# Patient Record
Sex: Female | Born: 1935 | Race: Black or African American | Hispanic: No | Smoking: Former smoker
Health system: Southern US, Community
[De-identification: ages and names within clinical notes are randomized; demographics above are authoritative.]

## PROBLEM LIST (undated history)

## (undated) DIAGNOSIS — F419 Anxiety disorder, unspecified: Secondary | ICD-10-CM

## (undated) DIAGNOSIS — Z5189 Encounter for other specified aftercare: Secondary | ICD-10-CM

## (undated) DIAGNOSIS — I10 Essential (primary) hypertension: Secondary | ICD-10-CM

## (undated) DIAGNOSIS — E785 Hyperlipidemia, unspecified: Secondary | ICD-10-CM

## (undated) DIAGNOSIS — R51 Headache: Secondary | ICD-10-CM

## (undated) DIAGNOSIS — K2970 Gastritis, unspecified, without bleeding: Secondary | ICD-10-CM

## (undated) DIAGNOSIS — J449 Chronic obstructive pulmonary disease, unspecified: Secondary | ICD-10-CM

## (undated) DIAGNOSIS — I2510 Atherosclerotic heart disease of native coronary artery without angina pectoris: Secondary | ICD-10-CM

## (undated) DIAGNOSIS — D649 Anemia, unspecified: Secondary | ICD-10-CM

## (undated) DIAGNOSIS — T7840XA Allergy, unspecified, initial encounter: Secondary | ICD-10-CM

## (undated) DIAGNOSIS — M1990 Unspecified osteoarthritis, unspecified site: Secondary | ICD-10-CM

## (undated) DIAGNOSIS — E876 Hypokalemia: Secondary | ICD-10-CM

## (undated) DIAGNOSIS — H269 Unspecified cataract: Secondary | ICD-10-CM

## (undated) DIAGNOSIS — D126 Benign neoplasm of colon, unspecified: Secondary | ICD-10-CM

## (undated) DIAGNOSIS — I5189 Other ill-defined heart diseases: Secondary | ICD-10-CM

## (undated) HISTORY — DX: Allergy, unspecified, initial encounter: T78.40XA

## (undated) HISTORY — DX: Headache: R51

## (undated) HISTORY — DX: Anemia, unspecified: D64.9

## (undated) HISTORY — DX: Unspecified cataract: H26.9

## (undated) HISTORY — DX: Benign neoplasm of colon, unspecified: D12.6

## (undated) HISTORY — PX: PARTIAL HYSTERECTOMY: SHX80

## (undated) HISTORY — DX: Encounter for other specified aftercare: Z51.89

## (undated) HISTORY — PX: CARPAL TUNNEL RELEASE: SHX101

## (undated) HISTORY — PX: UMBILICAL HERNIA REPAIR: SHX196

## (undated) HISTORY — PX: GALLBLADDER SURGERY: SHX652

## (undated) HISTORY — PX: OTHER SURGICAL HISTORY: SHX169

## (undated) HISTORY — DX: Chronic obstructive pulmonary disease, unspecified: J44.9

## (undated) HISTORY — DX: Essential (primary) hypertension: I10

## (undated) HISTORY — DX: Anxiety disorder, unspecified: F41.9

## (undated) HISTORY — DX: Unspecified osteoarthritis, unspecified site: M19.90

## (undated) HISTORY — DX: Hyperlipidemia, unspecified: E78.5

---

## 1998-05-23 ENCOUNTER — Ambulatory Visit (HOSPITAL_BASED_OUTPATIENT_CLINIC_OR_DEPARTMENT_OTHER): Admission: RE | Admit: 1998-05-23 | Discharge: 1998-05-23 | Payer: Self-pay | Admitting: Orthopedic Surgery

## 1998-07-09 ENCOUNTER — Ambulatory Visit (HOSPITAL_BASED_OUTPATIENT_CLINIC_OR_DEPARTMENT_OTHER): Admission: RE | Admit: 1998-07-09 | Discharge: 1998-07-09 | Payer: Self-pay | Admitting: Orthopedic Surgery

## 2001-01-11 ENCOUNTER — Ambulatory Visit (HOSPITAL_COMMUNITY): Admission: RE | Admit: 2001-01-11 | Discharge: 2001-01-11 | Payer: Self-pay | Admitting: Family Medicine

## 2001-01-11 ENCOUNTER — Encounter: Payer: Self-pay | Admitting: Family Medicine

## 2001-08-17 ENCOUNTER — Encounter: Payer: Self-pay | Admitting: Emergency Medicine

## 2001-08-17 ENCOUNTER — Inpatient Hospital Stay (HOSPITAL_COMMUNITY): Admission: EM | Admit: 2001-08-17 | Discharge: 2001-08-25 | Payer: Self-pay | Admitting: Emergency Medicine

## 2001-10-13 ENCOUNTER — Encounter (HOSPITAL_COMMUNITY): Admission: RE | Admit: 2001-10-13 | Discharge: 2001-11-12 | Payer: Self-pay | Admitting: Cardiology

## 2001-11-14 ENCOUNTER — Encounter (HOSPITAL_COMMUNITY): Admission: RE | Admit: 2001-11-14 | Discharge: 2001-12-14 | Payer: Self-pay | Admitting: Cardiology

## 2001-12-14 ENCOUNTER — Encounter (HOSPITAL_COMMUNITY): Admission: RE | Admit: 2001-12-14 | Discharge: 2002-01-13 | Payer: Self-pay | Admitting: Cardiology

## 2002-01-18 ENCOUNTER — Encounter (HOSPITAL_COMMUNITY): Admission: RE | Admit: 2002-01-18 | Discharge: 2002-02-17 | Payer: Self-pay | Admitting: Cardiology

## 2002-03-02 HISTORY — PX: CHOLECYSTECTOMY: SHX55

## 2002-09-20 ENCOUNTER — Inpatient Hospital Stay (HOSPITAL_COMMUNITY): Admission: EM | Admit: 2002-09-20 | Discharge: 2002-09-23 | Payer: Self-pay | Admitting: Emergency Medicine

## 2002-09-20 ENCOUNTER — Encounter: Payer: Self-pay | Admitting: Emergency Medicine

## 2002-09-20 ENCOUNTER — Encounter: Payer: Self-pay | Admitting: Family Medicine

## 2002-09-21 ENCOUNTER — Encounter: Payer: Self-pay | Admitting: *Deleted

## 2002-09-22 ENCOUNTER — Encounter: Payer: Self-pay | Admitting: General Surgery

## 2002-09-25 ENCOUNTER — Observation Stay (HOSPITAL_COMMUNITY): Admission: RE | Admit: 2002-09-25 | Discharge: 2002-09-26 | Payer: Self-pay | Admitting: General Surgery

## 2003-07-10 ENCOUNTER — Ambulatory Visit (HOSPITAL_COMMUNITY): Admission: RE | Admit: 2003-07-10 | Discharge: 2003-07-10 | Payer: Self-pay | Admitting: Family Medicine

## 2003-10-27 ENCOUNTER — Emergency Department (HOSPITAL_COMMUNITY): Admission: EM | Admit: 2003-10-27 | Discharge: 2003-10-27 | Payer: Self-pay | Admitting: Emergency Medicine

## 2003-10-27 ENCOUNTER — Inpatient Hospital Stay (HOSPITAL_COMMUNITY): Admission: EM | Admit: 2003-10-27 | Discharge: 2003-10-30 | Payer: Self-pay | Admitting: Cardiology

## 2003-11-13 ENCOUNTER — Encounter (HOSPITAL_COMMUNITY): Admission: RE | Admit: 2003-11-13 | Discharge: 2003-11-30 | Payer: Self-pay | Admitting: Cardiology

## 2003-12-03 ENCOUNTER — Encounter (HOSPITAL_COMMUNITY): Admission: RE | Admit: 2003-12-03 | Discharge: 2004-01-02 | Payer: Self-pay | Admitting: Cardiology

## 2004-02-06 ENCOUNTER — Encounter (HOSPITAL_COMMUNITY): Admission: RE | Admit: 2004-02-06 | Discharge: 2004-02-29 | Payer: Self-pay | Admitting: Cardiology

## 2004-02-15 ENCOUNTER — Ambulatory Visit: Payer: Self-pay | Admitting: Cardiology

## 2004-08-26 ENCOUNTER — Ambulatory Visit: Payer: Self-pay | Admitting: *Deleted

## 2004-09-01 ENCOUNTER — Ambulatory Visit: Payer: Self-pay | Admitting: *Deleted

## 2004-09-01 ENCOUNTER — Encounter (HOSPITAL_COMMUNITY): Admission: RE | Admit: 2004-09-01 | Discharge: 2004-10-01 | Payer: Self-pay | Admitting: *Deleted

## 2004-09-08 ENCOUNTER — Ambulatory Visit: Payer: Self-pay | Admitting: Cardiology

## 2004-10-20 ENCOUNTER — Ambulatory Visit: Payer: Self-pay | Admitting: *Deleted

## 2006-11-22 ENCOUNTER — Emergency Department (HOSPITAL_COMMUNITY): Admission: EM | Admit: 2006-11-22 | Discharge: 2006-11-22 | Payer: Self-pay | Admitting: Emergency Medicine

## 2007-01-04 ENCOUNTER — Ambulatory Visit: Payer: Self-pay | Admitting: Cardiology

## 2007-01-13 ENCOUNTER — Ambulatory Visit (HOSPITAL_COMMUNITY): Admission: RE | Admit: 2007-01-13 | Discharge: 2007-01-13 | Payer: Self-pay | Admitting: Family Medicine

## 2008-03-02 ENCOUNTER — Emergency Department (HOSPITAL_COMMUNITY): Admission: EM | Admit: 2008-03-02 | Discharge: 2008-03-02 | Payer: Self-pay | Admitting: Emergency Medicine

## 2008-03-12 ENCOUNTER — Ambulatory Visit: Payer: Self-pay | Admitting: Cardiology

## 2008-04-17 ENCOUNTER — Encounter (INDEPENDENT_AMBULATORY_CARE_PROVIDER_SITE_OTHER): Payer: Self-pay | Admitting: *Deleted

## 2008-04-17 LAB — CONVERTED CEMR LAB
Albumin: 4.2 g/dL
Alkaline Phosphatase: 61 units/L
BUN: 16 mg/dL
CO2: 22 meq/L
Chloride: 104 meq/L
Creatinine, Ser: 0.65 mg/dL
Potassium: 3.8 meq/L
Sodium: 140 meq/L

## 2008-05-18 ENCOUNTER — Ambulatory Visit (HOSPITAL_COMMUNITY): Admission: RE | Admit: 2008-05-18 | Discharge: 2008-05-18 | Payer: Self-pay | Admitting: Family Medicine

## 2008-07-30 ENCOUNTER — Emergency Department (HOSPITAL_COMMUNITY): Admission: EM | Admit: 2008-07-30 | Discharge: 2008-07-30 | Payer: Self-pay | Admitting: Emergency Medicine

## 2008-10-16 ENCOUNTER — Encounter (INDEPENDENT_AMBULATORY_CARE_PROVIDER_SITE_OTHER): Payer: Self-pay | Admitting: *Deleted

## 2008-10-16 LAB — CONVERTED CEMR LAB
Albumin: 4.2 g/dL
BUN: 17 mg/dL
Chloride: 102 meq/L
Creatinine, Ser: 0.96 mg/dL
Glucose, Bld: 134 mg/dL
HDL: 45 mg/dL
MCV: 98.6 fL
Platelets: 232 10*3/uL
Triglycerides: 129 mg/dL
WBC: 4.4 10*3/uL

## 2008-12-25 ENCOUNTER — Ambulatory Visit (HOSPITAL_COMMUNITY): Admission: RE | Admit: 2008-12-25 | Discharge: 2008-12-25 | Payer: Self-pay | Admitting: Family Medicine

## 2009-03-07 ENCOUNTER — Telehealth (INDEPENDENT_AMBULATORY_CARE_PROVIDER_SITE_OTHER): Payer: Self-pay

## 2009-05-22 DIAGNOSIS — R51 Headache: Secondary | ICD-10-CM

## 2009-05-22 DIAGNOSIS — F419 Anxiety disorder, unspecified: Secondary | ICD-10-CM

## 2009-05-22 DIAGNOSIS — I2510 Atherosclerotic heart disease of native coronary artery without angina pectoris: Secondary | ICD-10-CM | POA: Insufficient documentation

## 2009-05-22 DIAGNOSIS — I10 Essential (primary) hypertension: Secondary | ICD-10-CM

## 2009-05-22 DIAGNOSIS — Z9861 Coronary angioplasty status: Secondary | ICD-10-CM

## 2009-05-22 DIAGNOSIS — F329 Major depressive disorder, single episode, unspecified: Secondary | ICD-10-CM

## 2009-05-22 DIAGNOSIS — E785 Hyperlipidemia, unspecified: Secondary | ICD-10-CM

## 2009-05-22 DIAGNOSIS — R519 Headache, unspecified: Secondary | ICD-10-CM | POA: Insufficient documentation

## 2009-05-22 DIAGNOSIS — E782 Mixed hyperlipidemia: Secondary | ICD-10-CM | POA: Insufficient documentation

## 2009-07-15 ENCOUNTER — Encounter (INDEPENDENT_AMBULATORY_CARE_PROVIDER_SITE_OTHER): Payer: Self-pay | Admitting: *Deleted

## 2009-07-16 ENCOUNTER — Ambulatory Visit: Payer: Self-pay | Admitting: Cardiology

## 2009-07-16 ENCOUNTER — Encounter (INDEPENDENT_AMBULATORY_CARE_PROVIDER_SITE_OTHER): Payer: Self-pay | Admitting: *Deleted

## 2009-07-23 ENCOUNTER — Encounter: Payer: Self-pay | Admitting: Cardiology

## 2009-07-31 ENCOUNTER — Encounter (INDEPENDENT_AMBULATORY_CARE_PROVIDER_SITE_OTHER): Payer: Self-pay | Admitting: *Deleted

## 2009-07-31 DIAGNOSIS — E876 Hypokalemia: Secondary | ICD-10-CM

## 2009-07-31 LAB — CONVERTED CEMR LAB
ALT: 11 units/L (ref 0–35)
AST: 17 units/L (ref 0–37)
Albumin: 4.1 g/dL (ref 3.5–5.2)
CO2: 27 meq/L (ref 19–32)
Glucose, Bld: 107 mg/dL — ABNORMAL HIGH (ref 70–99)
LDL Cholesterol: 93 mg/dL (ref 0–99)
Total Bilirubin: 0.7 mg/dL (ref 0.3–1.2)
Total Protein: 7.2 g/dL (ref 6.0–8.3)
Triglycerides: 100 mg/dL (ref <150)

## 2009-08-07 ENCOUNTER — Encounter (INDEPENDENT_AMBULATORY_CARE_PROVIDER_SITE_OTHER): Payer: Self-pay | Admitting: *Deleted

## 2009-08-26 ENCOUNTER — Encounter (INDEPENDENT_AMBULATORY_CARE_PROVIDER_SITE_OTHER): Payer: Self-pay | Admitting: *Deleted

## 2009-08-26 LAB — CONVERTED CEMR LAB
BUN: 14 mg/dL (ref 6–23)
CO2: 30 meq/L (ref 19–32)
Creatinine, Ser: 0.8 mg/dL (ref 0.40–1.20)
Potassium: 3.4 meq/L — ABNORMAL LOW (ref 3.5–5.3)
Sodium: 143 meq/L (ref 135–145)

## 2010-02-27 ENCOUNTER — Encounter (INDEPENDENT_AMBULATORY_CARE_PROVIDER_SITE_OTHER): Payer: Self-pay | Admitting: *Deleted

## 2010-03-23 ENCOUNTER — Encounter: Payer: Self-pay | Admitting: Family Medicine

## 2010-04-03 NOTE — Progress Notes (Signed)
Summary: Refills/Pt. must have ov to continue meds, pt. aware.  Medications Added KLOR-CON M20 20 MEQ CR-TABS (POTASSIUM CHLORIDE CRYS CR) Take 1 tablet by mouth three times a day       Phone Note Call from Patient   Caller: Patient Reason for Call: Refill Medication Summary of Call: Pt needs refill for Klor-Con three times a day called to [facility] in [place] Initial call taken by: Raechel Ache [facility],  March 07, 2009 3:15 PM  Follow-up for Phone Call        Patient is due for f/u. She states she will call back to set up appt. when she feels better. I advised her that I will refill Klor-Con for 30 day supply and that in the mean time to call and make appt. She states she understands info. given.  Follow-up by: Larita Fife Via LPN,  March 07, 2009 4:18 PM    New/Updated Medications: KLOR-CON M20 20 MEQ CR-TABS (POTASSIUM CHLORIDE CRYS CR) Take 1 tablet by mouth three times a day Prescriptions: KLOR-CON M20 20 MEQ CR-TABS (POTASSIUM CHLORIDE CRYS CR) Take 1 tablet by mouth three times a day  #90 x 0   Entered by:   Larita Fife Via LPN   Authorized by:   Kathlen Brunswick, MD, [place]   Signed by:   Larita Fife Via LPN on 04/54/0981   Method used:   Electronically to        [facility] (retail)       [address]       [place], [place]  [address]       Ph: [phone]       Fax: [phone]   RxID:   [id number]

## 2010-04-03 NOTE — Assessment & Plan Note (Signed)
Summary: F1Y  Medications Added KLOR-CON M20 20 MEQ CR-TABS (POTASSIUM CHLORIDE CRYS CR) Take 1 tablet by mouth two times a day TOPROL XL 50 MG XR24H-TAB (METOPROLOL SUCCINATE) take 1 tab daily ASPIR-LOW 81 MG TBEC (ASPIRIN) take 1 tab daily MAXZIDE 75-50 MG TABS (TRIAMTERENE-HCTZ) Take 1 tablet by mouth once a day AMLODIPINE BESYLATE 10 MG TABS (AMLODIPINE BESYLATE) Take one tablet by mouth daily ZOCOR 20 MG TABS (SIMVASTATIN) Take 1 tablet by mouth once a day      Allergies Added: NKDA  Visit Type:  Follow-up Primary Provider:  Dr. [name]   History of Present Illness: Ms. Cindy Brandt returns to the office as scheduled for continued assessment and treatment of coronary disease and cardiovascular risk factors, now 6 years following percutaneous intervention for a non-ST segment elevation myocardial infarction.  She remains asymptomatic from a cardiovascular standpoint despite maintaining a fairly active lifestyle including exercising on a treadmill.  Control blood pressure has been good.  She is unaware of any recent lipid profile determinations.   Since her last office visit, she has been evaluated in the emergency department on 2 occasions. Hospital records were obtained and reviewed.   In January of 2010, she presented with weakness.  She was found to have a urinary tract infection.  She was seen again in May of last year with chest discomfort.  Bronchitis was diagnosed, and she subsequently improved after treatment with antibiotics.  Current Medications (verified): 1)  Klor-Con M20 20 Meq Cr-Tabs (Potassium Chloride Crys Cr) .... Take 1 Tablet By Mouth Two Times A Day 2)  Toprol Xl 50 Mg Xr24h-Tab (Metoprolol Succinate) .... Take 1 Tab Daily 3)  Aspir-Low 81 Mg Tbec (Aspirin) .... Take 1 Tab Daily 4)  Maxzide 75-50 Mg Tabs (Triamterene-Hctz) .... Take 1 Tablet By Mouth Once A Day 5)  Amlodipine Besylate 10 Mg Tabs (Amlodipine Besylate) .... Take One Tablet By Mouth Daily 6)   Zocor 20 Mg Tabs (Simvastatin) .... Take 1 Tablet By Mouth Once A Day  Allergies (verified): No Known Drug Allergies  Past History:  PMH, FH, and Social History reviewed and updated.  Review of Systems       See history of present illness.  Vital Signs:  Patient profile:   77 year old female Height:      61 inches Weight:      162 pounds BMI:     30.72 Pulse rate:   75 / minute BP sitting:   141 / 88  (right arm)  Vitals Entered By: Dreama Saa, CNA (Jul 16, 2009 1:35 PM)  Physical Exam  General:    Overweight; well developed; no acute distress:   Neck-No JVD; no carotid bruits: Lungs-No tachypnea, no rales; no rhonchi; no wheezes: Cardiovascular-normal PMI; normal S1 and S2; minimal systolic murmur Abdomen-BS normal; soft and non-tender without masses or organomegaly:  Musculoskeletal-No deformities, no cyanosis or clubbing: Neurologic-Normal cranial nerves; symmetric strength and tone:  Skin-Warm; numerous pigmented skin tags over the upper torso and neck Extremities-Nl distal pulses; no edema:     Impression & Recommendations:  Problem # 1:  ATHEROSCLEROTIC CARDIOVASCULAR DISEASE (ICD-429.2) She continues to do well from a cardiovascular standpoint with no symptoms to suggest myocardial ischemia.  Current medications will be continued.  There has been no apparent adverse effect of discontinuing Plavix at her previous visit.  Problem # 2:  HYPERLIPIDEMIA (ICD-272.4) Most recent lipid profile available to me was nearly one year ago.  This study will be repeated along  with a complete metabolic profile.  Problem # 3:  HYPERTENSION (ICD-401.9) Blood pressure is fairly good at this visit.  Patient reports that systolics are generally in the 130s at home, and that she follows blood pressure closely.  She does not appear to require additional pharmacologic agents.  I did caution her regarding further weight gain and have advised weight loss, if possible.  I will plan to  see this nice woman again in one year.  Other Orders: Future Orders: T-Lipid Profile ([id number]) ... 07/22/2009 T-Comprehensive Metabolic Panel [id number]) ... 07/22/2009  Patient Instructions: 1)  Your physician recommends that you schedule a follow-up appointment in: 1 year Prescriptions: KLOR-CON M20 20 MEQ CR-TABS (POTASSIUM CHLORIDE CRYS CR) Take 1 tablet by mouth two times a day  #60 x 6   Entered by:   Teressa Lower RN   Authorized by:   Kathlen Brunswick, MD, [facility]   Signed by:   Teressa Lower RN on 07/16/2009   Method used:   Electronically to        [facility] (retail)       [address]       [place], [place]  [address]       Ph: [phone]       Fax: [phone]   RxID:   [id number]

## 2010-04-03 NOTE — Miscellaneous (Signed)
Summary: CMP,LIPIDS,04/17/2008  Clinical Lists Changes  Observations: Added new observation of CALCIUM: 9.2 mg/dL (16/12/9602 54:09) Added new observation of ALBUMIN: 4.2 g/dL (81/19/1478 29:56) Added new observation of PROTEIN, TOT: 7.5 g/dL (21/30/8657 84:69) Added new observation of SGPT (ALT): 18 units/L (10/16/2008 10:40) Added new observation of SGOT (AST): 21 units/L (10/16/2008 10:40) Added new observation of ALK PHOS: 72 units/L (10/16/2008 10:40) Added new observation of CREATININE: 0.96 mg/dL (62/95/2841 32:44) Added new observation of BUN: 17 mg/dL (03/04/7251 66:44) Added new observation of BG RANDOM: 134 mg/dL (03/47/4259 56:38) Added new observation of CO2 PLSM/SER: 25 meq/L (10/16/2008 10:40) Added new observation of CL SERUM: 102 meq/L (10/16/2008 10:40) Added new observation of K SERUM: 3.4 meq/L (10/16/2008 10:40) Added new observation of NA: 142 meq/L (10/16/2008 10:40) Added new observation of LDL: 103 mg/dL (75/64/3329 51:88) Added new observation of HDL: 45 mg/dL (41/66/0630 16:01) Added new observation of TRIGLYC TOT: 129 mg/dL (09/32/3557 32:20) Added new observation of CHOLESTEROL: 174 mg/dL (25/42/7062 37:62) Added new observation of PLATELETK/UL: 232 K/uL (10/16/2008 10:40) Added new observation of MCV: 98.6 fL (10/16/2008 10:40) Added new observation of HCT: 41.0 % (10/16/2008 10:40) Added new observation of HGB: 13.7 g/dL (83/15/1761 60:73) Added new observation of WBC COUNT: 4.4 10*3/microliter (10/16/2008 10:40) Added new observation of CALCIUM: 9.5 mg/dL (71/07/2692 85:46) Added new observation of ALBUMIN: 4.2 g/dL (27/05/5007 38:18) Added new observation of PROTEIN, TOT: 7.1 g/dL (29/93/7169 67:89) Added new observation of SGPT (ALT): 17 units/L (04/17/2008 10:40) Added new observation of SGOT (AST): 18 units/L (04/17/2008 10:40) Added new observation of ALK PHOS: 61 units/L (04/17/2008 10:40) Added new observation of CREATININE: 0.65 mg/dL  (38/11/1749 02:58) Added new observation of BUN: 16 mg/dL (52/77/8242 35:36) Added new observation of BG RANDOM: 111 mg/dL (14/43/1540 08:67) Added new observation of CO2 PLSM/SER: 22 meq/L (04/17/2008 10:40) Added new observation of CL SERUM: 104 meq/L (04/17/2008 10:40) Added new observation of K SERUM: 3.8 meq/L (04/17/2008 10:40) Added new observation of NA: 140 meq/L (04/17/2008 10:40) Added new observation of LDL: 92 mg/dL (61/95/0932 67:12) Added new observation of HDL: 50 mg/dL (45/80/9983 38:25) Added new observation of TRIGLYC TOT: 90 mg/dL (05/39/7673 41:93) Added new observation of CHOLESTEROL: 160 mg/dL (79/04/4095 35:32)

## 2010-04-03 NOTE — Letter (Signed)
Summary: [place] Future Lab Work Engineer, agricultural at [employer]  [address] [place] [address]   Phone: [phone]  Fax: [phone]     August 26, 2009 MRN: [id number]   Cindy Brandt [address] [place]  [address]      YOUR LAB WORK IS DUE   November 26, 2009  Please go to [facility], located across the street from [facility] on the second floor.  Hours are Monday - Friday 7am until 7:30pm         Saturday 8am until 12noon    __  DO NOT EAT OR DRINK AFTER MIDNIGHT EVENING PRIOR TO LABWORK  _X_ YOUR LABWORK IS NOT FASTING --YOU MAY EAT PRIOR TO LABWORK

## 2010-04-03 NOTE — Letter (Signed)
Summary: [place] Results Engineer, agricultural at [facility]  [address] [place] [address]   Phone: [phone]  Fax: [phone]      July 31, 2009 MRN: [id number]   Cindy Brandt [address] [facility] [address], [place]  [address]   Dear [name],  Your test ordered by Selena Batten has been reviewed by your physician (or physician assistant) and was found to be normal or stable. Your physician (or physician assistant) felt no changes were needed at this time.  ____ Echocardiogram  ____ Cardiac Stress Test  _x___ Lab Work  ____ Peripheral vascular study of arms, legs or neck  ____ CT scan or X-ray  ____ Lung or Breathing test  ____ Other:  Please stop taking maxzide and change to chlorthalidone 25mg  daily, also begin lisinopril 20mg  daily.   Labwork scheduled for 3 and 6 weeks. Thank you,  Vibhav Waddill Allyne Gee RN    [place] Bing, MD, [facility] Arena.C.Gaylord Shih, MD, F.A.C.C Lewayne Bunting, MD, F.A.C.C Nona Dell, MD, F.A.C.C Charlton Haws, MD, [facility] Arena.C.C

## 2010-04-03 NOTE — Letter (Signed)
Summary: [place] Future Lab Work Engineer, agricultural at [facility]  [address] [place] [address]   Phone: [phone]  Fax: [phone]     Jul 16, 2009 MRN: [id number]   Cindy Brandt [address] [place], [place]  [address]      YOUR LAB WORK IS DUE   ___________May 23, 2011______________________________  Please go to [facility], located across the street from [facility] on the second floor.  Hours are Monday - Friday 7am until 7:30pm         Saturday 8am until 12noon    _x_  DO NOT EAT OR DRINK AFTER MIDNIGHT EVENING PRIOR TO LABWORK  __ YOUR LABWORK IS NOT FASTING --YOU MAY EAT PRIOR TO LABWORK

## 2010-04-03 NOTE — Letter (Signed)
Summary: [place] Future Lab Work Engineer, agricultural at [facility]  [address] [place] [address]   Phone: [phone]  Fax: [phone]     August 26, 2009 MRN: [id number]   Cindy Brandt [address] [place]  [address]      YOUR LAB WORK IS DUE   September 25, 2009  Please go to [facility], located across the street from [facility] on the second floor.  Hours are Monday - Friday 7am until 7:30pm         Saturday 8am until 12noon    __  DO NOT EAT OR DRINK AFTER MIDNIGHT EVENING PRIOR TO LABWORK  _X_ YOUR LABWORK IS NOT FASTING --YOU MAY EAT PRIOR TO LABWORK

## 2010-04-03 NOTE — Letter (Signed)
Summary: [place] Future Lab Work Engineer, agricultural at [employer]  [address] [place] [address]   Phone: [phone]  Fax: [phone]     February 27, 2010 MRN: [id number]   Cindy Brandt [address] [place]  [address]      YOUR LAB WORK IS DUE   March 04, 2010  Please go to [facility], located across the street from [facility] on the second floor.  Hours are Monday - Friday 7am until 7:30pm         Saturday 8am until 12noon      _X_ YOUR LABWORK IS NOT FASTING --YOU MAY EAT PRIOR TO LABWORK

## 2010-04-03 NOTE — Letter (Signed)
Summary: [place] Future Lab Work Engineer, agricultural at [facility]  [address], [place] [address]   Phone: [phone]  Fax: [phone]     August 07, 2009 MRN: [id number]   Cindy Brandt [address] [place], [place]  [address]      YOUR LAB WORK IS DUE   __________JULY 13, 2011_____________________________  Please go to [facility], located across the street from [facility] on the second floor.  Hours are Monday - Friday 7am until 7:30pm         Saturday 8am until 12noon    __  DO NOT EAT OR DRINK AFTER MIDNIGHT EVENING PRIOR TO LABWORK  _X_ YOUR LABWORK IS NOT FASTING --YOU MAY EAT PRIOR TO LABWORK

## 2010-04-03 NOTE — Letter (Signed)
Summary: [place] Results Engineer, agricultural at [facility]  [address], [place] [address]   Phone: [phone]  Fax: [phone]      August 26, 2009 MRN: [id number]   ARRIELLE MCGINN [address] [facility] [address] [place]  [address]   Dear Ms. Beg,  Your test ordered by Selena Batten has been reviewed by your physician (or physician assistant) and was found to be normal or stable. Your physician (or physician assistant) felt no changes were needed at this time.  ____ Echocardiogram  ____ Cardiac Stress Test  _x___ Lab Work  ____ Peripheral vascular study of arms, legs or neck  ____ CT scan or X-ray  ____ Lung or Breathing test  ____ Other: Please add spironolactone 25mg  daily to your medication list and decrease your potassium to 1 tablet daily.  We will repeat labwork in 1 and 3 months, enclosed is a copy of your labwork for your records, per Dr. Dietrich Pates.  Thank you, Tammy Allyne Gee RN    [place] Bing, MD, [facility] Arena.C.Gaylord Shih, MD, F.A.C.C Lewayne Bunting, MD, F.A.C.C Nona Dell, MD, F.A.C.C Charlton Haws, MD, [facility] Arena.C.C

## 2010-04-11 ENCOUNTER — Encounter: Payer: Self-pay | Admitting: Cardiology

## 2010-04-11 LAB — CONVERTED CEMR LAB
AST: 18 units/L
Alkaline Phosphatase: 57 units/L
BUN: 17 mg/dL
Creatinine, Ser: 0.89 mg/dL
Potassium: 3.8 meq/L
Total Protein: 7 g/dL

## 2010-04-23 NOTE — Miscellaneous (Signed)
Summary: CMP  Clinical Lists Changes  Observations: Added new observation of CALCIUM: 8.8 mg/dL (16/12/9602 54:09) Added new observation of ALBUMIN: 4.1 g/dL (81/19/1478 29:56) Added new observation of PROTEIN, TOT: 7.0 g/dL (21/30/8657 84:69) Added new observation of SGPT (ALT): 15 units/L (04/11/2010 10:27) Added new observation of SGOT (AST): 18 units/L (04/11/2010 10:27) Added new observation of ALK PHOS: 57 units/L (04/11/2010 10:27) Added new observation of BILI DIRECT: Total Bili 0.7 mg/dL (62/95/2841 32:44) Added new observation of CREATININE: 0.89 mg/dL (03/04/7251 66:44) Added new observation of BUN: 17 mg/dL (03/47/4259 56:38) Added new observation of BG RANDOM: 120 mg/dL (75/64/3329 51:88) Added new observation of CO2 PLSM/SER: 30 meq/L (04/11/2010 10:27) Added new observation of CL SERUM: 102 meq/L (04/11/2010 10:27) Added new observation of K SERUM: 3.8 meq/L (04/11/2010 10:27) Added new observation of NA: 140 meq/L (04/11/2010 10:27)  Appended Document: cmp,cbc,lipid profile,    Clinical Lists Changes  Observations: Added new observation of CALCIUM: 8.8 mg/dL (41/66/0630 16:01) Added new observation of ALBUMIN: 4.1 g/dL (09/32/3557 32:20) Added new observation of PROTEIN, TOT: 7.0 g/dL (25/42/7062 37:62) Added new observation of SGPT (ALT): 15 units/L (04/11/2010 14:41) Added new observation of SGOT (AST): 18 units/L (04/11/2010 14:41) Added new observation of ALK PHOS: 57 units/L (04/11/2010 14:41) Added new observation of BILI DIRECT: total bili   0.7 mg/dL (83/15/1761 60:73) Added new observation of GFR AA: >60 mL/min/1.70m2 (04/11/2010 14:41) Added new observation of GFR: >60 mL/min (04/11/2010 14:41) Added new observation of CREATININE: 0.81 mg/dL (71/07/2692 85:46) Added new observation of BUN: 17 mg/dL (27/05/5007 38:18) Added new observation of BG RANDOM: 120 mg/dL (29/93/7169 67:89) Added new observation of CO2 PLSM/SER: 30 meq/L (04/11/2010  14:41) Added new observation of CL SERUM: 102 meq/L (04/11/2010 14:41) Added new observation of K SERUM: 3.8 meq/L (04/11/2010 14:41) Added new observation of NA: 140 meq/L (04/11/2010 14:41) Added new observation of LDL: 108 mg/dL (38/11/1749 02:58) Added new observation of HDL: 48 mg/dL (52/77/8242 35:36) Added new observation of TRIGLYC TOT: 100 mg/dL (14/43/1540 08:67) Added new observation of CHOLESTEROL: 176 mg/dL (61/95/0932 67:12) Added new observation of PLATELETK/UL: 232 K/uL (04/11/2010 14:41) Added new observation of MCV: 98.6 fL (04/11/2010 14:41) Added new observation of HCT: 41.4 % (04/11/2010 14:41) Added new observation of HGB: 14.1 g/dL (45/80/9983 38:25) Added new observation of WBC COUNT: 5.1 10*3/microliter (04/11/2010 14:41)

## 2010-06-10 LAB — CBC
Hemoglobin: 13 g/dL (ref 12.0–15.0)
MCHC: 36 g/dL (ref 30.0–36.0)
MCV: 98.8 fL (ref 78.0–100.0)
Platelets: 240 10*3/uL (ref 150–400)
RDW: 12.2 % (ref 11.5–15.5)

## 2010-06-10 LAB — POCT CARDIAC MARKERS
CKMB, poc: 1.1 ng/mL (ref 1.0–8.0)
Myoglobin, poc: 77.8 ng/mL (ref 12–200)

## 2010-06-16 LAB — CBC
MCHC: 34.4 g/dL (ref 30.0–36.0)
Platelets: 248 10*3/uL (ref 150–400)
WBC: 6.4 10*3/uL (ref 4.0–10.5)

## 2010-06-16 LAB — URINALYSIS, ROUTINE W REFLEX MICROSCOPIC
Hgb urine dipstick: NEGATIVE
Leukocytes, UA: NEGATIVE
Protein, ur: 30 mg/dL — AB
Urobilinogen, UA: 0.2 mg/dL (ref 0.0–1.0)

## 2010-06-16 LAB — COMPREHENSIVE METABOLIC PANEL
Albumin: 3.4 g/dL — ABNORMAL LOW (ref 3.5–5.2)
BUN: 15 mg/dL (ref 6–23)
CO2: 30 mEq/L (ref 19–32)
Chloride: 103 mEq/L (ref 96–112)
Creatinine, Ser: 1.09 mg/dL (ref 0.4–1.2)
GFR calc non Af Amer: 49 mL/min — ABNORMAL LOW (ref >60)
Total Bilirubin: 0.6 mg/dL (ref 0.3–1.2)

## 2010-06-16 LAB — URINE CULTURE: Colony Count: NO GROWTH

## 2010-06-16 LAB — POCT CARDIAC MARKERS
CKMB, poc: 1.4 ng/mL (ref 1.0–8.0)
CKMB, poc: 1.9 ng/mL (ref 1.0–8.0)
Myoglobin, poc: 102 ng/mL (ref 12–200)

## 2010-06-16 LAB — URINE MICROSCOPIC-ADD ON

## 2010-06-16 LAB — BRAIN NATRIURETIC PEPTIDE: Pro B Natriuretic peptide (BNP): <30 pg/mL (ref 0.0–100.0)

## 2010-06-16 LAB — DIFFERENTIAL
Basophils Absolute: 0 10*3/uL (ref 0.0–0.1)
Lymphocytes Relative: 29 % (ref 12–46)
Neutro Abs: 3.8 10*3/uL (ref 1.7–7.7)

## 2010-06-16 LAB — LIPASE, BLOOD: Lipase: 18 U/L (ref 11–59)

## 2010-07-15 NOTE — Letter (Signed)
March 12, 2008    [name], MD  [address]  [place], [place]  [address]   RE:  XOE, HOE  MRN:  [id number]  /  DOB:  18-Aug-1931   Dear Annette Stable:   Ms. Sailer returns to the office for continued assessment and treatment of  coronary artery disease and cardiovascular risk factors.  Since a non-Q  myocardial infarction in August 2005 treated with medical therapy, she  has done quite well.  She reports no cardiopulmonary symptoms.  She was  recently seen in the emergency department for symptoms of a urinary  tract infection.  Basic testing was generally good except that her  potassium remains on the low side at 3.5.  At the present time, she  feels perfectly well.   CURRENT MEDICATIONS:  1. Clopidogrel 75 mg daily whose expense has been somewhat of a      problem for her.  2. Toprol 50 mg daily.  3. Aspirin 81 mg daily.  4. Dyazide 37.5/25 mg daily.  5. Caduet 10/40 mg daily.  6. KCl 20 mEq b.i.d.   PHYSICAL EXAMINATION:  GENERAL:  Pleasant, somewhat overweight woman in  no acute distress.  VITAL SIGNS:  The weight is 159, 5 pounds more than last year.  Blood  pressure 115/75, heart rate 68 and regular, respirations 14.  NECK:  No jugular venous distention; normal carotid upstrokes without  bruits.  LUNGS:  Clear.  CARDIAC:  Normal first and second heart sounds; modest systolic murmur.  ABDOMEN:  Soft and nontender; no organomegaly.  EXTREMITIES:  No edema; distal pulses intact.   Urine culture from March 02, 2008, actually showed no growth.  Cardiac  markers were obtained and were negative.  A complete metabolic profile  was normal except for glucose of 106 and the potassium value as noted  above.  Lipase and BNP were normal.  CBC was normal with a normal white  count.   IMPRESSION:  Ms. Brinkmeier is doing well from the cardiovascular standpoint.  It has now been more than 4 years since a non-ST segment elevation  myocardial infarction.  The role of clopidogrel is  minimal at this  point.  That medication will be discontinued.  We will check a lipid  profile.  Blood pressure control is excellent.  I will plan to see this  nice woman again in 1 year.    Sincerely,      [name]. Dietrich Pates, MD, [facility]  Electronically Signed    RMR/MedQ  DD: 03/12/2008  DT: 03/13/2008  Job #: [id number]

## 2010-07-15 NOTE — Letter (Signed)
January 04, 2007    Kirk Ruths, M.D.  [address]  [place], [place] [address]   RE:  Cindy, Brandt  MRN:  [id number]  /  DOB:  06-06-1931   Dear Annette Stable,   Cindy Brandt returns to the office following a two-year hiatus, for  continued assessment and treatment of coronary disease and  cardiovascular risk factors.  Since I last saw her, she has done quite  well.  She was seen in the emergency department a few months ago with  vague symptoms and diarrhea.  She was found to be hypokalemic, and felt  better after replacement.  She has done quite well since.  She reports  no dyspnea nor chest discomfort.  She is unaware of her cholesterol  status.  Chemistry profile has not been repeated since her emergency  department visit.   CURRENT MEDICATIONS INCLUDE:  1. Clopidogrel 75 mg daily.  2. Toprol 50 mg daily.  3. Aspirin 81 mg daily.  4. KCL 10 mEq daily.  5. Dyazide 37.5/25 mg daily.  6. Caduet 10/40 mg daily.   EXAM:  A pleasant woman, in no acute distress.  The weight is 154, one pound less than in July 2006.  Blood pressure  125/80, heart rate 75 and regular, respirations 16.  NECK:  No jugular venous distention, normal carotid upstrokes without  bruits.  SKIN:  Dozens of skin tags.  LUNGS:  Increased AP diameter; clear lung fields.  CARDIAC:  Normal first and second heart sounds; minimal systolic  ejection murmur.  ABDOMEN:  Soft and nontender.  No organomegaly.  EXTREMITIES:  No edema; normal distal pulses.   IMPRESSION:  Cindy Brandt is doing generally well.  A lipid profile and  chemistry profile will be obtained.  If results are good, she will  continue her current medications and return for reassessment in one  year.  She has received influenza vaccine this year.  We have no record  of prior pneumococcal vaccine administration.    Sincerely,      [name]. Dietrich Pates, MD, [facility]  Electronically Signed    RMR/MedQ  DD: 01/04/2007  DT: 01/05/2007  Job #: [id number]

## 2010-07-18 NOTE — Op Note (Signed)
NAME:  Cindy Brandt, Cindy Brandt                            ACCOUNT NO.:  [id number]   MEDICAL RECORD NO.:  [id number]                   PATIENT TYPE:  AMB   LOCATION:  DAY                                  FACILITY:  [facility]   PHYSICIAN:  Dalia Heading, M.D.               DATE OF BIRTH:  22-Nov-1931   DATE OF PROCEDURE:  09/25/2002  DATE OF DISCHARGE:                                 OPERATIVE REPORT   AGE:  76 years old.   PREOPERATIVE DIAGNOSIS:  Chronic cholecystitis.   POSTOPERATIVE DIAGNOSIS:  Chronic cholecystitis.   OPERATION/PROCEDURE:  Laparoscopic cholecystectomy.   SURGEON:  Dalia Heading, M.D.   ASSISTANT:  Bernerd Limbo. Leona Carry, M.D.   ANESTHESIA:  General endotracheal.   INDICATIONS FOR PROCEDURE:  The patient is a 77 year old black female who  presents with right upper quadrant pain secondary to chronic cholecystitis.  The risks and benefits of the procedure including bleeding, infection,  hepatobiliary injury, and the possibility of an open procedure were fully  explained to the patient, gaining informed consent.   DESCRIPTION OF PROCEDURE:  The patient was placed in the supine position.  After induction of general endotracheal anesthesia, the abdomen was prepped  and draped using the usual sterile technique with Betadine.  Surgical site  confirmation was performed.   A supraumbilical incision was made down to the fascia.  The Veress needle  was introduced into the abdominal cavity and confirmation of placement was  done using the saline drop test.  The abdomen was then insufflated to 16  mmHg pressure.  An 11 mm trocar was introduced into the abdominal cavity  under direct visualization without difficulty.  The patient was placed in  the reversed Trendelenburg position and an additional 11 mm trocar was  placed in the epigastric region and 5 mm trocars were placed in the right  upper quadrant, right flank regions.  The liver was inspected and noted to  be within normal  limits.  The gallbladder was retracted superiorly and  laterally. The dissection was begun around the infundibulum of the  gallbladder.  The cystic duct was first identified.  Its junction to the  infundibulum was fully identified.  Endo-clips were placed proximally and  distally on the cystic duct and the cystic duct was divided.  This was  likewise down of the cystic artery.  The gallbladder was then freed away  from the gallbladder fossa using Bovie electrocautery.  The gallbladder was  delivered through the epigastric trocar site using Bovie electrocautery  without difficulty.  The gallbladder was delivered through the epigastric  trocar site using and endo-catch bag.  The gallbladder fossa was inspected  and no abnormal bleeding or bile leakage was noted.  Surgicel was placed in  the gallbladder fossa.  The subhepatic space as well as the right hepatic  gutter were irrigated with normal saline.  All fluid and air  were then  evacuated from the abdominal cavity prior to removal of the trocars.   All wounds were irrigated with normal saline.  All wounds were injected with  0.5% Sensorcaine.  The supraumbilical fascia was reapproximated using an 0  Vicryl interrupted suture.  All skin incisions were closed using staples.  Betadine ointment and dry sterile dressing were applied.   All tape and needle counts were correct at the end of the procedure.  The  patient was extubated in the operating room, and went back to the recovery  room awake and in stable condition.   COMPLICATIONS:  None.   SPECIMENS:  Gallbladder.   ESTIMATED BLOOD LOSS:  Minimal.                                                Dalia Heading, M.D.    MAJ/MEDQ  D:  09/25/2002  T:  09/25/2002  Job:  [id number]   cc:   Kirk Ruths, M.D.  [address]  [place]  [place] [address]  Fax: [phone]

## 2010-07-18 NOTE — H&P (Signed)
NAME:  Cindy Brandt, GRANLUND                            ACCOUNT NO.:  [id number]   MEDICAL RECORD NO.:  [id number]                   PATIENT TYPE:  INP   LOCATION:  2001                                 FACILITY:  [facility]   PHYSICIAN:  Verne Grain, MD                DATE OF BIRTH:  10-03-1931   DATE OF ADMISSION:  10/27/2003  DATE OF DISCHARGE:                                HISTORY & PHYSICAL   PRIMARY CARDIOLOGIST:  Dr. Dietrich Pates   PRIMARY CARE PHYSICIAN:  Dr. Regino Schultze   CHIEF COMPLAINT:  Non ST elevation myocardial infarction.   HISTORY OF PRESENT ILLNESS:  This is a 77 year old female, transferred from  [facility] for further management of non ST elevation myocardial  infarction.  The patient has a history of hypertension, hyperlipidemia,  coronary artery disease status post non ST elevation myocardial infarction  in June, 2003 with cardiac catheterization revealing left main 40%, left  circumflex 70% mid followed by a small aneurysm, small right coronary artery  with 99%/70% feeding a small PDA with some left-to-right collaterals noted,  left ventricular ejection fraction well preserved (EF approximately 52%)  with cardiac catheterization complicated by retroperitoneal bleed, PCI  attempt to the right coronary artery by Dr. Juanda Chance was unsuccessful in his  attempt at revascularization and complicated by stent placement with  subsequent RCA dissection, repeat stent placement with some difficulty in  final incomplete revascularization with recommendation for high threshold  for any future intervention in this region (reason for no intervention in  the left circumflex is not clear on dictated report), status post adenosine  Cardiolite in July of 2004 with ejection fraction of 65% revealing infarct  in the posterobasal and inferior regions with only mild peri-infarct  ischemia which was treated medically.  The patient reports that she has done  well since her infarct in 2003.  She  does not have a regular exercise  routine however does do housework and reports taking no sublingual  nitroglycerin since her myocardial infarction in June of 2003.  This morning  however she noted awaking at 3 a.m. with a headache which is reportedly not  unusual for her as she has chronic headaches and that she treats with  Darvocet.  She reports taking a Darvocet and going back to bed but then  awaking at approximately 6 a.m. with 7 out of 10 chest pain accompanied by  nausea and diaphoresis.  The patient denied any radiation, denied shortness  of breath, however she did report that the chest discomfort was just like  the pain with her previous heart attack.  The patient took one sublingual  nitroglycerin with improvement in the pain and subsequent resolution en  route to [facility].  The patient's initial cardiac markers were negative,  however a third set was positive.  The patient's EKG was notable for T wave  inversions in leads 2, 3,  V2 and V3 with less than 1 mm ST segment  depressions in 2, 3 as well as V2 through V6.   PAST MEDICAL HISTORY:  1. Coronary artery disease status post non ST elevation myocardial     infarction (June, 2003) with cardiac catheterization revealing a left     main 40%, left circumflex 70% mid followed by a small aneurysm, small     right coronary artery with a 99%/70% feeding a small PDA with some left-     to-right collaterals noted, cath complicated by retroperitoneal bleed,     PCI attempt by Dr. Juanda Chance complicated by RCA dissection and incomplete     revascularization after deployment of 2 stents and notation by Dr. Juanda Chance     that some of the difficulty with the procedure surrounded difficulty with     the seating of the guide catheter which apparently was very difficult.     Adenosine Cardiolite (July, 2004) with ejection fraction of 65% and     posterobasal inferior infarct with mild peri-infarct ischemia treated     medically, echocardiogram  (July, 2004) with ejection fraction of 55%,     moderate left ventricular hypertrophy, mitral annulus calcification and     mild aortic calcification.  2. Status post cholecystectomy (July, 2004).  3. Hypertension.  4. Hyperlipidemia.  5. History of anxiety/degenerative joint disease/chronic pain/headaches.  6. Environmental allergies/allergic rhinitis (per patient).   ALLERGIES ADVERSE REACTIONS:  No known drug allergies.   CURRENT MEDICATIONS:  (Confirmed with patient's bag of medications that she  did bring to the hospital)  1. Ativan 1 mg p.o. q.6h p.r.n.  2. Maxzide 37.5/25 1 tablet p.o. daily.  3. K-Dur 10 mEq p.o. daily.  4. Darvocet 1 tablet p.o. q.6h p.r.n.  5. Aspirin 81 mg p.o. daily.  6. Toprol XL 50 mg p.o. daily.  7. Caduet 5/10 mg 1 tablet p.o. daily.   SOCIAL HISTORY:  The patient lives in [place] with her husband.  She does  not use tobacco nor alcohol.  She denies any illicit drug use in her past.  She has 5 children, 1 of whom died at a young age of pneumonia (75 years  old), the other 4 are alive and well.   FAMILY HISTORY:  Father died at age 75 with diabetes and coronary artery  disease.  Mother died at age 65 with diabetes and coronary artery disease.  A brother has a history of diabetes.   REVIEW OF SYSTEMS:  Essentially negative other than what was described in  the HPI. The patient does have chronic headaches which have not  significantly changed over the past few years.  She has nasal symptoms that  are attributed to her environment allergies/allergic rhinitis per her  report.  She did not report any fevers or chills.  She does not report any  acute alterations in auditory or visual acuity.  She does not report any  acute rash.  She does have chest pain as described in the HPI but denies  shortness of breath, dyspnea on exertion, orthopnea or PND.  She has no  presyncope or syncope.  She has no report of cough or wheezing.  She has no bowel or  bladder complaints.  Her neuropsychiatric status is stable with  chronic anxiety treated with p.r.n. Ativan as described above.  She has  nausea associated with her chest pain but otherwise had no GI  symptomatology, no vomiting, no diarrhea.  She has no heat or cold  intolerance, no skin or hair changes that she is aware of.  All other  systems are negative.   PHYSICAL EXAMINATION:  VITAL SIGNS:  Temperature 97.1, heart rate 71,  respiratory rate 18, blood pressure 107/66.  GENERAL:  The patient is well-appearing, in no apparent distress.  HEENT:  Normocephalic, atraumatic.  Extraocular eye movements are intact.  Oropharynx is pink and moist without lesions.  NECK:  Supple, there is no evidence of jugular venous distension.  CARDIOVASCULAR:  Regular S1, S2, there is no murmur.  LUNGS:  Clear to auscultation bilaterally.  SKIN:  Examination reveals no acute rash, in brief.  ABDOMEN:  Soft, nontender, nondistended with positive bowel sounds, mild  obesity.  EXTREMITIES:  Examination reveals no evidence of edema.  Distal pulses are  2+ and symmetric bilaterally.  NEURO EXAMINATION:  Grossly nonfocal.  The patient is able to move all 4  extremities without difficulty with strength and sensation grossly intact  throughout.   EKG:  Normal sinus rhythm at a rate of 72 with T wave inversions in leads 2,  3, and V2 as well as V3 with less than 1 mm segment depression in V2 through  V6 on [facility] EKG (no prior is available for comparison).   LABORATORY VALUES:  CK-MB 3.4, 3.8, 10.2.  Troponin I 0.06, 0.8, 0.39.  Myoglobin 489.  BUN 8, creatinine 0.8, potassium 2.8, calcium 8.8, glucose  124.  Hematocrit of 35, white blood cell count of 4.4, platelet count of  306,000.  AST 57, ALT 21, total bilirubin 0.7, alkaline phosphatase 64,  total protein 6.6, albumin 3.3.   ASSESSMENT AND PLAN:  A 77 year old female with hypertension,  hyperlipidemia, chronic headaches, anxiety, coronary artery  disease status  post non ST segment elevation myocardial infarction in June, 2003 with a  catheterization at that time notable for a left main of 40%, LAD of 30%, 70%  mid left circumflex followed by a small aneurysm and a small right coronary  artery that was 99%/70% feeding a small PDA with cardiac catheterization  complicated by retroperitoneal bleed and attempted RCA percutaneous coronary  intervention, complicated by right coronary artery dissection after 2 stents  placed with incomplete revascularization and difficulty, procedure noted  with difficult seating of the guide catheter (Dr. Juanda Chance) with  recommendations for having a high threshold for any further  revascularization in this right coronary artery territory, exact reasons for  no intervention in the mid left circumflex stenosis not described in the  cardiology cath note.  Ejection fraction well preserved, approximately 55 to  65% on previous examinations.  Non ST segment elevation myocardial infarction this morning accompanied by T wave inversions in lead 2, 2, V2  through V3 with less than 1 mm of ST segment depression in V2 through V6,  now pain free on a heparin drip.  Potassium 2.8.   1. Non ST segment elevation myocardial infarction.  Will treat patient with     optimal medical therapy including aspirin, heparin, Integrilin, Toprol,     Statin, Nitrol paste, and an ACE inhibitor and titrate medications as     tolerated while awaiting cardiac catheterization on Monday.  Will     continue to cycle cardiac markers to establish downward trend and follow     EKG and telemetry while hospitalized.  Will obtain a chest x-ray as there     was no report of a chest x-ray in [facility] records.  Will also check a  thyroid profile in light of the fact that patient described some     diaphoresis and feeling hot and cold in the recent past.   1. Hypertension.  Goal blood pressure less than 135/85.  Will attempt     control with  beta blockers and ACE inhibitors and titrate these agents up     as tolerated.  Will hold thiazide diuretic for now to allow upward     titration of ACE inhibitor as well as to allow potassium supplementation     in light of hypokalemia as mentioned above.  Also will hold Caduet     (amlodipine/atorvastatin) again to allow upward titration of ACE     inhibitor/beta blocker.   1. Hyperlipidemia.  Will check a lipid profile in the morning (goal LDL less     than 70, goal triglycerides less than 150).  Patient's liver function     tests at [facility] were [facility].  In light of patient's acute coronary     syndrome/non ST segment elevation myocardial infarction, will increase     does of atorvastatin to 80 mg p.o. q.h.s.   1. Hypokalemia (potassium equals 2.8).  Will supplement with 40 mEq of     potassium p.o. q.6h x4 and recheck potassium level in the morning.  Will     hold thiazide diuretic as described above to avoid any worsening of the     patient's hypokalemia.   1. Chronic pain/degenerative joint disease/headaches.  Will continue     Darvocet p.r.n. as previously prescribed as the patient states that this     medication regimen worked well for her.   1. History of anxiety. Will continue Ativan p.r.n. as previously prescribed     as the patient reports good efficacy with this medication.                                                Verne Grain, MD    DDH/MEDQ  D:  10/27/2003  T:  10/27/2003  Job:  [id number]   cc:   Estill Bing, M.D.

## 2010-07-18 NOTE — Procedures (Signed)
NAMEBRAILYN, Cindy Brandt                  ACCOUNT NO.:  [id number]   MEDICAL RECORD NO.:  [id number]           PATIENT TYPE:  REC   LOCATION:  RAD                           FACILITY:  [facility]   PHYSICIAN:  Olga Millers, M.D. LHCDATE OF BIRTH:  07/06/1931   DATE OF PROCEDURE:  09/01/2004  DATE OF DISCHARGE:                                    STRESS TEST   PROCEDURE:  Exercise Myoview.   HISTORY:  Ms. Hobdy is a 77 year old female with coronary artery disease  status post non-ST elevation MI in August 2005. At that time, she had a  cardiac catheterization that revealed normal LV function 20% mid, and 30%  distal left main stenosis, 40% distal circumflex motion, and a totally  occluded RCA that filled via collaterals. Medical treatment was recommended  at that time. The patient now returns with recurrent chest discomfort.   BASELINE DATA:  1.  Electrocardiogram reveals a sinus rhythm at 64 beats per minute,      nonspecific ST abnormalities, poor R-wave progression. Blood pressure is      138/80.  2.  The patient exercised for a total of 6 minutes and 33 seconds, Bruce      protocol stage III and 7.0 mets. Maximal heart rate achieved was read by      the computer as 156 beats per minute. However, this is artifact. Actual      maximal heart rate was approximately 130 beats per minute which is      approximately 87% of predicted maximum value. Maximal blood pressure was      168/88 and resolved down to 138/78 in recovery. EKG revealed few PVC's.      She did have some minimal ST depression in inferior leads with exercise.  3.  The patient reported mild shortness of breath at the end of exercise. No      chest discomfort was noted. Exercise was stopped secondary to leg      fatigue.  4.  Final images and results are pending MD review.      Amy B   AB/MEDQ  D:  09/01/2004  T:  09/01/2004  Job:  [id number]

## 2010-07-18 NOTE — Cardiovascular Report (Signed)
[place]  Patient:    Cindy Brandt, Cindy Brandt Visit Number: [id number] MRN: [id number]          Service Type: MED Location: [phone] Attending Physician:  Nelta Numbers Dictated by:   [name], M.D. [facility] Proc. Date: 08/19/01 Admit Date:  08/17/2001   CC:         Ayesha Mohair, M.D.  Cardiac Catheterization Lab   Cardiac Catheterization  PROCEDURES PERFORMED: 1. Left heart catheterization. 2. Coronary angiography. 3. Left ventriculography. 4. Abdominal aortography.  INDICATIONS:  The patient is a 77 year old woman who presented to the hospital two days ago after greater than 12 hours of substernal chest pain.  She subsequently ruled in for a small non-Q-wave myocardial infarction.  She had been maintained on heparin and Integrilin since admission.  She was referred for cardiac catheterization to evaluate the coronary anatomy.  PROCEDURAL NOTE:  A 6-French sheath was placed in the right femoral artery. Standard Judkins 6-French catheters were utilized.  Contrast was Omnipaque. Of note, after completion of the diagnostic catheterization, we had planned to proceed with percutaneous coronary intervention. The patient was given 2000 units of heparin; however, subsequently she became very hypotensive with systolic blood pressure in the 60s.  In review of the abdominal aortogram there appeared to be a right pelvic mass compressing the bladder, with apparent extravasation of contrast from the either common femoral or external iliac artery.  This was felt to be consistent with a retroperitoneal hematoma.  At that point, therefore, nitroglycerin was stopped and the patient was placed on wide-open fluids and intravenous dopamine was started.  We placed a 6-French sheath in the left femoral vein for venous access.  Integrilin was discontinued and a total of 20 mg Protamine was administered to reverse the heparin effect.  With this measures,  the patient stabilized with a systolic blood pressure over 100.  She was then transported to the radiology suite for a STAT pelvic CT scan.  She will subsequently be managed in the CCU with a vascular surgery consult.  RESULTS:  HEMODYNAMICS: 1. Left ventricular pressure:  124/20. 2. Aortic pressure:  130/76. There is no aortic valve gradient.  LEFT VENTRICULOGRAM:  There is moderate hypokinesis of the inferior wall. Ejection fraction calculated at 52%.  There is trace mitral regurgitation.  ABDOMINAL AORTOGRAM:  Reveals patent renal arteries.  There is ectasia and possible early aneurysmal changes of the distal abdominal aorta.  There is mild bilateral iliac disease.  On further review of the aortogram there appears to be a moderately large mass in the right pelvis, which was displacing the bladder to the left, and there was a vascular blush there, which on further review appears to be extravasation of contrast and a retroperitoneal hematoma forming.  CORONARY ARTERIOGRAPHY (Right dominant): 1. LEFT MAIN:  Has a distal 40% stenosis. 2. LEFT ANTERIOR DESCENDING ARTERY:  Has a mid 30% stenosis.  The LAD gives    rise to three small diagonal branches. 3. LEFT CIRCUMFLEX:  Has a 70% stenosis in the mid vessel, following which    there is a small aneurysm.  The distal circumflex has a 30% stenosis.    The circumflex gives rise to a large branching ramus intermediate, which    has a diffuse 30% stenosis.  There is a very small obtuse marginal branch    and two small posterolateral branches arising from the distal    circumflex. 4. RIGHT CORONARY ARTERY:  A relatively small-caliber vessel,  with diffuse    calcification.  There is a diffuse 30% stenosis in the mid vessel.  The    distal right coronary artery has a 99% stenosis, followed by a long 70%    stenosis.  There is TIMI-II flow beyond this, filling a relatively small    posterior descending artery and a small posterolateral  branch. There is    also grade I left-to-right collaterals filling the distal right coronary.  IMPRESSIONS: 1. Mildly decreased left ventricular systolic function. 2. Two-vessel coronary artery disease.  The culprit for the patients recent    myocardial infarction appears to be the subtotal occlusion of the distal    right coronary artery.  There is also moderate disease in the left    circumflex of borderline severity. 3. Apparent development of retroperitoneal hematoma, secondary to a    combination of anticoagulation and cardiac catheterization.  The patient    was stabilized with therapy as outline above.  PLAN: 1. Emergent CT scan to assess the presence for retroperitoneal hematoma. 2. STAT CBC has been sent and typed and crossed.  The patient will be    transfused as indicated, with continued volume resuscitation. 3. Vascular surgery will be consulted. 4. Regarding coronary artery disease, ultimately PCI of the distal right    coronary artery is indicated.  However, this will necessarily need to be    delayed until stabilization of her apparent retroperitoneal bleeding.Dictated by:   [name], M.D. [facility] Attending Physician:  Nelta Numbers DD:  08/19/01 TD:  08/21/01 Job: [id number]

## 2010-07-18 NOTE — Cardiovascular Report (Signed)
NAME:  Cindy Brandt, Cindy Brandt                            ACCOUNT NO.:  [id number]   MEDICAL RECORD NO.:  [id number]                   PATIENT TYPE:  INP   LOCATION:  2001                                 FACILITY:  [facility]   PHYSICIAN:  [name], M.D. [facility]              DATE OF BIRTH:  February 11, 1932   DATE OF PROCEDURE:  10/29/2003  DATE OF DISCHARGE:                              CARDIAC CATHETERIZATION   HISTORY:  [name] is 77 years old and had an unsuccessful attempt at PCI  of the right coronary artery in 2003.  However, she did well until just  recently when she developed chest pain and was hospitalized at [facility] and transferred to [place] with positive enzymes consistent with a non-  ST elevation myocardial infarction.   DESCRIPTION OF PROCEDURE:  The procedure was performed with __________  arterial sheath and __________ coronary catheters.  Frontal arterial  puncture was performed and Omnipaque contrast was used.  A distal aortogram  was performed to rule out renovascular causes for hypertension and to rule  out abdominal aortic aneurysm.  The right femoral artery was closed with  Angiocele at the end of the procedure.  The patient tolerated the procedure  well and left the laboratory in satisfactory condition.   RESULTS:  The left main:  The left main coronary artery had a 20% mid and  30% distal stenosis.   The left anterior descending coronary artery gave rise to a diagonal branch  and two septal perforators.  The LAD was irregular and had some  calcification but there was no major obstruction.   The circumflex artery gave rise to a ramus branch and an AV branch was  terminated into two posterolateral branches.  There was 40% narrowing in the  mid to distal vessel.   The right coronary artery gave rise to a conus branch.  A right ventricular  branch was completely occluded in its mid to distal portion after a stent in  the mid vessel.  The distal right coronary artery  consisted of a small  posterior descending branch which filled by collateral from the left  coronary artery.   Left ventriculogram performed in the RAO projection hypokinesis in the mid  inferior wall.  The overall wall motion was good with an estimated ejection  fraction of 60%.   A distal aortogram was performed which showed patent renal arteries and no  significant aortoiliac obstruction.   The aortic pressure was 145/77 and a mean of 44.  Left ventricular pressure  was 145/21.   CONCLUSION:  Coronary artery disease, status post prior unsuccessful  percutaneous coronary intervention of the right coronary artery in 2003 with  20 and 30% narrowing of the left main coronary artery, irregularities in the  left anterior descending coronary artery, 40% narrowing in the mid  circumflex artery, and total occlusion of the mid to distal right coronary  artery with slight inferior wall hypokinesis.   RECOMMENDATIONS:  I suspect the culprit for the patient's recent non-ST  elevation infarction is related to occlusion of the right coronary artery.  This is a small vessel and she has good collaterals from the left side and I  think medical therapy would be the best option.  Will plan continued medical  therapy.                                               [name], M.D. [facility]    BB/MEDQ  D:  10/29/2003  T:  10/30/2003  Job:  [id number]   cc:   Delena Bali, M.D.  [place], [place]   [place] Bing, M.D.

## 2010-07-18 NOTE — H&P (Signed)
NAME:  Cindy, Brandt NO.:  [id number]   MEDICAL RECORD NO.:  [id number]                  PATIENT TYPE:   LOCATION:                                       FACILITY:   PHYSICIAN:  Dalia Heading, M.D.               DATE OF BIRTH:   DATE OF ADMISSION:  DATE OF DISCHARGE:                                HISTORY & PHYSICAL   CHIEF COMPLAINT:  Chronic cholecystitis.   HISTORY OF PRESENT ILLNESS:  The patient is a 77 year old black female with  a history of coronary artery disease and hypertension who was recently  discharged from the hospital after having an episode of right upper quadrant  abdominal pain.  Ultrasound of the gallbladder was negative.  Hepatobiliary  scan reveled chronic cholecystitis with minimal gallbladder ejection  fraction.  Her right upper quadrant pain had subsequently resolved. She now  is coming to the operating room for laparoscopic cholecystectomy.   PAST MEDICAL HISTORY:  Includes coronary artery disease.   PAST SURGICAL HISTORY:  Coronary stenting in the past.   CURRENT MEDICATIONS:  1. Norvasc 5 mg p.o. daily.  2. Toprol XL 50 mg p.o. daily.  3. Darvocet-N 100 q.6h. p.r.n. pain.  4. K-Dur 10 mEq p.o. daily.  5. Zocor 20 mg p.o. daily.  6. Diazide 25/37.5 mg p.o. daily.   ALLERGIES:  No known drug allergies.   REVIEW OF SYSTEMS:  The patient had a stress test on August 22, 2002. This was  negative for any acute ischemia.  Final results reveal that the patient was  a low risk for surgical intervention.   PHYSICAL EXAMINATION:  GENERAL:  On physical examination, the patient is a  pleasant, black female in no acute distress.  VITAL SIGNS:  She is afebrile and vital signs are stable.  LUNGS:  Clear to auscultation with equal breath sounds bilaterally.  HEART:  Heart examination reveals a regular rate and rhythm without S3, S4,  or murmurs.  ABDOMEN:  The abdomen is soft with slight tenderness noted in the right  upper  quadrant to palpation.  No hepatosplenomegaly, masses, or hernias are  identified.   Liver enzyme tests have returned within normal limits.  MET-7 and CBC were  unremarkable.   IMPRESSION:  1. Chronic cholecystitis.  2. Coronary artery disease.  3. Hypertension.   PLAN:  The patient is scheduled for laparoscopic cholecystectomy on September 25, 2002.  The risks and benefits of the procedure including bleeding,  infection, hepatobiliary injury, and the possibility of an open procedure  were fully explained to the patient who gave informed consent.                                               Dalia Heading,  M.D.    MAJ/MEDQ  D:  09/23/2002  T:  09/23/2002  Job:  [id number]   cc:   Kirk Ruths, M.D.  [address]  [place]  [place] [address]  Fax: [phone]

## 2010-07-18 NOTE — H&P (Signed)
NAME:  Cindy Brandt, Cindy Brandt                            ACCOUNT NO.:  [id number]   MEDICAL RECORD NO.:  [id number]                   PATIENT TYPE:  INP   LOCATION:  IC08                                 FACILITY:  [facility]   PHYSICIAN:  Patrica Duel, M.D.                 DATE OF BIRTH:  14-Jun-1931   DATE OF ADMISSION:  09/20/2002  DATE OF DISCHARGE:                                HISTORY & PHYSICAL   CHIEF COMPLAINT:  Abdominal pain and nausea.   HISTORY OF PRESENT ILLNESS:  This is a 77 year old female with a history of  atherosclerotic cardiovascular disease and is status post stenting of  unknown vessels approximately one year ago.  This apparently was following a  myocardial infarction.  She is also status post hysterectomy.  She has a  history of hypertension and hyperlipidemia, which has been well-controlled  on meds noted below.   The patient presented to the emergency department in the early morning hours  with the relatively onset of substernal chest pain, which promptly migrated  to the right upper quadrant of her abdomen and to the right subscapular  region of her back.  This was associated with nausea but no vomiting,  diarrhea, melena, hematemesis, or hematochezia.  She also denied  palpitations, diaphoresis, syncope, or other cardiac symptoms.   She had an emergency department workup and was significant for an EKG, which  failed to reveal any acute changes.  Chemistries were pertinent for markedly  elevated transaminases.  Alkaline phosphatase normal.  Potassium of 3.2.  Cardiac enzymes negative.   After further discussion with the patient, it was found that she has been  having symptoms compatible with biliary colic for several months.  She has  developed intermittent right upper quadrant abdominal pain associated with  eructation and flatulence.  She also describes fatty food intolerance and  had consumed cabbage seasoned with bacon grease prior to the onset of her  symptoms  of this occurrence.   The patient is admitted for further evaluation and therapy of apparent  biliary colic/cholecystitis, rule out MI protocol.  Further evaluation and  therapy is indicated.   CURRENT MEDICATIONS:  Ativan 1 mg Q6 PRN anxiety, Norvasc 5 mg daily, Toprol-  XL 50 mg daily, Darvocet-N 100 Q6 PRN, K-Dur 10 mEq daily, aspirin 81 mg  daily, Zocor 20 daily, Dyazide 25/37.5.   PAST MEDICAL HISTORY:  As noted above.   REVIEW OF SYSTEMS:  Negative except as mentioned.   FAMILY HISTORY:  Noncontributory.   PHYSICAL EXAMINATION:  GENERAL:  This is a very pleasant, fully alert female  who is in no acute distress.  Apparently, in the emergency department, she  was in a great deal of pain.  This has totally resolved this morning.  VITAL SIGNS:  At presentation, temperature 97.3, BP 170/73, heart rate 75,  respirations 22.  HEENT:  Normocephalic and atraumatic.  Pupils are equal.  The sclerae are  nonicteric.  Ears, nose, and throat are benign.  NECK:  Supple.  No bruits noted.  LUNGS:  Clear.  HEART:  Heart sounds are distant.  No apparent murmurs, rubs, or gallops.  ABDOMEN:  Reveals discrete right upper quadrant tenderness with a positive  Murphy's sign.  The epigastrium and the rest of the abdominal exam is  completely benign.  She has no CVA tenderness.  EXTREMITIES:  No clubbing, cyanosis, or edema.  Peripheral pulses are  intact.  NEUROLOGIC:  Without focal deficits.   ASSESSMENT:  Symptom complex compatible with biliary colic with acute  symptoms, probably precipitated by stone passage.  Amylase and lipase are  normal, as is alkaline phosphatase, which is somewhat atypical.  However,  her symptoms are very typical for biliary colic.  She could have early  cholecystitis, though her white count is normal.  She does have  atherosclerotic cardiovascular disease and is status post stenting.  She had  a brief episode of chest pain preceding these more classic gallbladder   symptoms, which are of questionable significance.   PLAN:  Admit for pain control.  Prompt ultrasound of the gallbladder and  liver.  Surgery and cardiology consults.  Will follow and treat expectantly.                                               Patrica Duel, M.D.    MC/MEDQ  D:  09/20/2002  T:  09/20/2002  Job:  [id number]

## 2010-07-18 NOTE — Procedures (Signed)
   NAME:  Cindy Brandt, Cindy Brandt                            ACCOUNT NO.:  [id number]   MEDICAL RECORD NO.:  [id number]                   PATIENT TYPE:  INP   LOCATION:  A215                                 FACILITY:  [facility]   PHYSICIAN:  Maisie Fus C. Wall, M.D.                DATE OF BIRTH:  02-Nov-1931   DATE OF PROCEDURE:  DATE OF DISCHARGE:                                  ECHOCARDIOGRAM   INDICATIONS:  For unspecified cardiovascular disease.  429.2   The echocardiogram was technically difficult.   CONCLUSIONS:  1. Normal left and right atrial size.  2. Moderate left ventricular hypertrophy with preserved left ventricular     systolic function.  Ejection fraction was greater than or equal to 55%.  3. Mitral annular calcification.  4. Mild aortic valve sclerosis.  5. Normal right-sided structures and function.  6. No pericardial effusion.                                               Thomas C. Wall, M.D.    TCW/MEDQ  D:  09/20/2002  T:  09/20/2002  Job:  [id number]   cc:   Vida Roller, M.D.  Fax: [phone]   Patrica Duel, M.D.  [address], [address]  [place]  [place] [address]  Fax: [phone]

## 2010-07-18 NOTE — Consult Note (Signed)
[place]  Patient:    Cindy Brandt, Cindy Brandt Visit Number: [id number] MRN: [id number]          Service Type: MED Location: [id number] Attending Physician:  Nelta Numbers Dictated by:   Caralee Ates, M.D. Proc. Date: 08/19/01 Admit Date:  08/17/2001                            Consultation Report  REASON FOR CONSULTATION:  Retroperitoneal hematoma, status post cardiac catheterization.  HISTORY OF PRESENT ILLNESS:  Cindy Brandt is a 77 year old black female with no prior cardiac history who developed intermittent chest pain two days ago and ultimately came to cardiac catheterization today following the onset of severe substernal chest pain that lasted overnight Tuesday.  Her cardiac catheterization was performed today by [name], M.D., and during the catheterization he noted extravasation of contrast at the level of his femoral cannulation.  She had been on Integrilin and heparin prior to this.  The heparin was reversed with protamine and the sheath was removed from the groin and direct pressure held.  Subsequently she developed some flank pain and hypotension with a blood pressure into the 80s requiring dopamine.  She also has required two units of blood.  Since that time she has been monitored closely in the ICU and has had improvement in her hemodynamics and now is off pressors, and her blood pressure and heart rate have been stable.  Currently she is awake and alert and denies pain.  PAST MEDICAL HISTORY:  Significant for coronary artery disease, hypertension.  MEDICATIONS:  Her medications at home include lorazepam, triamterene/ hydrochlorothiazide, Norvasc, and K-Dur.  PAST SURGICAL HISTORY:  She denies previous abdominal surgery.  SOCIAL HISTORY:  She is a retired Advertising copywriter.  She is married and lives in [place].  She denies smoking or ETOH.  FAMILY HISTORY:  Significant for mother and father with coronary artery disease  and myocardial infarction.  REVIEW OF SYSTEMS:  CONSTITUTIONAL:  A well-developed, slightly obese black female with no recent nausea, vomiting, fever or chills.  HEENT:  Negative. SKIN:  No evidence of ischemic ulceration.  CARDIOVASCULAR:  History of severe coronary artery disease and hypertension.  PULMONARY:  Negative. GASTROINTESTINAL:  Negative.  GENITOURINARY:  Negative.  RENAL:  Negative. ENDOCRINE:  Negative.  PHYSICAL EXAMINATION:  VITAL SIGNS:  She is afebrile.  Her blood pressure is 100/67, heart rate is 68 and regular.  GENERAL:  This is a 77 year old black female seen lying in supine position following her cardiac catheterization, in no acute distress.  She denies pain.  HEENT:  Negative.  CHEST:  Clear bilaterally.  CARDIAC:  Regular rate and rhythm.  ABDOMEN:  Soft, slightly distended.  She has mild tenderness to palpation over her right flank, and otherwise her abdomen was benign.  EXTREMITIES:  Warm and dry.  VASCULAR:  She had 2+ palpable radial, brachial, left femoral pulses, and 1+ palpable 1+ palpable dorsalis pedis pulses bilaterally.  There was no groin hematoma present at the right femoral puncture site.  LABORATORY DATA:  Her last hematocrit was 34.3, which is up 3 g/dl from 41.3 following two units of packed red cells.  Her CT scan showed a moderate-sized retroperitoneal hematoma without extravasation of contrast.  IMPRESSION:  Stable retroperitoneal hematoma, status post right femoral cardiac catheterization.  RECOMMENDATION: 1. Continued hemodynamics monitoring in ICU setting. 2. Serial hemoglobins q.6h., transfuse as indicated. 3. Correction of her coagulation factors  to normal levels. 4. I would not recommend any surgical intervention at this time provided she    remains hemodynamically stable with no evidence of ongoing bleeding. Dictated by:   Caralee Ates, M.D. Attending Physician:  Nelta Numbers DD:  08/19/01 TD:   08/22/01 Job: [id number]

## 2010-07-18 NOTE — Consult Note (Signed)
NAME:  CHARLEE, SQUIBB                            ACCOUNT NO.:  [id number]   MEDICAL RECORD NO.:  [id number]                   PATIENT TYPE:  INP   LOCATION:  IC08                                 FACILITY:  [facility]   PHYSICIAN:  Vida Roller, M.D.                DATE OF BIRTH:  1931/10/29   DATE OF CONSULTATION:  09/20/2002  DATE OF DISCHARGE:                                   CONSULTATION   REFERRING PHYSICIAN:  Patrica Duel, M.D.   PRIMARY CARE PHYSICIAN:  Kirk Ruths, M.D.   REASON FOR CONSULTATION:  Ms. Zabinski is a 77 year old woman who has acute  cholecystitis and is pending a cholecystectomy.  She has a significant past  medical history for coronary artery disease and we have been asked to screen  her preoperatively for perioperative morbidity and mortality from a cardiac  standpoint.  She is essentially asymptomatic aside from her right upper  quadrant pain which is completely inconsistence with her previous chest  discomfort.  She has not had any difficulty since she had her angioplasty  back in June 2003.   PAST MEDICAL HISTORY:  1. Coronary artery disease, status post non-Q wave myocardial infarction in     June 2003, with a heart catheterization which revealed two-vessel     coronary disease with overall mildly depressed LV function.  The     catheterization was complicated by retroperitoneal hematoma.  She     underwent a staged angioplasty of her right coronary artery which was     only partially successful from an angiographic point of view, but she has     not had no problems since then.  Her heart catheterization revealed an     ejection fraction of 52% with distal 40% left main, nonobstructive     disease in her left anterior descending coronary artery, significant     disease in the mid vessel of her circumflex coronary artery and an     occluded right coronary artery which underwent angioplasty.     Complications associated with angioplasty included a  dissection into the     distal vessel with incomplete revascularization.  Since that time, she     has been followed and has done reasonably well.  Her cardiologist is Dr.     Dietrich Pates.  She was last seen in October 2003, and at that point, it was     felt that she was doing well.  She has not had an assessment of her left     ventricular function since her heart catheterization.  2. Hypertension.  3. Hyperlipidemia.  4. She does not have diabetes.   SOCIAL HISTORY:  She lives in [place] with her husband.  She has five  children, one of whom passed away unfortunately at the age of 25 of  pneumonia and the other four are healthy.  She does not smoke and  never has.  She does not drink alcohol.  She does not do any illicit drugs.   FAMILY HISTORY:  Her father passed away at age 96 of complications  associated with diabetes and coronary disease.  Her mother passed away at  age 79 of similar complications.  She is otherwise without any significant  coronary disease in her family.  She has one brother who has significant  diabetes and has lower extremity peripheral vascular disease.   REVIEW OF SYMPTOMS:  Noncontributory.    MEDICATIONS ON ADMISSION:  1. Norvasc 5 mg a day.  2. Aspirin 81 mg a day.  3. Ativan 1 mg four times a day.  4. Toprol XL 50 mg a day.  5. Protonix 40 mg IV q.d.  6. Demerol p.r.n.  7. Darvocet p.r.n.  8. Phenergan p.r.n. for discomfort.   PHYSICAL EXAMINATION:  GENERAL:  She is a well-developed, well-nourished,  African-American female in no apparent distress who is alert and oriented  x4.  VITAL SIGNS:  Blood pressure 134/86, heart rate 74 in sinus, respirations 16  and she is afebrile.  HEENT:  Unremarkable.  NECK:  Supple with no jugular venous distention or carotid bruits.  CHEST:  Clear to auscultation.  CARDIAC:  Nondisplaced point of maximal impulse with no lifts or thrills.  S1, S2 normal.  There is no S3, S4 and no murmurs are noted.  ABDOMEN:   Soft, but mildly tender in the right upper quadrant.  There are  active bowel sounds.  EXTREMITIES:  Her lower extremities are without significant clubbing,  cyanosis or edema.  She has 1+ pulses throughout.  There are no bruits noted  in her femoral arteries.  NEUROLOGIC:  Grossly nonfocal.   LABORATORY DATA AND X-RAY FINDINGS:  White blood cell count 5.9, hemoglobin  13, hematocrit 38, platelet count 256,000.  Sodium 138, potassium 3.2,  chloride 99, bicarb 32, BUN 10, creatinine 0.9 and her blood sugars 173,000,  nonfasting.  Her liver function studies are mildly abnormal with an SGOT of  401 and SGPT of 198.  Amylase 56, lipase 26.  One set of cardiac enzymes is  negative for acute myocardial infarction.  Urinalysis is unremarkable.   Her chest x-ray is not recorded anywhere and I am uncertain whether it was  done.  Her electrocardiogram shows sinus rhythm at a rate of 76 with an  incomplete right bundle branch block.  She has nonspecific ST and T wave  changes, but no Q waves concerning for myocardial infarction and no ST and T  wave changes concerning for acute coronary syndrome.  Comparing her  electrocardiogram with previous electrocardiograms obtained in February  1998, prior to her myocardial infarction, they are essentially unchanged.   IMPRESSION:  Essentially, we have a woman who is preop for cholecystectomy  who had significant and severe known coronary artery disease with  unfortunately only partial revascularization after an acute myocardial  infarction.  She has hypertension which appears to be reasonably well-  controlled and hypercholesterolemia which is currently untreated due to her  abnormal liver function studies.   RECOMMENDATIONS:  I think it is reasonable at this point to assess her left  ventricular function with an echocardiogram today.  If that appears to be  preserved from previous, then an adenosine Cardiolite would be a reasonable screening test to  assess perioperative morbidity.  If her left  ventricular function is significantly depressed from its previous, then we  probably will need to consider assessing her  for significant progression of  her coronary artery disease with possible revascularization even though she  is asymptomatic.                                               Vida Roller, M.D.    JH/MEDQ  D:  09/20/2002  T:  09/20/2002  Job:  [id number]

## 2010-07-18 NOTE — Cardiovascular Report (Signed)
[place]. [facility]  Patient:    Cindy Brandt, Cindy Brandt Visit Number: [id number] MRN: [id number]          Service Type: MED Location: [phone] Attending Physician:  Nelta Numbers Dictated by:   Everardo Beals Juanda Chance, M.D. [facility] Proc. Date: 08/23/01 Admit Date:  08/17/2001   CC:         [name], M.D. [facility]  Dr. Regino Schultze, [place], [place].  Cardiac Catheterization Lab   Cardiac Catheterization  CLINICAL HISTORY:  The patient is 77 years old and was admitted with chest pain and positive enzymes consistent with a non-Q-wave myocardial infarction. She was studied by Dr. [name] last week and found to have a 99% stenosis in the distal portion of a small right coronary artery.  She did not have major obstruction in the left coronary artery and her overall LV function was good. Before an intervention could be done, she developed a retroperitoneal hematoma which was recognized on the table.  She became hypotensive.  This was documented by CT and she was treated with transfusions.  We let her recover from this and brought her back today for intervention on the right coronary artery.  DESCRIPTION OF PROCEDURE:  The procedure was performed via the right femoral artery using an arterial sheath and initially a JR4 #6 [place] guiding catheter with sideholes and a short luge wire.  We crossed the lesion with the luge wire without difficulty and we performed multiple inflations with a 2.25 x 20-mm Maverick in the mid to distal portion of the vessel where there was a long area of segmental disease.  We got a satisfactory result in the distal area but not in the proximal area and felt that we had to stent this when we developed a moderate dissection.  We were unable to pass a 2.5 x 12-mm Express stent down to the lesion despite multiple wires and multiple guiding catheters including a hockey stick and an Amplatz 1 #6 [place] guiding catheter with sideholes and a  Graphix PT wire.  We were finally able to get an S7 2.5 x 18-mm in the mid portion of the vessel where the worst dissection was.  We were unable to get another stent beyond this point.  We had a great deal of difficulty with our guiding catheter.  The Amplatz did not seat well and was difficult to reseat in the ostium.  At the end we realized there was a dissection in the proximal portion of the vessel which may have been related to the guiding catheter.  At this point the patient had been in the catheterization lab over two hours with a large contrast load.  She did not have any chest pain despite the fact that the vessel was intermittently completely occluded.  Overall she tolerated the procedure well.  The artery was open at the end of the procedure but with severe residual disease, both proximally and distally.  We closed the left femoral artery with VasoSeal at the end of the procedure.  CONCLUSIONS:  Unsuccessful attempt at percutaneous coronary intervention of the mid to distal right coronary artery lesion due to development of a severe dissection with inability to access the area of dissection with a stent.  DISPOSITION:  The patient was returned to the post angioplasty unit for further observation.  The vessel is small and she did not have any pain despite the fact it was intermittently occluded so I think she should stabilize with medical therapy.  I would  have a very high threshold on reintervening on this vessel. Dictated by:   Everardo Beals Juanda Chance, M.D. [facility] Attending Physician:  Nelta Numbers DD:  08/23/01 TD:  08/24/01 Job: [id number]

## 2010-07-18 NOTE — Discharge Summary (Signed)
[place]. [facility]  Patient:    Cindy Brandt, Cindy Brandt Visit Number: [id number] MRN: [id number]          Service Type: MED Location: [phone] Attending Physician:  Nelta Numbers Dictated by:   Pennelope Bracken, N.P. Admit Date:  08/17/2001 Discharge Date: 08/25/2001   CC:         [name], M.D.   Discharge Summary  DATE OF BIRTH:  16-Nov-1931.  CARDIOLOGIST:  [name]. Dietrich Pates, M.D.  REASON FOR ADMISSION:  Chest pain.  DISCHARGE DIAGNOSES: 1. Coronary artery disease, non-Q-wave myocardial infarction this admission.    Evaluated with angiography June 20 performed by [name], M.D.,    revealing left main disease estimated to be 40%, left anterior descending    disease 30% midvessel, left circumflex midvessel 70% stenosis, distal 30%    stenosis, ramus intermediate 30%, right coronary artery midvessel diffuse    disease 30%, distal disease at 99% distally with TIMI 2 flow, left and    right collateralization. 2. Mildly decreased left ventricular function, ejection fraction 52%, trace    mitral regurgitation. 3. Retroperitoneal bleed post angiography with associated hypotension,    resolved. 4. Unsuccessful attempt at percutaneous intervention of the right coronary    artery performed on June 24 by Dr. Juanda Chance, with stent placed midvessel only    due to vascular tortuosity and calcifications, severe residual disease. 5. Hypertension, question of hypertensive crisis, treated at [facility],    records not available. 6. Hyperlipidemia. 7. Chronic reductions in potassium, followed by Dr. Regino Schultze of [facility]. 8. Carpal tunnel disease.  HISTORY OF PRESENT ILLNESS:  This delightful 77 year old African-American lady with history as noted above presented to [facility] with complaints of left central chest pain and pressure with left arm radiation.  This occurred the night prior to evaluation and was followed by a "hard  pain" lasting all night with associated diaphoresis and nausea.  She obtained two sublingual nitroglycerin at [facility], which eased the pain off but did not relieve it completely.  An EKG was obtained there, which showed ST segment flattening. At that point the patient was transported by EMS to [facility] for further evaluation.  On arrival to the ED, she was found to have cardiac enzyme elevations of CK, MB, and troponins, and a potassium of 2.9.  Dr. [name] agreed that she needed to be admitted and further evaluated via catheterization.  HOSPITAL COURSE:  The patient was admitted to telemetry and placed on aspirin, nitroglycerin, and a IIb/IIIa inhibitor.  She was enrolled in the SYNERGY trial, heparin and Integrilin _____.  She remained free of chest pain for the next several days and was scheduled for cardiac catheterization as the schedule permitted.  Serial labs were drawn, and her cardiac enzymes continued to crest.  Lipids were drawn and these were found to be elevated, so the patient was started on a statin.  A left heart catheterization was performed June 20 by Dr. [name] with the findings as noted above.  Post procedure the patient developed hypotension and flank pain with blood pressure dipping into the 80s, requiring IV dopamine support.  A large retroperitoneal hematoma was found, and the patient was transfused with two units of blood, with which she had a quick recovery.  She was evaluated by CT surgery for this and found to be hemodynamically stable without evidence of ongoing bleed.  Ultrasound of the right femoral artery was performed, and  this showed no evidence of AV fistulas or pseudoaneurysm.  The patient continued to progress slowly.  She experienced no further chest pain or dyspnea, and some abdominal pain experienced after the first angiography abated.  The patient experienced some transient elevations in her blood pressure prior to her  second catheterization, and these were treated with beta blocker and ACE inhibition with good resolution.  Hypokalemia continued to be a problem, and this was treated with replacement.  Testing was performed to rule out hyperaldosteronism.  Serum aldosterone and plasma renin levels were checked, and these were both normal, as was the patients TSH, hemoglobin A1C, and liver enzymes.  The patient was taken again to the catheterization lab on June 24 by Dr. [name].  A left femoral arterial cannulation was made with multiple balloon inflations.  As outlined above, there was great difficulty getting the stent to reach the distal RCA lesion due to marked tortuosity and calcification. Despite difficulty during this procedure, the patient tolerated it well and the artery was open at the end of the procedure with severe residual disease. The patient recovered uneventfully, and a vascular evaluation of the left femoral arterial site was performed without evidence of pseudoaneurysm or AV fistula.  PHYSICAL EXAMINATION:  VITAL SIGNS:  130/65, 98, 20, 99.4.  Telemetry:  Sinus rhythm without any ectopy.  GENERAL:  The day of discharge, the patient offered no complaints of dyspnea or palpitations.  No acute distress.  NECK:  No JVD.  CHEST:  Clear to auscultation bilaterally.  CARDIAC:  Regular rate and rhythm.  Clear S1 and S2 without murmur, gallop, or rub.  EXTREMITIES:  Without cyanosis, clubbing, or edema.  Bilateral groin sites are ecchymotic and tender, left greater than right, but without hematoma or bruit.  LABORATORY DATA:  Discharge hemogram:  WBC 7.7, hemoglobin 10.6, hematocrit 31.2, platelets 272.  PTT is 30.  BUN 9, creatinine 0.9.  D-dimer was negative.  Admission chemistry:  Sodium 137, potassium 2.9, chloride 102, CO2 28, BUN 13, creatinine 0.8, glucose 150.  Discharge potassium is 3.8.  Lipids: Total cholesterol 195, triglycerides 189, HDL 43, and LDL 114.  Anemia  studies  were performed prior to discharge, and these revealed a normal iron, a low TIBC at 200, a normal percent saturation at 21.  Ferritin was 178.  DISPOSITION:  The patient is discharged to home in the care of her very supportive husband and family.  DISCHARGE MEDICATIONS: 1. Aspirin 81 mg one q.d. 2. Toprol XL 25 mg one q.d. 3. Zocor 20 mg one at 1800. 4. Nitroglycerin 0.4 mg one sublingual every five minutes x3 for chest pain.  The patient is advised not to take her triamterene/hydrochlorothiazide and not to take her Norvasc.  She is urged to hold onto these medicines and to keep a record of her blood pressures to bring to follow up.  DISCHARGE INSTRUCTIONS:  Activity:  No driving, lifting, sexual activity, or heavy exertion until seen by physician.  Diet recommended is low-salt, low-fat, low-cholesterol.  The patient agrees to call the office if her groin wounds become hard or painful.  She will follow up with Dr. Dietrich Pates, who is her husbands physician in [place], on Wednesday, July 9, at 1 p.m., and she will see Dr. Regino Schultze in a weeks time for follow-up of her blood pressure, cholesterol, and potassium.  She knows to call in the interim if any problems, questions, or concerns, or change or increase in symptoms. Dictated by:   Pennelope Bracken, N.P.  Attending Physician:  Nelta Numbers DD:  08/25/01 TD:  08/25/01 Job: [id number]

## 2010-07-18 NOTE — Discharge Summary (Signed)
NAME:  Cindy Brandt, PICHA                            ACCOUNT NO.:  [id number]   MEDICAL RECORD NO.:  [id number]                   PATIENT TYPE:  INP   LOCATION:  2001                                 FACILITY:  [facility]   PHYSICIAN:  Learta Codding, M.D. [facility]             DATE OF BIRTH:  01-01-1932   DATE OF ADMISSION:  10/27/2003  DATE OF DISCHARGE:  10/30/2003                           DISCHARGE SUMMARY - REFERRING   REFERRING PHYSICIAN:  Carren Rang, M.D.   DISCHARGE DIAGNOSES:  1.  Non-ST segment elevation myocardial infarction.  2.  Coronary artery disease with normal ejection fraction.  3.  Hypertension, treated.  4.  Hyperlipidemia, treated.  5.  Chronic pain.  6.  Anxiety.   HOSPITAL COURSE:  Ms. Bunte is a 77 year old female who was transferred to  [facility] on October 27, 2003, from [facility]  where she presented with a non-ST segment elevation myocardial infarction.  She awoke at 6 a.m. on the day of admission with substernal chest pain,  nausea and diaphoresis.  She presented to the emergency room and then  transferred to [facility].  She was also noted to be  hypokalemic with a potassium of 2.8 and this was replentished.   She was placed on aspirin, Integrilin, Toprol and Nitropaste and was pain-  free.  Her ventral  catheterization was on October 29, 2003, and she was  found to have the following:  Left main 20 and sequential 30% lesions.  LAD  irregular with calcification.  Circumflex had a 40% mid lesion.  The right  coronary artery was totalled.  There was hypokinesis in the inferior wall  with an EF of 60%.  Aortogram did not reveal any aneurysm.  Dr. Juanda Chance felt  at this time, the recent myocardial infarction was secondary to the right  coronary artery occlusion.  This was a small vessel and medical treatment  was planned.  She was watched in the hospital overnight and was felt ready  for discharge the following day.   Upon discharge, pulse was 73, respiratory rate 20, blood pressure 119/62.  She was afebrile.  Hemoglobin was 12.3, hematocrit 34.8, platelets 269,  white count 4.7.  Her hemoglobin A1C was 5.3.   DISCHARGE MEDICATIONS:  She was discharged to home on the following  medications.  1.  She is to resume her Toprol XL 50 mg a day.  2.  She is to resume her Ativan and Darvocet as prior to admission.   Her new medications include:  1.  Lipitor 80 mg q.h.s.  2.  Altace 2.5 mg a day.  3.  Aspirin 325 mg a day.  4.  Plavix 75 mg a day indefinitely.   She is to stop her Maxzide, K-Dur and Caduet.   She may utilize Tylenol if needed as well as sublingual nitroglycerin as  needed for chest pain.  No straining or heavy lifting.   ACTIVITY:  As per cardiac rehabilitation.   DIET:  She is to remain on a low fat diet.   Clean over catheterization site with soap and water, no scrubbing.  She is  to call the office with further questions or concerns at [phone].  She has  a follow-up appointment with Dr. Smithville Bing on November 13, 2003, at  1:30 p.m.      Guy Franco, P.A. [facility]                      Learta Codding, M.D. [facility]    LB/MEDQ  D:  10/30/2003  T:  10/30/2003  Job:  [id number]   cc:   Carren Rang, M.D.   Kirk Ruths, M.D.  [address]  [place]  [place] [address]  Fax: [phone]   Lackawanna Bing, M.D.

## 2010-07-18 NOTE — Discharge Summary (Signed)
NAME:  Cindy, [name]                            ACCOUNT NO.:  [id number]   MEDICAL RECORD NO.:  [id number]                   PATIENT TYPE:  INP   LOCATION:  A215                                 FACILITY:  [facility]   PHYSICIAN:  Cindy Brandt, M.D.                 DATE OF BIRTH:  Jul 10, 1931   DATE OF ADMISSION:  09/20/2002  DATE OF DISCHARGE:  09/23/2002                                 DISCHARGE SUMMARY   DISCHARGE DIAGNOSES:  1. Abdominal pain, questionable etiology.  Probable gallstone passage.     Workup benign.  2. History of coronary artery disease, status post stenting one year ago.     Cardiolite, echocardiogram, etc.,  benign this admission.  Rule out     myocardial infarction, negative.  3. Mild hypokalemia.  4. Abdominal pain secondary to cholecystitis.   DETAILS REGARDING ADMISSION:  Please refer to admitting note.   HISTORY OF PRESENT ILLNESS:  Briefly, this 77 year old female with the above  history as well as hypertension, presented to the emergency department  complaining of sudden onset of substernal chest pain which promptly migrated  to the right upper quadrant of her abdomen and to the right subscapular  region of her back.  This was associated with nausea but no vomiting,  diarrhea, melena, hematemesis, hematochezia.  She also had no palpitations,  diaphoresis, syncope or other cardiac symptoms.  In the emergency  department, she was found to have no significant EKG changes.  Chemistries  were pertinent for markedly elevated transaminases with a normal alkaline  phosphatase.  Potassium 3.2, cardiac enzymes negative.   The patient had been having symptoms compatible with biliary colic for  several months.  She had intermittent right upper quadrant abdominal pain  associated with eructation and flatulence.  She also describes fatty food  intolerance and consumed cabbage with bacon grease prior to the onset of her  symptoms with this occurrence.  She was admitted for  further evaluation and  therapy of apparent biliary colic, cholecystitis, rule out MI protocol.   HOSPITAL COURSE:  The patient was admitted to ICU.  Rule out protocol was  benign.  Vida Roller, M.D. was consulted as it was felt to assure cardiac  stability, should surgery be indicated.  Dr. Lovell Sheehan was also consulted.  Ultrasound was negative and ultimately HIDA scan showed a nonfunctioning  gallbladder and the patient's need for cholecystectomy.  It was felt that  she was stable for discharge and would return to have this done in day  surgery at the patient's request.   DISPOSITION:  She is to continue her medications, including Toprol, Norvasc  and K-Dur and to hold her aspirin at this time.  She will also continue  Zocor and Dyazide.  Cindy Brandt, M.D.   MC/MEDQ  D:  10/16/2002  T:  10/16/2002  Job:  [id number]

## 2010-07-18 NOTE — H&P (Signed)
[place]. [facility]  Patient:    Cindy Brandt, Cindy Brandt Visit Number: [id number] MRN: [id number]          Service Type: MED Location: CCUB [phone] Attending Physician:  Nelta Numbers Dictated by:   [name], M.D. [facility] Admit Date:  08/17/2001   CC:         [name], M.D.   History and Physical  CHIEF COMPLAINT:  Chest pain.  HISTORY OF PRESENT ILLNESS:  Ms. Petralia is a 77 year old African-American female with no known prior cardiac history.  She does have a history of hypertension. She states she has had intermittent chest pain in the past usually occurring in the evenings after she has had a busy day; however, it has been brief in nature and she has not paid much attention to it.  Two nights ago she had a more noticeable episode of chest pain which resolved on its own.  Last night while she was doing the dishes she developed the onset of severe substernal chest pain.  This was very hard pain described as a heaviness or pressure in the left chest radiating into the left shoulder and arm.  There was associated diaphoresis, nausea, and lightheadedness.  Her pain lasted throughout the night, by her estimation approximately 12 hours.  She presented to Dr. Loyal Gambler office this morning with ongoing chest pain and diaphoresis and was transferred by EMS to [facility].  The patient has been treated with sublingual nitroglycerin and has had complete relief of her chest pain at this point.  Her cardiac review of systems is also notable for a history of dyspnea with a moderate degree of exertion.  She denies any orthopnea or PND, but has had edema.  She has no prior known cardiac history.  Her cardiac risk factors are positive for hypertension.  She also has a family history of premature coronary artery disease.  She has a remote history of tobacco use.  She has no known history of diabetes or hyperlipidemia.  PAST MEDICAL HISTORY:  As noted  in the physicians assistant admission note.  MEDICATIONS:  As noted in the physicians assistant admission note.  SOCIAL HISTORY:  As noted in the physicians assistant admission note.  FAMILY HISTORY:  As noted in the physicians assistant admission note.  REVIEW OF SYSTEMS:  As noted in the physicians assistant admission note.  PHYSICAL EXAMINATION  GENERAL:  This is a well-appearing older woman in no acute distress.  VITAL SIGNS:  Pulse 85 and regular, initial blood pressure 105/73, temperature 97, oxygen saturation on room air 95%.  SKIN:  Warm and dry without generalized rash.  HEENT:  Normocephalic, atraumatic.  Sclerae anicteric.  Eyelids are normal. Oral mucosa is unremarkable.  Patient has upper and lower dentures.  NECK:  No adenopathy or thyromegaly.  No JVD.  Carotid upstroke normal without bruits.  CHEST:  Clear to percussion and auscultation.  CARDIAC:  Apical impulse is diffuse.  Regular rate and rhythm.  Normal S1 and S2 without rub, murmur, or S3.  ABDOMEN:  Protuberant, soft, nontender.  Normal bowel sounds.  No bruits.  EXTREMITIES:  No clubbing, cyanosis, edema.  Peripheral pulses are femoral 1-2+ bilaterally without bruits, pedal pulses 1-2+ bilaterally.  LABORATORIES:  EKG reveals sinus bradycardia at a rate of 58.  There are mild T-wave inversions with mild ST depression in the anterior precordial leads. This has not significantly changed compared to previous EKG of March 2000.  Chest x-ray shows probable chronic  bronchitis and bibasilar atelectasis.  No evidence of congestive heart failure or consolidation.  Other laboratory data as noted in the chart.  Most notably, she has a potassium of 2.9 with a history of chronic hypokalemia.  She also has a CK of 214 with an elevated CK-MB of 17.3 and a troponin I of 0.62.  ASSESSMENT AND PLAN: 1. Chest pain with elevated enzymes consistent with a non ST segment elevation    myocardial infarction.  The  patient presents after prolonged substernal    chest pain which has ultimately been relieved with nitroglycerin.  She does    have some mild EKG changes; however, these appear to be chronic in nature.    However, her enzymes are elevated consistent with a non ST segment    elevation myocardial infarction.  At this point the patient will be treated    with aspirin, nitroglycerin, and heparin versus Lovenox for systemic    anticoagulation.  She will also be started on the 2B3A inhibitor because of    her elevated cardiac enzymes.  We will plan a cardiac catheterization as    soon as the schedule allows.  This will be done more urgently if the    patient has recurrent unstable symptoms. 2. History of hypertension.  Continue antihypertensive therapy. 3. Chronic hypokalemia.  Rule out hyperaldosteronism with hypokalemia and    hypertension.  Will check serum aldosterone levels and plasma renin levels. 4. Cardiac risk factors.  Will check fasting lipids and treat as indicated. Dictated by:   [name], M.D. [facility] Attending Physician:  Nelta Numbers DD:  08/17/01 TD:  08/18/01 Job: [id number]

## 2010-12-11 LAB — BASIC METABOLIC PANEL
BUN: 14
Calcium: 9.3
GFR calc non Af Amer: 50 — ABNORMAL LOW
Glucose, Bld: 105 — ABNORMAL HIGH

## 2010-12-11 LAB — CBC
HCT: 43.4
Platelets: 271
RDW: 12.5

## 2010-12-11 LAB — DIFFERENTIAL
Basophils Absolute: 0
Basophils Relative: 0
Eosinophils Relative: 2
Lymphocytes Relative: 21
Neutro Abs: 4.6

## 2010-12-11 LAB — URINALYSIS, ROUTINE W REFLEX MICROSCOPIC
Leukocytes, UA: NEGATIVE
Nitrite: NEGATIVE
Urobilinogen, UA: 1
pH: 5.5

## 2010-12-11 LAB — URINE MICROSCOPIC-ADD ON

## 2011-01-08 ENCOUNTER — Ambulatory Visit (INDEPENDENT_AMBULATORY_CARE_PROVIDER_SITE_OTHER): Payer: Medicare Other | Admitting: Orthopedic Surgery

## 2011-01-08 ENCOUNTER — Encounter: Payer: Self-pay | Admitting: Orthopedic Surgery

## 2011-01-08 VITALS — Ht 60.0 in | Wt 150.0 lb

## 2011-01-08 DIAGNOSIS — M79609 Pain in unspecified limb: Secondary | ICD-10-CM

## 2011-01-08 DIAGNOSIS — G8929 Other chronic pain: Secondary | ICD-10-CM | POA: Insufficient documentation

## 2011-01-08 DIAGNOSIS — M79605 Pain in left leg: Secondary | ICD-10-CM

## 2011-01-08 DIAGNOSIS — M25569 Pain in unspecified knee: Secondary | ICD-10-CM

## 2011-01-08 NOTE — Patient Instructions (Signed)
Continue vicodin and meloxicam

## 2011-01-08 NOTE — Progress Notes (Signed)
Referral [facility]   Chief complaint LEFT knee pain  The patient developed gradual onset of pain in her LEFT knee and then it progressed to include pain in her hip and posterior leg radiating into her calf and became severe.  It has lessened.  She was considering canceling the appointment but the symptoms seem to come back somewhat so she is came in anyway  She is on Vicodin and meloxicam.  She complains of diffuse knee pain.  Some aching in the calf and back of the leg and in the popliteal fossa  No catching locking or giving way  Her past history and review of systems have been reviewed and are recorded and are in the scan documents  Physical Exam(12) GENERAL: normal development   CDV: pulses are normal   Skin: normal  Lymph: nodes were not palpable/normal  Psychiatric: awake, alert and oriented  Neuro: normal sensation  MSK Ambulation pattern normal 1 LEFT knee examination reveals no abnormalities other than a small popliteal cyst.  The joint is free of effusion has normal range of motion and her quadriceps strength is normal.  Her knee is stable is a negative McMurray sign.  Her RIGHT knee is also normal.  No swelling or effusion no tenderness in the joint.   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  Otherwise the joint spaces are normal additional collapse of the condyles.  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

## 2011-02-19 ENCOUNTER — Encounter: Payer: Self-pay | Admitting: Cardiology

## 2011-03-03 DIAGNOSIS — D126 Benign neoplasm of colon, unspecified: Secondary | ICD-10-CM

## 2011-03-03 HISTORY — DX: Benign neoplasm of colon, unspecified: D12.6

## 2011-03-24 ENCOUNTER — Other Ambulatory Visit (HOSPITAL_COMMUNITY): Payer: Self-pay | Admitting: Family Medicine

## 2011-03-24 DIAGNOSIS — Z139 Encounter for screening, unspecified: Secondary | ICD-10-CM

## 2011-03-26 ENCOUNTER — Ambulatory Visit (HOSPITAL_COMMUNITY)
Admission: RE | Admit: 2011-03-26 | Discharge: 2011-03-26 | Disposition: A | Discharge status: Home / Self Care | Payer: Medicare Other | Source: Ambulatory Visit | Attending: Family Medicine | Admitting: Family Medicine

## 2011-03-26 DIAGNOSIS — M818 Other osteoporosis without current pathological fracture: Secondary | ICD-10-CM | POA: Insufficient documentation

## 2011-03-26 DIAGNOSIS — Z780 Asymptomatic menopausal state: Secondary | ICD-10-CM | POA: Insufficient documentation

## 2011-03-26 DIAGNOSIS — Z13820 Encounter for screening for osteoporosis: Secondary | ICD-10-CM | POA: Insufficient documentation

## 2011-03-26 DIAGNOSIS — Z139 Encounter for screening, unspecified: Secondary | ICD-10-CM

## 2011-03-26 DIAGNOSIS — Z1231 Encounter for screening mammogram for malignant neoplasm of breast: Secondary | ICD-10-CM | POA: Insufficient documentation

## 2011-07-20 ENCOUNTER — Ambulatory Visit (HOSPITAL_COMMUNITY)
Admission: RE | Admit: 2011-07-20 | Discharge: 2011-07-20 | Disposition: A | Discharge status: Home / Self Care | Payer: Medicare Other | Source: Ambulatory Visit | Attending: Internal Medicine | Admitting: Internal Medicine

## 2011-07-20 ENCOUNTER — Other Ambulatory Visit (HOSPITAL_COMMUNITY): Payer: Self-pay | Admitting: Internal Medicine

## 2011-07-20 DIAGNOSIS — S8990XA Unspecified injury of unspecified lower leg, initial encounter: Secondary | ICD-10-CM | POA: Insufficient documentation

## 2011-07-20 DIAGNOSIS — S86819A Strain of other muscle(s) and tendon(s) at lower leg level, unspecified leg, initial encounter: Secondary | ICD-10-CM

## 2011-07-20 DIAGNOSIS — S838X9A Sprain of other specified parts of unspecified knee, initial encounter: Secondary | ICD-10-CM

## 2011-07-20 DIAGNOSIS — M25569 Pain in unspecified knee: Secondary | ICD-10-CM | POA: Insufficient documentation

## 2011-07-20 DIAGNOSIS — W19XXXA Unspecified fall, initial encounter: Secondary | ICD-10-CM | POA: Insufficient documentation

## 2011-07-20 DIAGNOSIS — M25469 Effusion, unspecified knee: Secondary | ICD-10-CM | POA: Insufficient documentation

## 2011-07-31 ENCOUNTER — Telehealth: Payer: Self-pay

## 2011-07-31 NOTE — Telephone Encounter (Signed)
LMOM to call.

## 2011-08-03 ENCOUNTER — Telehealth: Payer: Self-pay | Admitting: Urgent Care

## 2011-08-03 ENCOUNTER — Encounter (HOSPITAL_COMMUNITY): Payer: Self-pay | Admitting: Pharmacy Technician

## 2011-08-03 ENCOUNTER — Other Ambulatory Visit: Payer: Self-pay

## 2011-08-03 DIAGNOSIS — Z139 Encounter for screening, unspecified: Secondary | ICD-10-CM

## 2011-08-03 NOTE — Telephone Encounter (Signed)
Per Dr. Darrick Penna, OK to change to [employer].

## 2011-08-03 NOTE — Telephone Encounter (Signed)
Returned call and scheduled pt for 08/06/2011 at 10:30 AM with Dr. Darrick Penna.

## 2011-08-03 NOTE — Telephone Encounter (Signed)
Gastroenterology Pre-Procedure Form    Request Date: 08/03/2011       Requesting Physician: Dr. Regino Schultze     PATIENT INFORMATION:  Cindy Brandt is a 78 y.o., female (DOB=04-Jul-1931).  PROCEDURE: Procedure(s) requested: colonoscopy Procedure Reason: screening for colon cancer  PATIENT REVIEW QUESTIONS: The patient reports the following:   1. Diabetes Melitis: no 2. Joint replacements in the past 12 months: no 3. Major health problems in the past 3 months: no 4. Has an artificial valve or MVP:no 5. Has been advised in past to take antibiotics in advance of a procedure like teeth cleaning: no}    MEDICATIONS & ALLERGIES:    Patient reports the following regarding taking any blood thinners:   Plavix? no Aspirin?yes  Coumadin?  no  Patient confirms/reports the following medications:  Current Outpatient Prescriptions  Medication Sig Dispense Refill  . amLODipine (NORVASC) 10 MG tablet       . aspirin 81 MG tablet Take 81 mg by mouth daily.        Marland Kitchen atenolol (TENORMIN) 100 MG tablet       . HYDROcodone-acetaminophen (VICODIN) 5-500 MG per tablet Pt said she takes 1-2 tablets daily      . LORazepam (ATIVAN) 1 MG tablet Take 1 mg by mouth every 8 (eight) hours. Pt said she takes one at bedtime daily and sometimes one during the day      . nitroGLYCERIN (NITROSTAT) 0.4 MG SL tablet Place 0.4 mg under the tongue every 5 (five) minutes as needed.      . simvastatin (ZOCOR) 20 MG tablet       . doxazosin (CARDURA) 2 MG tablet       . KLOR-CON M20 20 MEQ tablet       . meloxicam (MOBIC) 15 MG tablet       . triamterene-hydrochlorothiazide (MAXZIDE) 75-50 MG per tablet         Patient confirms/reports the following allergies:  Allergies  Allergen Reactions  . Lorcet (Hydrocodone-Acetaminophen)     Funny feeling in chest  . Tylenol (Acetaminophen)     Patient is appropriate to schedule for requested procedure(s): yes  AUTHORIZATION INFORMATION Primary Insurance:   ID #:   Group #:    Pre-Cert / Auth required: Pre-Cert / Auth #:   Secondary Insurance:   ID #:   Group #:  Pre-Cert / Auth required:  Pre-Cert / Auth #:  No orders of the defined types were placed in this encounter.    SCHEDULE INFORMATION: Procedure has been scheduled as follows:  Date: 08/06/2011    Time: 10:30 AM  Location: [facility] Short Stay  This Gastroenterology Pre-Precedure Form is being routed to the following provider(s) for review: Jonette Eva, MD    Rx and instructions will be sent to [facility] in [place].

## 2011-08-03 NOTE — Telephone Encounter (Signed)
HOLD MAXZIDE ON DAY BEFORE AND DAY OF PROCEDURE.

## 2011-08-03 NOTE — Telephone Encounter (Signed)
Please return patient's call at [phone]

## 2011-08-03 NOTE — Telephone Encounter (Signed)
PREPOPIK-DRINK WATER TO KEEP URINE LIGHT YELLOW.  PT SHOULD DROP OFF RX 3 DAYS PRIOR TO PROCEDURE.  

## 2011-08-04 ENCOUNTER — Telehealth: Payer: Self-pay | Admitting: Gastroenterology

## 2011-08-04 NOTE — Telephone Encounter (Signed)
Called pt. She just wanted to make sure she did not have to stop ASA before colonoscopy. She is aware she did not have to.

## 2011-08-04 NOTE — Telephone Encounter (Signed)
Pt has a question about taking her baby aspirin. Please call her back at [phone]

## 2011-08-06 ENCOUNTER — Encounter (HOSPITAL_COMMUNITY)
Admission: RE | Disposition: A | Discharge status: Home / Self Care | Payer: Self-pay | Source: Ambulatory Visit | Attending: Gastroenterology

## 2011-08-06 ENCOUNTER — Ambulatory Visit (HOSPITAL_COMMUNITY)
Admission: RE | Admit: 2011-08-06 | Discharge: 2011-08-06 | Disposition: A | Discharge status: Home / Self Care | Payer: Medicare Other | Source: Ambulatory Visit | Attending: Gastroenterology | Admitting: Gastroenterology

## 2011-08-06 ENCOUNTER — Encounter (HOSPITAL_COMMUNITY): Payer: Self-pay | Admitting: *Deleted

## 2011-08-06 DIAGNOSIS — Z79899 Other long term (current) drug therapy: Secondary | ICD-10-CM | POA: Insufficient documentation

## 2011-08-06 DIAGNOSIS — Z139 Encounter for screening, unspecified: Secondary | ICD-10-CM

## 2011-08-06 DIAGNOSIS — D126 Benign neoplasm of colon, unspecified: Secondary | ICD-10-CM | POA: Insufficient documentation

## 2011-08-06 DIAGNOSIS — Z1211 Encounter for screening for malignant neoplasm of colon: Secondary | ICD-10-CM | POA: Insufficient documentation

## 2011-08-06 DIAGNOSIS — E785 Hyperlipidemia, unspecified: Secondary | ICD-10-CM | POA: Insufficient documentation

## 2011-08-06 DIAGNOSIS — I10 Essential (primary) hypertension: Secondary | ICD-10-CM | POA: Insufficient documentation

## 2011-08-06 HISTORY — PX: COLONOSCOPY: SHX5424

## 2011-08-06 SURGERY — COLONOSCOPY
Anesthesia: Moderate Sedation

## 2011-08-06 MED ORDER — MEPERIDINE HCL 100 MG/ML IJ SOLN
INTRAMUSCULAR | Status: DC | PRN
  Administered 2011-08-06 (×2): 25 mg via INTRAVENOUS

## 2011-08-06 MED ORDER — MIDAZOLAM HCL 5 MG/5ML IJ SOLN
INTRAMUSCULAR | Status: AC
  Filled 2011-08-06: qty 5

## 2011-08-06 MED ORDER — MIDAZOLAM HCL 5 MG/5ML IJ SOLN
INTRAMUSCULAR | Status: DC | PRN
  Administered 2011-08-06: 1 mg via INTRAVENOUS
  Administered 2011-08-06: 2 mg via INTRAVENOUS

## 2011-08-06 MED ORDER — SODIUM CHLORIDE 0.45 % IV SOLN
Freq: Once | INTRAVENOUS | Status: AC
  Administered 2011-08-06: 10:00:00 via INTRAVENOUS

## 2011-08-06 MED ORDER — MEPERIDINE HCL 100 MG/ML IJ SOLN
INTRAMUSCULAR | Status: AC
  Filled 2011-08-06: qty 1

## 2011-08-06 NOTE — Discharge Instructions (Signed)
You had 6 small polyps removed.    FOLLOW A HIGH FIBER DIET. AVOID ITEMS THAT CAUSE BLOATING OR GAS. SEE INFO BELOW. YOUR BIOPSY RESULTS SHOULD BE BACK IN 7 DAYS.   Colonoscopy Care After Read the instructions outlined below and refer to this sheet in the next week. These discharge instructions provide you with general information on caring for yourself after you leave the hospital. While your treatment has been planned according to the most current medical practices available, unavoidable complications occasionally occur. If you have any problems or questions after discharge, call DR. Karriem Muench, [phone].  ACTIVITY  You may resume your regular activity, but move at a slower pace for the next 24 hours.   Take frequent rest periods for the next 24 hours.   Walking will help get rid of the air and reduce the bloated feeling in your belly (abdomen).   No driving for 24 hours (because of the medicine (anesthesia) used during the test).   You may shower.   Do not sign any important legal documents or operate any machinery for 24 hours (because of the anesthesia used during the test).    NUTRITION  Drink plenty of fluids.   You may resume your normal diet as instructed by your doctor.   Begin with a light meal and progress to your normal diet. Heavy or fried foods are harder to digest and may make you feel sick to your stomach (nauseated).   Avoid alcoholic beverages for 24 hours or as instructed.    MEDICATIONS  You may resume your normal medications.   WHAT YOU CAN EXPECT TODAY  Some feelings of bloating in the abdomen.   Passage of more gas than usual.   Spotting of blood in your stool or on the toilet paper  .  IF YOU HAD POLYPS REMOVED DURING THE COLONOSCOPY:  Eat a soft diet IF YOU HAVE NAUSEA, BLOATING, ABDOMINAL PAIN, OR VOMITING.    FINDING OUT THE RESULTS OF YOUR TEST Not all test results are available during your visit. DR. Darrick Penna WILL CALL YOU WITHIN 7  DAYS OF YOUR PROCEDUE WITH YOUR RESULTS. Do not assume everything is normal if you have not heard from DR. Theophilus Walz IN ONE WEEK, CALL HER OFFICE AT [phone].  SEEK IMMEDIATE MEDICAL ATTENTION AND CALL THE OFFICE: [phone] IF:  You have more than a spotting of blood in your stool.   Your belly is swollen (abdominal distention).   You are nauseated or vomiting.   You have a temperature over 101F.   You have abdominal pain or discomfort that is severe or gets worse throughout the day.   High-Fiber Diet A high-fiber diet changes your normal diet to include more whole grains, legumes, fruits, and vegetables. Changes in the diet involve replacing refined carbohydrates with unrefined foods. The calorie level of the diet is essentially unchanged. The Dietary Reference Intake (recommended amount) for adult males is 38 grams per day. For adult females, it is 25 grams per day. Pregnant and lactating women should consume 28 grams of fiber per day. Fiber is the intact part of a plant that is not broken down during digestion. Functional fiber is fiber that has been isolated from the plant to provide a beneficial effect in the body. PURPOSE  Increase stool bulk.   Ease and regulate bowel movements.   Lower cholesterol.  INDICATIONS THAT YOU NEED MORE FIBER  Constipation and hemorrhoids.   Uncomplicated diverticulosis (intestine condition) and irritable bowel syndrome.   Weight management.  As a protective measure against hardening of the arteries (atherosclerosis), diabetes, and cancer.   GUIDELINES FOR INCREASING FIBER IN THE DIET  Start adding fiber to the diet slowly. A gradual increase of about 5 more grams (2 slices of whole-wheat bread, 2 servings of most fruits or vegetables, or 1 bowl of high-fiber cereal) per day is best. Too rapid an increase in fiber may result in constipation, flatulence, and bloating.   Drink enough water and fluids to keep your urine clear or pale yellow.  Water, juice, or caffeine-free drinks are recommended. Not drinking enough fluid may cause constipation.   Eat a variety of high-fiber foods rather than one type of fiber.   Try to increase your intake of fiber through using high-fiber foods rather than fiber pills or supplements that contain small amounts of fiber.   The goal is to change the types of food eaten. Do not supplement your present diet with high-fiber foods, but replace foods in your present diet.  INCLUDE A VARIETY OF FIBER SOURCES  Replace refined and processed grains with whole grains, canned fruits with fresh fruits, and incorporate other fiber sources. White rice, white breads, and most bakery goods contain little or no fiber.   Brown whole-grain rice, buckwheat oats, and many fruits and vegetables are all good sources of fiber. These include: broccoli, Brussels sprouts, cabbage, cauliflower, beets, sweet potatoes, white potatoes (skin on), carrots, tomatoes, eggplant, squash, berries, fresh fruits, and dried fruits.   Cereals appear to be the richest source of fiber. Cereal fiber is found in whole grains and bran. Bran is the fiber-rich outer coat of cereal grain, which is largely removed in refining. In whole-grain cereals, the bran remains. In breakfast cereals, the largest amount of fiber is found in those with "bran" in their names. The fiber content is sometimes indicated on the label.   You may need to include additional fruits and vegetables each day.   In baking, for 1 cup white flour, you may use the following substitutions:   1 cup whole-wheat flour minus 2 tablespoons.   1/2 cup white flour plus 1/2 cup whole-wheat flour.   Polyps, Colon  A polyp is extra tissue that grows inside your body. Colon polyps grow in the large intestine. The large intestine, also called the colon, is part of your digestive system. It is a long, hollow tube at the end of your digestive tract where your body makes and stores  stool. Most polyps are not dangerous. They are benign. This means they are not cancerous. But over time, some types of polyps can turn into cancer. Polyps that are smaller than a pea are usually not harmful. But larger polyps could someday become or may already be cancerous. To be safe, doctors remove all polyps and test them.   WHO GETS POLYPS? Anyone can get polyps, but certain people are more likely than others. You may have a greater chance of getting polyps if:  You are over 50.   You have had polyps before.   Someone in your family has had polyps.   Someone in your family has had cancer of the large intestine.   Find out if someone in your family has had polyps. You may also be more likely to get polyps if you:   Eat a lot of fatty foods   Smoke   Drink alcohol   Do not exercise  Eat too much   TREATMENT  The caregiver will remove the polyp during sigmoidoscopy or  colonoscopy.    PREVENTION There is not one sure way to prevent polyps. You might be able to lower your risk of getting them if you:  Eat more fruits and vegetables and less fatty food.   Do not smoke.   Avoid alcohol.   Exercise every day.   Lose weight if you are overweight.   Eating more calcium and folate can also lower your risk of getting polyps. Some foods that are rich in calcium are milk, cheese, and broccoli. Some foods that are rich in folate are chickpeas, kidney beans, and spinach.

## 2011-08-06 NOTE — Op Note (Signed)
[facility] [address] [place], [place]  [address]  COLONOSCOPY PROCEDURE REPORT  PATIENT:  Cindy Brandt, Cindy Brandt  MR#:  [id number] BIRTHDATE:  27-Sep-1931, 79 yrs. old  GENDER:  female  ENDOSCOPIST:  Jonette Eva, MD REF. BY:  [name], M.D. ASSISTANT:  PROCEDURE DATE:  08/06/2011 PROCEDURE:  Colonoscopy with biopsy  INDICATIONS:  Screening  MEDICATIONS:   Demerol 50 mg IV, Versed 3 mg IV  DESCRIPTION OF PROCEDURE:    Physical exam was performed. Informed consent was obtained from the patient after explaining the benefits, risks, and alternatives to procedure.  The patient was connected to monitor and placed in left lateral position. Continuous oxygen was provided by nasal cannula and IV medicine administered through an indwelling cannula.  After administration of sedation and rectal exam, the patient's rectum was intubated and the EC-[id number] ([id number]) colonoscope was advanced under direct visualization to the cecum.  The scope was removed slowly by carefully examining the color, texture, anatomy, and integrity mucosa on the way out.  The patient was recovered in endoscopy and discharged home in satisfactory condition. <<PROCEDUREIMAGES>>  FINDINGS:  There were 2 3 MM SESSILE polyps identified and removed. in the cecum.  There were 2 3 MM  SESSILEpolyps identified and removed. in the transverse colon.  There were 2 3 MM SESSILE polyps identified and removed. in the sigmoid colon. AL POLYPS REMOVED VIA COD FORCEPS.  PREP QUALITY: EXCELLENT CECAL W/D TIME:    20 minutes  COMPLICATIONS:    None  ENDOSCOPIC IMPRESSION: SIX SMALL COLON POLYPS  RECOMMENDATIONS: 1) Await biopsy results TCS IN 10-15 YEARS IF BENEFITS OUTWEIGH THE RISKS  REPEAT EXAM:  No  ______________________________ Jonette Eva, MD  CC:  [name], M.D.  n. eSIGNED:   Demontre Padin at 08/06/2011 11:47 AM  [place], [phone]

## 2011-08-06 NOTE — H&P (Signed)
  Primary Care Physician:  Kirk Ruths, MD, MD Primary Gastroenterologist:  Dr. Darrick Penna  Pre-Procedure History & Physical: HPI:  Cindy Brandt is a 78 y.o. female here for COLON CANCER SCREENING.   Past Medical History  Diagnosis Date  . ASCVD (arteriosclerotic cardiovascular disease)   . Hyperlipidemia   . Hypertension   . Anxiety   . Headache   . Myocardial infarction     Past Surgical History  Procedure Date  . Cholecystectomy 2004  . Abdominal hysterectomy   . Hernia repair     umbilical hernia repair as a child    Prior to Admission medications   Medication Sig Start Date End Date Taking? Authorizing Provider  amLODipine (NORVASC) 10 MG tablet Take 5 mg by mouth daily.  12/24/10  Yes Historical Provider, MD  aspirin 81 MG tablet Take 81 mg by mouth daily.    Yes Historical Provider, MD  atenolol (TENORMIN) 100 MG tablet Take 100 mg by mouth daily.  12/24/10  Yes Historical Provider, MD  Homeopathic Products (ALLERGY MEDICINE PO) Take 1 tablet by mouth daily as needed. Allergies   Yes Historical Provider, MD  HYDROcodone-acetaminophen (VICODIN) 5-500 MG per tablet Take 1 tablet by mouth every 4 (four) hours as needed. Pain 11/10/10  Yes Historical Provider, MD  LORazepam (ATIVAN) 1 MG tablet Take 1 mg by mouth daily as needed. Anxiety   Yes Historical Provider, MD  simvastatin (ZOCOR) 20 MG tablet Take 20 mg by mouth at bedtime.  01/02/11  Yes Historical Provider, MD  nitroGLYCERIN (NITROSTAT) 0.4 MG SL tablet Place 0.4 mg under the tongue every 5 (five) minutes as needed. Chest Pain    Historical Provider, MD    Allergies as of 08/03/2011 - Review Complete 08/03/2011  Allergen Reaction Noted  . Tylenol (acetaminophen) Other (See Comments) 01/08/2011    No family history on file.  History   Social History  . Marital Status: Married    Spouse Name: N/A    Number of Children: N/A  . Years of Education: N/A   Occupational History  . Retired    Social History  Main Topics  . Smoking status: Former Smoker -- 0.5 packs/day for 25 years    Types: Cigarettes  . Smokeless tobacco: Not on file  . Alcohol Use: No  . Drug Use: No  . Sexually Active: Not on file   Other Topics Concern  . Not on file   Social History Narrative   No regular exercise    Review of Systems: See HPI, otherwise negative ROS   Physical Exam: BP 149/79  Pulse 81  Temp(Src) 98 F (36.7 C) (Oral)  Resp 18  Ht 5' (1.524 m)  Wt 150 lb (68.04 kg)  BMI 29.30 kg/m2  SpO2 95% General:   Alert,  pleasant and cooperative in NAD Head:  Normocephalic and atraumatic. Neck:  Supple;  Lungs:  Clear throughout to auscultation.    Heart:  Regular rate and rhythm. Abdomen:  Soft, nontender and nondistended. Normal bowel sounds, without guarding, and without rebound.   Neurologic:  Alert and  oriented x4;  grossly normal neurologically.  Impression/Plan:     SCREENING  Plan:  1. TCS TODAY

## 2011-08-11 ENCOUNTER — Telehealth: Payer: Self-pay | Admitting: Gastroenterology

## 2011-08-11 ENCOUNTER — Encounter (HOSPITAL_COMMUNITY): Payer: Self-pay | Admitting: Gastroenterology

## 2011-08-11 NOTE — Telephone Encounter (Signed)
Pt called and is aware of results. She also wanted to thank everyone for being so kind and nice.

## 2011-08-11 NOTE — Telephone Encounter (Signed)
Faxed to PCP and reminder is in the computer

## 2011-08-11 NOTE — Telephone Encounter (Signed)
Please call pt. She had simple adenomas removed from her colon.  High fiber diet. TCS in 10-15 years IF THE BENEFITS OUTWEIGH THE RISKS.

## 2011-08-11 NOTE — Telephone Encounter (Signed)
LMOM to call back

## 2011-11-23 ENCOUNTER — Inpatient Hospital Stay (HOSPITAL_COMMUNITY)
Admission: EM | Admit: 2011-11-23 | Discharge: 2011-11-25 | DRG: 313 | Disposition: A | Discharge status: Home / Self Care | Payer: Medicare Other | Attending: Internal Medicine | Admitting: Internal Medicine

## 2011-11-23 ENCOUNTER — Encounter (HOSPITAL_COMMUNITY): Payer: Self-pay | Admitting: Emergency Medicine

## 2011-11-23 ENCOUNTER — Emergency Department (HOSPITAL_COMMUNITY): Payer: Medicare Other

## 2011-11-23 DIAGNOSIS — R0789 Other chest pain: Principal | ICD-10-CM | POA: Diagnosis present

## 2011-11-23 DIAGNOSIS — F411 Generalized anxiety disorder: Secondary | ICD-10-CM | POA: Diagnosis present

## 2011-11-23 DIAGNOSIS — R51 Headache: Secondary | ICD-10-CM

## 2011-11-23 DIAGNOSIS — Z8249 Family history of ischemic heart disease and other diseases of the circulatory system: Secondary | ICD-10-CM

## 2011-11-23 DIAGNOSIS — I379 Nonrheumatic pulmonary valve disorder, unspecified: Secondary | ICD-10-CM

## 2011-11-23 DIAGNOSIS — Z7982 Long term (current) use of aspirin: Secondary | ICD-10-CM

## 2011-11-23 DIAGNOSIS — I252 Old myocardial infarction: Secondary | ICD-10-CM

## 2011-11-23 DIAGNOSIS — Z90710 Acquired absence of both cervix and uterus: Secondary | ICD-10-CM

## 2011-11-23 DIAGNOSIS — J9819 Other pulmonary collapse: Secondary | ICD-10-CM | POA: Diagnosis present

## 2011-11-23 DIAGNOSIS — I4510 Unspecified right bundle-branch block: Secondary | ICD-10-CM | POA: Diagnosis present

## 2011-11-23 DIAGNOSIS — I10 Essential (primary) hypertension: Secondary | ICD-10-CM | POA: Diagnosis present

## 2011-11-23 DIAGNOSIS — R079 Chest pain, unspecified: Secondary | ICD-10-CM | POA: Diagnosis present

## 2011-11-23 DIAGNOSIS — Z23 Encounter for immunization: Secondary | ICD-10-CM

## 2011-11-23 DIAGNOSIS — Z886 Allergy status to analgesic agent status: Secondary | ICD-10-CM

## 2011-11-23 DIAGNOSIS — Z9089 Acquired absence of other organs: Secondary | ICD-10-CM

## 2011-11-23 DIAGNOSIS — M810 Age-related osteoporosis without current pathological fracture: Secondary | ICD-10-CM | POA: Diagnosis present

## 2011-11-23 DIAGNOSIS — J9801 Acute bronchospasm: Secondary | ICD-10-CM | POA: Diagnosis present

## 2011-11-23 DIAGNOSIS — Z9861 Coronary angioplasty status: Secondary | ICD-10-CM | POA: Diagnosis present

## 2011-11-23 DIAGNOSIS — I200 Unstable angina: Secondary | ICD-10-CM

## 2011-11-23 DIAGNOSIS — Z87891 Personal history of nicotine dependence: Secondary | ICD-10-CM

## 2011-11-23 DIAGNOSIS — M25569 Pain in unspecified knee: Secondary | ICD-10-CM

## 2011-11-23 DIAGNOSIS — E876 Hypokalemia: Secondary | ICD-10-CM

## 2011-11-23 DIAGNOSIS — I5189 Other ill-defined heart diseases: Secondary | ICD-10-CM | POA: Diagnosis present

## 2011-11-23 DIAGNOSIS — F419 Anxiety disorder, unspecified: Secondary | ICD-10-CM | POA: Diagnosis present

## 2011-11-23 DIAGNOSIS — Z833 Family history of diabetes mellitus: Secondary | ICD-10-CM

## 2011-11-23 DIAGNOSIS — R42 Dizziness and giddiness: Secondary | ICD-10-CM | POA: Diagnosis present

## 2011-11-23 DIAGNOSIS — I519 Heart disease, unspecified: Secondary | ICD-10-CM | POA: Diagnosis present

## 2011-11-23 DIAGNOSIS — I2510 Atherosclerotic heart disease of native coronary artery without angina pectoris: Secondary | ICD-10-CM

## 2011-11-23 DIAGNOSIS — Z79899 Other long term (current) drug therapy: Secondary | ICD-10-CM

## 2011-11-23 DIAGNOSIS — R7309 Other abnormal glucose: Secondary | ICD-10-CM | POA: Diagnosis present

## 2011-11-23 DIAGNOSIS — E785 Hyperlipidemia, unspecified: Secondary | ICD-10-CM

## 2011-11-23 DIAGNOSIS — E782 Mixed hyperlipidemia: Secondary | ICD-10-CM | POA: Diagnosis present

## 2011-11-23 HISTORY — DX: Other ill-defined heart diseases: I51.89

## 2011-11-23 HISTORY — DX: Atherosclerotic heart disease of native coronary artery without angina pectoris: I25.10

## 2011-11-23 LAB — CBC WITH DIFFERENTIAL/PLATELET
Basophils Absolute: 0 10*3/uL (ref 0.0–0.1)
Basophils Relative: 1 % (ref 0–1)
Eosinophils Absolute: 0.1 10*3/uL (ref 0.0–0.7)
Hemoglobin: 13.7 g/dL (ref 12.0–15.0)
MCH: 32.5 pg (ref 26.0–34.0)
MCHC: 34.7 g/dL (ref 30.0–36.0)
Neutro Abs: 2 10*3/uL (ref 1.7–7.7)
Neutrophils Relative %: 50 % (ref 43–77)
Platelets: 227 10*3/uL (ref 150–400)
RDW: 12.3 % (ref 11.5–15.5)

## 2011-11-23 LAB — HEPATIC FUNCTION PANEL
AST: 21 U/L (ref 0–37)
Bilirubin, Direct: 0.1 mg/dL (ref 0.0–0.3)
Indirect Bilirubin: 0.5 mg/dL (ref 0.3–0.9)
Total Bilirubin: 0.6 mg/dL (ref 0.3–1.2)

## 2011-11-23 LAB — BASIC METABOLIC PANEL
Chloride: 100 mEq/L (ref 96–112)
GFR calc Af Amer: >90 mL/min (ref >90)
GFR calc non Af Amer: 83 mL/min — ABNORMAL LOW (ref >90)
Potassium: 2.7 mEq/L — CL (ref 3.5–5.1)
Sodium: 138 mEq/L (ref 135–145)

## 2011-11-23 LAB — TROPONIN I
Troponin I: <0.3 ng/mL (ref <0.30)
Troponin I: <0.3 ng/mL (ref <0.30)
Troponin I: <0.3 ng/mL (ref <0.30)

## 2011-11-23 LAB — MAGNESIUM: Magnesium: 1.9 mg/dL (ref 1.5–2.5)

## 2011-11-23 MED ORDER — INFLUENZA VIRUS VACC SPLIT PF IM SUSP
0.5000 mL | INTRAMUSCULAR | Status: AC
  Administered 2011-11-24: 0.5 mL via INTRAMUSCULAR
  Filled 2011-11-23: qty 0.5

## 2011-11-23 MED ORDER — SIMVASTATIN 20 MG PO TABS
20.0000 mg | ORAL_TABLET | Freq: Every day | ORAL | Status: DC
  Administered 2011-11-23 – 2011-11-24 (×2): 20 mg via ORAL
  Filled 2011-11-23 (×2): qty 1

## 2011-11-23 MED ORDER — SODIUM CHLORIDE 0.9 % IJ SOLN
3.0000 mL | Freq: Two times a day (BID) | INTRAMUSCULAR | Status: DC
  Administered 2011-11-23 – 2011-11-25 (×4): 3 mL via INTRAVENOUS
  Filled 2011-11-23 (×3): qty 3

## 2011-11-23 MED ORDER — POTASSIUM CHLORIDE 10 MEQ/100ML IV SOLN
10.0000 meq | Freq: Once | INTRAVENOUS | Status: AC
  Administered 2011-11-23: 10 meq via INTRAVENOUS
  Filled 2011-11-23: qty 100

## 2011-11-23 MED ORDER — ENOXAPARIN SODIUM 80 MG/0.8ML ~~LOC~~ SOLN
70.0000 mg | Freq: Two times a day (BID) | SUBCUTANEOUS | Status: DC
  Administered 2011-11-23 – 2011-11-25 (×5): 70 mg via SUBCUTANEOUS
  Filled 2011-11-23 (×5): qty 0.8

## 2011-11-23 MED ORDER — PANTOPRAZOLE SODIUM 40 MG PO TBEC
40.0000 mg | DELAYED_RELEASE_TABLET | Freq: Every day | ORAL | Status: DC
  Administered 2011-11-23 – 2011-11-25 (×3): 40 mg via ORAL
  Filled 2011-11-23 (×2): qty 1

## 2011-11-23 MED ORDER — ASPIRIN EC 81 MG PO TBEC
81.0000 mg | DELAYED_RELEASE_TABLET | Freq: Every day | ORAL | Status: DC
  Administered 2011-11-23 – 2011-11-25 (×3): 81 mg via ORAL
  Filled 2011-11-23 (×3): qty 1

## 2011-11-23 MED ORDER — SODIUM CHLORIDE 0.9 % IV SOLN: 250.0000 mL | INTRAVENOUS | Status: DC | PRN

## 2011-11-23 MED ORDER — SODIUM CHLORIDE 0.9 % IV SOLN: INTRAVENOUS | Status: DC

## 2011-11-23 MED ORDER — POTASSIUM CHLORIDE CRYS ER 20 MEQ PO TBCR
20.0000 meq | EXTENDED_RELEASE_TABLET | Freq: Three times a day (TID) | ORAL | Status: DC
  Administered 2011-11-23 (×2): 20 meq via ORAL
  Filled 2011-11-23 (×2): qty 1

## 2011-11-23 MED ORDER — SODIUM CHLORIDE 0.9 % IJ SOLN: 3.0000 mL | INTRAMUSCULAR | Status: DC | PRN

## 2011-11-23 MED ORDER — TRAZODONE HCL 50 MG PO TABS
50.0000 mg | ORAL_TABLET | Freq: Every evening | ORAL | Status: DC | PRN
  Administered 2011-11-24: 50 mg via ORAL
  Filled 2011-11-23 (×2): qty 1

## 2011-11-23 MED ORDER — ALUM & MAG HYDROXIDE-SIMETH 200-200-20 MG/5ML PO SUSP: 30.0000 mL | Freq: Four times a day (QID) | ORAL | Status: DC | PRN

## 2011-11-23 MED ORDER — NITROGLYCERIN 0.4 MG SL SUBL: 0.4000 mg | SUBLINGUAL_TABLET | SUBLINGUAL | Status: DC | PRN

## 2011-11-23 MED ORDER — HYDROCODONE-ACETAMINOPHEN 5-325 MG PO TABS
1.0000 | ORAL_TABLET | ORAL | Status: DC | PRN
  Administered 2011-11-23 – 2011-11-25 (×3): 1 via ORAL
  Filled 2011-11-23 (×3): qty 1

## 2011-11-23 MED ORDER — SODIUM CHLORIDE 0.9 % IJ SOLN: 3.0000 mL | Freq: Two times a day (BID) | INTRAMUSCULAR | Status: DC

## 2011-11-23 MED ORDER — MECLIZINE HCL 12.5 MG PO TABS: 25.0000 mg | ORAL_TABLET | Freq: Four times a day (QID) | ORAL | Status: DC | PRN

## 2011-11-23 MED ORDER — ONDANSETRON HCL 4 MG/2ML IJ SOLN: 4.0000 mg | Freq: Four times a day (QID) | INTRAMUSCULAR | Status: DC | PRN

## 2011-11-23 MED ORDER — ASPIRIN 81 MG PO CHEW
324.0000 mg | CHEWABLE_TABLET | Freq: Once | ORAL | Status: AC
  Administered 2011-11-23: 324 mg via ORAL
  Filled 2011-11-23: qty 4

## 2011-11-23 MED ORDER — ONDANSETRON HCL 4 MG PO TABS: 4.0000 mg | ORAL_TABLET | Freq: Four times a day (QID) | ORAL | Status: DC | PRN

## 2011-11-23 MED ORDER — AMLODIPINE BESYLATE 5 MG PO TABS
5.0000 mg | ORAL_TABLET | Freq: Every day | ORAL | Status: DC
  Administered 2011-11-23 – 2011-11-25 (×3): 5 mg via ORAL
  Filled 2011-11-23 (×2): qty 1

## 2011-11-23 MED ORDER — SODIUM CHLORIDE 0.9 % IJ SOLN
INTRAMUSCULAR | Status: AC
  Filled 2011-11-23: qty 3

## 2011-11-23 MED ORDER — POTASSIUM CHLORIDE CRYS ER 20 MEQ PO TBCR
20.0000 meq | EXTENDED_RELEASE_TABLET | Freq: Once | ORAL | Status: AC
  Administered 2011-11-23: 20 meq via ORAL
  Filled 2011-11-23: qty 1

## 2011-11-23 MED ORDER — LORAZEPAM 1 MG PO TABS: 1.0000 mg | ORAL_TABLET | Freq: Four times a day (QID) | ORAL | Status: DC | PRN

## 2011-11-23 MED ORDER — MAGNESIUM HYDROXIDE 400 MG/5ML PO SUSP: 30.0000 mL | Freq: Every day | ORAL | Status: DC | PRN

## 2011-11-23 MED ORDER — MORPHINE SULFATE 2 MG/ML IJ SOLN: 2.0000 mg | INTRAMUSCULAR | Status: DC | PRN

## 2011-11-23 MED ORDER — ATENOLOL 25 MG PO TABS
100.0000 mg | ORAL_TABLET | Freq: Every day | ORAL | Status: DC
  Administered 2011-11-23 – 2011-11-24 (×2): 100 mg via ORAL
  Filled 2011-11-23 (×2): qty 4

## 2011-11-23 MED ORDER — ENOXAPARIN SODIUM 40 MG/0.4ML ~~LOC~~ SOLN: 1.0000 mg/kg | Freq: Two times a day (BID) | SUBCUTANEOUS | Status: DC

## 2011-11-23 NOTE — ED Notes (Signed)
CRITICAL VALUE ALERT  Critical value received:  K+ 2.7  Date of notification:  11/23/2011  Time of notification:  10:45  Critical value read back:yes  Nurse who received alert:  Jackie Plum  MD notified (1st page):  now  Time of first page:  now  MD notified (2nd page):  Time of second page:  Responding MD:  Estell Harpin  Time MD responded:  now

## 2011-11-23 NOTE — ED Provider Notes (Signed)
History     CSN: [id number]  Arrival date & time 11/23/11  0840   First MD Initiated Contact with Patient 11/23/11 0901      Chief Complaint  Patient presents with  . Chest Pain    (Consider location/radiation/quality/duration/timing/severity/associated sxs/prior treatment) HPI Comments: Cindy Brandt presents with a 3 day history of intermittent dull chest pain with radiation into her left arm.  She states pain has been intermittent and not specifically worsened with exertion as it has occurred at rest with the last episode waking her this early morning.  It resolved with 1 sublingual ntg tablet.  She denies shortness of breath,  Nausea or vomiting with these episodes. She has a history of CAD and reports her previous MI was preceded to similar symptoms.  She has seen Dr Dietrich Pates in the past,  But not in at least several years.  She has started taking meclizine 5 days ago for an inner ear "problem" and thought this medicine may be causing her chest pain symptoms,  Hence her delay in presenting with her complaint.  She has taken no medications prior to arrival.  The history is provided by the patient.    Past Medical History  Diagnosis Date  . Coronary atherosclerosis of native coronary artery     Occluded RCA (unsuccessful PCI 2003), nonobstructive left system 2005   . Hyperlipidemia   . Hypertension   . Anxiety   . Headache   . NSTEMI (non-ST elevated myocardial infarction)     Managed medically 2005    Past Surgical History  Procedure Date  . Cholecystectomy 2004  . Abdominal hysterectomy   . Hernia repair     Umbilical hernia repair as a child  . Colonoscopy 08/06/2011    Procedure: COLONOSCOPY;  Surgeon: West Bali, MD;  Location: [facility] SUITE;  Service: Endoscopy;  Laterality: N/A;  10:40 AM    Family History  Problem Relation Age of Onset  . Coronary artery disease Father   . Coronary artery disease Mother   . Diabetes type II Father   . Diabetes type II Mother      History  Substance Use Topics  . Smoking status: Former Smoker -- 0.5 packs/day for 25 years    Types: Cigarettes  . Smokeless tobacco: Not on file  . Alcohol Use: No    OB History    Grav Para Term Preterm Abortions TAB SAB Ect Mult Living                  Review of Systems  Constitutional: Negative for fever.  HENT: Negative for congestion, sore throat and neck pain.   Eyes: Negative.   Respiratory: Positive for chest tightness. Negative for shortness of breath, wheezing and stridor.   Cardiovascular: Negative for chest pain, palpitations and leg swelling.  Gastrointestinal: Negative for nausea and abdominal pain.  Genitourinary: Negative.   Musculoskeletal: Negative for joint swelling and arthralgias.  Skin: Negative.  Negative for rash and wound.  Neurological: Negative for dizziness, weakness, light-headedness, numbness and headaches.  Hematological: Negative.   Psychiatric/Behavioral: Negative.     Allergies  Tylenol  Home Medications  No current outpatient prescriptions on file.  BP 162/78  Pulse 67  Temp 98.4 F (36.9 C) (Oral)  Resp 20  Ht 5\' 2"  (1.575 m)  Wt 154 lb (69.854 kg)  BMI 28.17 kg/m2  SpO2 95%  Physical Exam  Nursing note and vitals reviewed. Constitutional: She appears well-developed and well-nourished.  HENT:  Head: Normocephalic and atraumatic.  Eyes: Conjunctivae normal are normal.  Neck: Normal range of motion.  Cardiovascular: Normal rate, regular rhythm, normal heart sounds and intact distal pulses.   Pulmonary/Chest: Effort normal and breath sounds normal. She has no wheezes. She exhibits no tenderness.  Abdominal: Soft. Bowel sounds are normal. She exhibits no distension. There is no tenderness.  Musculoskeletal: Normal range of motion. She exhibits no edema and no tenderness.  Neurological: She is alert.  Skin: Skin is warm and dry.  Psychiatric: She has a normal mood and affect.    ED Course  Procedures (including  critical care time)  Labs Reviewed  BASIC METABOLIC PANEL - Abnormal; Notable for the following:    Potassium 2.7 (*)     Glucose, Bld 141 (*)     GFR calc non Af Amer 83 (*)     All other components within normal limits  CBC WITH DIFFERENTIAL  TROPONIN I  MAGNESIUM  HEPATIC FUNCTION PANEL  PRO B NATRIURETIC PEPTIDE  TROPONIN I  TSH  HEMOGLOBIN A1C  TROPONIN I   Dg Chest Port 1 View  11/23/2011  *RADIOLOGY REPORT*  Clinical Data: Hypertension, chest pain.  PORTABLE CHEST - 1 VIEW  Comparison: 12/25/2008  Findings: Mild cardiomegaly.  Bibasilar densities, likely atelectasis.  Mild vascular congestion.  No overt edema.  No effusions or acute bony abnormality.  IMPRESSION: Mild cardiomegaly with vascular congestion and bibasilar atelectasis.   Original Report Authenticated By: Cyndie Chime, M.D.      1. Unstable angina   2. Hypopotassemia   3. Other and unspecified hyperlipidemia   4. Unspecified essential hypertension   5. Unspecified cardiovascular disease    Pt was given asa 324 mg PO.  Pt remains stable in ed with no return of chest pain.     MDM  Intermittent chest pain reminiscent of prior MI sx.  Suspect unstable angina despite normal first troponin,  Stable ekg.  Discussed case with Dr. Lendell Caprice, who recommended transfer to [facility] since pt will probably require cardiac cath during this hospitalization.  Spoke with Dr. Eden Emms, on call for  Cards - recommends rule out stay at [facility] with consult by Dr Dietrich Pates.  May be candidate for myoview study rather than cardiac cath.  Call back to Dr. Lendell Caprice who will admit pt.   Date: 11/23/2011  Rate: 78  Rhythm: normal sinus rhythm  QRS Axis: normal  Intervals: QT prolonged  ST/T Wave abnormalities: Inferior q waves  Conduction Disutrbances:none  Narrative Interpretation:   Old EKG Reviewed: unchanged          Burgess Amor, PA 11/23/11 1722

## 2011-11-23 NOTE — Consult Note (Signed)
Primary cardiologist: Dr. Junction Bing  Clinical Summary Cindy Brandt is a 78 y.o.female with past medical history outlined below, currently admitted to the hospital with recent onset chest pain. She states that she has been in her usual state of health until the last 3 or 4 days. She initially experienced a vague chest discomfort, thought it might have been indigestion, belched after having some cola, but then had recurrent chest discomfort on the following few days of much greater intensity and duration. Symptoms have been largely at rest. She had an episode last evening and this morning which concerned her quite a bit, she took a nitroglycerin and the symptoms improved. She is now admitted for further evaluation.  She has been managed medically for CAD, last seen in the office by Dr. Dietrich Pates in May of 2011. Last Myoview was in 2006, no active ischemia at that point. She reports compliance with her medications.  Chest x-ray today shows mild cardiomegaly with vascular congestion as well as bibasilar atelectasis. ECG reviewed showing sinus rhythm with incomplete right bundle branch block and no acute ST segment abnormalities. Initial cardiac markers are normal far.   Allergies  Allergen Reactions  . Tylenol (Acetaminophen) Other (See Comments)    Funny feeling in chest.     Medications    . amLODipine  5 mg Oral Daily  . aspirin  324 mg Oral Once  . aspirin EC  81 mg Oral Daily  . atenolol  100 mg Oral Daily  . enoxaparin (LOVENOX) injection  70 mg Subcutaneous BID  . influenza  inactive virus vaccine  0.5 mL Intramuscular Tomorrow-1000  . pantoprazole  40 mg Oral Q1200  . potassium chloride  10 mEq Intravenous Once  . potassium chloride SA  20 mEq Oral Once  . potassium chloride  20 mEq Oral TID  . simvastatin  20 mg Oral QHS  . sodium chloride  3 mL Intravenous Q12H  . DISCONTD: sodium chloride   Intravenous STAT  . DISCONTD: enoxaparin  1 mg/kg Subcutaneous Q12H  . DISCONTD:  sodium chloride  3 mL Intravenous Q12H    Past Medical History  Diagnosis Date  . Coronary atherosclerosis of native coronary artery     Occluded RCA (unsuccessful PCI 2003), nonobstructive left system 2005   . Hyperlipidemia   . Hypertension   . Anxiety   . Headache   . NSTEMI (non-ST elevated myocardial infarction)     Managed medically 2005    Past Surgical History  Procedure Date  . Cholecystectomy 2004  . Abdominal hysterectomy   . Hernia repair     Umbilical hernia repair as a child  . Colonoscopy 08/06/2011    Procedure: COLONOSCOPY;  Surgeon: West Bali, MD;  Location: [facility];  Service: Endoscopy;  Laterality: N/A;  10:40 AM    Family History  Problem Relation Age of Onset  . Coronary artery disease Father   . Coronary artery disease Mother   . Diabetes type II Father   . Diabetes type II Mother     Social History Cindy Brandt reports that she has quit smoking. Her smoking use included Cigarettes. She has a 12.5 pack-year smoking history. She does not have any smokeless tobacco history on file. Cindy Brandt reports that she does not drink alcohol.  Review of Systems No palpitations, dizziness, syncope. No claudication. No orthopnea or PND. No cough, fevers or chills. Reports stable appetite. No bleeding episodes.  Physical Examination Blood pressure 162/78, pulse 71, temperature 98.4  F (36.9 C), temperature source Oral, resp. rate 20, height 5\' 2"  (1.575 m), weight 154 lb (69.854 kg), SpO2 95.00%.  Patient in no acute distress. HEENT: Conjunctiva and lids normal, oropharynx clear. Neck: Supple, no elevated JVP or carotid bruits, no thyromegaly. Lungs: Clear to auscultation, nonlabored breathing at rest. Cardiac: Regular rate and rhythm, no S3, soft systolic murmur, no pericardial rub. Abdomen: Soft, nontender, bowel sounds present, no guarding or rebound. Extremities: No pitting edema, distal pulses 2+. Skin: Warm and dry. Musculoskeletal: Mild  kyphosis. Neuropsychiatric: Alert and oriented x3, affect grossly appropriate.   Lab Results Basic Metabolic Panel:  Lab 11/23/11 1610 11/23/11 0942  NA -- 138  K -- 2.7*  CL -- 100  CO2 -- 30  GLUCOSE -- 141*  BUN -- 9  CREATININE -- 0.65  CALCIUM -- 9.1  MG 1.9 --  PHOS -- --   Liver Function Tests:  Lab 11/23/11 1418  AST 21  ALT 13  ALKPHOS 54  BILITOT 0.6  PROT 7.3  ALBUMIN 3.6   CBC:  Lab 11/23/11 0942  WBC 4.0  NEUTROABS 2.0  HGB 13.7  HCT 39.5  MCV 93.8  PLT 227   Cardiac Enzymes:  Lab 11/23/11 1418 11/23/11 0942  CKTOTAL -- --  CKMB -- --  CKMBINDEX -- --  TROPONINI <0.30 <0.30    Impression  1. Recent onset chest pain symptoms concerning for unstable angina. ECG shows no acute ST segment changes at this point, initial cardiac markers are normal. She has not undergone ischemic evaluation since 2006.  2. Known CAD with occlusion of the RCA associated with collaterals, and nonobstructive left system disease documented at last cardiac catheterization 2005.  3. Hyperlipidemia, on statin therapy.  4. Hypertension.  5. Hypokalemia, not on obvious diuretic as an outpatient. Currently being repleted.   Recommendations  Discussed situation with patient, husband, and extended family present in room. Her symptoms are concerning as noted, although at this point her cardiac markers and ECG are relatively reassuring. Would continue medical therapy including aspirin, beta blocker, and Lovenox. Continue to cycle cardiac markers. She will be kept n.p.o. after midnight and a followup ECG will be obtained. If she continues to have recurring chest pain symptoms under observation, would lean toward transfer to [facility] for a cardiac catheterization to reassess coronary anatomy, even if cardiac markers remain normal. Alternatively, a followup Myoview could be considered if she remains clinically stable, with plan to pursue invasive cardiac testing if significant  ischemic burden is identified. Dr. Dietrich Pates will be rounding on Cindy Brandt tomorrow, and can guide further evaluation depending on how the patient does clinically and her subsequent cardiac marker trend.  Jonelle Sidle, M.D., F.A.C.C.

## 2011-11-23 NOTE — H&P (Signed)
Hospital Admission Note Date: 11/23/2011  Patient name: Cindy Brandt Medical record number: [id number] Date of birth: 01/19/1932 Age: 78 y.o. Gender: female PCP: Kirk Ruths, MD  Attending physician: Christiane Ha, MD  Chief Complaint: chest pain  History of Present Illness:  Cindy Brandt is an 78 y.o. female who presents with left-sided chest pain radiating to her left arm. She has a history of heart disease with thin non-ST segment elevation MI in 2005. She had had a previous unsuccessful attempt at PCI of the right coronary artery in 2003. Cardiac catheterization in 2005 showed occluded right coronary artery, 20-30% narrowing of the left main coronary artery, regular days in the left anterior descending artery, 40% narrowing in the mid circumflex artery. Ejection fraction was normal at that time. Since then she's done fairly well. She started having chest pressure and squeezing on and off for the past week or so. Last night, she had severe left-sided chest pressure radiating to her left arm. It felt like previous MI, but she did not come to the emergency room until this morning. She had chest pressure today as well and took a nitroglycerin tablet. This improved the pain but she still feels "sore". Last night, she drank a few sips of soda and had some belching. Her symptoms were somewhat improved so she thought it might be indigestion. She takes aspirin and beta blocker daily. She has not had a followup visit with her cardiologist for some time but was previously followed by [facility] cardiology. Her daughter reports also, severe dyspnea on exertion recently. She's had no leg swelling, nor orthopnea. In the emergency room, she had an EKG which showed normal sinus rhythm and incomplete right bundle branch block. I have no old EKG for comparison. The ED physician spoke with Dr. Corrin Parker who recommended admission to [facility], rule out, and cardiology consult. The pain has essentially  resolved.  Past Medical History  Diagnosis Date  . ASCVD (arteriosclerotic cardiovascular disease)   . Hyperlipidemia   . Hypertension   . Anxiety   . Headache   . Myocardial infarction     Meds: Prescriptions prior to admission  Medication Sig Dispense Refill  . alendronate (FOSAMAX) 70 MG tablet Take 70 mg by mouth every 7 (seven) days. Takes on Mondays      . amLODipine (NORVASC) 10 MG tablet Take 5 mg by mouth daily.       Marland Kitchen aspirin 81 MG tablet Take 81 mg by mouth daily.       Marland Kitchen atenolol (TENORMIN) 100 MG tablet Take 100 mg by mouth daily.       . Homeopathic Products (ALLERGY MEDICINE PO) Take 1 tablet by mouth daily as needed. Allergies      . HYDROcodone-acetaminophen (VICODIN) 5-500 MG per tablet Take 1 tablet by mouth every 6 (six) hours as needed. Pain      . LORazepam (ATIVAN) 1 MG tablet Take 1 mg by mouth every 6 (six) hours as needed. Anxiety      . meclizine (ANTIVERT) 25 MG tablet Take 25 mg by mouth 4 (four) times daily. Taking for 10 days, started on 11/19/2011      . nitroGLYCERIN (NITROSTAT) 0.4 MG SL tablet Place 0.4 mg under the tongue every 5 (five) minutes as needed. Chest Pain      . simvastatin (ZOCOR) 20 MG tablet Take 20 mg by mouth at bedtime.         Allergies: Tylenol History   Social History  .  Marital Status: Married    Spouse Name: N/A    Number of Children: N/A  . Years of Education: N/A   Occupational History  . Retired    Social History Main Topics  . Smoking status: Former Smoker -- 0.5 packs/day for 25 years    Types: Cigarettes  . Smokeless tobacco: Not on file  . Alcohol Use: No  . Drug Use: No  . Sexually Active: Not on file   Other Topics Concern  . Not on file   Social History Narrative   No regular exercise   History reviewed. No pertinent family history. Past Surgical History  Procedure Date  . Cholecystectomy 2004  . Abdominal hysterectomy   . Hernia repair     umbilical hernia repair as a child  . Colonoscopy  08/06/2011    Procedure: COLONOSCOPY;  Surgeon: West Bali, MD;  Location: [facility];  Service: Endoscopy;  Laterality: N/A;  10:40 AM    Review of Systems: Systems reviewed and as per HPI. Also, being treated for vertigo with meclizine, started last week. Her symptoms have improved.  Physical Exam: Blood pressure 162/78, pulse 71, temperature 98.4 F (36.9 C), temperature source Oral, resp. rate 20, height 5\' 2"  (1.575 m), weight 69.854 kg (154 lb), SpO2 95.00%. BP 162/78  Pulse 71  Temp 98.4 F (36.9 C) (Oral)  Resp 20  Ht 5\' 2"  (1.575 m)  Wt 69.854 kg (154 lb)  BMI 28.17 kg/m2  SpO2 95%  General Appearance:    Alert, cooperative, no distress, appears stated age  Head:    Normocephalic, without obvious abnormality, atraumatic  Eyes:    PERRL, conjunctiva/corneas clear, EOM's intact, fundi    benign, both eyes  Ears:    Normal TM's and external ear canals, both ears  Nose:   Nares normal, septum midline, mucosa normal, no drainage    or sinus tenderness  Throat:   Lips, mucosa, and tongue normal; teeth and gums normal  Neck:   Supple, symmetrical, trachea midline, no adenopathy;    thyroid:  no enlargement/tenderness/nodules; no carotid   bruit or JVD  Back:     Symmetric, no curvature, ROM normal, no CVA tenderness  Lungs:     Clear to auscultation bilaterally, respirations unlabored  Chest Wall:    slight tenderness of the left chest wall which does not reproduce her pain    Heart:    Regular rate and rhythm, S1 and S2 normal, no murmur, rub   or gallop     Abdomen:     Soft, non-tender, bowel sounds active all four quadrants,    no masses, no organomegaly  Genitalia:   deferred   Rectal:   deferred   Extremities:   Extremities normal, atraumatic, no cyanosis or edema  Pulses:   2+ and symmetric all extremities  Skin:   Skin color, texture, turgor normal, no rashes or lesions  Lymph nodes:   Cervical, supraclavicular, and axillary nodes normal  Neurologic:    CNII-XII intact, normal strength, sensation and reflexes    throughout    Psychiatric: Calm cooperative with normal affect  Lab results: Basic Metabolic Panel:  Basename 11/23/11 0942  NA 138  K 2.7*  CL 100  CO2 30  GLUCOSE 141*  BUN 9  CREATININE 0.65  CALCIUM 9.1  MG --  PHOS --   Liver Function Tests: No results found for this basename: AST:2,ALT:2,ALKPHOS:2,BILITOT:2,PROT:2,ALBUMIN:2 in the last 72 hours No results found for this basename: LIPASE:2,AMYLASE:2 in  the last 72 hours No results found for this basename: AMMONIA:2 in the last 72 hours CBC:  Basename 11/23/11 0942  WBC 4.0  NEUTROABS 2.0  HGB 13.7  HCT 39.5  MCV 93.8  PLT 227   Cardiac Enzymes:  Basename 11/23/11 0942  CKTOTAL --  CKMB --  CKMBINDEX --  TROPONINI <0.30   EKG shows normal sinus rhythm with incomplete right bundle branch block.  Imaging results:  Dg Chest Port 1 View  11/23/2011  *RADIOLOGY REPORT*  Clinical Data: Hypertension, chest pain.  PORTABLE CHEST - 1 VIEW  Comparison: 12/25/2008  Findings: Mild cardiomegaly.  Bibasilar densities, likely atelectasis.  Mild vascular congestion.  No overt edema.  No effusions or acute bony abnormality.  IMPRESSION: Mild cardiomegaly with vascular congestion and bibasilar atelectasis.   Original Report Authenticated By: Cyndie Chime, M.D.     Assessment & Plan: Principal Problem:  *Chest pain somewhat concerning for unstable angina. Will continue aspirin, beta blocker, give full dose Lovenox. Patient is chest pain-free currently. Rule out MI. Consult cardiology. Patient's also been dyspneic on exertion which could be an anginal equivalent. Will also check a pro BNP and echocardiogram. Active Problems:  ATHEROSCLEROTIC CARDIOVASCULAR DISEASE  HYPOKALEMIA, will replete and check a magnesium level  HYPERTENSION, continue outpatient medications  HYPERLIPIDEMIA, continue outpatient medications  ANXIETY Blood sugar is nonfasting but somewhat high.  We'll check a hemoglobin A1c.   Darolyn Double L 11/23/2011, 1:57 PM

## 2011-11-23 NOTE — ED Notes (Signed)
Pt c/o chest pain that started last night at 0300. Pt stated she had some relieve from 1 SL nitro tab taken at home. Pt states she has no chest pain on arrival to ED. Pt stated she started a new med for inner ear last Thursday.

## 2011-11-23 NOTE — Progress Notes (Signed)
*  PRELIMINARY RESULTS* Echocardiogram 2D Echocardiogram has been performed.  Cindy Brandt 11/23/2011, 3:29 PM

## 2011-11-24 ENCOUNTER — Encounter (HOSPITAL_COMMUNITY): Payer: Self-pay | Admitting: Internal Medicine

## 2011-11-24 DIAGNOSIS — R079 Chest pain, unspecified: Secondary | ICD-10-CM

## 2011-11-24 DIAGNOSIS — I5189 Other ill-defined heart diseases: Secondary | ICD-10-CM

## 2011-11-24 DIAGNOSIS — I10 Essential (primary) hypertension: Secondary | ICD-10-CM

## 2011-11-24 DIAGNOSIS — I519 Heart disease, unspecified: Secondary | ICD-10-CM

## 2011-11-24 HISTORY — DX: Other ill-defined heart diseases: I51.89

## 2011-11-24 LAB — HEMOGLOBIN A1C
Hgb A1c MFr Bld: 6.1 % — ABNORMAL HIGH
Mean Plasma Glucose: 128 mg/dL — ABNORMAL HIGH

## 2011-11-24 LAB — POTASSIUM: Potassium: 3 mEq/L — ABNORMAL LOW (ref 3.5–5.1)

## 2011-11-24 MED ORDER — ATENOLOL 25 MG PO TABS
50.0000 mg | ORAL_TABLET | Freq: Every day | ORAL | Status: DC
  Administered 2011-11-25: 50 mg via ORAL
  Filled 2011-11-24: qty 2

## 2011-11-24 MED ORDER — POTASSIUM CHLORIDE CRYS ER 20 MEQ PO TBCR
40.0000 meq | EXTENDED_RELEASE_TABLET | Freq: Three times a day (TID) | ORAL | Status: AC
  Administered 2011-11-24 (×3): 40 meq via ORAL
  Filled 2011-11-24 (×3): qty 2

## 2011-11-24 NOTE — Progress Notes (Addendum)
(Note is reflective of encounter on 11/24/2011).  Subjective: The patient has no complaints of chest pain.  Objective: Vital signs in last 24 hours: Filed Vitals:   11/24/11 0634 11/24/11 1340 11/24/11 2050 11/25/11 0510  BP: 157/82 153/83 132/78 146/75  Pulse: 73 64 67 73  Temp: 98.5 F (36.9 C) 98.6 F (37 C) 99.2 F (37.3 C) 98.8 F (37.1 C)  TempSrc: Oral Oral Oral Oral  Resp: 18 18 16 18   Height:      Weight:      SpO2: 93% 92% 95% 93%    Intake/Output Summary (Last 24 hours) at 11/25/11 1047 Last data filed at 11/24/11 1700  Gross per 24 hour  Intake    483 ml  Output      0 ml  Net    483 ml    Weight change:   Physical exam: General: Pleasant 78 year old [name] sitting up in bed, in no acute distress. Lungs: Clear to auscultation bilaterally Heart: S1, S2, with a 1-2/6 systolic murmur Abdomen: Positive bowel sounds, soft, nontender, nondistended. Extremities: No pedal edema. Neurologic: She is alert and oriented x3. Cranial nerves II through XII are intact.  Lab Results: Basic Metabolic Panel:  Basename 11/25/11 0437 11/24/11 0532 11/23/11 1418 11/23/11 0942  NA 140 -- -- 138  K 3.9 3.0* -- --  CL 107 -- -- 100  CO2 27 -- -- 30  GLUCOSE 125* -- -- 141*  BUN 8 -- -- 9  CREATININE 0.72 -- -- 0.65  CALCIUM 9.0 -- -- 9.1  MG 2.0 -- 1.9 --  PHOS -- -- -- --   Liver Function Tests:  Basename 11/23/11 1418  AST 21  ALT 13  ALKPHOS 54  BILITOT 0.6  PROT 7.3  ALBUMIN 3.6   No results found for this basename: LIPASE:2,AMYLASE:2 in the last 72 hours No results found for this basename: AMMONIA:2 in the last 72 hours CBC:  Basename 11/25/11 0437 11/23/11 0942  WBC 4.8 4.0  NEUTROABS -- 2.0  HGB 12.9 13.7  HCT 37.0 39.5  MCV 95.1 93.8  PLT 212 227   Cardiac Enzymes:  Basename 11/23/11 1954 11/23/11 1418 11/23/11 0942  CKTOTAL -- -- --  CKMB -- -- --  CKMBINDEX -- -- --  TROPONINI <0.30 <0.30 <0.30   BNP:  Basename  11/23/11 1418  PROBNP 157.1   D-Dimer: No results found for this basename: DDIMER:2 in the last 72 hours CBG: No results found for this basename: GLUCAP:6 in the last 72 hours Hemoglobin A1C:  Basename 11/23/11 1418  HGBA1C 6.1*   Fasting Lipid Panel:  Basename 11/25/11 0437  CHOL 162  HDL 41  LDLCALC 96  TRIG 127  CHOLHDL 4.0  LDLDIRECT --   Thyroid Function Tests:  Basename 11/23/11 1418  TSH 1.354  T4TOTAL --  FREET4 --  T3FREE --  THYROIDAB --   Anemia Panel: No results found for this basename: VITAMINB12,FOLATE,FERRITIN,TIBC,IRON,RETICCTPCT in the last 72 hours Coagulation: No results found for this basename: LABPROT:2,INR:2 in the last 72 hours Urine Drug Screen: Drugs of Abuse  No results found for this basename: labopia,  cocainscrnur,  labbenz,  amphetmu,  thcu,  labbarb    Alcohol Level: No results found for this basename: ETH:2 in the last 72 hours Urinalysis: No results found for this basename: COLORURINE:2,APPERANCEUR:2,LABSPEC:2,PHURINE:2,GLUCOSEU:2,HGBUR:2,BILIRUBINUR:2,KETONESUR:2,PROTEINUR:2,UROBILINOGEN:2,NITRITE:2,LEUKOCYTESUR:2 in the last 72 hours Misc. Labs:  Echo:Study Conclusions  - Left ventricle: The cavity size was normal. Wall thickness was increased in a pattern of moderate LVH.  Systolic function was normal. The estimated ejection fraction was in the range of 60% to 65%. There is akinesis of the basalinferior myocardium. Doppler parameters are consistent with abnormal left ventricular relaxation (grade 1 diastolic dysfunction). - Aortic valve: Mildly calcified annulus. Trileaflet; mildly thickened leaflets. - Mitral valve: Calcified annulus. Mildly thickened leaflets . Trivial regurgitation. - Left atrium: The atrium was mildly dilated. - Tricuspid valve: Trivial regurgitation. - Pulmonic valve: Mild regurgitation. - Pericardium, extracardiac: A prominent pericardial fat pad was present. There was no pericardial  effusion. Transthoracic echocardiography. M-mode, complete 2D, spectral Doppler, and color Doppler. Height: Height: 157.5cm. Height: 62in. Weight: Weight: 69.9kg. Weight: 153.7lb. Body mass index: BMI: 28.2kg/m^2. Body surface area: BSA: 1.53m^2. Patient status: Inpatient   Micro: No results found for this or any previous visit (from the past 240 hour(s)).  Studies/Results: No results found.  Medications:  Scheduled:    . amLODipine  5 mg Oral Daily  . aspirin EC  81 mg Oral Daily  . atenolol  50 mg Oral Daily  . enoxaparin (LOVENOX) injection  70 mg Subcutaneous BID  . influenza  inactive virus vaccine  0.5 mL Intramuscular Tomorrow-1000  . pantoprazole  40 mg Oral Q1200  . potassium chloride  40 mEq Oral TID  . regadenoson      . simvastatin  20 mg Oral QHS  . sodium chloride  3 mL Intravenous Q12H  . sodium chloride      . DISCONTD: atenolol  100 mg Oral Daily   Continuous:  ZOX:WRUE & mag hydroxide-simeth, HYDROcodone-acetaminophen, LORazepam, magnesium hydroxide, meclizine, morphine injection, nitroGLYCERIN, ondansetron (ZOFRAN) IV, ondansetron, technetium sestamibi, technetium sestamibi, traZODone  Assessment: Principal Problem:  *Chest pain Active Problems:  HYPERLIPIDEMIA  HYPOKALEMIA  ANXIETY  HYPERTENSION  ATHEROSCLEROTIC CARDIOVASCULAR DISEASE  Diastolic dysfunction    The patient is currently chest pain free and hemodynamically stable, though mildly hypertensive. Her cardiac enzymes are essentially negative. Her echocardiogram reveals grade 1 diastolic dysfunction, but no evidence of acute decompensated congestive heart failure. It also revealed akinesis of the basal inferior myocardium, which is concerning. She does have a history of coronary artery disease. She is hypokalemic, but the etiology is unclear at this time. She is neither on diuretic therapy nor does she have hypomagnesemia. I discussed the patient briefly tonight with Dr. Dietrich Pates. The plan is  for him to evaluate her further with a Myoview stress test in the morning.  Plan: 1. Continue supportive treatment. 2. Continue to replete/supplement potassium chloride. 3. Myoview stress test scheduled for 11/25/2011.      LOS: 2 days   Cindy Brandt 11/25/2011, 10:47 AM

## 2011-11-24 NOTE — Progress Notes (Signed)
UR Chart Review Completed  

## 2011-11-24 NOTE — Care Management Note (Unsigned)
    Page 1 of 1   11/25/2011     4:04:42 PM   CARE MANAGEMENT NOTE 11/25/2011  Patient:  Cindy Brandt, Cindy Brandt   Account Number:  [id number]  Date Initiated:  11/24/2011  Documentation initiated by:  Rosemary Holms  Subjective/Objective Assessment:   Pt admitted for observation of Chest pain. Spoke w/ pt who states she is no longer in pain. Lives at home with her spouse. Awaiting Cardiology.     Action/Plan:   DC home   Anticipated DC Date:  11/25/2011   Anticipated DC Plan:  HOME/SELF CARE      DC Planning Services  CM consult      Choice offered to / List presented to:             Status of service:  In process, will continue to follow Medicare Important Message given?  YES (If response is "NO", the following Medicare IM given date fields will be blank) Date Medicare IM given:  11/25/2011 Date Additional Medicare IM given:    Discharge Disposition:    Per UR Regulation:    If discussed at Long Length of Stay Meetings, dates discussed:    Comments:  11/25/11 1445 Skylan Gift RN BSN CM Pt states she is eager to go home now that the stress test is over.  11/24/11 1415 Oracio Galen Leanord Hawking RN BSN CM

## 2011-11-24 NOTE — Progress Notes (Signed)
Cindy Brandt  78 y.o.  female  Subjective: Patient admitted with a three-day history of malaise and dizziness +12 hours of chest discomfort. She denies dyspnea or diaphoresis. Chest pain was still present when she presented to the emergency department but subsequently resolved. She's had no recurrence, but has had some left upper extremity discomfort.  Allergy: Tylenol  Objective: Vital signs in last 24 hours: Temp:  [98.5 F (36.9 C)-98.7 F (37.1 C)] 98.5 F (36.9 C) (09/24 0634) Pulse Rate:  [67-73] 73  (09/24 0634) Resp:  [18] 18  (09/24 0634) BP: (147-157)/(81-82) 157/82 mmHg (09/24 0634) SpO2:  [93 %-95 %] 93 % (09/24 0634)  69.854 kg (154 lb) Body mass index is 28.17 kg/(m^2).  Weight change:  Last BM Date: 11/22/11 General- Well developed; no acute distress; mildly overweight Neck- No JVD, no carotid bruits Lungs- minor expiratory wheeze and prolonged expiratory phase Cardiovascular- normal PMI; normal S1 and S2; grade 1-2/6 basilar systolic ejection murmur; prominent splitting of the second heart sound Abdomen- normal bowel sounds; soft and non-tender without masses or organomegaly Skin- Warm, no significant lesions Extremities- 1+ distal pulses; no edema  Lab Results: Cardiac Markers:   Basename 11/23/11 1954 11/23/11 1418  TROPONINI <0.30 <0.30   CBC:   Basename 11/23/11 0942  WBC 4.0  HGB 13.7  HCT 39.5  PLT 227   BMET:  Basename 11/24/11 0532 11/23/11 0942  NA -- 138  K 3.0* 2.7*  CL -- 100  CO2 -- 30  GLUCOSE -- 141*  BUN -- 9  CREATININE -- 0.65  CALCIUM -- 9.1   Hepatic Function:   Basename 11/23/11 1418  PROT 7.3  ALBUMIN 3.6  AST 21  ALT 13  ALKPHOS 54  BILITOT 0.6  BILIDIR 0.1  IBILI 0.5   Imaging Studies/Results: Dg Chest Port 11/23/11: Mild cardiomegaly; bibasilar atelectasis; mild vascular redistribution  Imaging: Imaging results have been reviewed  Medications:  I have reviewed the patient's current medications. Scheduled:    . amLODipine  5 mg Oral Daily  . aspirin EC  81 mg Oral Daily  . atenolol  100 mg Oral Daily  . enoxaparin (LOVENOX) injection  70 mg Subcutaneous BID  . pantoprazole  40 mg Oral Q1200  . potassium chloride  40 mEq Oral TID  . simvastatin  20 mg Oral QHS   Principal Problem:  *Chest pain Active Problems:  HYPERLIPIDEMIA  HYPOKALEMIA  ANXIETY  HYPERTENSION  ATHEROSCLEROTIC CARDIOVASCULAR DISEASE   Assessment/Plan: Chest pain: Symptoms are atypical; normal EKG and cardiac markers are reassuring.  We will proceed with a stress Myoview study in the morning with the possibility of utilizing pharmacologic stress if patient is unable to exercise adequately. She has evidence for bronchospasm on physical examination, but no significant pulmonary symptoms and no real history to suggest a URI or bronchitis.  LOS: 1 day   [place] Bing 11/24/2011, 1:04 PM

## 2011-11-24 NOTE — ED Provider Notes (Signed)
Medical screening examination/treatment/procedure(s) were conducted as a shared visit with non-physician practitioner(s) and myself.  I personally evaluated the patient during the encounter   Benny Lennert, MD 11/24/11 [phone]

## 2011-11-25 ENCOUNTER — Encounter (HOSPITAL_COMMUNITY): Payer: Self-pay

## 2011-11-25 ENCOUNTER — Inpatient Hospital Stay (HOSPITAL_COMMUNITY): Payer: Medicare Other

## 2011-11-25 LAB — CBC
HCT: 37 % (ref 36.0–46.0)
MCHC: 34.9 g/dL (ref 30.0–36.0)
Platelets: 212 10*3/uL (ref 150–400)
RDW: 12.4 % (ref 11.5–15.5)
WBC: 4.8 10*3/uL (ref 4.0–10.5)

## 2011-11-25 LAB — LIPID PANEL
LDL Cholesterol: 96 mg/dL (ref 0–99)
Triglycerides: 127 mg/dL (ref <150)

## 2011-11-25 LAB — BASIC METABOLIC PANEL
CO2: 27 mEq/L (ref 19–32)
Calcium: 9 mg/dL (ref 8.4–10.5)
Creatinine, Ser: 0.72 mg/dL (ref 0.50–1.10)
GFR calc non Af Amer: 80 mL/min — ABNORMAL LOW (ref >90)
Sodium: 140 mEq/L (ref 135–145)

## 2011-11-25 LAB — MAGNESIUM: Magnesium: 2 mg/dL (ref 1.5–2.5)

## 2011-11-25 MED ORDER — POTASSIUM CHLORIDE CRYS ER 20 MEQ PO TBCR
20.0000 meq | EXTENDED_RELEASE_TABLET | Freq: Every day | ORAL | Status: DC
  Administered 2011-11-25: 20 meq via ORAL
  Filled 2011-11-25: qty 1

## 2011-11-25 MED ORDER — REGADENOSON 0.4 MG/5ML IV SOLN
INTRAVENOUS | Status: AC
  Administered 2011-11-25: 0.4 mg via INTRAVENOUS
  Filled 2011-11-25: qty 5

## 2011-11-25 MED ORDER — SODIUM CHLORIDE 0.9 % IJ SOLN
INTRAMUSCULAR | Status: AC
  Administered 2011-11-25: 10 mL via INTRAVENOUS
  Filled 2011-11-25: qty 10

## 2011-11-25 MED ORDER — POTASSIUM CHLORIDE CRYS ER 20 MEQ PO TBCR: 20.0000 meq | EXTENDED_RELEASE_TABLET | Freq: Every day | ORAL | Status: DC

## 2011-11-25 MED ORDER — PANTOPRAZOLE SODIUM 40 MG PO TBEC: 40.0000 mg | DELAYED_RELEASE_TABLET | Freq: Every day | ORAL | Status: DC

## 2011-11-25 MED ORDER — TECHNETIUM TC 99M SESTAMIBI - CARDIOLITE
30.0000 | Freq: Once | INTRAVENOUS | Status: AC | PRN
  Administered 2011-11-25: 11:00:00 28.5 via INTRAVENOUS

## 2011-11-25 MED ORDER — TECHNETIUM TC 99M SESTAMIBI - CARDIOLITE
10.0000 | Freq: Once | INTRAVENOUS | Status: AC | PRN
  Administered 2011-11-25: 09:00:00 9.3 via INTRAVENOUS

## 2011-11-25 NOTE — Progress Notes (Signed)
Stress Lab Nurses Notes - Naysa Puskas 11/25/2011 Reason for doing test: dizziness & Chest discomfort Type of test: Steffanie Dunn / Inpatient Rm 319 Nurse performing test: Parke Poisson, RN Nuclear Medicine Tech: Lyndel Pleasure Echo Tech: Not Applicable MD performing test: R. Dietrich Pates Family MD: Fisher Test explained and consent signed: yes IV started: Saline lock flushed, No redness or edema and Saline lock in place Symptoms: None Treatment/Intervention: None Reason test stopped: protocol completed After recovery IV was: No redness or edema and Saline lock in place Patient to return to Nuc. Med at : 11:30 Patient discharged: Transported back to room 319 via wc Patient's Condition upon discharge was: stable Comments: During test BP 131/62 & HR 86.  Recovery BP 139/77 & HR 76.  No symptoms noted at this time. Erskine Speed T

## 2011-11-25 NOTE — Progress Notes (Signed)
Subjective:  No further chest pain. Worried about walking on the treadmill.  Objective:  Vital Signs in the last 24 hours: Temp:  [98.6 F (37 C)-99.2 F (37.3 C)] 98.8 F (37.1 C) (09/25 0510) Pulse Rate:  [64-73] 73  (09/25 0510) Resp:  [16-18] 18  (09/25 0510) BP: (132-153)/(75-83) 146/75 mmHg (09/25 0510) SpO2:  [92 %-95 %] 93 % (09/25 0510)  Intake/Output from previous day: 09/24 0701 - 09/25 0700 In: 483 [P.O.:480; I.V.:3] Out: -  Intake/Output from this shift:    Physical Exam: NECK: Without JVD, HJR, or bruit LUNGS: Decreased breath sounds but Clear anterior, posterior, lateral HEART: Regular rate and rhythm, no murmur, gallop, rub, bruit, thrill, or heave EXTREMITIES: Without cyanosis, clubbing, or edema  Lab Results:  [facility] 11/25/11 0437 11/23/11 0942  WBC 4.8 4.0  HGB 12.9 13.7  PLT 212 227    Basename 11/25/11 0437 11/24/11 0532 11/23/11 0942  NA 140 -- 138  K 3.9 3.0* --  CL 107 -- 100  CO2 27 -- 30  GLUCOSE 125* -- 141*  BUN 8 -- 9  CREATININE 0.72 -- 0.65    Basename 11/23/11 1954 11/23/11 1418  TROPONINI <0.30 <0.30   Hepatic Function Panel  Basename 11/23/11 1418  PROT 7.3  ALBUMIN 3.6  AST 21  ALT 13  ALKPHOS 54  BILITOT 0.6  BILIDIR 0.1  IBILI 0.5    Basename 11/25/11 0437  CHOL 162   Imaging:  Pharmacologic stress nuclear study-no evidence for infarction or ischemia; normal left ventricular systolic function.  Assessment/Plan:  1. Recent onset chest pain symptoms concerning for unstable angina. ECG shows no acute ST segment changes at this point, initial cardiac markers are normal. She has not undergone ischemic evaluation since 2006.For stress myoview today.  2. Known CAD with occlusion of the RCA associated with collaterals, and nonobstructive left system disease documented at last cardiac catheterization 2005.  3. Hyperlipidemia, on statin therapy.  4. Hypertension.  5. Hypokalemia, not on obvious diuretic as an  outpatient. Currently being repleted.  Jacolyn Reedy 11/25/2011, 8:34 AM  Cardiology Attending Patient interviewed and examined. Discussed with Jacolyn Reedy, PA-C.  Above note annotated and modified based upon my findings.  Negative stress test indicates a very low risk for a significant cardiovascular event in the near or medium-term. Under the circumstances, medical management of her coronary disease is a reasonable option. We will arrange office followup within the next 4 weeks.  Kittrell Bing, MD 11/25/2011, 5:24 PM

## 2011-11-25 NOTE — Discharge Summary (Signed)
Physician Discharge Summary  Cindy Brandt UJW:[id number] DOB: [birth year] DOA: 11/23/2011  PCP: Kirk Ruths, MD  Admit date: 11/23/2011 Discharge date: 11/25/2011  Recommendations for Outpatient Follow-up:  1. The patient was discharged to home in improved and stable condition. She will followup with Dr. Regino Schultze next week. Her serum potassium will need to be rechecked. Dr. Marvel Plan office will call her with a followup appointment.  Discharge Diagnoses:  1. Chest pain. Myocardial infarction ruled out. Myoview cardiac stress test negative. 2. History of arthrosclerotic cardiovascular disease. 3. Hypokalemia. The patient's serum potassium was 2.7 on admission and 3.9 at the time of discharge. (Her serum potassium will need to be monitored closely in the outpatient setting). 4. Grade 1 diastolic dysfunction ejection fraction of 60-65%, and akinesis of the inferior basal myocardium, per 2-D echocardiogram.  5. Hyperlipidemia. The patient's fasting lipid profile revealed a total less trouble 162, triglycerides 127, HDL cholesterol 41, and LDL cholesterol of 96. 6. Hypertension. 7. Chronic anxiety. 8. Osteoporosis. 9. Mild hyperglycemia. Hemoglobin A1c 6.1. Outpatient monitoring recommended.  Discharge Condition: Improved.  Diet recommendation: Heart healthy.  Filed Weights   11/23/11 0851  Weight: 69.854 kg (154 lb)    History of present illness:  The patient is a 78 year old woman with a history significant for coronary artery disease, hypertension, and hyperlipidemia, who presented to the emergency department on 11/23/2011 with a chief complaint of chest pain. In the emergency department, she was afebrile, hypertensive with a blood pressure 162/78, and otherwise hemodynamically stable. Her lab data were significant for a serum potassium of 2.7, glucose of 141, and normal troponin I. Her EKG revealed normal sinus rhythm with an incomplete right bundle branch block. Her chest x-ray  revealed mild cardiomegaly with vascular congestion and bibasilar atelectasis. She was admitted for further evaluation and management.  Hospital Course:  The patient was continued on aspirin, beta blockade therapy, and her other chronic medications. Full dose Lovenox was added empirically as the patient was thought to have unstable angina. Sublingual nitroglycerin was ordered as needed. Protonix was started prophylactically and in the setting of chronic alendronate therapy. Supportive treatment was initiated. Potassium chloride supplementation was given orally. Her serum potassium was monitored daily. She had no history of diabetes. However, her nonfasting glucose on admission was 141. Hemoglobin A1c was ordered and it was 6.1. Further outpatient monitoring is recommended. For further evaluation, a number of studies were ordered. Her magnesium level was within normal limits at 1.9. All of her cardiac enzymes, namely troponin I., were negative. Her pro BNP was only 157. The results of her fasting lipid profile were dictated above. Her TSH was within normal limits at 1.35. Her 2-D echocardiogram revealed preserved LV function with ejection fraction of 60-65%, inferior-basal akinesis and grade 1 diastolic dysfunction.   Cardiology was consulted. Dr. Diona Browner provided the initial consultation and evaluation. He agreed with medical management. He recommended further evaluation with either a cardiac catheterization or alternatively a Myoview stress test. The decision was deferred to Dr. Dietrich Pates the next day. Following Dr. Dietrich Pates assessment, he recommended a Myoview stress test. The results of the Myoview stress test, per Dr. Marvel Plan verbal report, was essentially negative.  The patient became chest pain-free. Her serum potassium improved to 3.9. She was advised to continue her chronic medications. At the time of discharge, Protonix was added to her medication regimen. She was also instructed not to lay flat  for at least 2 hours after she takes alendronate at home. She was also discharged on  20 mEq potassium chloride daily. This dose may not be enough, and therefore, she will need to have her serum potassium reassessed next week at her followup appointments.    Procedures:  Myoview stress test    2-D echocardiogram  Consultations:  [place] Bing, M.D.   Discharge Exam: see exam on progress note from earlier today.  Filed Vitals:   11/24/11 1340 11/24/11 2050 11/25/11 0510 11/25/11 1444  BP: 153/83 132/78 146/75 122/74  Pulse: 64 67 73 62  Temp: 98.6 F (37 C) 99.2 F (37.3 C) 98.8 F (37.1 C) 97.5 F (36.4 C)  TempSrc: Oral Oral Oral Oral  Resp: 18 16 18 18   Height:      Weight:      SpO2: 92% 95% 93% 97%     Discharge Instructions  Discharge Orders    Future Orders Please Complete By Expires   Diet - low sodium heart healthy      Increase activity slowly      Discharge instructions      Comments:   Your blood potassium level should be rechecked at least once monthly. Discuss further with your doctor.       Medication List     As of 11/25/2011  6:34 PM    TAKE these medications         alendronate 70 MG tablet   Commonly known as: FOSAMAX   Take 70 mg by mouth every 7 (seven) days. Takes on Mondays      ALLERGY MEDICINE PO   Take 1 tablet by mouth daily as needed. Allergies      amLODipine 10 MG tablet   Commonly known as: NORVASC   Take 5 mg by mouth daily.      aspirin 81 MG tablet   Take 81 mg by mouth daily.      atenolol 100 MG tablet   Commonly known as: TENORMIN   Take 100 mg by mouth daily.      HYDROcodone-acetaminophen 5-500 MG per tablet   Commonly known as: VICODIN   Take 1 tablet by mouth every 6 (six) hours as needed. Pain      LORazepam 1 MG tablet   Commonly known as: ATIVAN   Take 1 mg by mouth every 6 (six) hours as needed. Anxiety      meclizine 25 MG tablet   Commonly known as: ANTIVERT   Take 25 mg by mouth 4 (four) times  daily. Taking for 10 days, started on 11/19/2011      nitroGLYCERIN 0.4 MG SL tablet   Commonly known as: NITROSTAT   Place 0.4 mg under the tongue every 5 (five) minutes as needed. Chest Pain      pantoprazole 40 MG tablet   Commonly known as: PROTONIX   Take 1 tablet (40 mg total) by mouth daily at 12 noon.      potassium chloride SA 20 MEQ tablet   Commonly known as: K-DUR,KLOR-CON   Take 1 tablet (20 mEq total) by mouth daily.      simvastatin 20 MG tablet   Commonly known as: ZOCOR   Take 20 mg by mouth at bedtime.           Follow-up Information    Follow up with Kirk Ruths, MD. On 12/01/2011. ( At 11:00am. Your blood potassium level needs to be rechecked.)    Contact information:   [address] [place] [address] [phone]  Follow up with Diomede Bing, MD. (His office will call with a follow up appointment.)    Contact information:   [address] [place] [address] [phone]           The results of significant diagnostics from this hospitalization (including imaging, microbiology, ancillary and laboratory) are listed below for reference.    Significant Diagnostic Studies: Dg Chest Port 1 View  11/23/2011  *RADIOLOGY REPORT*  Clinical Data: Hypertension, chest pain.  PORTABLE CHEST - 1 VIEW  Comparison: 12/25/2008  Findings: Mild cardiomegaly.  Bibasilar densities, likely atelectasis.  Mild vascular congestion.  No overt edema.  No effusions or acute bony abnormality.  IMPRESSION: Mild cardiomegaly with vascular congestion and bibasilar atelectasis.   Original Report Authenticated By: Cyndie Chime, M.D.     Microbiology: No results found for this or any previous visit (from the past 240 hour(s)).   Labs: Basic Metabolic Panel:  Lab 11/25/11 9147 11/24/11 0532 11/23/11 1418 11/23/11 0942  NA 140 -- -- 138  K 3.9 3.0* -- 2.7*  CL 107 -- -- 100  CO2 27 -- -- 30  GLUCOSE 125* -- -- 141*  BUN 8 --  -- 9  CREATININE 0.72 -- -- 0.65  CALCIUM 9.0 -- -- 9.1  MG 2.0 -- 1.9 --  PHOS -- -- -- --   Liver Function Tests:  Lab 11/23/11 1418  AST 21  ALT 13  ALKPHOS 54  BILITOT 0.6  PROT 7.3  ALBUMIN 3.6   No results found for this basename: LIPASE:5,AMYLASE:5 in the last 168 hours No results found for this basename: AMMONIA:5 in the last 168 hours CBC:  Lab 11/25/11 0437 11/23/11 0942  WBC 4.8 4.0  NEUTROABS -- 2.0  HGB 12.9 13.7  HCT 37.0 39.5  MCV 95.1 93.8  PLT 212 227   Cardiac Enzymes:  Lab 11/23/11 1954 11/23/11 1418 11/23/11 0942  CKTOTAL -- -- --  CKMB -- -- --  CKMBINDEX -- -- --  TROPONINI <0.30 <0.30 <0.30   BNP: BNP (last 3 results)  Basename 11/23/11 1418  PROBNP 157.1   CBG: No results found for this basename: GLUCAP:5 in the last 168 hours  Time coordinating discharge: Greater than 30  minutes  Signed:  Dael Howland  [facility] 11/25/2011, 6:34 PM

## 2011-11-25 NOTE — Progress Notes (Signed)
Subjective: The patient has no complaints of chest pain. She is a little anxious about the stress test this morning and whether or not her knees can withstand the treadmill.  Objective: Vital signs in last 24 hours: Filed Vitals:   11/24/11 0634 11/24/11 1340 11/24/11 2050 11/25/11 0510  BP: 157/82 153/83 132/78 146/75  Pulse: 73 64 67 73  Temp: 98.5 F (36.9 C) 98.6 F (37 C) 99.2 F (37.3 C) 98.8 F (37.1 C)  TempSrc: Oral Oral Oral Oral  Resp: 18 18 16 18   Height:      Weight:      SpO2: 93% 92% 95% 93%    Intake/Output Summary (Last 24 hours) at 11/25/11 1054 Last data filed at 11/24/11 1700  Gross per 24 hour  Intake    483 ml  Output      0 ml  Net    483 ml    Weight change:   Physical exam: General: Pleasant 78 year old [name] sitting up in bed, in no acute distress. Lungs: Clear to auscultation bilaterally Heart: S1, S2, with a 1-2/6 systolic murmur Abdomen: Positive bowel sounds, soft, nontender, nondistended. Extremities: No pedal edema. Neurologic: She is alert and oriented x3. Cranial nerves II through XII are intact.  Lab Results: Basic Metabolic Panel:  Basename 11/25/11 0437 11/24/11 0532 11/23/11 1418 11/23/11 0942  NA 140 -- -- 138  K 3.9 3.0* -- --  CL 107 -- -- 100  CO2 27 -- -- 30  GLUCOSE 125* -- -- 141*  BUN 8 -- -- 9  CREATININE 0.72 -- -- 0.65  CALCIUM 9.0 -- -- 9.1  MG 2.0 -- 1.9 --  PHOS -- -- -- --   Liver Function Tests:  Basename 11/23/11 1418  AST 21  ALT 13  ALKPHOS 54  BILITOT 0.6  PROT 7.3  ALBUMIN 3.6   No results found for this basename: LIPASE:2,AMYLASE:2 in the last 72 hours No results found for this basename: AMMONIA:2 in the last 72 hours CBC:  Basename 11/25/11 0437 11/23/11 0942  WBC 4.8 4.0  NEUTROABS -- 2.0  HGB 12.9 13.7  HCT 37.0 39.5  MCV 95.1 93.8  PLT 212 227   Cardiac Enzymes:  Basename 11/23/11 1954 11/23/11 1418 11/23/11 0942  CKTOTAL -- -- --  CKMB -- -- --  CKMBINDEX  -- -- --  TROPONINI <0.30 <0.30 <0.30   BNP:  Basename 11/23/11 1418  PROBNP 157.1   D-Dimer: No results found for this basename: DDIMER:2 in the last 72 hours CBG: No results found for this basename: GLUCAP:6 in the last 72 hours Hemoglobin A1C:  Basename 11/23/11 1418  HGBA1C 6.1*   Fasting Lipid Panel:  Basename 11/25/11 0437  CHOL 162  HDL 41  LDLCALC 96  TRIG 127  CHOLHDL 4.0  LDLDIRECT --   Thyroid Function Tests:  Basename 11/23/11 1418  TSH 1.354  T4TOTAL --  FREET4 --  T3FREE --  THYROIDAB --   Anemia Panel: No results found for this basename: VITAMINB12,FOLATE,FERRITIN,TIBC,IRON,RETICCTPCT in the last 72 hours Coagulation: No results found for this basename: LABPROT:2,INR:2 in the last 72 hours Urine Drug Screen: Drugs of Abuse  No results found for this basename: labopia,  cocainscrnur,  labbenz,  amphetmu,  thcu,  labbarb    Alcohol Level: No results found for this basename: ETH:2 in the last 72 hours Urinalysis: No results found for this basename: COLORURINE:2,APPERANCEUR:2,LABSPEC:2,PHURINE:2,GLUCOSEU:2,HGBUR:2,BILIRUBINUR:2,KETONESUR:2,PROTEINUR:2,UROBILINOGEN:2,NITRITE:2,LEUKOCYTESUR:2 in the last 72 hours Misc. Labs:  Echo:Study Conclusions  - Left ventricle: The cavity  size was normal. Wall thickness was increased in a pattern of moderate LVH. Systolic function was normal. The estimated ejection fraction was in the range of 60% to 65%. There is akinesis of the basalinferior myocardium. Doppler parameters are consistent with abnormal left ventricular relaxation (grade 1 diastolic dysfunction). - Aortic valve: Mildly calcified annulus. Trileaflet; mildly thickened leaflets. - Mitral valve: Calcified annulus. Mildly thickened leaflets . Trivial regurgitation. - Left atrium: The atrium was mildly dilated. - Tricuspid valve: Trivial regurgitation. - Pulmonic valve: Mild regurgitation. - Pericardium, extracardiac: A prominent pericardial  fat pad was present. There was no pericardial effusion. Transthoracic echocardiography. M-mode, complete 2D, spectral Doppler, and color Doppler. Height: Height: 157.5cm. Height: 62in. Weight: Weight: 69.9kg. Weight: 153.7lb. Body mass index: BMI: 28.2kg/m^2. Body surface area: BSA: 1.4m^2. Patient status: Inpatient   Micro: No results found for this or any previous visit (from the past 240 hour(s)).  Studies/Results: No results found.  Medications:  Scheduled:    . amLODipine  5 mg Oral Daily  . aspirin EC  81 mg Oral Daily  . atenolol  50 mg Oral Daily  . enoxaparin (LOVENOX) injection  70 mg Subcutaneous BID  . influenza  inactive virus vaccine  0.5 mL Intramuscular Tomorrow-1000  . pantoprazole  40 mg Oral Q1200  . potassium chloride  20 mEq Oral Daily  . potassium chloride  40 mEq Oral TID  . regadenoson      . simvastatin  20 mg Oral QHS  . sodium chloride  3 mL Intravenous Q12H  . sodium chloride      . DISCONTD: atenolol  100 mg Oral Daily   Continuous:  BMW:UXLK & mag hydroxide-simeth, HYDROcodone-acetaminophen, LORazepam, magnesium hydroxide, meclizine, morphine injection, nitroGLYCERIN, ondansetron (ZOFRAN) IV, ondansetron, technetium sestamibi, technetium sestamibi, traZODone  Assessment: Principal Problem:  *Chest pain Active Problems:  HYPERTENSION  ATHEROSCLEROTIC CARDIOVASCULAR DISEASE  HYPERLIPIDEMIA  HYPOKALEMIA  ANXIETY  Diastolic dysfunction    The patient is currently chest pain free and hemodynamically stable, though mildly hypertensive. Her cardiac enzymes are essentially negative. Her echocardiogram reveals grade 1 diastolic dysfunction, but no evidence of acute decompensated congestive heart failure. It also revealed akinesis of the basal inferior myocardium, which is concerning. She does have a history of coronary artery disease. She was hypokalemic, but following potassium chloride supplementation, her serum potassium has normalized. It is  likely she will need long-term daily potassium chloride supplementation. Etiology is uncertain given that her magnesium level is within normal limits. Fasting lipid panel results are noted for a total cholesterol of 162 and LDL cholesterol of 96. Myoview stress test is pending for today.  Plan: 1. Continue treatment as above. 2. Stress test today. We'll followup on the results and cardiology's recommendation. 3. Daily dosing of potassium chloride.      LOS: 2 days   Cindy Brandt 11/25/2011, 10:54 AM

## 2011-11-25 NOTE — Plan of Care (Signed)
Problem: Discharge Progression Outcomes Goal: Other Discharge Outcomes/Goals Outcome: Completed/Met Date Met:  11/25/11 Discharged to home with spouse

## 2011-12-01 ENCOUNTER — Encounter (HOSPITAL_COMMUNITY): Payer: Self-pay | Admitting: Emergency Medicine

## 2011-12-01 ENCOUNTER — Encounter: Payer: Self-pay | Admitting: *Deleted

## 2011-12-24 ENCOUNTER — Encounter: Payer: Self-pay | Admitting: Physician Assistant

## 2011-12-31 ENCOUNTER — Encounter: Payer: Self-pay | Admitting: Physician Assistant

## 2011-12-31 ENCOUNTER — Ambulatory Visit (INDEPENDENT_AMBULATORY_CARE_PROVIDER_SITE_OTHER): Payer: Medicare Other | Admitting: Physician Assistant

## 2011-12-31 VITALS — BP 140/86 | HR 62 | Ht 60.0 in | Wt 152.0 lb

## 2011-12-31 DIAGNOSIS — I2510 Atherosclerotic heart disease of native coronary artery without angina pectoris: Secondary | ICD-10-CM

## 2011-12-31 DIAGNOSIS — E876 Hypokalemia: Secondary | ICD-10-CM

## 2011-12-31 DIAGNOSIS — I10 Essential (primary) hypertension: Secondary | ICD-10-CM

## 2011-12-31 DIAGNOSIS — I519 Heart disease, unspecified: Secondary | ICD-10-CM

## 2011-12-31 DIAGNOSIS — I5189 Other ill-defined heart diseases: Secondary | ICD-10-CM

## 2011-12-31 NOTE — Assessment & Plan Note (Signed)
She has a history of coronary artery disease status post occluded RCA with unsuccessful PCI in 2003. She had nonobstructive left system in 2005.She had recent admission with chest pain and ruled out for an MI. Stress Myoview with negative for ischemia. She has had no further chest pain. Continue medical therapy.

## 2011-12-31 NOTE — Assessment & Plan Note (Addendum)
Patient's blood pressure is borderline today. I had long discussion with her about her diet and she says she knows she needs to cut back on her sodium intake. I am reluctant to add a diuretic because of her hypokalemia in the hospital, but she may need this in the future with her diastolic dysfunction and mild edema.Recommend 2 g sodium diet.

## 2011-12-31 NOTE — Patient Instructions (Addendum)
Your physician recommends that you continue on your current medications as directed. Please refer to the Current Medication list given to you today.  Your physician recommends that you schedule a follow-up appointment in: 2 months with Dr.Rothbart  Follow a low sodium diet.  Please see the information sheet below  2 Gram Low Sodium Diet A 2 gram sodium diet restricts the amount of sodium in the diet to no more than 2 g or 2000 mg daily. Limiting the amount of sodium is often used to help lower blood pressure.   QUICK TIPS  Do not add salt to food.  Avoid convenience items and fast food.  Choose unsalted snack foods.  Buy lower sodium products, often labeled as "lower sodium" or "no salt added."  Check food labels to learn how much sodium is in 1 serving.  When eating at a restaurant, ask that your food be prepared with less salt or none, if possible.  READING FOOD LABELS FOR SODIUM INFORMATION The nutrition facts label is a good place to find how much sodium is in foods. Look for products with no more than 500 to 600 mg of sodium per meal and no more than 150 mg per serving. Remember that 2 g = 2000 mg. The food label may also list foods as:  Sodium-free: Less than 5 mg in a serving.  Very low sodium: 35 mg or less in a serving.  Low-sodium: 140 mg or less in a serving.  Light in sodium: 50% less sodium in a serving. For example, if a food that usually has 300 mg of sodium is changed to become light in sodium, it will have 150 mg of sodium.  Reduced sodium: 25% less sodium in a serving. For example, if a food that usually has 400 mg of sodium is changed to reduced sodium, it will have 300 mg of sodium.   CHOOSING FOODS Grains  Avoid: Salted crackers and snack items. Some cereals, including instant hot cereals. Bread stuffing and biscuit mixes. Seasoned rice or pasta mixes.  Choose: Unsalted snack items. Low-sodium cereals, oats, puffed wheat and rice, shredded wheat.  English muffins and bread. Pasta. Meats  Avoid: Salted, canned, smoked, spiced, pickled meats, including fish and poultry. Bacon, ham, sausage, cold cuts, hot dogs, anchovies.  Choose: Low-sodium canned tuna and salmon. Fresh or frozen meat, poultry, and fish. Dairy  Avoid: Processed cheese and spreads. Cottage cheese. Buttermilk and condensed milk. Regular cheese.  Choose: Milk. Low-sodium cottage cheese. Yogurt. Sour cream. Low-sodium cheese. Fruits and Vegetables  Avoid: Regular canned vegetables. Regular canned tomato sauce and paste. Frozen vegetables in sauces. Olives. Rosita Fire. Relishes. Sauerkraut.  Choose: Low-sodium canned vegetables. Low-sodium tomato sauce and paste. Frozen or fresh vegetables. Fresh and frozen fruit. Condiments  Avoid: Canned and packaged gravies. Worcestershire sauce. Tartar sauce. Barbecue sauce. Soy sauce. Steak sauce. Ketchup. Onion, garlic, and table salt. Meat flavorings and tenderizers.  Choose: Fresh and dried herbs and spices. Low-sodium varieties of mustard and ketchup. Lemon juice. Tabasco sauce. Horseradish.   SAMPLE 2 GRAM SODIUM MEAL PLAN Breakfast / Sodium (mg)  1 cup low-fat milk / 143 mg  2 slices whole-wheat toast / 270 mg  1 tbs heart-healthy margarine / 153 mg  1 hard-boiled egg / 139 mg  1 small orange / 0 mg Lunch / Sodium (mg)  1 cup raw carrots / 76 mg   cup hummus / 298 mg  1 cup low-fat milk / 143 mg   cup red grapes / 2  mg  1 whole-wheat pita bread / 356 mg Dinner / Sodium (mg)  1 cup whole-wheat pasta / 2 mg  1 cup low-sodium tomato sauce / 73 mg  3 oz lean ground beef / 57 mg  1 small side salad (1 cup raw spinach leaves,  cup cucumber,  cup yellow bell pepper) with 1 tsp olive oil and 1 tsp red wine vinegar / 25 mg Snack / Sodium (mg)  1 container low-fat vanilla yogurt / 107 mg  3 graham cracker squares / 127 mg Nutrient Analysis  Calories: 2033  Protein: 77 g  Carbohydrate: 282 g  Fat:  72 g  Sodium: 1971 mg Document Released: 02/16/2005 Document Revised: 05/11/2011 Document Reviewed: 05/20/2009 [facility] Patient Information 2013 [place], [place].

## 2011-12-31 NOTE — Progress Notes (Addendum)
HPI:   This is a very pleasant 78 year old African American female patient of Dr. Regino Schultze who was seen in consult at the hospital by Dr. Dietrich Pates. She was admitted with chest pain and ruled out for an MI. She had a stress Myoview that was negative. 2-D echo showed an ejection fraction of 60-65% with grade 1 diastolic dysfunction and akinesis of the anterior basal wall. She also was found to be hypokalemic with a potassium of 2.7. She was put on replacement despite not being on a diuretic. She had lab work 2 weeks ago by her primary care and was told her was potassium was normal.  She has a history of coronary artery disease status post occluded RCA with unsuccessful PCI in 2003. She had nonobstructive left system in 2005.She has been managed medically.   The patient denies any chest pain, palpitations, dyspnea, dyspnea on exertion, dizziness, or presyncope. She says her ankles swell at the end of the day. She says her blood pressure has been normal. She does eat some salt and knows she could cut back further.  Allergies:  -- Tylenol (Acetaminophen) -- Other (See Comments)   --  Funny feeling in chest.  Current Outpatient Prescriptions on File Prior to Visit: alendronate (FOSAMAX) 70 MG tablet, Take 70 mg by mouth every 7 (seven) days. Takes on Mondays, Disp: , Rfl:  amLODipine (NORVASC) 10 MG tablet, Take 5 mg by mouth daily. , Disp: , Rfl:   aspirin 81 MG tablet, Take 81 mg by mouth daily. , Disp: , Rfl:  atenolol (TENORMIN) 100 MG tablet, Take 100 mg by mouth daily. , Disp: , Rfl:  Homeopathic Products (ALLERGY MEDICINE PO), Take 1 tablet by mouth daily as needed. Allergies, Disp: , Rfl:  HYDROcodone-acetaminophen (VICODIN) 5-500 MG per tablet, Take 1 tablet by mouth every 6 (six) hours as needed. Pain, Disp: , Rfl:  LORazepam (ATIVAN) 1 MG tablet, Take 1 mg by mouth every 6 (six) hours as needed. Anxiety, Disp: , Rfl:  nitroGLYCERIN (NITROSTAT) 0.4 MG SL tablet, Place 0.4 mg under the tongue  every 5 (five) minutes as needed. Chest Pain, Disp: , Rfl:  pantoprazole (PROTONIX) 40 MG tablet, Take 1 tablet (40 mg total) by mouth daily at 12 noon., Disp: 40 tablet, Rfl: 2 potassium chloride SA (K-DUR,KLOR-CON) 20 MEQ tablet, Take 1 tablet (20 mEq total) by mouth daily., Disp: 30 tablet, Rfl: 2 simvastatin (ZOCOR) 20 MG tablet, Take 20 mg by mouth at bedtime. , Disp: , Rfl:     Past Medical History:   Coronary atherosclerosis of native coronary ar*                Comment:Occluded RCA (unsuccessful PCI 2003),               nonobstructive left system 2005    Hyperlipidemia                                               Hypertension                                                 Anxiety  Headache                                                     NSTEMI (non-ST elevated myocardial infarction)                 Comment:Managed medically 2005   Diastolic dysfunction                           11/24/2011      Comment:Per echo. Grade 1. Ejection fraction 60-65%.;               Akinesis of the basal inferior myocardium.  Past Surgical History:   CHOLECYSTECTOMY                                 2004         ABDOMINAL HYSTERECTOMY                                       HERNIA REPAIR                                                  Comment:Umbilical hernia repair as a child   COLONOSCOPY                                     08/06/2011       Comment:Procedure: COLONOSCOPY;  Surgeon: West Bali, MD;  Location: [facility];  Service:              Endoscopy;  Laterality: N/A;  10:40 AM  Review of patient's family history indicates:   Coronary artery disease        Father                   Coronary artery disease        Mother                   Diabetes type II               Father                   Diabetes type II               Mother                   Social History   Marital Status: Married             Spouse Name:                       Years of Education:                 Number of children:             Occupational History Occupation  Employer            Comment              Retired                                   Social History Main Topics   Smoking Status: Former Smoker                   Packs/Day: .5    Years: 25        Types: Cigarettes   Smokeless Status: Not on file                      Alcohol Use: No             Drug Use: No             Sexual Activity: Not on file        Other Topics            Concern   None on file  Social History Narrative   No regular exercise    ROS:see history of present illness otherwise negative   PHYSICAL EXAM: Well-nournished, in no acute distress. Neck: No JVD, HJR, Bruit, or thyroid enlargement  Lungs: No tachypnea, clear without wheezing, rales, or rhonchi  Cardiovascular: RRR, PMI not displaced, heart sounds normal, no murmurs, gallops, bruit, thrill, or heave.  Abdomen: BS normal. Soft without organomegaly, masses, lesions or tenderness.  Extremities: without cyanosis, clubbing or edema. Good distal pulses bilateral  SKin: Warm, no lesions or rashes   Musculoskeletal: No deformities  Neuro: no focal signs  BP 140/86  Pulse 62  Ht 5' (1.524 m)  Wt 152 lb (68.947 kg)  BMI 29.69 kg/m2    AVW:UJWJX bradycardia at 59 beats per minute with occasional PVC incomplete right bundle branch block and nonspecific ST-T wave changes  2Decho:11/23/11 Echo:Study Conclusions  - Left ventricle: The cavity size was normal. Wall thickness was increased in a pattern of moderate LVH. Systolic function was normal. The estimated ejection fraction was in the range of 60% to 65%. There is akinesis of the basalinferior myocardium. Doppler parameters are consistent with abnormal left ventricular relaxation (grade 1 diastolic dysfunction). - Aortic valve: Mildly calcified annulus. Trileaflet; mildly thickened leaflets. - Mitral valve: Calcified  annulus. Mildly thickened leaflets . Trivial regurgitation. - Left atrium: The atrium was mildly dilated. - Tricuspid valve: Trivial regurgitation. - Pulmonic valve: Mild regurgitation. - Pericardium, extracardiac: A prominent pericardial fat pad was present. There was no pericardial effusion. Transthoracic echocardiography. M-mode, complete 2D, spectral Doppler, and color Doppler. Height: Height: 157.5cm. Height: 62in. Weight: Weight: 69.9kg. Weight: 153.7lb. Body mass index: BMI: 28.2kg/m^2. Body surface area: BSA: 1.56m^2. Patient status: Inpatient  Stress myoview:11/24/11 IMPRESSION: Negative pharmacologic stress nuclear myocardial study.   Patient's labs on 12/01/11 showed a normal potassium of 4.2

## 2011-12-31 NOTE — Assessment & Plan Note (Signed)
Stable. Patient has mild ankle edema. No dyspnea.

## 2011-12-31 NOTE — Assessment & Plan Note (Signed)
Patient is taking potassium supplement and had blood work about 2 weeks ago. She states that it was normal.

## 2012-02-15 ENCOUNTER — Encounter (HOSPITAL_COMMUNITY): Payer: Self-pay | Admitting: Cardiology

## 2012-03-08 ENCOUNTER — Ambulatory Visit: Payer: Medicare Other | Admitting: Cardiology

## 2012-06-06 ENCOUNTER — Other Ambulatory Visit (HOSPITAL_COMMUNITY): Payer: Self-pay | Admitting: Family Medicine

## 2012-06-06 DIAGNOSIS — Z139 Encounter for screening, unspecified: Secondary | ICD-10-CM

## 2012-07-04 ENCOUNTER — Ambulatory Visit (HOSPITAL_COMMUNITY)
Admission: RE | Admit: 2012-07-04 | Discharge: 2012-07-04 | Disposition: A | Discharge status: Home / Self Care | Payer: Medicare Other | Source: Ambulatory Visit | Attending: Family Medicine | Admitting: Family Medicine

## 2012-07-04 DIAGNOSIS — Z139 Encounter for screening, unspecified: Secondary | ICD-10-CM

## 2012-07-04 DIAGNOSIS — Z1231 Encounter for screening mammogram for malignant neoplasm of breast: Secondary | ICD-10-CM | POA: Insufficient documentation

## 2012-11-22 ENCOUNTER — Encounter: Payer: Self-pay | Admitting: *Deleted

## 2013-06-23 ENCOUNTER — Other Ambulatory Visit (HOSPITAL_COMMUNITY): Payer: Self-pay | Admitting: Family Medicine

## 2013-06-23 DIAGNOSIS — M810 Age-related osteoporosis without current pathological fracture: Secondary | ICD-10-CM

## 2013-06-26 ENCOUNTER — Encounter (HOSPITAL_COMMUNITY): Payer: Self-pay | Admitting: Emergency Medicine

## 2013-06-26 ENCOUNTER — Emergency Department (HOSPITAL_COMMUNITY)
Admission: EM | Admit: 2013-06-26 | Discharge: 2013-06-26 | Disposition: A | Discharge status: Home / Self Care | Payer: Medicare Other | Attending: Emergency Medicine | Admitting: Emergency Medicine

## 2013-06-26 ENCOUNTER — Emergency Department (HOSPITAL_COMMUNITY): Payer: Medicare Other

## 2013-06-26 DIAGNOSIS — F411 Generalized anxiety disorder: Secondary | ICD-10-CM | POA: Insufficient documentation

## 2013-06-26 DIAGNOSIS — E785 Hyperlipidemia, unspecified: Secondary | ICD-10-CM | POA: Insufficient documentation

## 2013-06-26 DIAGNOSIS — Z7982 Long term (current) use of aspirin: Secondary | ICD-10-CM | POA: Insufficient documentation

## 2013-06-26 DIAGNOSIS — I5030 Unspecified diastolic (congestive) heart failure: Secondary | ICD-10-CM | POA: Insufficient documentation

## 2013-06-26 DIAGNOSIS — I10 Essential (primary) hypertension: Secondary | ICD-10-CM | POA: Insufficient documentation

## 2013-06-26 DIAGNOSIS — I2510 Atherosclerotic heart disease of native coronary artery without angina pectoris: Secondary | ICD-10-CM | POA: Insufficient documentation

## 2013-06-26 DIAGNOSIS — Z79899 Other long term (current) drug therapy: Secondary | ICD-10-CM | POA: Insufficient documentation

## 2013-06-26 DIAGNOSIS — Z87891 Personal history of nicotine dependence: Secondary | ICD-10-CM | POA: Insufficient documentation

## 2013-06-26 DIAGNOSIS — I252 Old myocardial infarction: Secondary | ICD-10-CM | POA: Insufficient documentation

## 2013-06-26 LAB — BASIC METABOLIC PANEL
BUN: 11 mg/dL (ref 6–23)
CALCIUM: 9.2 mg/dL (ref 8.4–10.5)
CO2: 29 mEq/L (ref 19–32)
CREATININE: 0.65 mg/dL (ref 0.50–1.10)
Chloride: 99 mEq/L (ref 96–112)
GFR calc Af Amer: >90 mL/min (ref >90)
GFR, EST NON AFRICAN AMERICAN: 82 mL/min — AB (ref >90)
Glucose, Bld: 161 mg/dL — ABNORMAL HIGH (ref 70–99)
Potassium: 3.3 mEq/L — ABNORMAL LOW (ref 3.7–5.3)
SODIUM: 139 meq/L (ref 137–147)

## 2013-06-26 LAB — CBC WITH DIFFERENTIAL/PLATELET
BASOS ABS: 0 10*3/uL (ref 0.0–0.1)
Basophils Relative: 0 % (ref 0–1)
EOS ABS: 0 10*3/uL (ref 0.0–0.7)
EOS PCT: 0 % (ref 0–5)
HCT: 38.1 % (ref 36.0–46.0)
Hemoglobin: 13.8 g/dL (ref 12.0–15.0)
Lymphocytes Relative: 38 % (ref 12–46)
Lymphs Abs: 1.7 10*3/uL (ref 0.7–4.0)
MCH: 33.7 pg (ref 26.0–34.0)
MCHC: 36.2 g/dL — AB (ref 30.0–36.0)
MCV: 92.9 fL (ref 78.0–100.0)
MONO ABS: 0.5 10*3/uL (ref 0.1–1.0)
Monocytes Relative: 10 % (ref 3–12)
Neutro Abs: 2.4 10*3/uL (ref 1.7–7.7)
Neutrophils Relative %: 52 % (ref 43–77)
PLATELETS: 226 10*3/uL (ref 150–400)
RBC: 4.1 MIL/uL (ref 3.87–5.11)
RDW: 12.2 % (ref 11.5–15.5)
WBC: 4.6 10*3/uL (ref 4.0–10.5)

## 2013-06-26 LAB — TROPONIN I

## 2013-06-26 NOTE — ED Notes (Signed)
Pt seen at [facility] on Friday for knee pain and had HTN at visit. Pt called MD this am d/t HTN with home reading and was told to come to ER to be seen. PT denies any symptoms at this time but had a headache last night.

## 2013-06-26 NOTE — ED Notes (Signed)
At Dr.Cooks direction, pt took 0.2mg  of her own clonidine PO

## 2013-06-26 NOTE — ED Provider Notes (Signed)
CSN: [id number]     Arrival date & time 06/26/13  1350 History   First MD Initiated Contact with Patient 06/26/13 1842     Chief Complaint  Patient presents with  . Hypertension     (Consider location/radiation/quality/duration/timing/severity/associated sxs/prior Treatment) HPI..... patient is concerned about her blood pressure. She had a primary care visit on Friday and her systolic was greater than 200.  No complaints of chest pain, shortness breath, neurological deficits. She is presently taking atenolol and clonidine for blood pressure. Severity is mild to moderate. No other somatic complaints  Past Medical History  Diagnosis Date  . Coronary atherosclerosis of native coronary artery     Occluded RCA (unsuccessful PCI 2003), nonobstructive left system 2005   . Hyperlipidemia   . Hypertension   . Anxiety   . Headache(784.0)   . NSTEMI (non-ST elevated myocardial infarction)     Managed medically 2005  . Diastolic dysfunction 1/63/8466    Per echo. Grade 1. Ejection fraction 60-65%.; Akinesis of the basal inferior myocardium.   Past Surgical History  Procedure Laterality Date  . Cholecystectomy  2004  . Abdominal hysterectomy    . Hernia repair      Umbilical hernia repair as a child  . Colonoscopy  08/06/2011    Procedure: COLONOSCOPY;  Surgeon: [name], MD;  Location: [facility];  Service: Endoscopy;  Laterality: N/A;  10:40 AM   Family History  Problem Relation Age of Onset  . Coronary artery disease Father   . Coronary artery disease Mother   . Diabetes type II Father   . Diabetes type II Mother    History  Substance Use Topics  . Smoking status: Former Smoker -- 0.50 packs/day for 25 years    Types: Cigarettes  . Smokeless tobacco: Not on file  . Alcohol Use: No   OB History   Grav Para Term Preterm Abortions TAB SAB Ect Mult Living                 Review of Systems  All other systems reviewed and are negative.     Allergies  Tylenol  Home  Medications   Prior to Admission medications   Medication Sig Start Date End Date Taking? Authorizing Provider  alendronate (FOSAMAX) 70 MG tablet Take 70 mg by mouth once a week. Takes on Saturdays/Sundays   Yes Historical Provider, MD  aspirin EC 81 MG tablet Take 81 mg by mouth every morning.   Yes Historical Provider, MD  atenolol (TENORMIN) 100 MG tablet Take 100 mg by mouth daily.  12/24/10  Yes Historical Provider, MD  cloNIDine (CATAPRES) 0.1 MG tablet Take 0.1-0.2 mg by mouth 2 (two) times daily. Takes one tablet in the morning and two in the afternoon   Yes Historical Provider, MD  dicyclomine (BENTYL) 10 MG capsule Take 10 mg by mouth daily as needed for spasms.   Yes Historical Provider, MD  diphenhydrAMINE (BENADRYL) 25 mg capsule Take 25 mg by mouth every 6 (six) hours as needed for allergies.   Yes Historical Provider, MD  HYDROcodone-acetaminophen (NORCO/VICODIN) 5-325 MG per tablet Take 1 tablet by mouth every 6 (six) hours as needed for moderate pain.   Yes Historical Provider, MD  LORazepam (ATIVAN) 1 MG tablet Take 1 mg by mouth every 8 (eight) hours as needed for anxiety or sleep. Anxiety   Yes Historical Provider, MD  nitroGLYCERIN (NITROSTAT) 0.4 MG SL tablet Place 0.4 mg under the tongue every 5 (five) minutes as  needed. Chest Pain   Yes Historical Provider, MD  simvastatin (ZOCOR) 20 MG tablet Take 20 mg by mouth every morning.  01/02/11  Yes Historical Provider, MD  spironolactone (ALDACTONE) 25 MG tablet Take 25 mg by mouth daily. 06/26/13  Yes Historical Provider, MD   BP 170/77  Pulse 50  Temp(Src) 98 F (36.7 C) (Oral)  Resp 18  Ht 5' (1.524 m)  Wt 142 lb (64.411 kg)  BMI 27.73 kg/m2  SpO2 95% Physical Exam  Nursing note and vitals reviewed. Constitutional: She is oriented to person, place, and time. She appears well-developed and well-nourished.  Hypertensive  HENT:  Head: Normocephalic and atraumatic.  Eyes: Conjunctivae and EOM are normal. Pupils are  equal, round, and reactive to light.  Neck: Normal range of motion. Neck supple.  Cardiovascular: Normal rate, regular rhythm and normal heart sounds.   Pulmonary/Chest: Effort normal and breath sounds normal.  Abdominal: Soft. Bowel sounds are normal.  Musculoskeletal: Normal range of motion.  Neurological: She is alert and oriented to person, place, and time.  Skin: Skin is warm and dry.  Psychiatric: She has a normal mood and affect. Her behavior is normal.    ED Course  Procedures (including critical care time) Labs Review Labs Reviewed  CBC WITH DIFFERENTIAL - Abnormal; Notable for the following:    MCHC 36.2 (*)    All other components within normal limits  BASIC METABOLIC PANEL - Abnormal; Notable for the following:    Potassium 3.3 (*)    Glucose, Bld 161 (*)    GFR calc non Af Amer 82 (*)    All other components within normal limits  TROPONIN I    Imaging Review Dg Chest 2 View  06/26/2013   CLINICAL DATA:  Hypertension  EXAM: CHEST  2 VIEW  COMPARISON:  None.  FINDINGS: There is no focal parenchymal opacity, pleural effusion, or pneumothorax. The heart and mediastinal contours are unremarkable. There is thoracic aortic atherosclerosis.  There is mild mid thoracic spine spondylosis.  IMPRESSION: No active cardiopulmonary disease.   Electronically Signed   By: Kathreen Devoid   On: 06/26/2013 14:28     EKG Interpretation None      MDM   Final diagnoses:  Hypertension    Patient's evening dose of clonidine 0.2 mg administered. Pressure reduced to 170/77.   Patient has no other complaints. We'll discharge home with her new blood pressure recommendations as follows: Morning dose atenolol 100 mg and clonidine 0.1 mg and evening dose clonidine 0.2 mg    Nat Christen, MD 06/26/13 2046

## 2013-06-26 NOTE — ED Notes (Signed)
Alert, no c/o. Ready to go home.

## 2013-06-26 NOTE — Discharge Instructions (Signed)
For your blood pressure, take atenolol 100 mg and clonidine 0.1 mg in the morning and clonidine 0.1 mg 2 tablets in the evening.  Followup your primary care Dr.

## 2013-06-28 ENCOUNTER — Ambulatory Visit (HOSPITAL_COMMUNITY)
Admission: RE | Admit: 2013-06-28 | Discharge: 2013-06-28 | Disposition: A | Discharge status: Home / Self Care | Payer: Medicare Other | Source: Ambulatory Visit | Attending: Family Medicine | Admitting: Family Medicine

## 2013-06-28 DIAGNOSIS — M899 Disorder of bone, unspecified: Secondary | ICD-10-CM | POA: Insufficient documentation

## 2013-06-28 DIAGNOSIS — M810 Age-related osteoporosis without current pathological fracture: Secondary | ICD-10-CM

## 2013-06-28 DIAGNOSIS — M949 Disorder of cartilage, unspecified: Principal | ICD-10-CM

## 2013-06-28 DIAGNOSIS — Z780 Asymptomatic menopausal state: Secondary | ICD-10-CM | POA: Insufficient documentation

## 2013-10-28 ENCOUNTER — Emergency Department (HOSPITAL_COMMUNITY): Payer: Medicare Other

## 2013-10-28 ENCOUNTER — Encounter (HOSPITAL_COMMUNITY): Payer: Self-pay | Admitting: Emergency Medicine

## 2013-10-28 ENCOUNTER — Observation Stay (HOSPITAL_COMMUNITY)
Admission: EM | Admit: 2013-10-28 | Discharge: 2013-10-30 | Disposition: A | Discharge status: Home / Self Care | Payer: Medicare Other | Attending: Internal Medicine | Admitting: Internal Medicine

## 2013-10-28 DIAGNOSIS — I252 Old myocardial infarction: Secondary | ICD-10-CM | POA: Diagnosis not present

## 2013-10-28 DIAGNOSIS — Z9089 Acquired absence of other organs: Secondary | ICD-10-CM | POA: Diagnosis not present

## 2013-10-28 DIAGNOSIS — R079 Chest pain, unspecified: Principal | ICD-10-CM | POA: Diagnosis present

## 2013-10-28 DIAGNOSIS — F411 Generalized anxiety disorder: Secondary | ICD-10-CM | POA: Insufficient documentation

## 2013-10-28 DIAGNOSIS — Z886 Allergy status to analgesic agent status: Secondary | ICD-10-CM | POA: Insufficient documentation

## 2013-10-28 DIAGNOSIS — I10 Essential (primary) hypertension: Secondary | ICD-10-CM | POA: Diagnosis not present

## 2013-10-28 DIAGNOSIS — Z90710 Acquired absence of both cervix and uterus: Secondary | ICD-10-CM | POA: Insufficient documentation

## 2013-10-28 DIAGNOSIS — Z7982 Long term (current) use of aspirin: Secondary | ICD-10-CM | POA: Diagnosis not present

## 2013-10-28 DIAGNOSIS — Z79899 Other long term (current) drug therapy: Secondary | ICD-10-CM | POA: Insufficient documentation

## 2013-10-28 DIAGNOSIS — R739 Hyperglycemia, unspecified: Secondary | ICD-10-CM | POA: Diagnosis present

## 2013-10-28 DIAGNOSIS — E785 Hyperlipidemia, unspecified: Secondary | ICD-10-CM | POA: Insufficient documentation

## 2013-10-28 DIAGNOSIS — R0789 Other chest pain: Secondary | ICD-10-CM

## 2013-10-28 DIAGNOSIS — Z803 Family history of malignant neoplasm of breast: Secondary | ICD-10-CM | POA: Diagnosis not present

## 2013-10-28 DIAGNOSIS — R7309 Other abnormal glucose: Secondary | ICD-10-CM | POA: Diagnosis not present

## 2013-10-28 DIAGNOSIS — I2510 Atherosclerotic heart disease of native coronary artery without angina pectoris: Secondary | ICD-10-CM | POA: Diagnosis present

## 2013-10-28 DIAGNOSIS — E782 Mixed hyperlipidemia: Secondary | ICD-10-CM | POA: Diagnosis present

## 2013-10-28 DIAGNOSIS — Z9861 Coronary angioplasty status: Secondary | ICD-10-CM

## 2013-10-28 DIAGNOSIS — I5189 Other ill-defined heart diseases: Secondary | ICD-10-CM

## 2013-10-28 LAB — BASIC METABOLIC PANEL
Anion gap: 11 (ref 5–15)
BUN: 21 mg/dL (ref 6–23)
CALCIUM: 9.7 mg/dL (ref 8.4–10.5)
CO2: 28 mEq/L (ref 19–32)
Chloride: 100 mEq/L (ref 96–112)
Creatinine, Ser: 1.11 mg/dL — ABNORMAL HIGH (ref 0.50–1.10)
GFR calc Af Amer: 53 mL/min — ABNORMAL LOW (ref >90)
GFR, EST NON AFRICAN AMERICAN: 46 mL/min — AB (ref >90)
Glucose, Bld: 191 mg/dL — ABNORMAL HIGH (ref 70–99)
POTASSIUM: 4 meq/L (ref 3.7–5.3)
Sodium: 139 mEq/L (ref 137–147)

## 2013-10-28 LAB — HEPATIC FUNCTION PANEL
ALBUMIN: 3.5 g/dL (ref 3.5–5.2)
ALT: 16 U/L (ref 0–35)
AST: 18 U/L (ref 0–37)
Alkaline Phosphatase: 42 U/L (ref 39–117)
Bilirubin, Direct: <0.2 mg/dL (ref 0.0–0.3)
Total Bilirubin: 0.4 mg/dL (ref 0.3–1.2)
Total Protein: 6.5 g/dL (ref 6.0–8.3)

## 2013-10-28 LAB — CBC
HCT: 33.8 % — ABNORMAL LOW (ref 36.0–46.0)
Hemoglobin: 12.1 g/dL (ref 12.0–15.0)
MCH: 34.2 pg — ABNORMAL HIGH (ref 26.0–34.0)
MCHC: 35.8 g/dL (ref 30.0–36.0)
MCV: 95.5 fL (ref 78.0–100.0)
PLATELETS: 198 10*3/uL (ref 150–400)
RBC: 3.54 MIL/uL — ABNORMAL LOW (ref 3.87–5.11)
RDW: 12.1 % (ref 11.5–15.5)
WBC: 4 10*3/uL (ref 4.0–10.5)

## 2013-10-28 LAB — TROPONIN I

## 2013-10-28 NOTE — ED Provider Notes (Signed)
CSN: [id number]     Arrival date & time 10/28/13  2023 History   This chart was scribed for Maudry Diego, MD by Jeanell Sparrow, ED Scribe. This patient was seen in room APA05/APA05 and the patient's care was started at 9:04 PM.   Chief Complaint  Patient presents with  . Chest Pain   Patient is a 80 y.o. female presenting with chest pain. The history is provided by the patient. No language interpreter was used.  Chest Pain Pain location:  L chest Pain radiates to:  Does not radiate Pain radiates to the back: no   Pain severity:  Moderate Onset quality:  Sudden Timing:  Constant Progression:  Partially resolved Chronicity:  Recurrent Relieved by:  Nitroglycerin Worsened by:  Nothing tried Ineffective treatments:  None tried (Xanax) Associated symptoms: no abdominal pain, no back pain, no cough, no diaphoresis, no fatigue, no headache and no shortness of breath    HPI Comments: Cindy Brandt is a 80 y.o. female who presents to the Emergency Department complaining of constant moderate left sided chest pain that started today. She reports that the pain started after coming home from a family reunion. She states that she took some Xanax and then some nitroglycerin afterwards. She reports that the nitroglycerin may have provided some relief. She reports that the pain radiates to her left arm. She states that the pain feels similar to a past MI. She reports that she currently lives by herself after her husband passed away about 4 months ago. She denies any diaphoresis or SOB.   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 Coronary atherosclerosis of native coronary artery     Occluded RCA (unsuccessful PCI 2003), nonobstructive left system 2005   . Hyperlipidemia   . Hypertension   . Anxiety   . Headache(784.0)   . NSTEMI (non-ST elevated myocardial infarction)     Managed medically 2005  . Diastolic dysfunction 8/41/3244    Per echo.  Grade 1. Ejection fraction 60-65%.; Akinesis of the basal inferior myocardium.  Marland Kitchen Anxiety    Past Surgical History  Procedure Laterality Date  . Cholecystectomy  2004  . Abdominal hysterectomy    . Hernia repair      Umbilical hernia repair as a child  . Colonoscopy  08/06/2011    Procedure: COLONOSCOPY;  Surgeon: [name], MD;  Location: [facility];  Service: Endoscopy;  Laterality: N/A;  10:40 AM   Family History  Problem Relation Age of Onset  . Coronary artery disease Father   . Coronary artery disease Mother   . Diabetes type II Father   . Diabetes type II Mother    History  Substance Use Topics  . Smoking status: Former Smoker -- 0.50 packs/day for 25 years    Types: Cigarettes  . Smokeless tobacco: Not on file  . Alcohol Use: No   OB History   Grav Para Term Preterm Abortions TAB SAB Ect Mult Living                 Review of Systems  Constitutional: Negative for diaphoresis, appetite change and fatigue.  HENT: Negative for congestion, ear discharge and sinus pressure.   Eyes: Negative for discharge.  Respiratory: Negative for cough and shortness of breath.   Cardiovascular: Positive for chest pain.  Gastrointestinal: Negative for abdominal pain and diarrhea.  Genitourinary: Negative for frequency and hematuria.  Musculoskeletal: Negative for back pain.  Skin:  Negative for rash.  Neurological: Negative for seizures and headaches.  Psychiatric/Behavioral: Negative for hallucinations.    Allergies  Tylenol  Home Medications   Prior to Admission medications   Medication Sig Start Date End Date Taking? Authorizing Provider  alendronate (FOSAMAX) 70 MG tablet Take 70 mg by mouth once a week. Takes on Saturdays/Sundays   Yes Historical Provider, MD  amLODipine (NORVASC) 10 MG tablet Take 10 mg by mouth daily.   Yes Historical Provider, MD  aspirin EC 81 MG tablet Take 81 mg by mouth every morning.   Yes Historical Provider, MD  atenolol (TENORMIN) 100 MG  tablet Take 100 mg by mouth daily.  12/24/10  Yes Historical Provider, MD  cloNIDine (CATAPRES) 0.2 MG tablet Take 0.2 mg by mouth 2 (two) times daily.   Yes Historical Provider, MD  dicyclomine (BENTYL) 10 MG capsule Take 10 mg by mouth daily as needed for spasms.   Yes Historical Provider, MD  HYDROcodone-acetaminophen (NORCO/VICODIN) 5-325 MG per tablet Take 1 tablet by mouth every 4 (four) hours as needed (pain).    Yes Historical Provider, MD  LORazepam (ATIVAN) 1 MG tablet Take 1 mg by mouth every 8 (eight) hours as needed for anxiety or sleep. Anxiety   Yes Historical Provider, MD  nitroGLYCERIN (NITROSTAT) 0.4 MG SL tablet Place 0.4 mg under the tongue every 5 (five) minutes as needed. Chest Pain   Yes Historical Provider, MD  simvastatin (ZOCOR) 20 MG tablet Take 20 mg by mouth every morning.  01/02/11  Yes Historical Provider, MD  spironolactone (ALDACTONE) 25 MG tablet Take 25 mg by mouth daily. 06/26/13  Yes Historical Provider, MD  diphenhydrAMINE (BENADRYL) 25 mg capsule Take 25 mg by mouth every 6 (six) hours as needed for allergies.    Historical Provider, MD   BP 120/69  Pulse 98  Temp(Src) 98 F (36.7 C) (Oral)  Resp 19  Ht 5\' 2"  (1.575 m)  Wt 138 lb (62.596 kg)  BMI 25.23 kg/m2  SpO2 98% Physical Exam  Nursing note and vitals reviewed. Constitutional: She is oriented to person, place, and time. She appears well-developed.  HENT:  Head: Normocephalic.  Eyes: Conjunctivae and EOM are normal. No scleral icterus.  Neck: Neck supple. No thyromegaly present.  Cardiovascular: Normal rate and regular rhythm.  Exam reveals no gallop and no friction rub.   No murmur heard. Pulmonary/Chest: No stridor. She has no wheezes. She has no rales. She exhibits no tenderness.  Abdominal: She exhibits no distension. There is no tenderness. There is no rebound.  Musculoskeletal: Normal range of motion. She exhibits no edema.  Lymphadenopathy:    She has no cervical adenopathy.   Neurological: She is oriented to person, place, and time. She exhibits normal muscle tone. Coordination normal.  Skin: No rash noted. No erythema.  Psychiatric: She has a normal mood and affect. Her behavior is normal.    ED Course  Procedures (including critical care time) DIAGNOSTIC STUDIES: Oxygen Saturation is 98% on RA, normal by my interpretation.    COORDINATION OF CARE: 9:08 PM- Pt advised of plan for treatment which includes radiology and labs and pt agrees.  Labs Review Labs Reviewed  CBC - Abnormal; Notable for the following:    RBC 3.54 (*)    HCT 33.8 (*)    MCH 34.2 (*)    All other components within normal limits  BASIC METABOLIC PANEL - Abnormal; Notable for the following:    Glucose, Bld 191 (*)    Creatinine,  Ser 1.11 (*)    GFR calc non Af Amer 46 (*)    GFR calc Af Amer 53 (*)    All other components within normal limits  HEPATIC FUNCTION PANEL  TROPONIN I    Imaging Review Dg Chest Portable 1 View  10/28/2013   CLINICAL DATA:  Chest pain  EXAM: PORTABLE CHEST - 1 VIEW  COMPARISON:  06/26/2013  FINDINGS: Borderline heart size with normal pulmonary vascularity. Calcified and tortuous aorta. No focal airspace disease or consolidation in the lungs. No blunting of costophrenic angles. No pneumothorax. Incidental note of calcification superior to the left humeral head suggesting calcific tendinosis. No change since previous study.  IMPRESSION: No active disease.   Electronically Signed   By: Lucienne Capers M.D.   On: 10/28/2013 21:26     EKG Interpretation   Date/Time:  Saturday October 28 2013 20:37:35 EDT Ventricular Rate:  69 PR Interval:  201 QRS Duration: 148 QT Interval:  421 QTC Calculation: 451 R Axis:   10 Text Interpretation:  Sinus rhythm Right bundle branch block Confirmed by  Parthiv Mucci  MD, Mercedies Ganesh [phone]) on 10/28/2013 9:02:55 PM      MDM   Final diagnoses:  None    The chart was scribed for me under my direct supervision.  I  personally performed the history, physical, and medical decision making and all procedures in the evaluation of this patient.Maudry Diego, MD 10/28/13 [id number]

## 2013-10-28 NOTE — ED Notes (Signed)
Pt states she hasn't felt well all week. Pt states today she has felt tired and went to a reunion and came home and began having left sided chest pain that radiates down left arm. Pt took a 1mg  xanax thinking that this would calm her nerves and help her rest. Pt then took a 0.4 mg nitro tablet. Pt unsure if nitro or prayer gave her some relief. Pt states her chest pain feels like it did when she has had a previous heart attack. Pt upset as well because she just lost her husband in May.

## 2013-10-28 NOTE — ED Notes (Signed)
Family at bedside. Patient placed in gown and on cardiac monitor.

## 2013-10-28 NOTE — ED Notes (Signed)
Pt. Reports chest pain starting earlier tonight. Pt. Reports she was sitting when pain suddenly started. Pt. Denies shortness of breath, nausea, or vomiting. Pt. Reports taking 1 nitro at home. Pt. Denies pain at present. Pt. States pain earlier felt similar to pain during 2 previous MI.

## 2013-10-29 ENCOUNTER — Encounter (HOSPITAL_COMMUNITY): Payer: Self-pay

## 2013-10-29 DIAGNOSIS — R079 Chest pain, unspecified: Secondary | ICD-10-CM

## 2013-10-29 LAB — COMPREHENSIVE METABOLIC PANEL
ALBUMIN: 3.4 g/dL — AB (ref 3.5–5.2)
ALK PHOS: 40 U/L (ref 39–117)
ALT: 16 U/L (ref 0–35)
AST: 17 U/L (ref 0–37)
Anion gap: 10 (ref 5–15)
BILIRUBIN TOTAL: 0.7 mg/dL (ref 0.3–1.2)
BUN: 17 mg/dL (ref 6–23)
CHLORIDE: 101 meq/L (ref 96–112)
CO2: 28 mEq/L (ref 19–32)
Calcium: 9.7 mg/dL (ref 8.4–10.5)
Creatinine, Ser: 0.88 mg/dL (ref 0.50–1.10)
GFR calc Af Amer: 70 mL/min — ABNORMAL LOW (ref >90)
GFR calc non Af Amer: 60 mL/min — ABNORMAL LOW (ref >90)
Glucose, Bld: 173 mg/dL — ABNORMAL HIGH (ref 70–99)
POTASSIUM: 4.2 meq/L (ref 3.7–5.3)
Sodium: 139 mEq/L (ref 137–147)
Total Protein: 6.6 g/dL (ref 6.0–8.3)

## 2013-10-29 LAB — TROPONIN I: Troponin I: <0.3 ng/mL (ref <0.30)

## 2013-10-29 LAB — CBC
HCT: 33.1 % — ABNORMAL LOW (ref 36.0–46.0)
HCT: 34.3 % — ABNORMAL LOW (ref 36.0–46.0)
Hemoglobin: 11.9 g/dL — ABNORMAL LOW (ref 12.0–15.0)
Hemoglobin: 11.9 g/dL — ABNORMAL LOW (ref 12.0–15.0)
MCH: 34.5 pg — ABNORMAL HIGH (ref 26.0–34.0)
MCH: 33.3 pg (ref 26.0–34.0)
MCHC: 36 g/dL (ref 30.0–36.0)
MCHC: 34.7 g/dL (ref 30.0–36.0)
MCV: 95.9 fL (ref 78.0–100.0)
MCV: 96.1 fL (ref 78.0–100.0)
PLATELETS: 203 10*3/uL (ref 150–400)
Platelets: 182 10*3/uL (ref 150–400)
RBC: 3.45 MIL/uL — AB (ref 3.87–5.11)
RBC: 3.57 MIL/uL — ABNORMAL LOW (ref 3.87–5.11)
RDW: 12.1 % (ref 11.5–15.5)
RDW: 12.1 % (ref 11.5–15.5)
WBC: 4.2 10*3/uL (ref 4.0–10.5)
WBC: 3.7 10*3/uL — AB (ref 4.0–10.5)

## 2013-10-29 LAB — LIPID PANEL
CHOL/HDL RATIO: 2.7 ratio
CHOLESTEROL: 134 mg/dL (ref 0–200)
HDL: 49 mg/dL (ref >39)
LDL Cholesterol: 71 mg/dL (ref 0–99)
Triglycerides: 70 mg/dL (ref <150)
VLDL: 14 mg/dL (ref 0–40)

## 2013-10-29 LAB — MRSA PCR SCREENING: MRSA by PCR: NEGATIVE

## 2013-10-29 LAB — CREATININE, SERUM
CREATININE: 0.92 mg/dL (ref 0.50–1.10)
GFR, EST AFRICAN AMERICAN: 66 mL/min — AB (ref >90)
GFR, EST NON AFRICAN AMERICAN: 57 mL/min — AB (ref >90)

## 2013-10-29 MED ORDER — ENOXAPARIN SODIUM 40 MG/0.4ML ~~LOC~~ SOLN
40.0000 mg | SUBCUTANEOUS | Status: DC
  Administered 2013-10-29 – 2013-10-30 (×2): 40 mg via SUBCUTANEOUS
  Filled 2013-10-29 (×3): qty 0.4

## 2013-10-29 MED ORDER — CLONIDINE HCL 0.2 MG PO TABS
0.2000 mg | ORAL_TABLET | Freq: Two times a day (BID) | ORAL | Status: DC
  Administered 2013-10-29 – 2013-10-30 (×4): 0.2 mg via ORAL
  Filled 2013-10-29 (×4): qty 1

## 2013-10-29 MED ORDER — SODIUM CHLORIDE 0.9 % IV SOLN: 250.0000 mL | INTRAVENOUS | Status: DC | PRN

## 2013-10-29 MED ORDER — ONDANSETRON HCL 4 MG/2ML IJ SOLN: 4.0000 mg | Freq: Four times a day (QID) | INTRAMUSCULAR | Status: DC | PRN

## 2013-10-29 MED ORDER — SPIRONOLACTONE 25 MG PO TABS
25.0000 mg | ORAL_TABLET | Freq: Every day | ORAL | Status: DC
  Administered 2013-10-29 – 2013-10-30 (×2): 25 mg via ORAL
  Filled 2013-10-29 (×2): qty 1

## 2013-10-29 MED ORDER — ASPIRIN EC 81 MG PO TBEC
81.0000 mg | DELAYED_RELEASE_TABLET | Freq: Every morning | ORAL | Status: DC
  Administered 2013-10-29 – 2013-10-30 (×2): 81 mg via ORAL
  Filled 2013-10-29 (×2): qty 1

## 2013-10-29 MED ORDER — SODIUM CHLORIDE 0.9 % IJ SOLN
3.0000 mL | Freq: Two times a day (BID) | INTRAMUSCULAR | Status: DC
  Administered 2013-10-29 – 2013-10-30 (×3): 3 mL via INTRAVENOUS

## 2013-10-29 MED ORDER — METOPROLOL TARTRATE 50 MG PO TABS
50.0000 mg | ORAL_TABLET | Freq: Two times a day (BID) | ORAL | Status: DC
  Administered 2013-10-29 – 2013-10-30 (×3): 50 mg via ORAL
  Filled 2013-10-29 (×3): qty 1

## 2013-10-29 MED ORDER — NITROGLYCERIN 0.4 MG SL SUBL: 0.4000 mg | SUBLINGUAL_TABLET | SUBLINGUAL | Status: DC | PRN

## 2013-10-29 MED ORDER — AMLODIPINE BESYLATE 5 MG PO TABS
10.0000 mg | ORAL_TABLET | Freq: Every day | ORAL | Status: DC
  Administered 2013-10-29 – 2013-10-30 (×2): 10 mg via ORAL
  Filled 2013-10-29 (×2): qty 2

## 2013-10-29 MED ORDER — LORAZEPAM 0.5 MG PO TABS
1.0000 mg | ORAL_TABLET | Freq: Three times a day (TID) | ORAL | Status: DC | PRN
  Administered 2013-10-29 (×2): 1 mg via ORAL
  Filled 2013-10-29 (×2): qty 2

## 2013-10-29 MED ORDER — HYDROCODONE-ACETAMINOPHEN 5-325 MG PO TABS
1.0000 | ORAL_TABLET | ORAL | Status: DC | PRN
  Administered 2013-10-30: 1 via ORAL
  Filled 2013-10-29: qty 1

## 2013-10-29 MED ORDER — SIMVASTATIN 20 MG PO TABS
20.0000 mg | ORAL_TABLET | Freq: Every morning | ORAL | Status: DC
  Administered 2013-10-29 – 2013-10-30 (×2): 20 mg via ORAL
  Filled 2013-10-29 (×2): qty 1

## 2013-10-29 MED ORDER — DICYCLOMINE HCL 10 MG PO CAPS
10.0000 mg | ORAL_CAPSULE | Freq: Every day | ORAL | Status: DC | PRN
  Filled 2013-10-29: qty 1

## 2013-10-29 MED ORDER — DIPHENHYDRAMINE HCL 25 MG PO CAPS: 25.0000 mg | ORAL_CAPSULE | Freq: Four times a day (QID) | ORAL | Status: DC | PRN

## 2013-10-29 MED ORDER — ATENOLOL 25 MG PO TABS: 100.0000 mg | ORAL_TABLET | Freq: Every day | ORAL | Status: DC

## 2013-10-29 MED ORDER — SODIUM CHLORIDE 0.9 % IJ SOLN
3.0000 mL | Freq: Two times a day (BID) | INTRAMUSCULAR | Status: DC
  Administered 2013-10-29: 3 mL via INTRAVENOUS

## 2013-10-29 MED ORDER — SODIUM CHLORIDE 0.9 % IJ SOLN: 3.0000 mL | INTRAMUSCULAR | Status: DC | PRN

## 2013-10-29 MED ORDER — ONDANSETRON HCL 4 MG PO TABS: 4.0000 mg | ORAL_TABLET | Freq: Four times a day (QID) | ORAL | Status: DC | PRN

## 2013-10-29 NOTE — H&P (Signed)
PCP:   Leonides Grills, MD   Chief Complaint:  Chest pain  HPI: 80 year old female who   has a past medical history of Coronary atherosclerosis of native coronary artery; Hyperlipidemia; Hypertension; Anxiety; Headache(784.0); NSTEMI (non-ST elevated myocardial infarction); Diastolic dysfunction (0/35/4656); and Anxiety. Presents  to the ED with a one-day history of chest pain, the pain started at night after patient came home from a family reunion. The pain was left-sided moderate with radiation to left arm. Pain lasted for about an hour, when patient took nitroglycerin she got some relief. The pain was 10/10 in intensity, she denies any shortness of breath. No nausea vomiting or diarrhea. Patient lives by herself after her husband passed away 4 months ago. Currently patient is chest pain-free, cardiac enzymes so far are negative. Patient has a history of CAD, hypertension, hyperlipidemia. EKG shows normal sinus rhythm with right bundle branch block.  Allergies:   Allergies  Allergen Reactions  . Tylenol [Acetaminophen] Other (See Comments)    Funny feeling in chest.       Past Medical History  Diagnosis Date  . Coronary atherosclerosis of native coronary artery     Occluded RCA (unsuccessful PCI 2003), nonobstructive left system 2005   . Hyperlipidemia   . Hypertension   . Anxiety   . Headache(784.0)   . NSTEMI (non-ST elevated myocardial infarction)     Managed medically 2005  . Diastolic dysfunction 10/12/7515    Per echo. Grade 1. Ejection fraction 60-65%.; Akinesis of the basal inferior myocardium.  Marland Kitchen Anxiety     Past Surgical History  Procedure Laterality Date  . Cholecystectomy  2004  . Abdominal hysterectomy    . Hernia repair      Umbilical hernia repair as a child  . Colonoscopy  08/06/2011    Procedure: COLONOSCOPY;  Surgeon: [name], MD;  Location: [facility];  Service: Endoscopy;  Laterality: N/A;  10:40 AM    Prior to Admission medications     Medication Sig Start Date End Date Taking? Authorizing Provider  alendronate (FOSAMAX) 70 MG tablet Take 70 mg by mouth once a week. Takes on Saturdays/Sundays   Yes Historical Provider, MD  amLODipine (NORVASC) 10 MG tablet Take 10 mg by mouth daily.   Yes Historical Provider, MD  aspirin EC 81 MG tablet Take 81 mg by mouth every morning.   Yes Historical Provider, MD  atenolol (TENORMIN) 100 MG tablet Take 100 mg by mouth daily.  12/24/10  Yes Historical Provider, MD  cloNIDine (CATAPRES) 0.2 MG tablet Take 0.2 mg by mouth 2 (two) times daily.   Yes Historical Provider, MD  dicyclomine (BENTYL) 10 MG capsule Take 10 mg by mouth daily as needed for spasms.   Yes Historical Provider, MD  HYDROcodone-acetaminophen (NORCO/VICODIN) 5-325 MG per tablet Take 1 tablet by mouth every 4 (four) hours as needed (pain).    Yes Historical Provider, MD  LORazepam (ATIVAN) 1 MG tablet Take 1 mg by mouth every 8 (eight) hours as needed for anxiety or sleep. Anxiety   Yes Historical Provider, MD  nitroGLYCERIN (NITROSTAT) 0.4 MG SL tablet Place 0.4 mg under the tongue every 5 (five) minutes as needed. Chest Pain   Yes Historical Provider, MD  simvastatin (ZOCOR) 20 MG tablet Take 20 mg by mouth every morning.  01/02/11  Yes Historical Provider, MD  spironolactone (ALDACTONE) 25 MG tablet Take 25 mg by mouth daily. 06/26/13  Yes Historical Provider, MD  diphenhydrAMINE (BENADRYL) 25 mg capsule Take 25  mg by mouth every 6 (six) hours as needed for allergies.    Historical Provider, MD    Social History:  reports that she has quit smoking. Her smoking use included Cigarettes. She has a 12.5 pack-year smoking history. She does not have any smokeless tobacco history on file. She reports that she does not drink alcohol or use illicit drugs.  Family History  Problem Relation Age of Onset  . Coronary artery disease Father   . Coronary artery disease Mother   . Diabetes type II Father   . Diabetes type II Mother       All the positives are listed in BOLD  Review of Systems:  HEENT: Headache, blurred vision, runny nose, sore throat Neck: Hypothyroidism, hyperthyroidism,,lymphadenopathy Chest : Shortness of breath, history of COPD, Asthma Heart : Chest pain, history of coronary arterey disease GI:  Nausea, vomiting, diarrhea, constipation, GERD GU: Dysuria, urgency, frequency of urination, hematuria Neuro: Stroke, seizures, syncope Psych: Depression, anxiety, hallucinations   Physical Exam: Blood pressure 161/79, pulse 75, temperature 98.3 F (36.8 C), temperature source Oral, resp. rate 14, height 5\' 2"  (1.575 m), weight 63 kg (138 lb 14.2 oz), SpO2 92.00%. Constitutional:   Patient is a well-developed and well-nourished female* in no acute distress and cooperative with exam. Head: Normocephalic and atraumatic Mouth: Mucus membranes moist Eyes: PERRL, EOMI, conjunctivae normal Neck: Supple, No Thyromegaly Cardiovascular: RRR, S1 normal, S2 normal Pulmonary/Chest: CTAB, no wheezes, rales, or rhonchi Abdominal: Soft. Non-tender, non-distended, bowel sounds are normal, no masses, organomegaly, or guarding present.  Neurological: A&O x3, Strenght is normal and symmetric bilaterally, cranial nerve II-XII are grossly intact, no focal motor deficit, sensory intact to light touch bilaterally.  Extremities : No Cyanosis, Clubbing or Edema  Labs on Admission:  Basic Metabolic Panel:  Recent Labs Lab 10/28/13 2100  NA 139  K 4.0  CL 100  CO2 28  GLUCOSE 191*  BUN 21  CREATININE 1.11*  CALCIUM 9.7   Liver Function Tests:  Recent Labs Lab 10/28/13 2100  AST 18  ALT 16  ALKPHOS 42  BILITOT 0.4  PROT 6.5  ALBUMIN 3.5   No results found for this basename: LIPASE, AMYLASE,  in the last 168 hours No results found for this basename: AMMONIA,  in the last 168 hours CBC:  Recent Labs Lab 10/28/13 2100  WBC 4.0  HGB 12.1  HCT 33.8*  MCV 95.5  PLT 198   Cardiac Enzymes:  Recent  Labs Lab 10/28/13 2100  TROPONINI <0.30     Radiological Exams on Admission: Dg Chest Portable 1 View  10/28/2013   CLINICAL DATA:  Chest pain  EXAM: PORTABLE CHEST - 1 VIEW  COMPARISON:  06/26/2013  FINDINGS: Borderline heart size with normal pulmonary vascularity. Calcified and tortuous aorta. No focal airspace disease or consolidation in the lungs. No blunting of costophrenic angles. No pneumothorax. Incidental note of calcification superior to the left humeral head suggesting calcific tendinosis. No change since previous study.  IMPRESSION: No active disease.   Electronically Signed   By: Lucienne Capers M.D.   On: 10/28/2013 21:26    EKG: Independently reviewed. Normal sinus rhythm, right bundle branch block   Assessment/Plan Principal Problem:   Chest pain Active Problems:   HYPERLIPIDEMIA   HYPERTENSION   ATHEROSCLEROTIC CARDIOVASCULAR DISEASE  Chest pain Patient is currently chest pain-free, but had a typical pain in the left side of chest with radiation to left arm. We'll admit the patient under observation and obtain 3 sets  of cardiac enzymes.  Hypertension Continue Catapres, atenolol, amlodipine, spironolactone.  DVT prophylaxis Lovenox  Code status: Patient is full code  Family discussion: No family at bedside  Time Spent on Admission: 60 minutes  [place] [facility] Pager: [phone] 10/29/2013, 1:24 AM  If 7PM-7AM, please contact night-coverage  [email or web address]  Password [email or web address]

## 2013-10-29 NOTE — Progress Notes (Signed)
UR completed 

## 2013-10-29 NOTE — Plan of Care (Signed)
Problem: Phase I Progression Outcomes Goal: Hemodynamically stable Outcome: Progressing Vital signs are stable without medications Goal: Anginal pain relieved Outcome: Progressing Patient has no complaint of pain Goal: MD aware of Cardiac Marker results Outcome: Progressing Results have been negative    Goal: Voiding-avoid urinary catheter unless indicated Outcome: Completed/Met Date Met:  10/29/13 Voiding without difficulty

## 2013-10-29 NOTE — Progress Notes (Signed)
Echocardiogram 2D Echocardiogram has been performed.  Cindy Brandt 10/29/2013, 2:46 PM

## 2013-10-29 NOTE — Progress Notes (Signed)
[facility] PROGRESS NOTE  Cindy Brandt IRC:[id number] DOB: 03-15-1931 DOA: 10/28/2013 PCP: Leonides Grills, MD Brief narrative 80 year old female with significant coronary artery disease with NSTEMI in 2005, medically managed, history of diastolic dysfunction, hypertension, hyperlipidemia who presented with acute onset left-sided chest pain radiating to the left arm lasting for almost one hour with some relief after taking sublingual nitrate. Pain was 10/10 in intensity without any associated shortness of breath or palpitations. EKG in the ED showed normal sinus rhythm with a right bundle branch block (previous EKGs showed incomplete RBBB) Patient admitted on observation for rule out ACS  Assessment/Plan:  chest pain Symptoms appear typical. She did components have been negative and patient is stable on telemetry. She is currently chest pain-free. Continue daily aspirin. Continue when necessary sublingual nitrate. Will change atenolol to metoprolol 50 mg twice daily.  continue statin -Order 2-D echo. Cardiology consult in a.m. for evaluation on inpatient stress test. Her heart score is 4.  Hypertension On multiple blood pressure medications. Continue amlodipine, Aldactone and clonidine. Atenolol change to metoprolol  Hyperlipidemia Continue Zocor  Code Status: full code Family Communication: Her at bedside Disposition Plan: Home possibly tomorrow w/wo stress test   Consultants:  cardiology  Procedures:  none  Antibiotics:  none  HPI/Subjective: Denies further chest pain. Admission H&P reviewed  Objective: Filed Vitals:   10/29/13 0816  BP:   Pulse:   Temp: 98.4 F (36.9 C)  Resp:     Intake/Output Summary (Last 24 hours) at 10/29/13 0919 Last data filed at 10/29/13 0816  Gross per 24 hour  Intake    243 ml  Output   1200 ml  Net   -957 ml   Filed Weights   10/28/13 2043 10/29/13 0038  Weight: 62.596 kg (138 lb) 63 kg (138 lb 14.2 oz)     Exam:   General:  Elderly female in NAD  HEENT: no pallor, moist oral mucosa  Chest: clear b/l  CVS: N S1&S2, no murmurs  Abd: soft, NT, ND, BS+  EXT: Warm, no edema  CNS: Alert and oriented.    Data Reviewed: Basic Metabolic Panel:  Recent Labs Lab 10/28/13 2100 10/29/13 0135 10/29/13 0557  NA 139  --  139  K 4.0  --  4.2  CL 100  --  101  CO2 28  --  28  GLUCOSE 191*  --  173*  BUN 21  --  17  CREATININE 1.11* 0.92 0.88  CALCIUM 9.7  --  9.7   Liver Function Tests:  Recent Labs Lab 10/28/13 2100 10/29/13 0557  AST 18 17  ALT 16 16  ALKPHOS 42 40  BILITOT 0.4 0.7  PROT 6.5 6.6  ALBUMIN 3.5 3.4*   No results found for this basename: LIPASE, AMYLASE,  in the last 168 hours No results found for this basename: AMMONIA,  in the last 168 hours CBC:  Recent Labs Lab 10/28/13 2100 10/29/13 0135 10/29/13 0557  WBC 4.0 4.2 3.7*  HGB 12.1 11.9* 11.9*  HCT 33.8* 33.1* 34.3*  MCV 95.5 95.9 96.1  PLT 198 182 203   Cardiac Enzymes:  Recent Labs Lab 10/28/13 2100 10/29/13 0135 10/29/13 0600  TROPONINI <0.30 <0.30 <0.30   BNP (last 3 results) No results found for this basename: PROBNP,  in the last 8760 hours CBG: No results found for this basename: GLUCAP,  in the last 168 hours  Recent Results (from the past 240 hour(s))  MRSA PCR SCREENING  Status: None   Collection Time    10/29/13 12:37 AM      Result Value Ref Range Status   MRSA by PCR NEGATIVE  NEGATIVE Final   Comment:            The GeneXpert MRSA Assay (FDA     approved for NASAL specimens     only), is one component of a     comprehensive MRSA colonization     surveillance program. It is not     intended to diagnose MRSA     infection nor to guide or     monitor treatment for     MRSA infections.     Studies: Dg Chest Portable 1 View  10/28/2013   CLINICAL DATA:  Chest pain  EXAM: PORTABLE CHEST - 1 VIEW  COMPARISON:  06/26/2013  FINDINGS: Borderline heart size  with normal pulmonary vascularity. Calcified and tortuous aorta. No focal airspace disease or consolidation in the lungs. No blunting of costophrenic angles. No pneumothorax. Incidental note of calcification superior to the left humeral head suggesting calcific tendinosis. No change since previous study.  IMPRESSION: No active disease.   Electronically Signed   By: Lucienne Capers M.D.   On: 10/28/2013 21:26    Scheduled Meds: . amLODipine  10 mg Oral Daily  . aspirin EC  81 mg Oral q morning - 10a  . atenolol  100 mg Oral Daily  . cloNIDine  0.2 mg Oral BID  . enoxaparin (LOVENOX) injection  40 mg Subcutaneous Q24H  . simvastatin  20 mg Oral q morning - 10a  . sodium chloride  3 mL Intravenous Q12H  . sodium chloride  3 mL Intravenous Q12H  . spironolactone  25 mg Oral Daily   Continuous Infusions:      Time spent: 25 minutes    Shane Badeaux, [place]  [facility] Pager [phone] If 7PM-7AM, please contact night-coverage at [email or web address], password [facility] 10/29/2013, 9:19 AM  LOS: 1 day

## 2013-10-30 DIAGNOSIS — E785 Hyperlipidemia, unspecified: Secondary | ICD-10-CM

## 2013-10-30 DIAGNOSIS — R0789 Other chest pain: Secondary | ICD-10-CM

## 2013-10-30 DIAGNOSIS — I2510 Atherosclerotic heart disease of native coronary artery without angina pectoris: Secondary | ICD-10-CM

## 2013-10-30 DIAGNOSIS — R7309 Other abnormal glucose: Secondary | ICD-10-CM

## 2013-10-30 DIAGNOSIS — I10 Essential (primary) hypertension: Secondary | ICD-10-CM

## 2013-10-30 DIAGNOSIS — I519 Heart disease, unspecified: Secondary | ICD-10-CM

## 2013-10-30 LAB — HEMOGLOBIN A1C
Hgb A1c MFr Bld: 7.2 % — ABNORMAL HIGH (ref <5.7)
MEAN PLASMA GLUCOSE: 160 mg/dL — AB (ref <117)

## 2013-10-30 MED ORDER — ATORVASTATIN CALCIUM 40 MG PO TABS: 80.0000 mg | ORAL_TABLET | Freq: Every day | ORAL | Status: DC

## 2013-10-30 MED ORDER — NITROGLYCERIN 0.4 MG SL SUBL: 0.4000 mg | SUBLINGUAL_TABLET | SUBLINGUAL | Status: DC | PRN

## 2013-10-30 MED ORDER — METOPROLOL TARTRATE 50 MG PO TABS: 50.0000 mg | ORAL_TABLET | Freq: Two times a day (BID) | ORAL | Status: DC

## 2013-10-30 NOTE — Consult Note (Signed)
CARDIOLOGY CONSULT NOTE   Patient ID: Cindy Brandt MRN: [id number] DOB/AGE: March 21, 1931 80 y.o.  Admit Date: 10/28/2013 Referring Physician: PTH Primary Physician:McGough, Gwyndolyn Saxon MD  Consulting Cardiologist: Carlyle Dolly MD Primary Cardiologist: New Reason for Consultation: Chest Pain  Clinical Summary Cindy Brandt is a 80 y.o.female patient admitted with chest pain. She has known history of CAD with occulusion of the RCA with collaterals, and nonobstructive left system disease, with negative  NM stress test in 2013, and therfore treated medically. Other history includes hypertension, anxiety, arthritis , with recent injection to her left shoulder 2 weeks ago, and hyperlipidemia.   She was in her usual state of health until she returned from a family reunion on Saturday. She states that she did not overexert herself during the day. Specimens the time talking and eating. She was putting away her things at home, she began to feel what she described as gas pain, substernal non -radiating. She took some Tums, burped several times and began to feel a bit better. Pain returned described as pressure. She denied shortness of breath,, nausea, vomiting, or dizziness associated. She had some pain in her left arm, which she says is chronic for her after being diagnosed with arthritis. Recent injection to her left shoulder.   She called several family members, and her son brought her to ER. While waiting for son, she took one NTG and pain was relieved. Because of her history, family felt that she should be "checked out." She has been medically compliant. Of note, she recently lost her husband to death in Jul 26, 2013. Ever states that she has had a good deal of anxiety since that time. She does not relate the pain in her chest. It is and she has not had this pain since being seen in 2013.  On arrival to the emergency room, the patient's blood pressure was 120/69, heart rate 98, she was afebrile. Blood  glucose was elevated at 191. GFR 53. Cardiac enzymes have been negative x3.EKG NSR with RBBB with no evidence of ACS.   she was not found to be anemic. Potassium status is normal. She has been placed in ICU for observation . Has had no recurrent chest pain. In fact, she had no chest pain prior to arrival to the ER as one nitroglycerin relieved this at home. She is currently resting comfortably requesting to go home.    Medications Scheduled Medications: . amLODipine  10 mg Oral Daily  . aspirin EC  81 mg Oral q morning - 10a  . cloNIDine  0.2 mg Oral BID  . enoxaparin (LOVENOX) injection  40 mg Subcutaneous Q24H  . metoprolol tartrate  50 mg Oral BID  . simvastatin  20 mg Oral q morning - 10a  . sodium chloride  3 mL Intravenous Q12H  . sodium chloride  3 mL Intravenous Q12H  . spironolactone  25 mg Oral Daily      PRN Medications: sodium chloride, dicyclomine, diphenhydrAMINE, HYDROcodone-acetaminophen, LORazepam, nitroGLYCERIN, ondansetron (ZOFRAN) IV, ondansetron, sodium chloride   Past Medical History  Diagnosis Date  . Coronary atherosclerosis of native coronary artery     Occluded RCA (unsuccessful PCI 2003), nonobstructive left system 2005   . Hyperlipidemia   . Hypertension   . Anxiety   . Headache(784.0)   . NSTEMI (non-ST elevated myocardial infarction)     Managed medically 2005  . Diastolic dysfunction 0/81/4481    Per echo. Grade 1. Ejection fraction 60-65%.; Akinesis of the basal inferior myocardium.  Marland Kitchen  Anxiety     Past Surgical History  Procedure Laterality Date  . Cholecystectomy  2004  . Abdominal hysterectomy    . Hernia repair      Umbilical hernia repair as a child  . Colonoscopy  08/06/2011    Procedure: COLONOSCOPY;  Surgeon: [name], MD;  Location: [facility];  Service: Endoscopy;  Laterality: N/A;  10:40 AM    Family History  Problem Relation Age of Onset  . Coronary artery disease Father   . Coronary artery disease Mother   . Diabetes  type II Father   . Diabetes type II Mother     Social History Cindy Brandt reports that she has quit smoking. Her smoking use included Cigarettes. She has a 12.5 pack-year smoking history. She does not have any smokeless tobacco history on file. Cindy Brandt reports that she does not drink alcohol.  Review of Systems Otherwise reviewed and negative except as outlined.  Physical Examination Blood pressure 127/72, pulse 56, temperature 98.3 F (36.8 C), temperature source Oral, resp. rate 15, height 5\' 2"  (1.575 m), weight 138 lb 3.7 oz (62.7 kg), SpO2 95.00%.  Intake/Output Summary (Last 24 hours) at 10/30/13 9604 Last data filed at 10/29/13 2208  Gross per 24 hour  Intake    723 ml  Output    600 ml  Net    123 ml    Telemetry: Normal sinus rhythm with right bundle branch block rates in the 60's.   GEN: Resting comfortably, in no acute distress.  HEENT: Conjunctiva and lids normal, oropharynx clear with moist mucosa. Neck: Supple, no elevated JVP or carotid bruits, no thyromegaly. Lungs: Clear to auscultation, nonlabored breathing at rest. Cardiac: Regular rate and rhythm, no S3 or significant systolic murmur, no pericardial rub. Abdomen: Soft, nontender, no hepatomegaly, bowel sounds present, no guarding or rebound. Extremities: No pitting edema, distal pulses 2+. Skin: Warm and dry. Musculoskeletal: No kyphosis. Neuropsychiatric: Alert and oriented x3, affect grossly appropriate.  Prior Cardiac Testing/Procedures 1. Echocardiogram 10/29/2013 Left ventricle: The cavity size was normal. Wall thickness was increased in a pattern of mild LVH. Systolic function was normal. The estimated ejection fraction was in the range of 55% to 60%. Wall motion was normal; there were no regional wall motion abnormalities. There was an increased relative contribution of atrial contraction to ventricular filling. Doppler parameters are consistent with abnormal left ventricular relaxation (grade  1 diastolic dysfunction). - Aortic valve: Mildly calcified annulus. Trileaflet; mildly thickened leaflets. - Mitral valve: Mildly calcified annulus. Mildly thickened leaflets - Left atrium: The atrium was mildly dilated. - Right ventricle: The cavity size was normal. Wall thickness was normal. Systolic function was mildly reduced. - Tricuspid valve: Mildly thickened leaflets.  2. NM Stress Test 11/2011 Scintigraphic Data: Acquisition notable for moderate breast  attenuation. Left ventricular size was normal. On tomographic  images reconstructed in standard planes, there was a minor degree  of anteroseptal thinning that was not numerically significant by  quantitative analysis and for which no reversibility was apparent.  The gated reconstruction demonstrated normal regional and global LV  systolic function as well as normal systolic accentuation of  activity throughout. Estimated ejection fraction was 62%.  IMPRESSION:  Negative pharmacologic stress nuclear myocardial study.  3. Cardiac Cath 10/29/2003 CONCLUSION: Coronary artery disease, status post prior unsuccessful  percutaneous coronary intervention of the right coronary artery in 2003 with  20 and 30% narrowing of the left main coronary artery, irregularities in the  left anterior descending coronary  artery, 40% narrowing in the mid  circumflex artery, and total occlusion of the mid to distal right coronary  artery with slight inferior wall hypokinesis.  RECOMMENDATIONS: I suspect the culprit for the patient's recent non-ST  elevation infarction is related to occlusion of the right coronary artery.  This is a small vessel and she has good collaterals from the left side and I  think medical therapy would be the best option. Will plan continued medical  therapy.    Lab Results  Basic Metabolic Panel:  Recent Labs Lab 10/28/13 2100 10/29/13 0135 10/29/13 0557  NA 139  --  139  K 4.0  --  4.2  CL 100  --  101  CO2 28   --  28  GLUCOSE 191*  --  173*  BUN 21  --  17  CREATININE 1.11* 0.92 0.88  CALCIUM 9.7  --  9.7    Liver Function Tests:  Recent Labs Lab 10/28/13 2100 10/29/13 0557  AST 18 17  ALT 16 16  ALKPHOS 42 40  BILITOT 0.4 0.7  PROT 6.5 6.6  ALBUMIN 3.5 3.4*    CBC:  Recent Labs Lab 10/28/13 2100 10/29/13 0135 10/29/13 0557  WBC 4.0 4.2 3.7*  HGB 12.1 11.9* 11.9*  HCT 33.8* 33.1* 34.3*  MCV 95.5 95.9 96.1  PLT 198 182 203    Cardiac Enzymes:  Recent Labs Lab 10/28/13 2100 10/29/13 0135 10/29/13 0600 10/29/13 1308  TROPONINI <0.30 <0.30 <0.30 <0.30     Radiology: Dg Chest Portable 1 View  10/28/2013   CLINICAL DATA:  Chest pain  EXAM: PORTABLE CHEST - 1 VIEW  COMPARISON:  06/26/2013  FINDINGS: Borderline heart size with normal pulmonary vascularity. Calcified and tortuous aorta. No focal airspace disease or consolidation in the lungs. No blunting of costophrenic angles. No pneumothorax. Incidental note of calcification superior to the left humeral head suggesting calcific tendinosis. No change since previous study.  IMPRESSION: No active disease.   Electronically Signed   By: Lucienne Capers M.D.   On: 10/28/2013 21:26     ECG: NSR with RBBB.   Impression and Recommendations  1.Chest Pain: She has no history of CAD, but has been ruled out for ACS, but negative troponins, EKG, and has had no recurrence of chest pain. In fact, she was pain-free on arrival to the emergency room, but came at the insistence of family members to be "checked out." Pain was relieved with one nitroglycerin and TUMS prior to comining to the hospital. She is anxious to return home. She has been medically compliant. I would favor continued medical management, followup appointment as an outpatient for consideration of repeat stress test.Woud not keep her in the hospital as she would really like to return home. Consider GI etiology for discomfort as well.  2. CAD: Most recent nuclear medicine  stress test in 2013 was negative for new ischemia. She has not been seen in our office since that time. Most recent cardiac catheterization described above. In 2005 demonstrated, 20, LAD disease, right coronary artery occlusion, with collateral filling, 40% in the mid circumflex, with preserved ejection fraction. We will continue medical management for now with metoprolol, 50 mg twice a day, statin therapy, and aspirin. Consider decreasing the Toprol due to bradycardia, however, heart rate is slow at rest on arrival to ER. It was normal.  3. Hypertension: Currently well controlled. She remains on amlodipine, 10 mg daily, clonidine, 0.2 mg twice a day, along with beta blocker therapy. Creatinine  0.88.   4. Hyperlipidemia: Cholesterol status is within normal limits. The patient was CAD. Continue statin therapy.    Signed: Phill Myron. Lawrence NP  10/30/2013, 8:21 AM Co-Sign MD  Attending Note Patient seen and discussed with NP Purcell Nails. 80 yo female hx of CAD treated medically, HTN, anxiety, HL admitted with chest pain/epigastric pain with no associated SOB, palpitations or diaphoresis. Better with belching. Describes some left arm pain as well however this is chronic. Symptoms did improve with NG x1. No evidence of ACS trops neg x4, EKG SR chronic RBBB with typical associated anterior ST/T changes. 11/2011 nuclear stress without ischemia. No indication for inpatient stress testing at this time. Given her hx of CAD change to high dose statin based on most recent guidelines, change to atorva 80mg  daily. She will f/u with NP Lawerence in 2 weeks, I have called our office to schedule. Will sign off of inpatient care.    Zandra Abts MD

## 2013-10-30 NOTE — Progress Notes (Signed)
Present with patient and family for emotional/spiritual support in addressing grief and loss due to her husband's death in 09-Jul-2013. She was expressive about her family and their support since his death. She stated she feels very connected and supported especially within her family. She's receiving grief support through [facility]. Prayed with her also.

## 2013-10-30 NOTE — Discharge Instructions (Signed)
Chest Pain Observation  It is often hard to give a specific diagnosis for the cause of chest pain. Among other possibilities your symptoms might be caused by inadequate oxygen delivery to your heart (angina). Angina that is not treated or evaluated can lead to a heart attack (myocardial infarction) or death.  Blood tests, electrocardiograms, and X-rays may have been done to help determine a possible cause of your chest pain. After evaluation and observation, your health care provider has determined that it is unlikely your pain was caused by an unstable condition that requires hospitalization. However, a full evaluation of your pain may need to be completed, with additional diagnostic testing as directed. It is very important to keep your follow-up appointments. Not keeping your follow-up appointments could result in permanent heart damage, disability, or death. If there is any problem keeping your follow-up appointments, you must call your health care provider.  HOME CARE INSTRUCTIONS   Due to the slight chance that your pain could be angina, it is important to follow your health care provider's treatment plan and also maintain a healthy lifestyle:  · Maintain or work toward achieving a healthy weight.  · Stay physically active and exercise regularly.  · Decrease your salt intake.  · Eat a balanced, healthy diet. Talk to a dietitian to learn about heart-healthy foods.  · Increase your fiber intake by including whole grains, vegetables, fruits, and nuts in your diet.  · Avoid situations that cause stress, anger, or depression.  · Take medicines as advised by your health care provider. Report any side effects to your health care provider. Do not stop medicines or adjust the dosages on your own.  · Quit smoking. Do not use nicotine patches or gum until you check with your health care provider.  · Keep your blood pressure, blood sugar, and cholesterol levels within normal limits.  · Limit alcohol intake to no more than  1 drink per day for women who are not pregnant and 2 drinks per day for men.  · Do not abuse drugs.  SEEK IMMEDIATE MEDICAL CARE IF:  You have severe chest pain or pressure which may include symptoms such as:  · You feel pain or pressure in your arms, neck, jaw, or back.  · You have severe back or abdominal pain, feel sick to your stomach (nauseous), or throw up (vomit).  · You are sweating profusely.  · You are having a fast or irregular heartbeat.  · You feel short of breath while at rest.  · You notice increasing shortness of breath during rest, sleep, or with activity.  · You have chest pain that does not get better after rest or after taking your usual medicine.  · You wake from sleep with chest pain.  · You are unable to sleep because you cannot breathe.  · You develop a frequent cough or you are coughing up blood.  · You feel dizzy, faint, or experience extreme fatigue.  · You develop severe weakness, dizziness, fainting, or chills.  Any of these symptoms may represent a serious problem that is an emergency. Do not wait to see if the symptoms will go away. Call your local emergency services (911 in the [place] Do not drive yourself to the hospital.  MAKE SURE YOU:  · Understand these instructions.  · Will watch your condition.  · Will get help right away if you are not doing well or get worse.  Document Released: 03/21/2010 Document Revised: 02/21/2013 Document Reviewed: 08/18/2012    ExitCare® Patient Information ©2015 [employer]. This information is not intended to replace advice given to you by your health care provider. Make sure you discuss any questions you have with your health care provider.

## 2013-10-30 NOTE — Discharge Summary (Signed)
Physician Discharge Summary  Cindy Brandt NWG:[id number] DOB: 1931-05-05 DOA: 10/28/2013  PCP: Leonides Grills, MD  Admit date: 10/28/2013 Discharge date: 10/30/2013  Time spent: 25  minutes  Recommendations for Outpatient Follow-up:  1. D/c home with outpt PCP and cardiology follow up 2. Follow up HbA1C given elevated fsg  Discharge Diagnoses:  Principal Problem:   Chest pain  Active Problems:   HYPERLIPIDEMIA   HYPERTENSION   Coronary Artery disease   Elevated random blood glucose level   Discharge Condition: fair  Diet recommendation: cardiac  Filed Weights   10/28/13 2043 10/29/13 0038 10/30/13 0500  Weight: 62.596 kg (138 lb) 63 kg (138 lb 14.2 oz) 62.7 kg (138 lb 3.7 oz)    History of present illness:  Please refer to admission H&P for details, but in brief, 80 year old female with significant coronary artery disease with NSTEMI in 2005, medically managed, history of diastolic dysfunction, hypertension, hyperlipidemia who presented with acute onset left-sided chest pain radiating to the left arm lasting for almost one hour with some relief after taking sublingual nitrate. Pain was 10/10 in intensity without any associated shortness of breath or palpitations.  EKG in the ED showed normal sinus rhythm with a right bundle branch block (previous EKGs showed incomplete RBBB)  Patient admitted to  observation for rule out ACS.   Hospital Course:  chest pain  Symptoms appear typical. She did components have been negative and patient is stable on telemetry. She is currently chest pain-free.  Her heart score is 4.  Continue daily aspirin. Continue when necessary sublingual nitrate. changed atenolol to metoprolol 50 mg twice daily. continue statin  -Order 2-D echo shows normal EF with grade 1 diastolic dysfunction.  . Cardiology consulted and recommended stable fro d/c home with outpt follow up for stress test.  Hypertension  On multiple blood pressure medications.   Continue amlodipine, Aldactone and clonidine. Atenolol change to metoprolol   Hyperlipidemia  Continue Zocor   Code Status: full code   Family Communication: spoke with daughter on 8/30 Disposition Plan: Home    Consultants:  Cardiology   Procedures:  none Antibiotics:  none    Discharge Exam: Filed Vitals:   10/30/13 0810  BP:   Pulse:   Temp: 98.3 F (36.8 C)  Resp:    General: Elderly female in NAD  HEENT: no pallor, moist oral mucosa  Chest: clear b/l  CVS: N S1&S2, no murmurs  Abd: soft, NT, ND, BS+  EXT: Warm, no edema  CNS: Alert and oriented.    Discharge Instructions You were cared for by a hospitalist during your hospital stay. If you have any questions about your discharge medications or the care you received while you were in the hospital after you are discharged, you can call the unit and asked to speak with the hospitalist on call if the hospitalist that took care of you is not available. Once you are discharged, your primary care physician will handle any further medical issues. Please note that NO REFILLS for any discharge medications will be authorized once you are discharged, as it is imperative that you return to your primary care physician (or establish a relationship with a primary care physician if you do not have one) for your aftercare needs so that they can reassess your need for medications and monitor your lab values.     Medication List    STOP taking these medications       atenolol 100 MG tablet  Commonly known as:  TENORMIN      TAKE these medications       alendronate 70 MG tablet  Commonly known as:  FOSAMAX  Take 70 mg by mouth once a week. Takes on Saturdays/Sundays     amLODipine 10 MG tablet  Commonly known as:  NORVASC  Take 10 mg by mouth daily.     aspirin EC 81 MG tablet  Take 81 mg by mouth every morning.     cloNIDine 0.2 MG tablet  Commonly known as:  CATAPRES  Take 0.2 mg by mouth 2 (two) times daily.      dicyclomine 10 MG capsule  Commonly known as:  BENTYL  Take 10 mg by mouth daily as needed for spasms.     diphenhydrAMINE 25 mg capsule  Commonly known as:  BENADRYL  Take 25 mg by mouth every 6 (six) hours as needed for allergies.     HYDROcodone-acetaminophen 5-325 MG per tablet  Commonly known as:  NORCO/VICODIN  Take 1 tablet by mouth every 4 (four) hours as needed (pain).     LORazepam 1 MG tablet  Commonly known as:  ATIVAN  Take 1 mg by mouth every 8 (eight) hours as needed for anxiety or sleep. Anxiety     metoprolol 50 MG tablet  Commonly known as:  LOPRESSOR  Take 1 tablet (50 mg total) by mouth 2 (two) times daily.     nitroGLYCERIN 0.4 MG SL tablet  Commonly known as:  NITROSTAT  Place 0.4 mg under the tongue every 5 (five) minutes as needed. Chest Pain     simvastatin 20 MG tablet  Commonly known as:  ZOCOR  Take 20 mg by mouth every morning.     spironolactone 25 MG tablet  Commonly known as:  ALDACTONE  Take 25 mg by mouth daily.       Allergies  Allergen Reactions  . Tylenol [Acetaminophen] Other (See Comments)    Funny feeling in chest.        Follow-up Information   Follow up with Herminio Commons, MD.   Specialty:  Cardiology   Contact information:   [place] [address] [phone]       Follow up with Leonides Grills, MD. Schedule an appointment as soon as possible for a visit in 1 week.   Specialty:  Family Medicine   Contact information:   [address] [place] [address] [phone]        The results of significant diagnostics from this hospitalization (including imaging, microbiology, ancillary and laboratory) are listed below for reference.    Significant Diagnostic Studies: Dg Chest Portable 1 View  10/28/2013   CLINICAL DATA:  Chest pain  EXAM: PORTABLE CHEST - 1 VIEW  COMPARISON:  06/26/2013  FINDINGS: Borderline heart size with normal pulmonary vascularity. Calcified and tortuous  aorta. No focal airspace disease or consolidation in the lungs. No blunting of costophrenic angles. No pneumothorax. Incidental note of calcification superior to the left humeral head suggesting calcific tendinosis. No change since previous study.  IMPRESSION: No active disease.   Electronically Signed   By: Lucienne Capers M.D.   On: 10/28/2013 21:26    Microbiology: Recent Results (from the past 240 hour(s))  MRSA PCR SCREENING     Status: None   Collection Time    10/29/13 12:37 AM      Result Value Ref Range Status   MRSA by PCR NEGATIVE  NEGATIVE Final   Comment:  The GeneXpert MRSA Assay (FDA     approved for NASAL specimens     only), is one component of a     comprehensive MRSA colonization     surveillance program. It is not     intended to diagnose MRSA     infection nor to guide or     monitor treatment for     MRSA infections.     Labs: Basic Metabolic Panel:  Recent Labs Lab 10/28/13 2100 10/29/13 0135 10/29/13 0557  NA 139  --  139  K 4.0  --  4.2  CL 100  --  101  CO2 28  --  28  GLUCOSE 191*  --  173*  BUN 21  --  17  CREATININE 1.11* 0.92 0.88  CALCIUM 9.7  --  9.7   Liver Function Tests:  Recent Labs Lab 10/28/13 2100 10/29/13 0557  AST 18 17  ALT 16 16  ALKPHOS 42 40  BILITOT 0.4 0.7  PROT 6.5 6.6  ALBUMIN 3.5 3.4*   No results found for this basename: LIPASE, AMYLASE,  in the last 168 hours No results found for this basename: AMMONIA,  in the last 168 hours CBC:  Recent Labs Lab 10/28/13 2100 10/29/13 0135 10/29/13 0557  WBC 4.0 4.2 3.7*  HGB 12.1 11.9* 11.9*  HCT 33.8* 33.1* 34.3*  MCV 95.5 95.9 96.1  PLT 198 182 203   Cardiac Enzymes:  Recent Labs Lab 10/28/13 2100 10/29/13 0135 10/29/13 0600 10/29/13 1308  TROPONINI <0.30 <0.30 <0.30 <0.30   BNP: BNP (last 3 results) No results found for this basename: PROBNP,  in the last 8760 hours CBG: No results found for this basename: GLUCAP,  in the last 168  hours     Signed:  Raiyan Dalesandro  [facility] 10/30/2013, 10:58 AM

## 2013-10-30 NOTE — Progress Notes (Signed)
UR Completed.  Cindy Brandt Jane [phone] 10/30/2013  

## 2013-10-30 NOTE — Progress Notes (Signed)
Patient given discharge instructions with daughter at bedside. Explained when medications were due next. All questions answered about medications. Explained importance of keeping follow-up appointments. Patient taken out of facility by RN on unit. Left with family in stable condition via personal vehicle.

## 2013-11-10 NOTE — Progress Notes (Signed)
    Error. Cancelled appt 

## 2013-11-13 ENCOUNTER — Encounter: Payer: Medicare Other | Admitting: Adult Health

## 2013-11-14 ENCOUNTER — Encounter: Payer: Self-pay | Admitting: *Deleted

## 2013-12-22 ENCOUNTER — Other Ambulatory Visit: Payer: Self-pay | Admitting: Internal Medicine

## 2014-02-27 ENCOUNTER — Encounter: Payer: Self-pay | Admitting: *Deleted

## 2014-03-14 DIAGNOSIS — H40023 Open angle with borderline findings, high risk, bilateral: Secondary | ICD-10-CM | POA: Diagnosis not present

## 2014-03-14 DIAGNOSIS — H268 Other specified cataract: Secondary | ICD-10-CM | POA: Diagnosis not present

## 2014-03-20 DIAGNOSIS — N342 Other urethritis: Secondary | ICD-10-CM | POA: Diagnosis not present

## 2014-03-20 DIAGNOSIS — Z6825 Body mass index (BMI) 25.0-25.9, adult: Secondary | ICD-10-CM | POA: Diagnosis not present

## 2014-03-20 DIAGNOSIS — F419 Anxiety disorder, unspecified: Secondary | ICD-10-CM | POA: Diagnosis not present

## 2014-03-20 DIAGNOSIS — G894 Chronic pain syndrome: Secondary | ICD-10-CM | POA: Diagnosis not present

## 2014-06-05 DIAGNOSIS — G894 Chronic pain syndrome: Secondary | ICD-10-CM | POA: Diagnosis not present

## 2014-06-05 DIAGNOSIS — E782 Mixed hyperlipidemia: Secondary | ICD-10-CM | POA: Diagnosis not present

## 2014-09-17 DIAGNOSIS — M25519 Pain in unspecified shoulder: Secondary | ICD-10-CM | POA: Diagnosis not present

## 2014-09-17 DIAGNOSIS — Z1389 Encounter for screening for other disorder: Secondary | ICD-10-CM | POA: Diagnosis not present

## 2014-12-12 DIAGNOSIS — E119 Type 2 diabetes mellitus without complications: Secondary | ICD-10-CM | POA: Diagnosis not present

## 2014-12-12 DIAGNOSIS — Z1389 Encounter for screening for other disorder: Secondary | ICD-10-CM | POA: Diagnosis not present

## 2014-12-12 DIAGNOSIS — Z23 Encounter for immunization: Secondary | ICD-10-CM | POA: Diagnosis not present

## 2014-12-12 DIAGNOSIS — Z0000 Encounter for general adult medical examination without abnormal findings: Secondary | ICD-10-CM | POA: Diagnosis not present

## 2015-03-25 DIAGNOSIS — Z1389 Encounter for screening for other disorder: Secondary | ICD-10-CM | POA: Diagnosis not present

## 2015-03-25 DIAGNOSIS — N183 Chronic kidney disease, stage 3 (moderate): Secondary | ICD-10-CM | POA: Diagnosis not present

## 2015-04-30 DIAGNOSIS — I10 Essential (primary) hypertension: Secondary | ICD-10-CM | POA: Diagnosis not present

## 2015-04-30 DIAGNOSIS — R809 Proteinuria, unspecified: Secondary | ICD-10-CM | POA: Diagnosis not present

## 2015-04-30 DIAGNOSIS — N179 Acute kidney failure, unspecified: Secondary | ICD-10-CM | POA: Diagnosis not present

## 2015-05-06 ENCOUNTER — Other Ambulatory Visit (HOSPITAL_COMMUNITY): Payer: Self-pay | Admitting: Nephrology

## 2015-05-06 DIAGNOSIS — N183 Chronic kidney disease, stage 3 unspecified: Secondary | ICD-10-CM

## 2015-05-20 DIAGNOSIS — N183 Chronic kidney disease, stage 3 (moderate): Secondary | ICD-10-CM | POA: Diagnosis not present

## 2015-05-20 DIAGNOSIS — R809 Proteinuria, unspecified: Secondary | ICD-10-CM | POA: Diagnosis not present

## 2015-05-20 DIAGNOSIS — D509 Iron deficiency anemia, unspecified: Secondary | ICD-10-CM | POA: Diagnosis not present

## 2015-05-20 DIAGNOSIS — E559 Vitamin D deficiency, unspecified: Secondary | ICD-10-CM | POA: Diagnosis not present

## 2015-05-20 DIAGNOSIS — I10 Essential (primary) hypertension: Secondary | ICD-10-CM | POA: Diagnosis not present

## 2015-05-20 DIAGNOSIS — Z79899 Other long term (current) drug therapy: Secondary | ICD-10-CM | POA: Diagnosis not present

## 2015-05-22 ENCOUNTER — Ambulatory Visit (HOSPITAL_COMMUNITY)
Admission: RE | Admit: 2015-05-22 | Discharge: 2015-05-22 | Disposition: A | Discharge status: Home / Self Care | Payer: Medicare Other | Source: Ambulatory Visit | Attending: Nephrology | Admitting: Nephrology

## 2015-05-22 DIAGNOSIS — N183 Chronic kidney disease, stage 3 unspecified: Secondary | ICD-10-CM

## 2015-06-11 DIAGNOSIS — I10 Essential (primary) hypertension: Secondary | ICD-10-CM | POA: Diagnosis not present

## 2015-06-11 DIAGNOSIS — N179 Acute kidney failure, unspecified: Secondary | ICD-10-CM | POA: Diagnosis not present

## 2015-06-11 DIAGNOSIS — R809 Proteinuria, unspecified: Secondary | ICD-10-CM | POA: Diagnosis not present

## 2015-06-20 DIAGNOSIS — Z1389 Encounter for screening for other disorder: Secondary | ICD-10-CM | POA: Diagnosis not present

## 2015-06-20 DIAGNOSIS — G894 Chronic pain syndrome: Secondary | ICD-10-CM | POA: Diagnosis not present

## 2015-06-20 DIAGNOSIS — M65819 Other synovitis and tenosynovitis, unspecified shoulder: Secondary | ICD-10-CM | POA: Diagnosis not present

## 2015-07-03 ENCOUNTER — Other Ambulatory Visit: Payer: Self-pay | Admitting: Physician Assistant

## 2015-07-03 DIAGNOSIS — Z1231 Encounter for screening mammogram for malignant neoplasm of breast: Secondary | ICD-10-CM

## 2015-07-22 ENCOUNTER — Ambulatory Visit
Admission: RE | Admit: 2015-07-22 | Discharge: 2015-07-22 | Disposition: A | Discharge status: Home / Self Care | Payer: Medicare Other | Source: Ambulatory Visit | Attending: Physician Assistant | Admitting: Physician Assistant

## 2015-07-22 DIAGNOSIS — Z1231 Encounter for screening mammogram for malignant neoplasm of breast: Secondary | ICD-10-CM

## 2015-10-10 DIAGNOSIS — M545 Low back pain: Secondary | ICD-10-CM | POA: Diagnosis not present

## 2015-10-29 ENCOUNTER — Ambulatory Visit (INDEPENDENT_AMBULATORY_CARE_PROVIDER_SITE_OTHER): Payer: Medicare Other | Admitting: Primary Care

## 2015-10-29 ENCOUNTER — Encounter: Payer: Self-pay | Admitting: Primary Care

## 2015-10-29 DIAGNOSIS — F418 Other specified anxiety disorders: Secondary | ICD-10-CM

## 2015-10-29 DIAGNOSIS — K589 Irritable bowel syndrome without diarrhea: Secondary | ICD-10-CM

## 2015-10-29 DIAGNOSIS — M159 Polyosteoarthritis, unspecified: Secondary | ICD-10-CM

## 2015-10-29 DIAGNOSIS — I10 Essential (primary) hypertension: Secondary | ICD-10-CM | POA: Diagnosis not present

## 2015-10-29 DIAGNOSIS — F329 Major depressive disorder, single episode, unspecified: Secondary | ICD-10-CM

## 2015-10-29 DIAGNOSIS — K219 Gastro-esophageal reflux disease without esophagitis: Secondary | ICD-10-CM | POA: Insufficient documentation

## 2015-10-29 DIAGNOSIS — R32 Unspecified urinary incontinence: Secondary | ICD-10-CM

## 2015-10-29 DIAGNOSIS — I2510 Atherosclerotic heart disease of native coronary artery without angina pectoris: Secondary | ICD-10-CM | POA: Diagnosis not present

## 2015-10-29 DIAGNOSIS — E785 Hyperlipidemia, unspecified: Secondary | ICD-10-CM

## 2015-10-29 DIAGNOSIS — M1990 Unspecified osteoarthritis, unspecified site: Secondary | ICD-10-CM | POA: Insufficient documentation

## 2015-10-29 DIAGNOSIS — F419 Anxiety disorder, unspecified: Secondary | ICD-10-CM

## 2015-10-29 NOTE — Assessment & Plan Note (Signed)
Managed on simvastatin 20 g. Check lipids during next visit. Will obtain records for recent labs.

## 2015-10-29 NOTE — Patient Instructions (Signed)
Wean off Lorazepam 1 mg tablets.  Start taking 1/2 tablet by mouth every night at bedtime for 2 weeks, then 1/2 tablet by mouth every other night, then stop.  Try Melatonin 3-5 mg tablets for sleep. Take 1 tablet by mouth 1 hour prior to bedtime.  Wean off Hydrocodone pain medication.  Start taking 1/2 tablet by mouth once daily as needed for pain for the next 2-3 weeks.   Try substituting with Tylenol Arthritis or Extra Strength Tylenol as needed for pain. It's important to remain active to prevent stiffness. I recommend you walk 15 minutes once daily.  Work to make improvements in your diet by limiting fried, fatty, greasy, acidic, spicy foods. Take a look at the information below regarding trigger foods for acid reflux.  Follow up in 6 months for re-evaluation.  It was a pleasure to meet you today! Please don't hesitate to call me with any questions. Welcome to [facility]!  Food Choices for Gastroesophageal Reflux Disease, Adult When you have gastroesophageal reflux disease (GERD), the foods you eat and your eating habits are very important. Choosing the right foods can help ease the discomfort of GERD. WHAT GENERAL GUIDELINES DO I NEED TO FOLLOW?  Choose fruits, vegetables, whole grains, low-fat dairy products, and low-fat meat, fish, and poultry.  Limit fats such as oils, salad dressings, butter, nuts, and avocado.  Keep a food diary to identify foods that cause symptoms.  Avoid foods that cause reflux. These may be different for different people.  Eat frequent small meals instead of three large meals each day.  Eat your meals slowly, in a relaxed setting.  Limit fried foods.  Cook foods using methods other than frying.  Avoid drinking alcohol.  Avoid drinking large amounts of liquids with your meals.  Avoid bending over or lying down until 2-3 hours after eating. WHAT FOODS ARE NOT RECOMMENDED? The following are some foods and drinks that may worsen your  symptoms: Vegetables Tomatoes. Tomato juice. Tomato and spaghetti sauce. Chili peppers. Onion and garlic. Horseradish. Fruits Oranges, grapefruit, and lemon (fruit and juice). Meats High-fat meats, fish, and poultry. This includes hot dogs, ribs, ham, sausage, salami, and bacon. Dairy Whole milk and chocolate milk. Sour cream. Cream. Butter. Ice cream. Cream cheese.  Beverages Coffee and tea, with or without caffeine. Carbonated beverages or energy drinks. Condiments Hot sauce. Barbecue sauce.  Sweets/Desserts Chocolate and cocoa. Donuts. Peppermint and spearmint. Fats and Oils High-fat foods, including [place] fries and potato chips. Other Vinegar. Strong spices, such as black pepper, white pepper, red pepper, cayenne, curry powder, cloves, ginger, and chili powder. The items listed above may not be a complete list of foods and beverages to avoid. Contact your dietitian for more information.   This information is not intended to replace advice given to you by your health care provider. Make sure you discuss any questions you have with your health care provider.   Document Released: 02/16/2005 Document Revised: 03/09/2014 Document Reviewed: 12/21/2012 Elsevier Interactive Patient Education [employer].

## 2015-10-29 NOTE — Progress Notes (Signed)
Subjective:    Patient ID: Cindy Brandt, female    DOB: November 11, 1931, 82 y.o.   MRN: [id number]  HPI  Cindy Brandt is an 82 year old female who presents today to establish care and discuss the problems mentioned below. Will obtain old records.Patient is here with her daughter today. Patient is a poor historian overall.  1) Essential Hypertension: Currently managed on Atenolol 100 mg, Clonidine 0.2 mg BID, and spironolactone 25 mg. She checks her BP at home but cannot remember these readings. Her daughter endorses normal readings.  2) Hyperlipidemia/CAD: Currently managed on Simvastatin 20 mg and aspirin 81 mg. History of CAD with occluded RCA in 2003, NSTEMI in [id number], and diastolic dysfunction. Follows with [facility], but has not recently followed in over 1 year.   3) Renal Failure Stage III: Renal Ultrasound in March 2017 which was unremarkable. Evaluated with Dr. Lowanda Foster (nephrology) with in [place]. She was told to follow up as needed.  4) Depression and Generalized Anxiety Disorder: History of since the death of her husband 2 years ago. Currently managed on Zoloft 100 mg and Lorazepam 1 mg. She takes her Lorazepam every night at bedtime and has been doing so for the past 20 years.   5) Urinary Incontinence: Diagnosed 6 months ago. Currently managed on Myrbetriq 25 mg. Overall has noticed improvement.   6) IBS: Currently managed on Bentyl 10 mg as needed for abdominal spasms. Prior use infrequently, recently has been taking twice daily as she was confused on how she should take this medication.   7) GERD: Currently managed on Omeprazole 20 mg once daily. Symptoms occur mostly at night and when walking upstairs. Symptoms have been present over the past 3 months. She will take Tums which causes belching and discomfort will improve instantly. She eats a diet with hamburgers, fried and grilled chicken, pasta. Denies dizziness, chest pain, and shortness of breath.   8) Osteoarthritis:  Currently managed on Norco for which she takes 1-2 times daily and has done so for the past several years. She has tried Tylenol in the past which caused chest discomfort. She has a history of CKD stage III and cannot take NSAIDs. She's not tried tramadol in the past for pain.  Review of Systems  Constitutional: Negative for unexpected weight change.  Respiratory: Negative for shortness of breath.   Cardiovascular: Negative for chest pain.  Gastrointestinal: Negative for abdominal pain.       Gerd symptoms  Genitourinary: Negative for difficulty urinating.       Urinary incontinence, improved with treatment.  Musculoskeletal: Positive for arthralgias.  Skin: Negative for color change.  Allergic/Immunologic: Negative for environmental allergies.  Neurological: Negative for dizziness and numbness.  Psychiatric/Behavioral: Negative for sleep disturbance and suicidal ideas. The patient is not nervous/anxious.        Past Medical History:  Diagnosis Date  . Anxiety   . Anxiety   . Arthritis   . Coronary atherosclerosis of native coronary artery    Occluded RCA (unsuccessful PCI 2003), nonobstructive left system 2005   . Diastolic dysfunction [id number]   Per echo. Grade 1. Ejection fraction 60-65%.; Akinesis of the basal inferior myocardium.  Marland Kitchen Headache(784.0)   . Hyperlipidemia   . Hypertension   . NSTEMI (non-ST elevated myocardial infarction) [facility])    Managed medically 2005     Social History   Social History  . Marital status: Married    Spouse name: N/A  . Number of children: N/A  . Years  of education: N/A   Occupational History  . Retired    Social History Main Topics  . Smoking status: Former Smoker    Packs/day: 0.50    Years: 25.00    Types: Cigarettes  . Smokeless tobacco: Not on file  . Alcohol use No  . Drug use: No  . Sexual activity: Not on file   Other Topics Concern  . Not on file   Social History Narrative   No regular exercise    Past Surgical  History:  Procedure Laterality Date  . ABDOMINAL HYSTERECTOMY    . CHOLECYSTECTOMY  2004  . COLONOSCOPY  08/06/2011   Procedure: COLONOSCOPY;  Surgeon: [name], MD;  [facility];  Service: Endoscopy;  Laterality: N/A;  10:40 AM  . GALLBLADDER SURGERY    . HERNIA REPAIR     Umbilical hernia repair as a child    Family History  Problem Relation Age of Onset  . Coronary artery disease Father   . Diabetes type II Father   . Coronary artery disease Mother   . Diabetes type II Mother     Allergies  Allergen Reactions  . Tylenol [Acetaminophen] Other (See Comments)    Funny feeling in chest.     Current Outpatient Prescriptions on File Prior to Visit  Medication Sig Dispense Refill  . aspirin EC 81 MG tablet Take 81 mg by mouth every morning.    . cloNIDine (CATAPRES) 0.2 MG tablet Take 0.2 mg by mouth 2 (two) times daily.    Marland Kitchen dicyclomine (BENTYL) 10 MG capsule Take 10 mg by mouth daily as needed for spasms.    Marland Kitchen HYDROcodone-acetaminophen (NORCO/VICODIN) 5-325 MG per tablet Take 1 tablet by mouth 3 (three) times daily.     Marland Kitchen LORazepam (ATIVAN) 1 MG tablet Take 1 mg by mouth every 8 (eight) hours as needed for anxiety or sleep. Anxiety    . simvastatin (ZOCOR) 20 MG tablet Take 20 mg by mouth every morning.     Marland Kitchen spironolactone (ALDACTONE) 25 MG tablet Take 25 mg by mouth daily.    . nitroGLYCERIN (NITROSTAT) 0.4 MG SL tablet Place 1 tablet (0.4 mg total) under the tongue every 5 (five) minutes as needed for chest pain. Chest Pain (Patient not taking: Reported on 10/29/2015) 60 tablet 3   No current facility-administered medications on file prior to visit.     BP 110/66   Pulse 60   Temp 98.1 F (36.7 C) (Oral)   Ht 5' (1.524 m)   Wt 133 lb (60.3 kg)   SpO2 96%   BMI 25.97 kg/m    Objective:   Physical Exam  Constitutional: She is oriented to person, place, and time. She appears well-nourished.  Neck: Neck supple.  Cardiovascular: Normal rate and regular  rhythm.   Pulmonary/Chest: Effort normal and breath sounds normal.  Musculoskeletal:  Overall good range of motion and able to move from chair to exam table without difficulty.  Neurological: She is alert and oriented to person, place, and time.  Skin: Skin is warm and dry.  Psychiatric: She has a normal mood and affect.          Assessment & Plan:

## 2015-10-29 NOTE — Assessment & Plan Note (Signed)
Long history of several years ago. Managed on dicyclomine when necessary. Discussed proper use of this medication today.

## 2015-10-29 NOTE — Progress Notes (Signed)
Pre visit review using our clinic review tool, if applicable. No additional management support is needed unless otherwise documented below in the visit note. 

## 2015-10-29 NOTE — Assessment & Plan Note (Signed)
Managed on omeprazole 20 mg once daily. Also using Tums intermittently for esophageal fullness with belching. Poor diet and does not exercise. Discussed proper diet and handout provided regarding trigger foods for esophageal reflux.  If no improvement in symptoms may need to increase him up is all this to 40 mg temporarily.

## 2015-10-29 NOTE — Assessment & Plan Note (Signed)
Sounds to be mixed incontinence but unsure as patient is a poor historian. Improved on Myrbetriq 25 mg.

## 2015-10-29 NOTE — Assessment & Plan Note (Signed)
Stable in the office today. Continue atenolol 100 mg, clonidine 0.2 mg, spironolactone 25 mg.

## 2015-10-29 NOTE — Assessment & Plan Note (Signed)
Managed on Norco per previous primary care provider for the past 10+ years. Strongly discouraged use of this medication given age and high risk for falls. Long discussion today regarding weaning off and using Tylenol or tramadol as needed. Patient will tried Tylenol and update if no improvement. Instructions provided regarding weaning off medication.

## 2015-10-29 NOTE — Assessment & Plan Note (Signed)
Managed on beta blocker and statin. History of MI 2. Encouraged her to follow with cardiology annually.

## 2015-10-29 NOTE — Assessment & Plan Note (Signed)
Managed on Zoloft 100 mg for the past 2 years since the death of her husband. Chronic benzodiazepine use with lorazepam. Long discussion today with patient and daughter regarding risks of long-term benzo use and strongly encouraged her to wean off. Provided her with the plan today to wean off of her current Lorazepam prescription. Recommended melatonin use for insomnia.  Will call patient in several weeks for an update.

## 2015-11-11 ENCOUNTER — Other Ambulatory Visit (HOSPITAL_COMMUNITY): Payer: Self-pay | Admitting: Registered Nurse

## 2015-11-11 DIAGNOSIS — M8580 Other specified disorders of bone density and structure, unspecified site: Secondary | ICD-10-CM

## 2015-11-12 ENCOUNTER — Other Ambulatory Visit: Payer: Self-pay | Admitting: Primary Care

## 2015-11-12 DIAGNOSIS — E785 Hyperlipidemia, unspecified: Secondary | ICD-10-CM

## 2015-11-12 DIAGNOSIS — I10 Essential (primary) hypertension: Secondary | ICD-10-CM

## 2015-11-12 DIAGNOSIS — F411 Generalized anxiety disorder: Secondary | ICD-10-CM

## 2015-11-12 NOTE — Telephone Encounter (Signed)
-----   Message from [name], NP sent at 10/30/2015  7:51 AM EDT ----- Regarding: Check Up Please check on patient. 1. Has she tried weaning off Lorazepam? 2. Has she tried Melatonin? 3. Has she tried Tylenol rather than Vicodin for pain? 4. How's she doing?

## 2015-11-13 NOTE — Telephone Encounter (Signed)
Left message with patient's daughter for patient to call back. 

## 2015-11-22 NOTE — Telephone Encounter (Signed)
Left a message for patient's daughter on 11/18/2015.   Per DPR left, message on [phone] for patient to call back.

## 2015-11-27 ENCOUNTER — Encounter: Payer: Self-pay | Admitting: Family Medicine

## 2015-11-27 ENCOUNTER — Ambulatory Visit (INDEPENDENT_AMBULATORY_CARE_PROVIDER_SITE_OTHER)
Admission: RE | Admit: 2015-11-27 | Discharge: 2015-11-27 | Disposition: A | Discharge status: Home / Self Care | Payer: Medicare Other | Source: Ambulatory Visit | Attending: Family Medicine | Admitting: Family Medicine

## 2015-11-27 ENCOUNTER — Ambulatory Visit
Admission: RE | Admit: 2015-11-27 | Discharge: 2015-11-27 | Disposition: A | Discharge status: Home / Self Care | Payer: Medicare Other | Source: Ambulatory Visit | Attending: Family Medicine | Admitting: Family Medicine

## 2015-11-27 ENCOUNTER — Ambulatory Visit (INDEPENDENT_AMBULATORY_CARE_PROVIDER_SITE_OTHER): Payer: Medicare Other | Admitting: Family Medicine

## 2015-11-27 VITALS — BP 140/72 | HR 63 | Temp 98.4°F | Ht 60.0 in | Wt 130.0 lb

## 2015-11-27 DIAGNOSIS — M7501 Adhesive capsulitis of right shoulder: Secondary | ICD-10-CM | POA: Diagnosis not present

## 2015-11-27 DIAGNOSIS — M25512 Pain in left shoulder: Secondary | ICD-10-CM

## 2015-11-27 DIAGNOSIS — M25511 Pain in right shoulder: Secondary | ICD-10-CM

## 2015-11-27 DIAGNOSIS — S4992XA Unspecified injury of left shoulder and upper arm, initial encounter: Secondary | ICD-10-CM | POA: Diagnosis not present

## 2015-11-27 DIAGNOSIS — M19019 Primary osteoarthritis, unspecified shoulder: Secondary | ICD-10-CM

## 2015-11-27 DIAGNOSIS — S4991XA Unspecified injury of right shoulder and upper arm, initial encounter: Secondary | ICD-10-CM | POA: Diagnosis not present

## 2015-11-27 MED ORDER — METHYLPREDNISOLONE ACETATE 40 MG/ML IJ SUSP
80.0000 mg | Freq: Once | INTRAMUSCULAR | Status: AC
  Administered 2015-11-27: 80 mg via INTRA_ARTICULAR

## 2015-11-27 NOTE — Progress Notes (Signed)
Dr. Frederico Hamman T. Melva Faux, MD, [facility] [place], [address] Phone: [phone] Fax: [phone]  11/27/2015  Patient: Cindy Brandt, MRN: [id number], DOB: 1931-03-20, 82 y.o.  Primary Physician:  Sheral Flow, NP   Chief Complaint  Patient presents with  . Shoulder Pain    Bilateral but right is worse and radiates down arm   Subjective:   Cindy Brandt is a 82 y.o. very pleasant female patient who presents with the following: shoulder pain  The patient noted above presents with shoulder pain that has been ongoing for worsening for 2 months. Cindy Brandt tells me that she has been complaining about her shoulder off and on for several years. Right now her right shoulder is much more symptomatic. there is no history of trauma or accident. The patient denies neck pain or radicular symptoms. No shoulder blade pain Denies dislocation, subluxation, separation of the shoulder. The patient does complain of pain with flexion, abduction, and terminal motion.  Significant restriction of motion. she describes a deep ache around the shoulder, and sometimes it will wake the patient up at night.  R shoulder is bothering her and has been for a while. Both of them hurt.  Cindy Brandt about 2 months ago. Now usually living with her daughter.   Caught on her hands about 2 months ago - but was having some pain before.  No h/o fracture or surgery.    Medications Tried: vicodin Ice or Heat: minimal help Tried PT: No  Prior shoulder Injury: No Prior surgery: No Prior fracture: No  Past Medical History, Surgical History, Social History, Family History, Medications, and allergies reviewed and updated if relevant.   Patient Active Problem List   Diagnosis Date Noted  . GERD (gastroesophageal reflux disease) 10/29/2015  . IBS (irritable bowel syndrome) 10/29/2015  . Urinary incontinence 10/29/2015  . Osteoarthritis 10/29/2015  .  Diastolic dysfunction [id number]  . Chest pain 11/23/2011  . HYPOKALEMIA 07/31/2009  . Hyperlipidemia 05/22/2009  . Anxiety and depression 05/22/2009  . Essential hypertension 05/22/2009  . Cardiovascular disease 05/22/2009    Past Medical History:  Diagnosis Date  . Anxiety   . Anxiety   . Arthritis   . Coronary atherosclerosis of native coronary artery    Occluded RCA (unsuccessful PCI 2003), nonobstructive left system 2005   . Diastolic dysfunction [id number]   Per echo. Grade 1. Ejection fraction 60-65%.; Akinesis of the basal inferior myocardium.  Marland Kitchen Headache(784.0)   . Hyperlipidemia   . Hypertension   . NSTEMI (non-ST elevated myocardial infarction) [facility])    Managed medically 2005    Past Surgical History:  Procedure Laterality Date  . ABDOMINAL HYSTERECTOMY    . CHOLECYSTECTOMY  2004  . COLONOSCOPY  08/06/2011   Procedure: COLONOSCOPY;  Surgeon: [name], MD;  Location[facility];  Service: Endoscopy;  Laterality: N/A;  10:40 AM  . GALLBLADDER SURGERY    . HERNIA REPAIR     Umbilical hernia repair as a child    Social History   Social History  . Marital status: Married    Spouse name: N/A  . Number of children: N/A  . Years of education: N/A   Occupational History  . Retired    Social History Main Topics  . Smoking status: Former Smoker    Packs/day: 0.50    Years: 25.00    Types: Cigarettes  . Smokeless tobacco: Never Used  . Alcohol use No  .  Drug use: No  . Sexual activity: Not on file   Other Topics Concern  . Not on file   Social History Narrative   No regular exercise    Family History  Problem Relation Age of Onset  . Coronary artery disease Father   . Diabetes type II Father   . Coronary artery disease Mother   . Diabetes type II Mother     Allergies  Allergen Reactions  . Tylenol [Acetaminophen] Other (See Comments)    Funny feeling in chest.     Medication list reviewed and updated in full in [place].  GEN: No fevers, chills. Nontoxic. Primarily MSK c/o today. MSK: Detailed in the HPI GI: tolerating PO intake without difficulty Neuro: No numbness, parasthesias, or tingling associated. Otherwise the pertinent positives of the ROS are noted above.    Objective:   Blood pressure 140/72, pulse 63, temperature 98.4 F (36.9 C), temperature source Oral, height 5' (1.524 m), weight 130 lb (59 kg).  GEN: Well-developed,well-nourished,in no acute distress; alert,appropriate and cooperative throughout examination HEENT: Normocephalic and atraumatic without obvious abnormalities. Ears, externally no deformities PULM: Breathing comfortably in no respiratory distress EXT: No clubbing, cyanosis, or edema PSYCH: Normally interactive. Cooperative during the interview. Pleasant. Friendly and conversant. Not anxious or depressed appearing. Normal, full affect.  Shoulder: b Inspection: No muscle wasting or winging Ecchymosis/edema: neg  AC joint, scapula, clavicle: NT Cervical spine: NT, full ROM Spurling's: neg Abduction: 5/5, 100 on R, 120 on L Flexion: 5/5, 110 on R, 135 on L IR, full, lift-off: 5/5, minimal ER at neutral: 5/5, 75 on R, 85 on R AC crossover and compression: unable to complete Additional special testing is equivocal given lack of motion C5-T1 intact Sensation intact Grip 5/5  Assessment and Plan:   Frozen shoulder, right  Right shoulder pain - Plan: DG Shoulder Right, methylPREDNISolone acetate (DEPO-MEDROL) injection 80 mg  Left shoulder pain - Plan: DG Shoulder Left  Glenohumeral arthritis, unspecified laterality  >25 minutes spent in face to face time with patient, >50% spent in counselling or coordination of care  Mild adhesive capsulitis along with glenohumeral arthritis in both shoulders. Is difficult to know with the primary driver is, but I suspect that glenohumeral arthritis and pain causes the patient have restricted motion and she developed a frozen  shoulder and the right. Interestingly, her glenohumeral joint appears to be in worse condition on the left.  Given range of motion exercises to do daily at home from [place].  Intrarticular Shoulder Injection< R Verbal consent was obtained from the patient. Risks including infection explained and contrasted with benefits and alternatives. Patient prepped with Chloraprep and Ethyl Chloride used for anesthesia. An intraarticular shoulder injection was performed using the posterior approach. The patient tolerated the procedure well and had decreased pain post injection. No complications. Injection: 8 cc of Lidocaine 1% and 2 mL Depo-Medrol 40 mg. Needle: 22 gauge   Follow-up: 2 mo  Orders Placed This Encounter  Procedures  . DG Shoulder Right  . DG Shoulder Left    Signed,  Frederico Hamman T. Bharath Bernstein, MD   Patient's Medications  New Prescriptions   No medications on file  Previous Medications   ALENDRONATE (FOSAMAX) 70 MG TABLET    Take 70 mg by mouth once a week.   ASPIRIN EC 81 MG TABLET    Take 81 mg by mouth every morning.   ATENOLOL (TENORMIN) 100 MG TABLET    Take 100 mg by mouth daily.  CLONIDINE (CATAPRES) 0.2 MG TABLET    Take 0.2 mg by mouth 2 (two) times daily.   DICYCLOMINE (BENTYL) 10 MG CAPSULE    Take 10 mg by mouth daily as needed for spasms.   HYDROCODONE-ACETAMINOPHEN (NORCO/VICODIN) 5-325 MG PER TABLET    Take 1 tablet by mouth 3 (three) times daily.    LORAZEPAM (ATIVAN) 1 MG TABLET    Take 1 mg by mouth every 8 (eight) hours as needed for anxiety or sleep. Anxiety   MYRBETRIQ 25 MG TB24 TABLET    Take 25 mg by mouth daily.   NITROGLYCERIN (NITROSTAT) 0.4 MG SL TABLET    Place 1 tablet (0.4 mg total) under the tongue every 5 (five) minutes as needed for chest pain. Chest Pain   OMEPRAZOLE (PRILOSEC) 20 MG CAPSULE    Take 20 mg by mouth daily.   SERTRALINE (ZOLOFT) 100 MG TABLET    TAKE 1 TABLET BY MOUTH EVERY DAY IN THE MORNING   SIMVASTATIN (ZOCOR) 20 MG TABLET    Take  20 mg by mouth every morning.    SPIRONOLACTONE (ALDACTONE) 25 MG TABLET    Take 25 mg by mouth daily.  Modified Medications   No medications on file  Discontinued Medications   No medications on file

## 2015-11-27 NOTE — Progress Notes (Signed)
Pre visit review using our clinic review tool, if applicable. No additional management support is needed unless otherwise documented below in the visit note. 

## 2015-11-28 ENCOUNTER — Other Ambulatory Visit (HOSPITAL_COMMUNITY): Payer: Medicare Other

## 2015-12-11 MED ORDER — SERTRALINE HCL 100 MG PO TABS: ORAL_TABLET | ORAL | 1 refills | Status: DC

## 2015-12-11 MED ORDER — SIMVASTATIN 20 MG PO TABS: 20.0000 mg | ORAL_TABLET | Freq: Every morning | ORAL | 1 refills | Status: DC

## 2015-12-11 MED ORDER — CLONIDINE HCL 0.2 MG PO TABS: 0.2000 mg | ORAL_TABLET | Freq: Two times a day (BID) | ORAL | 1 refills | Status: DC

## 2015-12-11 NOTE — Addendum Note (Signed)
Addended by: Jacqualin Combes on: 12/11/2015 12:11 PM   Modules accepted: Orders

## 2015-12-11 NOTE — Telephone Encounter (Signed)
Spoken to patient's daughter and can hear patient in the background.  1. Patient is taking 1/2 tablet of the Lorazepam. 2. No, she has not tried melatonin. 3. She is using Tylenol instead. 4. Patient stated that she is doing fine.  Also patient's daughter stated that patient need medication refill.

## 2016-02-17 ENCOUNTER — Encounter: Payer: Self-pay | Admitting: Primary Care

## 2016-02-17 ENCOUNTER — Ambulatory Visit (INDEPENDENT_AMBULATORY_CARE_PROVIDER_SITE_OTHER): Payer: Medicare Other | Admitting: Primary Care

## 2016-02-17 VITALS — BP 136/76 | HR 69 | Temp 97.8°F | Ht 60.0 in | Wt 128.8 lb

## 2016-02-17 DIAGNOSIS — Z23 Encounter for immunization: Secondary | ICD-10-CM | POA: Diagnosis not present

## 2016-02-17 DIAGNOSIS — K219 Gastro-esophageal reflux disease without esophagitis: Secondary | ICD-10-CM | POA: Diagnosis not present

## 2016-02-17 DIAGNOSIS — R1013 Epigastric pain: Secondary | ICD-10-CM | POA: Diagnosis not present

## 2016-02-17 MED ORDER — OMEPRAZOLE 40 MG PO CPDR: 40.0000 mg | DELAYED_RELEASE_CAPSULE | Freq: Every day | ORAL | 0 refills | Status: DC

## 2016-02-17 NOTE — Addendum Note (Signed)
Addended by: Jacqualin Combes on: 02/17/2016 02:15 PM   Modules accepted: Orders

## 2016-02-17 NOTE — Progress Notes (Signed)
Pre visit review using our clinic review tool, if applicable. No additional management support is needed unless otherwise documented below in the visit note. 

## 2016-02-17 NOTE — Progress Notes (Signed)
Subjective:    Patient ID: Cindy Brandt, female    DOB: May 08, 1931, 82 y.o.   MRN: [id number]  HPI  Cindy Brandt is an 82 year old female with a history of IBS, GERD, and myocardial infarction who presents today with a chief complaint of abdominal pain. She also reports middle anterior chest pain, esophageal burning nausea, throat fullness. She describes her pain as a fullness/pressure/burning. She's had a few episodes of vomiting. She is managed on dicyclomine for IBS and omeprazole for GERD. She's been taking Tums nearly everyday for her symptoms with temporary improvement. Her symptoms have been present for about 1 month. She denies diaphoresis, radiation of pain to extremities/ back/ lower abdomen, diarrhea, constipation, bloating. She's using her Dicyclomine sparingly.  Review of Systems  Constitutional: Negative for fatigue and fever.  Gastrointestinal: Positive for abdominal pain and nausea. Negative for constipation, diarrhea and vomiting.       Esophageal burning  Neurological: Negative for weakness.       Past Medical History:  Diagnosis Date  . Anxiety   . Anxiety   . Arthritis   . Coronary atherosclerosis of native coronary artery    Occluded RCA (unsuccessful PCI 2003), nonobstructive left system 2005   . Diastolic dysfunction [id number]   Per echo. Grade 1. Ejection fraction 60-65%.; Akinesis of the basal inferior myocardium.  Marland Kitchen Headache(784.0)   . Hyperlipidemia   . Hypertension   . NSTEMI (non-ST elevated myocardial infarction) [facility])    Managed medically 2005     Social History   Social History  . Marital status: Married    Spouse name: N/A  . Number of children: N/A  . Years of education: N/A   Occupational History  . Retired    Social History Main Topics  . Smoking status: Former Smoker    Packs/day: 0.50    Years: 25.00    Types: Cigarettes  . Smokeless tobacco: Never Used  . Alcohol use No  . Drug use: No  . Sexual activity: Not on file   Other  Topics Concern  . Not on file   Social History Narrative   No regular exercise    Past Surgical History:  Procedure Laterality Date  . ABDOMINAL HYSTERECTOMY    . CHOLECYSTECTOMY  2004  . COLONOSCOPY  08/06/2011   Procedure: COLONOSCOPY;  Surgeon: [name], MD;  Location[facility];  Service: Endoscopy;  Laterality: N/A;  10:40 AM  . GALLBLADDER SURGERY    . HERNIA REPAIR     Umbilical hernia repair as a child    Family History  Problem Relation Age of Onset  . Coronary artery disease Father   . Diabetes type II Father   . Coronary artery disease Mother   . Diabetes type II Mother     Allergies  Allergen Reactions  . Tylenol [Acetaminophen] Other (See Comments)    Funny feeling in chest.     Current Outpatient Prescriptions on File Prior to Visit  Medication Sig Dispense Refill  . alendronate (FOSAMAX) 70 MG tablet Take 70 mg by mouth once a week.  4  . aspirin EC 81 MG tablet Take 81 mg by mouth every morning.    Marland Kitchen atenolol (TENORMIN) 100 MG tablet Take 100 mg by mouth daily.  1  . cloNIDine (CATAPRES) 0.2 MG tablet Take 1 tablet (0.2 mg total) by mouth 2 (two) times daily. 180 tablet 1  . dicyclomine (BENTYL) 10 MG capsule Take 10 mg by mouth daily  as needed for spasms.    Marland Kitchen HYDROcodone-acetaminophen (NORCO/VICODIN) 5-325 MG per tablet Take 1 tablet by mouth 3 (three) times daily.     Marland Kitchen LORazepam (ATIVAN) 1 MG tablet Take 0.5 mg by mouth every other day. Anxiety    . MYRBETRIQ 25 MG TB24 tablet Take 25 mg by mouth daily.  11  . nitroGLYCERIN (NITROSTAT) 0.4 MG SL tablet Place 1 tablet (0.4 mg total) under the tongue every 5 (five) minutes as needed for chest pain. Chest Pain 60 tablet 3  . sertraline (ZOLOFT) 100 MG tablet TAKE 1 TABLET BY MOUTH EVERY DAY IN THE MORNING 90 tablet 1  . simvastatin (ZOCOR) 20 MG tablet Take 1 tablet (20 mg total) by mouth every morning. 90 tablet 1  . spironolactone (ALDACTONE) 25 MG tablet Take 25 mg by mouth daily.    .  [DISCONTINUED] omeprazole (PRILOSEC) 20 MG capsule Take 20 mg by mouth daily.  5   No current facility-administered medications on file prior to visit.     BP 136/76   Pulse 69   Temp 97.8 F (36.6 C) (Oral)   Ht 5' (1.524 m)   Wt 128 lb 12.8 oz (58.4 kg)   SpO2 98%   BMI 25.15 kg/m    Objective:   Physical Exam  Constitutional: She appears well-nourished.  Neck: Neck supple.  Cardiovascular: Normal rate and regular rhythm.   Pulmonary/Chest: Effort normal and breath sounds normal.  Abdominal: Soft. Bowel sounds are normal. There is no tenderness. There is no guarding.  Skin: Skin is warm and dry.          Assessment & Plan:

## 2016-02-17 NOTE — Patient Instructions (Addendum)
Your symptoms are related to acid reflux.   Take a look at the information below regarding trigger foods for acid reflux.  Remember not to lay down within 2 hours after eating as this will cause the acid reflux symptoms.   I've increased your omeprazole (Prilosec) 20 mg to 40 mg and have sent a new prescription to your pharmacy. Take 1 tablet by mouth every morning.  Use the Tums sparingly, as needed for acid reflux.  Please call me if no improvement in symptoms in 3 weeks. It was a pleasure to see you today!    Food Choices for Gastroesophageal Reflux Disease, Adult When you have gastroesophageal reflux disease (GERD), the foods you eat and your eating habits are very important. Choosing the right foods can help ease your discomfort. What guidelines do I need to follow?  Choose fruits, vegetables, whole grains, and low-fat dairy products.  Choose low-fat meat, fish, and poultry.  Limit fats such as oils, salad dressings, butter, nuts, and avocado.  Keep a food diary. This helps you identify foods that cause symptoms.  Avoid foods that cause symptoms. These may be different for everyone.  Eat small meals often instead of 3 large meals a day.  Eat your meals slowly, in a place where you are relaxed.  Limit fried foods.  Cook foods using methods other than frying.  Avoid drinking alcohol.  Avoid drinking large amounts of liquids with your meals.  Avoid bending over or lying down until 2-3 hours after eating. What foods are not recommended? These are some foods and drinks that may make your symptoms worse: Vegetables  Tomatoes. Tomato juice. Tomato and spaghetti sauce. Chili peppers. Onion and garlic. Horseradish. Fruits  Oranges, grapefruit, and lemon (fruit and juice). Meats  High-fat meats, fish, and poultry. This includes hot dogs, ribs, ham, sausage, salami, and bacon. Dairy  Whole milk and chocolate milk. Sour cream. Cream. Butter. Ice cream. Cream cheese. Drinks   Coffee and tea. Bubbly (carbonated) drinks or energy drinks. Condiments  Hot sauce. Barbecue sauce. Sweets/Desserts  Chocolate and cocoa. Donuts. Peppermint and spearmint. Fats and Oils  High-fat foods. This includes [place] fries and potato chips. Other  Vinegar. Strong spices. This includes black pepper, white pepper, red pepper, cayenne, curry powder, cloves, ginger, and chili powder. The items listed above may not be a complete list of foods and drinks to avoid. Contact your dietitian for more information.  This information is not intended to replace advice given to you by your health care provider. Make sure you discuss any questions you have with your health care provider. Document Released: 08/18/2011 Document Revised: 07/25/2015 Document Reviewed: 12/21/2012 Elsevier Interactive Patient Education  2017 [employer].

## 2016-02-17 NOTE — Assessment & Plan Note (Signed)
Suspect symptoms today related to uncontrolled GERD. ECG today stable and does not indicate acute MI. Increase Omeprazole to 40 mg daily x 3 months, then reduce down to 20 mg. Discussed to use Tum's sparingly and to notify if no improvement in 2-3 weeks. Also discussed triggers for GERD. Handout provided.

## 2016-03-16 ENCOUNTER — Telehealth: Payer: Self-pay | Admitting: *Deleted

## 2016-03-16 DIAGNOSIS — R1013 Epigastric pain: Secondary | ICD-10-CM

## 2016-03-16 DIAGNOSIS — I10 Essential (primary) hypertension: Secondary | ICD-10-CM

## 2016-03-16 MED ORDER — ATENOLOL 100 MG PO TABS: 100.0000 mg | ORAL_TABLET | Freq: Every day | ORAL | 1 refills | Status: DC

## 2016-03-16 NOTE — Telephone Encounter (Signed)
Our next step is an abdominal ultrasound. I've placed the order so someone in our office will be in contact soon. Atenolol refilled.

## 2016-03-16 NOTE — Telephone Encounter (Signed)
Pt's daughter left voicemail at Triage. pt was seen on 02/17/16 with GI issues/GERD, daughter said pt has the exact same sxs from last OV and they haven't improved at all. Daughter wants to know what the next step should be since she is still having same sxs

## 2016-03-16 NOTE — Telephone Encounter (Signed)
Where is her abdominal pain located?  Still at the same  esophageal burning.  Is she taking omeprazole 40 mg? Did she notice improvement at all? Yes, patient is taking the omeprazole and no, she did not notice any improvement.  Is she taking dicyclomine for IBS? Yes, she is taking it.  Any bloody stools?  No  Any fevers? No   Also patient's daughter stated that patient need a refill of atenolol (TENORMIN) 100 MG tablet. [facility] have not sent refill on this medication yet.

## 2016-03-16 NOTE — Telephone Encounter (Signed)
Where is her abdominal pain located? Is she taking omeprazole 40 mg? Did she notice improvement at all? Is she taking dicyclomine for IBS? Any bloody stools? Any fevers? Please answer all questions.

## 2016-03-17 NOTE — Telephone Encounter (Signed)
Spoken and notified patient's daughter of [name]'s comments. Patient's daughter verbalized understanding. 

## 2016-03-24 ENCOUNTER — Other Ambulatory Visit: Payer: Self-pay | Admitting: Primary Care

## 2016-03-24 NOTE — Addendum Note (Signed)
Addended by: Jacqualin Combes on: 03/24/2016 10:12 AM   Modules accepted: Orders

## 2016-03-27 ENCOUNTER — Ambulatory Visit
Admission: RE | Admit: 2016-03-27 | Discharge: 2016-03-27 | Disposition: A | Discharge status: Home / Self Care | Payer: Medicare Other | Source: Ambulatory Visit | Attending: Primary Care | Admitting: Primary Care

## 2016-03-27 ENCOUNTER — Other Ambulatory Visit: Payer: Self-pay | Admitting: Primary Care

## 2016-03-27 DIAGNOSIS — R1013 Epigastric pain: Secondary | ICD-10-CM

## 2016-03-27 DIAGNOSIS — N281 Cyst of kidney, acquired: Secondary | ICD-10-CM | POA: Diagnosis not present

## 2016-03-27 DIAGNOSIS — K219 Gastro-esophageal reflux disease without esophagitis: Secondary | ICD-10-CM

## 2016-04-06 ENCOUNTER — Encounter: Payer: Self-pay | Admitting: Gastroenterology

## 2016-04-06 ENCOUNTER — Ambulatory Visit (INDEPENDENT_AMBULATORY_CARE_PROVIDER_SITE_OTHER): Payer: Medicare Other | Admitting: Gastroenterology

## 2016-04-06 VITALS — BP 102/64 | HR 64 | Ht <= 58 in | Wt 125.0 lb

## 2016-04-06 DIAGNOSIS — Z86010 Personal history of colonic polyps: Secondary | ICD-10-CM

## 2016-04-06 DIAGNOSIS — K219 Gastro-esophageal reflux disease without esophagitis: Secondary | ICD-10-CM

## 2016-04-06 DIAGNOSIS — K589 Irritable bowel syndrome without diarrhea: Secondary | ICD-10-CM

## 2016-04-06 DIAGNOSIS — R079 Chest pain, unspecified: Secondary | ICD-10-CM | POA: Diagnosis not present

## 2016-04-06 DIAGNOSIS — Z860101 Personal history of adenomatous and serrated colon polyps: Secondary | ICD-10-CM | POA: Insufficient documentation

## 2016-04-06 NOTE — Patient Instructions (Signed)
You have been scheduled for an endoscopy. Please follow written instructions given to you at your visit today. If you use inhalers (even only as needed), please bring them with you on the day of your procedure. Your physician has requested that you go to www.startemmi.com and enter the access code given to you at your visit today. This web site gives a general overview about your procedure. However, you should still follow specific instructions given to you by our office regarding your preparation for the procedure.  Take ibuprofen 600 mg twice daily every day for chest pain x 2 weeks then as needed.   Thank you for choosing me and [facility].  Pricilla Riffle. Dagoberto Ligas., MD., Marval Regal

## 2016-04-06 NOTE — Progress Notes (Signed)
History of Present Illness: This is an 83 year old female referred by Cindy Brandt, Cindy Brandt for evaluation of abdominal pain and chest pain. Patient is accompanied by her daughter. Patient was followed by Cindy Brandt and underwent colonoscopy in 08/2011 showing 2 small sessile polyps, adenomatous and hyperplastic tissue on pathology. The pt and her daughter relate problems with abdominal pain and chest pain for several months. It is very difficult for the pt to further characterize or describe her symptoms-poor historian. With help of her daughter it appears that her abdominal pain has resolved with the use of dicyclomine twice daily. The patient notes occasional constipation. Her heartburn type chest pain has improved/resolved with Prilosec. The patient relates episodes of upper chest pain occurring intermittently, occasionally related to meals and occasionally helped with Tums. However frequently her chest pain is not helped by Tums and is unrelated to meals. Pain can occur at rest or with movement. She has a history of coronary artery disease with an NSTEMI in 2005. She has not had cardiology follow-up in several years. Denies weight loss, diarrhea, change in stool caliber, melena, hematochezia, nausea, vomiting, dysphagia.  Abd [place] 03/2016 IMPRESSION: Bilateral renal cysts.  No acute abnormality is noted.   Allergies  Allergen Reactions  . Tylenol [Acetaminophen] Other (See Comments)    Funny feeling in chest.    Outpatient Medications Prior to Visit  Medication Sig Dispense Refill  . alendronate (FOSAMAX) 70 MG tablet Take 70 mg by mouth once a week.  4  . aspirin EC 81 MG tablet Take 81 mg by mouth every morning.    Marland Kitchen atenolol (TENORMIN) 100 MG tablet Take 1 tablet (100 mg total) by mouth daily. 90 tablet 1  . cloNIDine (CATAPRES) 0.2 MG tablet Take 1 tablet (0.2 mg total) by mouth 2 (two) times daily. 180 tablet 1  . dicyclomine (BENTYL) 10 MG capsule Take 10 mg by mouth daily as needed  for spasms.    Marland Kitchen MYRBETRIQ 25 MG TB24 tablet Take 25 mg by mouth daily.  11  . omeprazole (PRILOSEC) 40 MG capsule Take 1 capsule (40 mg total) by mouth daily. 90 capsule 0  . sertraline (ZOLOFT) 100 MG tablet TAKE 1 TABLET BY MOUTH EVERY DAY IN THE MORNING 90 tablet 1  . simvastatin (ZOCOR) 20 MG tablet Take 1 tablet (20 mg total) by mouth every morning. 90 tablet 1  . spironolactone (ALDACTONE) 25 MG tablet Take 25 mg by mouth daily.    . nitroGLYCERIN (NITROSTAT) 0.4 MG SL tablet Place 1 tablet (0.4 mg total) under the tongue every 5 (five) minutes as needed for chest pain. Chest Pain (Patient not taking: Reported on 04/06/2016) 60 tablet 3  . HYDROcodone-acetaminophen (NORCO/VICODIN) 5-325 MG per tablet Take 1 tablet by mouth 3 (three) times daily.     Marland Kitchen LORazepam (ATIVAN) 1 MG tablet Take 0.5 mg by mouth every other day. Anxiety     No facility-administered medications prior to visit.    Past Medical History:  Diagnosis Date  . Anxiety   . Arthritis   . Coronary atherosclerosis of native coronary artery    Occluded RCA (unsuccessful PCI 2003), nonobstructive left system 2005   . Diastolic dysfunction [id number]   Per echo. Grade 1. Ejection fraction 60-65%.; Akinesis of the basal inferior myocardium.  Marland Kitchen Headache(784.0)   . Hyperlipidemia   . Hypertension   . NSTEMI (non-ST elevated myocardial infarction) ([place])    Managed medically 2005  . Tubular adenoma of colon 2013  Past Surgical History:  Procedure Laterality Date  . CARPAL TUNNEL RELEASE Bilateral   . CHOLECYSTECTOMY  2004  . COLONOSCOPY  08/06/2011   Procedure: COLONOSCOPY;  Surgeon: Cindy Binder, MD;  Location: [facility];  Service: Endoscopy;  Laterality: N/A;  10:40 AM  . GALLBLADDER SURGERY    . PARTIAL HYSTERECTOMY    . UMBILICAL HERNIA REPAIR     Umbilical hernia repair as a child   Social History   Social History  . Marital status: Married    Spouse name: N/A  . Number of children: 5  . Years of  education: N/A   Occupational History  . Retired    Social History Main Topics  . Smoking status: Former Smoker    Packs/day: 0.50    Years: 25.00    Types: Cigarettes    Quit date: 04/06/1996  . Smokeless tobacco: Never Used  . Alcohol use No  . Drug use: No  . Sexual activity: Not Asked   Other Topics Concern  . None   Social History Narrative   No regular exercise   Family History  Problem Relation Age of Onset  . Coronary artery disease Father   . Heart attack Father   . Colon cancer Father   . Coronary artery disease Mother   . Stomach cancer Mother   . Heart disease Brother   . Diabetes Brother    Review of Systems: Pertinent positive and negative review of systems were noted in the above HPI section. All other review of systems were otherwise negative.   Physical Exam: General: Well developed, well nourished, no acute distress Head: Normocephalic and atraumatic Eyes:  sclerae anicteric, EOMI Ears: Normal auditory acuity Mouth: No deformity or lesions Lungs: Clear throughout to auscultation Chest: Mild tenderness to palpation across upper chest Heart: Regular rate and rhythm; no murmurs, rubs or bruits Abdomen: Soft, non tender and non distended. No masses, hepatosplenomegaly or hernias noted. Normal Bowel sounds Musculoskeletal: Symmetrical with no gross deformities  Pulses:  Normal pulses noted Extremities: No clubbing, cyanosis, edema or deformities noted Neurological: Alert oriented x 4, grossly nonfocal Psychological:  Alert and cooperative. Normal mood and affect  Assessment and Recommendations:  1. Chest pain, intermittent, otherwise poorly characterized. These symptoms are not solely related to GERD although she may have GERD as one component of her chest pain. Possible musculoskeletal, cardiac, anxiety or other causes of chest pain. Continue omeprazole 40 mg daily and Tums when necessary. Begin ibuprofen 600 mg twice daily for 2 weeks for possible  musculoskeletal chest pain. Further evaluation with PCP and cardiology. Schedule EGD. The risks (including bleeding, perforation, infection, missed lesions, medication reactions and possible hospitalization or surgery if complications occur), benefits, and alternatives to endoscopy with possible biopsy and possible dilation were discussed with the patient and they consent to proceed.   2. Abdominal pain and mild constipation. Suspect IBS-C. increase dietary fiber and water intake. Continue Bentyl 10 mg twice daily. MiraLAX daily for management of constipation.  3. Personal history of adenomatous colon polyps. Due to her age she is no longer in an adenomatous polyp surveillance program.  4. Prior cholecystectomy  5. Prior umbilical hernia repair

## 2016-04-08 ENCOUNTER — Other Ambulatory Visit: Payer: Self-pay | Admitting: Primary Care

## 2016-04-08 DIAGNOSIS — K589 Irritable bowel syndrome without diarrhea: Secondary | ICD-10-CM

## 2016-04-08 NOTE — Telephone Encounter (Signed)
Ok to refill? Electronically refill request for dicyclomine (BENTYL) 10 MG capsule. Medication have not been prescribed by [facility]. Last seen on 02/17/2016.

## 2016-04-09 ENCOUNTER — Ambulatory Visit (AMBULATORY_SURGERY_CENTER): Payer: Medicare Other | Admitting: Gastroenterology

## 2016-04-09 ENCOUNTER — Encounter: Payer: Self-pay | Admitting: Gastroenterology

## 2016-04-09 VITALS — BP 110/60 | HR 58 | Temp 97.1°F | Resp 17 | Ht <= 58 in | Wt 125.0 lb

## 2016-04-09 DIAGNOSIS — K2970 Gastritis, unspecified, without bleeding: Secondary | ICD-10-CM | POA: Diagnosis not present

## 2016-04-09 DIAGNOSIS — R079 Chest pain, unspecified: Secondary | ICD-10-CM | POA: Diagnosis present

## 2016-04-09 DIAGNOSIS — K259 Gastric ulcer, unspecified as acute or chronic, without hemorrhage or perforation: Secondary | ICD-10-CM | POA: Diagnosis not present

## 2016-04-09 MED ORDER — SODIUM CHLORIDE 0.9 % IV SOLN: 500.0000 mL | INTRAVENOUS | Status: DC

## 2016-04-09 NOTE — Op Note (Signed)
[place] Patient Name: Cindy Brandt Procedure Date: 04/09/2016 1:23 PM MRN: [id number] Endoscopist: Ladene Artist , MD Age: 83 Referring MD:  Date of Birth: 21-Jun-1931 Gender: Female Account #: [id number] Procedure:                Upper GI endoscopy Indications:              Unexplained chest pain Medicines:                Monitored Anesthesia Care Procedure:                Pre-Anesthesia Assessment:                           - Prior to the procedure, a History and Physical                            was performed, and patient medications and                            allergies were reviewed. The patient's tolerance of                            previous anesthesia was also reviewed. The risks                            and benefits of the procedure and the sedation                            options and risks were discussed with the patient.                            All questions were answered, and informed consent                            was obtained. Prior Anticoagulants: The patient has                            taken no previous anticoagulant or antiplatelet                            agents. ASA Grade Assessment: II - A patient with                            mild systemic disease. After reviewing the risks                            and benefits, the patient was deemed in                            satisfactory condition to undergo the procedure.                           After obtaining informed consent, the endoscope was  passed under direct vision. Throughout the                            procedure, the patient's blood pressure, pulse, and                            oxygen saturations were monitored continuously. The                            Model GIF-[id number]) scope was introduced                            through the mouth, and advanced to the second part                            of duodenum. The upper GI  endoscopy was                            accomplished without difficulty. The patient                            tolerated the procedure well. Scope In: Scope Out: Findings:                 The examined esophagus was normal.                           Diffuse mild inflammation characterized by erythema                            and granularity was found in the gastric antrum.                            Biopsies were taken with a cold forceps for                            histology.                           The exam of the stomach was otherwise normal.                           The duodenal bulb and second portion of the                            duodenum were normal. Complications:            No immediate complications. Estimated Blood Loss:     Estimated blood loss was minimal. Impression:               - Normal esophagus.                           - Gastritis. Biopsied.                           - Normal duodenal bulb and second  portion of the                            duodenum. Recommendation:           - Patient has a contact number available for                            emergencies. The signs and symptoms of potential                            delayed complications were discussed with the                            patient. Return to normal activities tomorrow.                            Written discharge instructions were provided to the                            patient.                           - Resume previous diet.                           - Continue present medications.                           - Await pathology results.                           - No GI cause for chest pain found                           - Return to primary care physician at appointment                            to be scheduled. Ladene Artist, MD 04/09/2016 1:49:04 PM This report has been signed electronically.

## 2016-04-09 NOTE — Progress Notes (Signed)
Called to room to assist during endoscopic procedure.  Patient ID and intended procedure confirmed with present staff. Received instructions for my participation in the procedure from the performing physician.  

## 2016-04-09 NOTE — Progress Notes (Signed)
A and O x3. Report to RN. Tolerated MAC anesthesia well.Teeth unchanged after procedure.

## 2016-04-10 ENCOUNTER — Telehealth: Payer: Self-pay | Admitting: *Deleted

## 2016-04-10 NOTE — Telephone Encounter (Signed)
  Follow up Call-  Call back number 04/09/2016  Post procedure Call Back phone  # [phone] daughter's home - [place]  Permission to leave phone message Yes  Some recent data might be hidden     Patient questions:  Do you have a fever, pain , or abdominal swelling? No. Pain Score  0 *  Have you tolerated food without any problems? Yes.    Have you been able to return to your normal activities? Yes.    Do you have any questions about your discharge instructions: Diet   No. Medications  No. Follow up visit  No.  Do you have questions or concerns about your Care? No.  Actions: * If pain score is 4 or above: No action needed, pain <4.  Pt. Is still sleeping. Talked with her daughter. States she had pain yesterday but  Not anything new.  Same pain she had before coming.  Advised to await pathology results and call back prn.

## 2016-04-11 ENCOUNTER — Other Ambulatory Visit: Payer: Self-pay | Admitting: Primary Care

## 2016-04-11 DIAGNOSIS — I10 Essential (primary) hypertension: Secondary | ICD-10-CM

## 2016-04-13 NOTE — Telephone Encounter (Signed)
Ok to refill? Electronically refill request for cloNIDine (CATAPRES) 0.2 MG tablet. Last prescribed on 12/11/2015. Last seen on 02/17/2016.

## 2016-04-21 ENCOUNTER — Encounter: Payer: Self-pay | Admitting: Gastroenterology

## 2016-05-16 ENCOUNTER — Other Ambulatory Visit: Payer: Self-pay | Admitting: Primary Care

## 2016-05-16 DIAGNOSIS — R1013 Epigastric pain: Secondary | ICD-10-CM

## 2016-05-18 NOTE — Telephone Encounter (Signed)
Ok to refill? Electronically refill request for omeprazole (PRILOSEC) 40 MG capsule. Last prescribed and seen on 02/17/2016.

## 2016-05-18 NOTE — Telephone Encounter (Signed)
Refilled Rx. Saw GI in February 2018 who would like to continue omeprazole 40 mg.

## 2016-06-14 ENCOUNTER — Inpatient Hospital Stay (HOSPITAL_COMMUNITY): Payer: Medicare Other

## 2016-06-14 ENCOUNTER — Emergency Department (HOSPITAL_COMMUNITY): Payer: Medicare Other

## 2016-06-14 ENCOUNTER — Encounter (HOSPITAL_COMMUNITY): Payer: Self-pay | Admitting: *Deleted

## 2016-06-14 ENCOUNTER — Inpatient Hospital Stay (HOSPITAL_COMMUNITY)
Admission: EM | Admit: 2016-06-14 | Discharge: 2016-06-20 | DRG: 280 | Disposition: A | Discharge status: Home / Self Care | Payer: Medicare Other | Attending: Cardiology | Admitting: Cardiology

## 2016-06-14 DIAGNOSIS — I200 Unstable angina: Secondary | ICD-10-CM | POA: Diagnosis present

## 2016-06-14 DIAGNOSIS — F411 Generalized anxiety disorder: Secondary | ICD-10-CM | POA: Diagnosis present

## 2016-06-14 DIAGNOSIS — M8580 Other specified disorders of bone density and structure, unspecified site: Secondary | ICD-10-CM | POA: Diagnosis not present

## 2016-06-14 DIAGNOSIS — D649 Anemia, unspecified: Secondary | ICD-10-CM | POA: Diagnosis present

## 2016-06-14 DIAGNOSIS — Z79899 Other long term (current) drug therapy: Secondary | ICD-10-CM | POA: Diagnosis not present

## 2016-06-14 DIAGNOSIS — Z87891 Personal history of nicotine dependence: Secondary | ICD-10-CM

## 2016-06-14 DIAGNOSIS — Z884 Allergy status to anesthetic agent status: Secondary | ICD-10-CM

## 2016-06-14 DIAGNOSIS — Z90711 Acquired absence of uterus with remaining cervical stump: Secondary | ICD-10-CM

## 2016-06-14 DIAGNOSIS — I10 Essential (primary) hypertension: Secondary | ICD-10-CM

## 2016-06-14 DIAGNOSIS — R05 Cough: Secondary | ICD-10-CM | POA: Diagnosis not present

## 2016-06-14 DIAGNOSIS — Z9842 Cataract extraction status, left eye: Secondary | ICD-10-CM

## 2016-06-14 DIAGNOSIS — E876 Hypokalemia: Secondary | ICD-10-CM | POA: Diagnosis present

## 2016-06-14 DIAGNOSIS — I2583 Coronary atherosclerosis due to lipid rich plaque: Secondary | ICD-10-CM | POA: Diagnosis not present

## 2016-06-14 DIAGNOSIS — K2970 Gastritis, unspecified, without bleeding: Secondary | ICD-10-CM | POA: Diagnosis present

## 2016-06-14 DIAGNOSIS — I214 Non-ST elevation (NSTEMI) myocardial infarction: Principal | ICD-10-CM | POA: Diagnosis present

## 2016-06-14 DIAGNOSIS — R079 Chest pain, unspecified: Secondary | ICD-10-CM | POA: Diagnosis not present

## 2016-06-14 DIAGNOSIS — Z9861 Coronary angioplasty status: Secondary | ICD-10-CM

## 2016-06-14 DIAGNOSIS — I340 Nonrheumatic mitral (valve) insufficiency: Secondary | ICD-10-CM | POA: Diagnosis not present

## 2016-06-14 DIAGNOSIS — J180 Bronchopneumonia, unspecified organism: Secondary | ICD-10-CM | POA: Diagnosis not present

## 2016-06-14 DIAGNOSIS — Z7982 Long term (current) use of aspirin: Secondary | ICD-10-CM | POA: Diagnosis not present

## 2016-06-14 DIAGNOSIS — I700 Atherosclerosis of aorta: Secondary | ICD-10-CM | POA: Diagnosis present

## 2016-06-14 DIAGNOSIS — I252 Old myocardial infarction: Secondary | ICD-10-CM

## 2016-06-14 DIAGNOSIS — R072 Precordial pain: Secondary | ICD-10-CM | POA: Diagnosis not present

## 2016-06-14 DIAGNOSIS — R0600 Dyspnea, unspecified: Secondary | ICD-10-CM

## 2016-06-14 DIAGNOSIS — R0602 Shortness of breath: Secondary | ICD-10-CM

## 2016-06-14 DIAGNOSIS — M1990 Unspecified osteoarthritis, unspecified site: Secondary | ICD-10-CM | POA: Diagnosis not present

## 2016-06-14 DIAGNOSIS — K589 Irritable bowel syndrome without diarrhea: Secondary | ICD-10-CM | POA: Diagnosis present

## 2016-06-14 DIAGNOSIS — R0609 Other forms of dyspnea: Secondary | ICD-10-CM | POA: Diagnosis not present

## 2016-06-14 DIAGNOSIS — R011 Cardiac murmur, unspecified: Secondary | ICD-10-CM | POA: Diagnosis present

## 2016-06-14 DIAGNOSIS — Z9841 Cataract extraction status, right eye: Secondary | ICD-10-CM

## 2016-06-14 DIAGNOSIS — I952 Hypotension due to drugs: Secondary | ICD-10-CM | POA: Diagnosis not present

## 2016-06-14 DIAGNOSIS — I25110 Atherosclerotic heart disease of native coronary artery with unstable angina pectoris: Secondary | ICD-10-CM | POA: Diagnosis not present

## 2016-06-14 DIAGNOSIS — Z886 Allergy status to analgesic agent status: Secondary | ICD-10-CM

## 2016-06-14 DIAGNOSIS — K219 Gastro-esophageal reflux disease without esophagitis: Secondary | ICD-10-CM | POA: Diagnosis present

## 2016-06-14 DIAGNOSIS — E860 Dehydration: Secondary | ICD-10-CM | POA: Diagnosis not present

## 2016-06-14 DIAGNOSIS — N289 Disorder of kidney and ureter, unspecified: Secondary | ICD-10-CM | POA: Diagnosis not present

## 2016-06-14 DIAGNOSIS — Z9049 Acquired absence of other specified parts of digestive tract: Secondary | ICD-10-CM

## 2016-06-14 DIAGNOSIS — I119 Hypertensive heart disease without heart failure: Secondary | ICD-10-CM | POA: Diagnosis not present

## 2016-06-14 DIAGNOSIS — I452 Bifascicular block: Secondary | ICD-10-CM | POA: Diagnosis not present

## 2016-06-14 DIAGNOSIS — E785 Hyperlipidemia, unspecified: Secondary | ICD-10-CM | POA: Diagnosis not present

## 2016-06-14 DIAGNOSIS — Z8249 Family history of ischemic heart disease and other diseases of the circulatory system: Secondary | ICD-10-CM

## 2016-06-14 DIAGNOSIS — Z7983 Long term (current) use of bisphosphonates: Secondary | ICD-10-CM

## 2016-06-14 DIAGNOSIS — Z800 Family history of malignant neoplasm of digestive organs: Secondary | ICD-10-CM

## 2016-06-14 DIAGNOSIS — I2510 Atherosclerotic heart disease of native coronary artery without angina pectoris: Secondary | ICD-10-CM | POA: Diagnosis not present

## 2016-06-14 DIAGNOSIS — Z833 Family history of diabetes mellitus: Secondary | ICD-10-CM

## 2016-06-14 DIAGNOSIS — K319 Disease of stomach and duodenum, unspecified: Secondary | ICD-10-CM | POA: Diagnosis not present

## 2016-06-14 HISTORY — DX: Hypokalemia: E87.6

## 2016-06-14 HISTORY — DX: Gastritis, unspecified, without bleeding: K29.70

## 2016-06-14 LAB — CBC
HEMATOCRIT: 30.6 % — AB (ref 36.0–46.0)
HEMOGLOBIN: 10.8 g/dL — AB (ref 12.0–15.0)
MCH: 33.2 pg (ref 26.0–34.0)
MCHC: 35.3 g/dL (ref 30.0–36.0)
MCV: 94.2 fL (ref 78.0–100.0)
Platelets: 318 10*3/uL (ref 150–400)
RBC: 3.25 MIL/uL — AB (ref 3.87–5.11)
RDW: 11.9 % (ref 11.5–15.5)
WBC: 9.3 10*3/uL (ref 4.0–10.5)

## 2016-06-14 LAB — HEPARIN LEVEL (UNFRACTIONATED): HEPARIN UNFRACTIONATED: 0.2 [IU]/mL — AB (ref 0.30–0.70)

## 2016-06-14 LAB — I-STAT TROPONIN, ED: TROPONIN I, POC: 0.1 ng/mL — AB (ref 0.00–0.08)

## 2016-06-14 LAB — HEPATIC FUNCTION PANEL
ALBUMIN: 3 g/dL — AB (ref 3.5–5.0)
ALK PHOS: 39 U/L (ref 38–126)
ALT: 14 U/L (ref 14–54)
AST: 18 U/L (ref 15–41)
BILIRUBIN TOTAL: 0.5 mg/dL (ref 0.3–1.2)
Bilirubin, Direct: 0.1 mg/dL (ref 0.1–0.5)
Indirect Bilirubin: 0.4 mg/dL (ref 0.3–0.9)
TOTAL PROTEIN: 6.7 g/dL (ref 6.5–8.1)

## 2016-06-14 LAB — BASIC METABOLIC PANEL
ANION GAP: 8 (ref 5–15)
BUN: 19 mg/dL (ref 6–20)
CALCIUM: 8.6 mg/dL — AB (ref 8.9–10.3)
CO2: 25 mmol/L (ref 22–32)
Chloride: 108 mmol/L (ref 101–111)
Creatinine, Ser: 0.89 mg/dL (ref 0.44–1.00)
GFR calc non Af Amer: 59 mL/min — ABNORMAL LOW (ref >60)
GLUCOSE: 167 mg/dL — AB (ref 65–99)
Potassium: 2.9 mmol/L — ABNORMAL LOW (ref 3.5–5.1)
SODIUM: 141 mmol/L (ref 135–145)

## 2016-06-14 LAB — TROPONIN I
TROPONIN I: 0.64 ng/mL — AB (ref <0.03)
Troponin I: 0.55 ng/mL (ref <0.03)
Troponin I: 0.57 ng/mL (ref <0.03)

## 2016-06-14 LAB — PROTIME-INR
INR: 1.22
Prothrombin Time: 15.5 seconds — ABNORMAL HIGH (ref 11.4–15.2)

## 2016-06-14 LAB — APTT
aPTT: >200 seconds (ref 24–36)
aPTT: 61 seconds — ABNORMAL HIGH (ref 24–36)

## 2016-06-14 LAB — LIPASE, BLOOD: LIPASE: 18 U/L (ref 11–51)

## 2016-06-14 LAB — TSH: TSH: 1.229 u[IU]/mL (ref 0.350–4.500)

## 2016-06-14 MED ORDER — CLONIDINE HCL 0.2 MG PO TABS
0.2000 mg | ORAL_TABLET | Freq: Two times a day (BID) | ORAL | Status: DC
  Administered 2016-06-14 – 2016-06-17 (×6): 0.2 mg via ORAL
  Filled 2016-06-14 (×8): qty 1

## 2016-06-14 MED ORDER — SPIRONOLACTONE 25 MG PO TABS
25.0000 mg | ORAL_TABLET | Freq: Every day | ORAL | Status: DC
  Administered 2016-06-14 – 2016-06-18 (×5): 25 mg via ORAL
  Filled 2016-06-14 (×5): qty 1

## 2016-06-14 MED ORDER — AZITHROMYCIN 250 MG PO TABS
250.0000 mg | ORAL_TABLET | Freq: Every day | ORAL | Status: DC
  Administered 2016-06-15 – 2016-06-20 (×6): 250 mg via ORAL
  Filled 2016-06-14 (×6): qty 1

## 2016-06-14 MED ORDER — DICYCLOMINE HCL 10 MG PO CAPS
10.0000 mg | ORAL_CAPSULE | Freq: Two times a day (BID) | ORAL | Status: DC
  Administered 2016-06-14 – 2016-06-20 (×12): 10 mg via ORAL
  Filled 2016-06-14 (×12): qty 1

## 2016-06-14 MED ORDER — ATENOLOL 25 MG PO TABS
100.0000 mg | ORAL_TABLET | Freq: Every day | ORAL | Status: DC
  Administered 2016-06-14 – 2016-06-19 (×6): 100 mg via ORAL
  Filled 2016-06-14 (×6): qty 4

## 2016-06-14 MED ORDER — NITROGLYCERIN 0.4 MG SL SUBL
0.4000 mg | SUBLINGUAL_TABLET | SUBLINGUAL | Status: DC | PRN
  Administered 2016-06-15 – 2016-06-18 (×5): 0.4 mg via SUBLINGUAL
  Filled 2016-06-14 (×2): qty 1

## 2016-06-14 MED ORDER — HEPARIN BOLUS VIA INFUSION
3000.0000 [IU] | Freq: Once | INTRAVENOUS | Status: AC
  Administered 2016-06-14: 3000 [IU] via INTRAVENOUS

## 2016-06-14 MED ORDER — HEPARIN BOLUS VIA INFUSION
1000.0000 [IU] | Freq: Once | INTRAVENOUS | Status: AC
  Administered 2016-06-14: 1000 [IU] via INTRAVENOUS
  Filled 2016-06-14: qty 1000

## 2016-06-14 MED ORDER — HEPARIN (PORCINE) IN NACL 100-0.45 UNIT/ML-% IJ SOLN
650.0000 [IU]/h | INTRAMUSCULAR | Status: DC
  Administered 2016-06-14: 650 [IU]/h via INTRAVENOUS
  Filled 2016-06-14: qty 250

## 2016-06-14 MED ORDER — PANTOPRAZOLE SODIUM 40 MG PO TBEC
40.0000 mg | DELAYED_RELEASE_TABLET | Freq: Every day | ORAL | Status: DC
  Administered 2016-06-14 – 2016-06-20 (×7): 40 mg via ORAL
  Filled 2016-06-14 (×7): qty 1

## 2016-06-14 MED ORDER — ASPIRIN EC 81 MG PO TBEC
81.0000 mg | DELAYED_RELEASE_TABLET | Freq: Every morning | ORAL | Status: DC
  Administered 2016-06-14 – 2016-06-15 (×2): 81 mg via ORAL
  Filled 2016-06-14 (×2): qty 1

## 2016-06-14 MED ORDER — ATORVASTATIN CALCIUM 80 MG PO TABS
80.0000 mg | ORAL_TABLET | Freq: Every day | ORAL | Status: DC
  Administered 2016-06-14 – 2016-06-19 (×6): 80 mg via ORAL
  Filled 2016-06-14 (×6): qty 1

## 2016-06-14 MED ORDER — MIRABEGRON ER 25 MG PO TB24
25.0000 mg | ORAL_TABLET | Freq: Every day | ORAL | Status: DC
  Administered 2016-06-14 – 2016-06-20 (×7): 25 mg via ORAL
  Filled 2016-06-14 (×7): qty 1

## 2016-06-14 MED ORDER — POTASSIUM CHLORIDE CRYS ER 20 MEQ PO TBCR
40.0000 meq | EXTENDED_RELEASE_TABLET | Freq: Once | ORAL | Status: AC
  Administered 2016-06-14: 40 meq via ORAL
  Filled 2016-06-14: qty 2

## 2016-06-14 MED ORDER — ONDANSETRON HCL 4 MG/2ML IJ SOLN: 4.0000 mg | Freq: Four times a day (QID) | INTRAMUSCULAR | Status: DC | PRN

## 2016-06-14 MED ORDER — ASPIRIN 81 MG PO CHEW
324.0000 mg | CHEWABLE_TABLET | Freq: Once | ORAL | Status: AC
  Administered 2016-06-14: 324 mg via ORAL
  Filled 2016-06-14: qty 4

## 2016-06-14 MED ORDER — HEPARIN (PORCINE) IN NACL 100-0.45 UNIT/ML-% IJ SOLN
850.0000 [IU]/h | INTRAMUSCULAR | Status: DC
  Administered 2016-06-14: 650 [IU]/h via INTRAVENOUS
  Administered 2016-06-15: 850 [IU]/h via INTRAVENOUS
  Filled 2016-06-14: qty 250

## 2016-06-14 MED ORDER — SERTRALINE HCL 100 MG PO TABS
100.0000 mg | ORAL_TABLET | Freq: Every day | ORAL | Status: DC
  Administered 2016-06-14 – 2016-06-20 (×7): 100 mg via ORAL
  Filled 2016-06-14 (×7): qty 1

## 2016-06-14 MED ORDER — IBUPROFEN 200 MG PO TABS
200.0000 mg | ORAL_TABLET | Freq: Four times a day (QID) | ORAL | Status: DC | PRN
  Administered 2016-06-15 – 2016-06-16 (×3): 200 mg via ORAL
  Filled 2016-06-14 (×4): qty 1

## 2016-06-14 MED ORDER — AZITHROMYCIN 500 MG PO TABS
500.0000 mg | ORAL_TABLET | Freq: Once | ORAL | Status: AC
  Administered 2016-06-14: 500 mg via ORAL
  Filled 2016-06-14: qty 1

## 2016-06-14 NOTE — ED Notes (Signed)
Repeat ekg performed and given to Dr Wyvonnia Dusky per request,

## 2016-06-14 NOTE — Progress Notes (Addendum)
ANTICOAGULATION CONSULT NOTE - Follow Up Consult  Pharmacy Consult for Heparin  Indication: chest pain/ACS  Allergies  Allergen Reactions  . Tylenol [Acetaminophen] Other (See Comments)    Funny feeling in chest.     Patient Measurements: Height: 5' (152.4 cm) Weight: 120 lb (54.4 kg) IBW/kg (Calculated) : 45.5  Vital Signs: Temp: 98.6 F (37 C) (04/15 2020) Temp Source: Oral (04/15 2020) BP: 144/64 (04/15 2020) Pulse Rate: 64 (04/15 2020)  Labs:  Recent Labs  06/14/16 0410 06/14/16 0457 06/14/16 0942 06/14/16 1212 06/14/16 1442 06/14/16 2212  HGB 10.8*  --   --   --   --   --   HCT 30.6*  --   --   --   --   --   PLT 318  --   --   --   --   --   APTT  --  >200* 61*  --   --   --   LABPROT  --  15.5*  --   --   --   --   INR  --  1.22  --   --   --   --   HEPARINUNFRC  --   --   --  0.20*  --  <0.10*  CREATININE 0.89  --   --   --   --   --   TROPONINI  --   --  0.64*  --  0.55* 0.57*    Estimated Creatinine Clearance: 35 mL/min (by C-G formula based on SCr of 0.89 mg/dL).  Assessment: Undetectable heparin level due to heparin being off for unknown amount of time, re-started and running ok now  Goal of Therapy:  Heparin level 0.3-0.7 units/ml Monitor platelets by anticoagulation protocol: Yes   Plan:  -Cont heparin at 750 units/hr given unknown off time -F/U heparin level with AM labs  Narda Bonds 06/14/2016,11:38 PM   ============================== Addendum 4:21 AM Repeat HL this AM still undetectable, still running ok per RN -Heparin 1000 units BOLUS -Inc heparin drip to 850 units/hr -1300 HL Narda Bonds, PharmD, BCPS Clinical Pharmacist Phone: [phone] ===============================

## 2016-06-14 NOTE — Progress Notes (Addendum)
ANTICOAGULATION CONSULT NOTE - Preliminary  Pharmacy Consult for heparin Indication: chest pain/ACS  Allergies  Allergen Reactions  . Tylenol [Acetaminophen] Other (See Comments)    Funny feeling in chest.     Patient Measurements: Height: 5' (152.4 cm) Weight: 120 lb (54.4 kg) IBW/kg (Calculated) : 45.5 HEPARIN DW (KG): 54.4   Vital Signs: Temp: 99.4 F (37.4 C) (04/15 0406) Temp Source: Oral (04/15 0406) BP: 151/84 (04/15 0406) Pulse Rate: 65 (04/15 0406)  Labs:  Recent Labs  06/14/16 0410  HGB 10.8*  HCT 30.6*  PLT 318   CrCl cannot be calculated (Patient's most recent lab result is older than the maximum 21 days allowed.).  Medical History: Past Medical History:  Diagnosis Date  . Allergy   . Anemia   . Anxiety   . Arthritis   . Blood transfusion without reported diagnosis   . Cataract    bilerteral cataracts removed  . Coronary atherosclerosis of native coronary artery    Occluded RCA (unsuccessful PCI 2003), nonobstructive left system 2005   . Diastolic dysfunction 6/38/4665   Per echo. Grade 1. Ejection fraction 60-65%.; Akinesis of the basal inferior myocardium.  Marland Kitchen Headache(784.0)   . Hyperlipidemia   . Hypertension   . NSTEMI (non-ST elevated myocardial infarction) ([place])    Managed medically 2005  . Tubular adenoma of colon 2013    Medications:   (Not in a hospital admission) Scheduled:  . aspirin  324 mg Oral Once  . heparin  3,000 Units Intravenous Once   Infusions:  . heparin     PRN:  Anti-infectives    None      Assessment: 83 yo female c/o chest pain. Elevated troponin, starting heparin. Labs drawn, pending. No bruising/bleeding, no anticoag at home. May transfer to [facility].   Goal of Therapy:  Heparin level 0.3-0.7 units/ml   Plan:  Give 3000 units bolus x 1 Start heparin infusion at 650 units/hr Check anti-Xa level in 8 hours and daily while on heparin Continue to monitor H&H and platelets Preliminary review of pertinent  patient information completed.  Forestine Na clinical pharmacist will complete review during morning rounds to assess the patient and finalize treatment regimen.  Nyra Capes, RPH 06/14/2016,4:35 AM

## 2016-06-14 NOTE — H&P (Signed)
History & Physical    Patient ID: Cindy Brandt MRN: [id number], DOB/AGE: May 22, 1931   Admit date: 06/14/2016   Primary Physician: Sheral Flow, NP Primary Cardiologist: Zandra Abts, MD   Patient Profile    83 y/o ? with a h/o CAD (CTO of RCA & nonobs LAD/LCX dzs - 2005), HTN, HL, Diast Dysfxn, hypokalemia, and gastritis (EGD 04/2016), who presents on transfer from [facility] for admission after presentation w/ nitrate responsive chest pain and mild troponin elevation.  Past Medical History    Past Medical History:  Diagnosis Date  . Allergy   . Anemia   . Anxiety   . Arthritis   . Blood transfusion without reported diagnosis   . Cataract    a. bilerteral cataracts removed  . Coronary atherosclerosis of native coronary artery    a. 2003 Cath/unsuccessful PCI of occluded RCA;  b. 2005 NSTEMI/Cath: CTO RCA w/ L->R collats, LAD 20, LCX 40m, nl EF;  c. 11/2011 Myoview: no ischemia.  . Diastolic dysfunction 03/10/3233   a. 11/2011 Echo: Ejection fraction 60-65%, gr1 DD, basal inferior AK;  b. 09/2013 Echo: EF 55-60%, mild LVH, no rwma, Gr1 DD, mildly dil LA.  . Gastritis    a. 04/2016 EGD: Nl esophagus, gastritis, nl duodenal bulb & 2nd portion of the duodenum.  Marland Kitchen Headache(784.0)   . Hyperlipidemia   . Hypertension   . Hypokalemia   . Tubular adenoma of colon 2013    Past Surgical History:  Procedure Laterality Date  . CARPAL TUNNEL RELEASE Bilateral   . CHOLECYSTECTOMY  2004  . COLONOSCOPY  08/06/2011   Procedure: COLONOSCOPY;  Surgeon: [name], MD;  Location: [facility] SUITE;  Service: Endoscopy;  Laterality: N/A;  10:40 AM  . GALLBLADDER SURGERY    . PARTIAL HYSTERECTOMY    . repair of belly button    . UMBILICAL HERNIA REPAIR     Umbilical hernia repair as a child     Allergies  Allergies  Allergen Reactions  . Tylenol [Acetaminophen] Other (See Comments)    Funny feeling in chest.     History of Present Illness    83 y/o ? with the above PMH including CAD  s/p attempted PCI of an occluded RCA in 2003 with subsequent NSTEMI in 2005.  Cath @ that time showed a CTO of the RCA and nonobstructive LAD and LCX dzs.  Last myoview in 2013 was nonischemic.  She was seen in consultation for chest pain @ [facility] in 09/2013.  Echo showed nl EF @ the time and CE were negative.  Med Rx was recommended.  She has not been seen by cardiology since.  Other hx includes HTN, HL, and diast dysfxn.  Earlier this year, she was experiencing vague abd and chest discomfort.  She was seen by GI in Feb and underwent EGD that showed gastritis.  Path showed reactive gastropathy w/ focal erosion.  No metaplasia.  She has since had some improvement in GI Ss with PPI and bentyl therapy.  About 3 wks ago, she began having 'cold-like' symptoms and productive cough.  No fevers/chills.  As a result of coughing, she has also had some pleuritic/msk chest discomfort.  Cough has been improving.  On the evening of 4/14, she was lying in bed @ ~ 11:30 pm, and began to experience severe, retrosternal chest discomfort associated with dyspnea and diaphoresis.  This was different from the pain that she had experienced with cough.  She initially took a tums w/o relief  and after about 2 hrs of ongoing Ss, she took a ntg with complete relief in about 5 mins.  She was able to fall asleep for about 20 mins but then awoke with recurrent chest pain, which was again relieved after taking a ntg.  At that point, she asked her grand-daughter to take her to the [facility] ED.  There, ECG showed LBBB (present on 01/2016 ECG as well).  POC troponin was mildly elevated @ 0.10.  K 2.9.  Renal fxn nl.  Mildly anemic (10.8/30.6).  Chest x-ray shows left lung opacity with question of developing consolidation/pneumonia. She was placed on heparin and transferred to [facility] for cardiology evaluation. She has not had any further chest discomfort.  Home Medications    Prior to Admission medications   Medication Sig Start Date End  Date Taking? Authorizing Provider  alendronate (FOSAMAX) 70 MG tablet Take 70 mg by mouth once a week. 10/11/15   Historical Provider, MD  aspirin EC 81 MG tablet Take 81 mg by mouth every morning.    Historical Provider, MD  atenolol (TENORMIN) 100 MG tablet Take 1 tablet (100 mg total) by mouth daily. 03/16/16   [name], NP  cloNIDine (CATAPRES) 0.2 MG tablet TAKE 1 TABLET (0.2 MG TOTAL) BY MOUTH 2 (TWO) TIMES DAILY. 04/13/16   [name], NP  dicyclomine (BENTYL) 10 MG capsule TAKE ONE CAPSULE BY MOUTH TWICE A DAY 04/09/16   [name], NP  MYRBETRIQ 25 MG TB24 tablet Take 25 mg by mouth daily. 10/22/15   Historical Provider, MD  nitroGLYCERIN (NITROSTAT) 0.4 MG SL tablet Place 1 tablet (0.4 mg total) under the tongue every 5 (five) minutes as needed for chest pain. Chest Pain Patient not taking: Reported on 04/06/2016 10/30/13   Nishant Dhungel, MD  omeprazole (PRILOSEC) 40 MG capsule TAKE 1 CAPSULE (40 MG TOTAL) BY MOUTH DAILY. 05/18/16   [name], NP  sertraline (ZOLOFT) 100 MG tablet TAKE 1 TABLET BY MOUTH EVERY DAY IN THE MORNING 12/11/15   [name], NP  simvastatin (ZOCOR) 20 MG tablet Take 1 tablet (20 mg total) by mouth every morning. 12/11/15   [name], NP  spironolactone (ALDACTONE) 25 MG tablet Take 25 mg by mouth daily. 06/26/13   Historical Provider, MD    Family History    Family History  Problem Relation Age of Onset  . Coronary artery disease Father   . Heart attack Father   . Colon cancer Father   . Coronary artery disease Mother   . Stomach cancer Mother   . Heart disease Brother   . Diabetes Brother   . Pancreatic cancer Neg Hx   . Rectal cancer Neg Hx   . Esophageal cancer Neg Hx     Social History    Social History   Social History  . Marital status: Married    Spouse name: N/A  . Number of children: 5  . Years of education: N/A   Occupational History  . Retired    Social History Main Topics  . Smoking  status: Former Smoker    Packs/day: 0.50    Years: 25.00    Types: Cigarettes    Quit date: 04/06/1996  . Smokeless tobacco: Never Used  . Alcohol use No  . Drug use: No  . Sexual activity: Not on file   Other Topics Concern  . Not on file   Social History Narrative   Currently living with dtr in  [place].  No regular exercise.     Review of Systems    General:  No chills, fever, night sweats or weight changes.  Cardiovascular:    Positive chest pain associated with dyspnea and diaphoresis, no dyspnea on exertion, edema, orthopnea, palpitations, paroxysmal nocturnal dyspnea. Dermatological: No rash, lesions/masses Respiratory:  Positive cough with pleuritic chest pain, positive  dyspnea in the setting of chest pain prior to admission.  Urologic: No hematuria, dysuria Abdominal:    she has had some abdominal discomfort for several months, saying that her abdomen is mildly sore.  No nausea, vomiting, diarrhea, bright red blood per rectum, melena, or hematemesis Neurologic:  No visual changes, wkns, changes in mental status. All other systems reviewed and are otherwise negative except as noted above.  Physical Exam    Blood pressure 132/76, pulse 63, temperature 98.7 F (37.1 C), temperature source Oral, resp. rate 16, height 5' (1.524 m), weight 120 lb (54.4 kg), SpO2 98 %.  General: Pleasant, NAD Psych: Normal affect. Neuro: Alert and oriented X 3. Moves all extremities spontaneously. HEENT: Normal  Neck: Supple without bruits or JVD. Lungs:  Resp regular and unlaDiminished breath sounds at bilateral bases.  Heart: RRR no s3, s4, 2/6 systolic murmur at the left lower sternal border. Adomen: Soft, nontender, non-distended, BS + x 4.  Extremities: No clubbing, cyanosis or edema. DP/PT/Radials 2+ and equal bilaterally.  Labs    Troponin [facility] Test)  Recent Labs  06/14/16 0414  TROPIPOC 0.10*   Lab Results  Component Value Date   WBC 9.3 06/14/2016   HGB 10.8 (L)  06/14/2016   HCT 30.6 (L) 06/14/2016   MCV 94.2 06/14/2016   PLT 318 06/14/2016    Recent Labs Lab 06/14/16 0410 06/14/16 0457  NA 141  --   K 2.9*  --   CL 108  --   CO2 25  --   BUN 19  --   CREATININE 0.89  --   CALCIUM 8.6*  --   PROT  --  6.7  BILITOT  --  0.5  ALKPHOS  --  39  ALT  --  14  AST  --  18  GLUCOSE 167*  --    Lab Results  Component Value Date   CHOL 134 10/29/2013   HDL 49 10/29/2013   LDLCALC 71 10/29/2013   TRIG 70 10/29/2013    Radiology Studies    Dg Chest Port 1 View  Result Date: 06/14/2016 CLINICAL DATA:  Shortness of breath EXAM: PORTABLE CHEST 1 VIEW COMPARISON:  Chest radiograph 10/28/2013 FINDINGS: Unchanged cardiomediastinal contours with atherosclerotic calcification in the aortic arch. There are new opacities in the left mid lung. Medial right basilar opacities are unchanged. IMPRESSION: Near opacities in the left mid lung may indicate developing consolidation, including the possibility of pneumonia. Electronically Signed   By: Ulyses Jarred M.D.   On: 06/14/2016 04:58    ECG & Cardiac Imaging    RSR, 67, LBBB (LBBB first noted on ECG in 01/2016. Prev noted to have RBBB on ECG 10/28/2013).  Assessment & Plan    1. Unstable angina/coronary artery disease: Patient presented [facility] in the early morning hours of April 15 with chest discomfort associated with dyspnea and diaphoresis and relieved by nitroglycerin. Initial episode lasted 2 hours while subsequent episode lasted just a few minutes. She does have a prior history of coronary artery disease with known chronic total occlusion of the right coronary artery and moderate nonobstructive  LAD and circumflex disease. Her last stress test was in 2013 and was nonischemic. In the setting of chest pain overnight, she did have mild troponin elevation by point of care, at 0.10. She is currently on heparin and chest pain-free. ECG shows left bundle Shafer Swamy block which was also present in  December 2017, but interestingly in August 2015, ECG showed right bundle Zyanya Glaza block. We will admit and cycle cardiac markers. Continue heparin, aspirin, beta blocker, and statin. If troponin trend returns flat or normal, would likely pursue noninvasive testing while if she rules in, would plan on diagnostic catheterization on Monday. Of note, she has had a cough for 3 weeks with pleuritic chest pain, which was different than what she experienced last night. Chest x-ray raises question of possible left-sided pneumonia. Follow-up PA and lateral chest x-ray and add antibiotics if appropriate.  2. Essential hypertension: Blood pressure is currently stable. Continue beta blocker, clonidine, and spironolactone.  3. Hyperlipidemia: Last lipids we have on file was from August 2015, at which time LDL was 71. Continue statin therapy. Follow-up lipids and LFTs.  4. Cough/? Pneumonia: Patient has had cough for 3 weeks with some pleuritic and musculoskeletal chest pain. She has not had fevers or chills. Family says she has not been doing very much. Chest x-ray raises concern for left-sided consolidation and pneumonia. She is not currently febrile. White blood cell count is at the high end of normal. Follow-up PA and lateral chest x-ray and add antibiotics if appropriate.  5. Normocytic anemia: Follow. Recent EGD showing gastritis without evidence for bleeding.  6. Gastritis: Stable on PPI therapy.  7. Hypokalemia: Continue spironolactone. Supplement.  Signed, Murray Hodgkins, NP 06/14/2016, 8:32 AM  Attending Note  Patient seen and discussed with PA Sharolyn Douglas, I agree with his documentation. 83 yo female history of CAD as described above, HTN, anxiety, HL admitted with chest pain and elevated troponin.   K 2.9, Cr 0.89 Hgb 10.8 Plt 318  Trop 0.10--> CXR left midlung opacity.  11/2011 nuclear stress: no ischemia 09/2013 echo: LVEF 55-60%, no WMAs, grade I diastolic dysfunction EKG SR, LBBB. Seen in prior  EKG 01/2016  Patient presents with chest pain, mild troponin elevation. EKG with chronic LBBB compared to 01/2016 EKG. She is early on in her workup. Follow enzyme trend, pending trend and symptoms may require invasive vs noninvasive ischemic testing. Continue medical therapy with ASA, atenolol, atorva 80, hep gtt, aldactone. I am unclear on her ACE-I history at this time. Repeat echo. 2-3 weeks history of productive cough. PCXR possible pneumonia, she is going for PA/Lateral film. At minimum suspect bacterial bronchitis, will start azithromycin, if pneumonia confirmed will need to broaden coverage.    Zandra Abts MD

## 2016-06-14 NOTE — ED Provider Notes (Signed)
[facility] DEPT Provider Note   CSN: [id number] Arrival date & time: 06/14/16  0356     History   Chief Complaint Chief Complaint  Patient presents with  . Chest Pain    HPI Cindy Brandt is a 83 y.o. female.  Patient presents with central chest pain that woke her from sleep. Initially felt like her "gas" and she took a Tums and some acid reducer with no effect. After about one hour her granddaughter gave her nitroglycerin. This improved the patient was able to go back to sleep. She then woke up again with chest pain and Another Nitroglycerin after about 15 Minutes and Is Now Pain-Free. Patient Reports This Initially Felt like Her Gas but Granddaughter Reports She Was Short of Breath, Nauseated and Diaphoretic. Patient States She Feels Well Now. Pain Was in the Center of Her Chest and Did Not Radiate. No Abdominal Pain. No Cough or Fever. Patient with History of MI in 2005.   The history is provided by the patient.    Past Medical History:  Diagnosis Date  . Allergy   . Anemia   . Anxiety   . Arthritis   . Blood transfusion without reported diagnosis   . Cataract    bilerteral cataracts removed  . Coronary atherosclerosis of native coronary artery    Occluded RCA (unsuccessful PCI 2003), nonobstructive left system 2005   . Diastolic dysfunction 9/38/1017   Per echo. Grade 1. Ejection fraction 60-65%.; Akinesis of the basal inferior myocardium.  Marland Kitchen Headache(784.0)   . Hyperlipidemia   . Hypertension   . NSTEMI (non-ST elevated myocardial infarction) ([place])    Managed medically 2005  . Tubular adenoma of colon 2013    Patient Active Problem List   Diagnosis Date Noted  . Hx of adenomatous colonic polyps 04/06/2016  . GERD (gastroesophageal reflux disease) 10/29/2015  . IBS (irritable bowel syndrome) 10/29/2015  . Urinary incontinence 10/29/2015  . Osteoarthritis 10/29/2015  . Diastolic dysfunction 51/04/5850  . Chest pain 11/23/2011  . HYPOKALEMIA 07/31/2009  .  Hyperlipidemia 05/22/2009  . Anxiety and depression 05/22/2009  . Essential hypertension 05/22/2009  . Cardiovascular disease 05/22/2009    Past Surgical History:  Procedure Laterality Date  . CARPAL TUNNEL RELEASE Bilateral   . CHOLECYSTECTOMY  2004  . COLONOSCOPY  08/06/2011   Procedure: COLONOSCOPY;  Surgeon: [name], MD;  Location: [facility]O SUITE;  Service: Endoscopy;  Laterality: N/A;  10:40 AM  . GALLBLADDER SURGERY    . PARTIAL HYSTERECTOMY    . repair of belly button    . UMBILICAL HERNIA REPAIR     Umbilical hernia repair as a child    OB History    No data available       Home Medications    Prior to Admission medications   Medication Sig Start Date End Date Taking? Authorizing Provider  alendronate (FOSAMAX) 70 MG tablet Take 70 mg by mouth once a week. 10/11/15   Historical Provider, MD  aspirin EC 81 MG tablet Take 81 mg by mouth every morning.    Historical Provider, MD  atenolol (TENORMIN) 100 MG tablet Take 1 tablet (100 mg total) by mouth daily. 03/16/16   [name], NP  cloNIDine (CATAPRES) 0.2 MG tablet TAKE 1 TABLET (0.2 MG TOTAL) BY MOUTH 2 (TWO) TIMES DAILY. 04/13/16   [name], NP  dicyclomine (BENTYL) 10 MG capsule TAKE ONE CAPSULE BY MOUTH TWICE A DAY 04/09/16   [name], NP  [facility]  25 MG TB24 tablet Take 25 mg by mouth daily. 10/22/15   Historical Provider, MD  nitroGLYCERIN (NITROSTAT) 0.4 MG SL tablet Place 1 tablet (0.4 mg total) under the tongue every 5 (five) minutes as needed for chest pain. Chest Pain Patient not taking: Reported on 04/06/2016 10/30/13   Nishant Dhungel, MD  omeprazole (PRILOSEC) 40 MG capsule TAKE 1 CAPSULE (40 MG TOTAL) BY MOUTH DAILY. 05/18/16   [name], NP  sertraline (ZOLOFT) 100 MG tablet TAKE 1 TABLET BY MOUTH EVERY DAY IN THE MORNING 12/11/15   [name], NP  simvastatin (ZOCOR) 20 MG tablet Take 1 tablet (20 mg total) by mouth every morning. 12/11/15   [name], NP    spironolactone (ALDACTONE) 25 MG tablet Take 25 mg by mouth daily. 06/26/13   Historical Provider, MD    Family History Family History  Problem Relation Age of Onset  . Coronary artery disease Father   . Heart attack Father   . Colon cancer Father   . Coronary artery disease Mother   . Stomach cancer Mother   . Heart disease Brother   . Diabetes Brother   . Pancreatic cancer Neg Hx   . Rectal cancer Neg Hx   . Esophageal cancer Neg Hx     Social History Social History  Substance Use Topics  . Smoking status: Former Smoker    Packs/day: 0.50    Years: 25.00    Types: Cigarettes    Quit date: 04/06/1996  . Smokeless tobacco: Never Used  . Alcohol use No     Allergies   Tylenol [acetaminophen]   Review of Systems Review of Systems  Constitutional: Negative for activity change, appetite change and fever.  HENT: Negative for congestion and rhinorrhea.   Respiratory: Positive for chest tightness and shortness of breath.   Cardiovascular: Positive for chest pain.  Gastrointestinal: Positive for nausea. Negative for abdominal pain and vomiting.  Genitourinary: Negative for dysuria and hematuria.  Musculoskeletal: Negative for arthralgias and myalgias.  Neurological: Negative for dizziness, weakness and headaches.  A complete 10 system review of systems was obtained and all systems are negative except as noted in the HPI and PMH.     Physical Exam Updated Vital Signs BP (!) 151/84   Pulse 65   Temp 99.4 F (37.4 C) (Oral)   Resp 16   Ht 5' (1.524 m)   Wt 120 lb (54.4 kg)   SpO2 98%   BMI 23.44 kg/m   Physical Exam  Constitutional: She is oriented to person, place, and time. She appears well-developed and well-nourished. No distress.  HENT:  Head: Normocephalic and atraumatic.  Mouth/Throat: Oropharynx is clear and moist. No oropharyngeal exudate.  Eyes: Conjunctivae and EOM are normal. Pupils are equal, round, and reactive to light.  Neck: Normal range of  motion. Neck supple.  No meningismus.  Cardiovascular: Normal rate, regular rhythm, normal heart sounds and intact distal pulses.   No murmur heard. Pulmonary/Chest: Effort normal and breath sounds normal. No respiratory distress.  Abdominal: Soft. There is no tenderness. There is no rebound and no guarding.  Musculoskeletal: Normal range of motion. She exhibits no edema or tenderness.  Neurological: She is alert and oriented to person, place, and time. No cranial nerve deficit. She exhibits normal muscle tone. Coordination normal.   5/5 strength throughout. CN 2-12 intact.Equal grip strength.   Skin: Skin is warm.  Psychiatric: She has a normal mood and affect. Her behavior is normal.  Nursing  note and vitals reviewed.    ED Treatments / Results  Labs (all labs ordered are listed, but only abnormal results are displayed) Labs Reviewed  BASIC METABOLIC PANEL - Abnormal; Notable for the following:       Result Value   Potassium 2.9 (*)    Glucose, Bld 167 (*)    Calcium 8.6 (*)    GFR calc non Af Amer 59 (*)    All other components within normal limits  CBC - Abnormal; Notable for the following:    RBC 3.25 (*)    Hemoglobin 10.8 (*)    HCT 30.6 (*)    All other components within normal limits  HEPATIC FUNCTION PANEL - Abnormal; Notable for the following:    Albumin 3.0 (*)    All other components within normal limits  PROTIME-INR - Abnormal; Notable for the following:    Prothrombin Time 15.5 (*)    All other components within normal limits  I-STAT TROPOININ, ED - Abnormal; Notable for the following:    Troponin i, poc 0.10 (*)    All other components within normal limits  LIPASE, BLOOD  APTT  HEPARIN LEVEL (UNFRACTIONATED)    EKG  EKG Interpretation  Date/Time:  Sunday June 14 2016 04:18:15 EDT Ventricular Rate:  67 PR Interval:    QRS Duration: 142 QT Interval:  485 QTC Calculation: 513 R Axis:   -9 Text Interpretation:  Sinus rhythm Left bundle branch block  No significant change was found Confirmed by Wyvonnia Dusky  MD, Yuri Fana [phone]) on 06/14/2016 4:34:37 AM       Radiology Dg Chest Port 1 View  Result Date: 06/14/2016 CLINICAL DATA:  Shortness of breath EXAM: PORTABLE CHEST 1 VIEW COMPARISON:  Chest radiograph 10/28/2013 FINDINGS: Unchanged cardiomediastinal contours with atherosclerotic calcification in the aortic arch. There are new opacities in the left mid lung. Medial right basilar opacities are unchanged. IMPRESSION: Near opacities in the left mid lung may indicate developing consolidation, including the possibility of pneumonia. Electronically Signed   By: Ulyses Jarred M.D.   On: 06/14/2016 04:58    Procedures Procedures (including critical care time)  Medications Ordered in ED Medications  aspirin chewable tablet 324 mg (not administered)     Initial Impression / Assessment and Plan / ED Course  I have reviewed the triage vital signs and the nursing notes.  Pertinent labs & imaging results that were available during my care of the patient were reviewed by me and considered in my medical decision making (see chart for details).     Episode of central chest pain associated with shortness of breath, nausea and diaphoresis. Concerning for ACS. EKG shows unchanged left bundle branch block.  She has known history of CAD with occulusion of the RCA with collaterals, and nonobstructive left system disease, with negative  NM stress test in 2013, and therfore treated medically.  Patient remains chest pain-free. EKG is unchanged. Troponin minimally elevated at 0.1. Aspirin given. Labs otherwise reassuring. Chest x-ray shows possible developing infiltrate the patient has no cough, fever or shortness of breath.  Concern for ACS. Heparin drip started. Discussed with cardiology Dr. Kenton Kingfisher who [facility] patient to [facility]. Patient's family in agreement.  CRITICAL CARE Performed by: Ezequiel Essex Total critical care time: 35  minutes Critical care time was exclusive of separately billable procedures and treating other patients. Critical care was necessary to treat or prevent imminent or life-threatening deterioration. Critical care was time spent personally by me on the following activities: development  of treatment plan with patient and/or surrogate as well as nursing, discussions with consultants, evaluation of patient's response to treatment, examination of patient, obtaining history from patient or surrogate, ordering and performing treatments and interventions, ordering and review of laboratory studies, ordering and review of radiographic studies, pulse oximetry and re-evaluation of patient's condition.   Final Clinical Impressions(s) / ED Diagnoses   Final diagnoses:  NSTEMI (non-ST elevated myocardial infarction) [facility])    New Prescriptions New Prescriptions   No medications on file     Ezequiel Essex, MD 06/14/16 [phone]

## 2016-06-14 NOTE — Progress Notes (Addendum)
ANTICOAGULATION CONSULT NOTE  Pharmacy Consult for Heparin Indication: chest pain/ACS  Assessment: 83 yo female admitted 4/15 from [facility] for CP and mild troponin elevation. Pharmacy dosing heparin -Initial heparin level= 0.2  Goal of Therapy:  Heparin level 0.3-0.7 units/ml Monitor platelets by anticoagulation protocol: Yes   Plan:  -Heparin 1000 units IV x1 -Increase infusion to 750 units/hr -Heparin level in 8 hours and daily wth CBC daily   Allergies  Allergen Reactions  . Tylenol [Acetaminophen] Other (See Comments)    Funny feeling in chest.     Patient Measurements: Height: 5' (152.4 cm) Weight: 120 lb (54.4 kg) IBW/kg (Calculated) : 45.5 Heparin Dosing Weight: 54.4 kg  Vital Signs: Temp: 98.3 F (36.8 C) (04/15 1219) Temp Source: Oral (04/15 1219) BP: 155/66 (04/15 1219) Pulse Rate: 65 (04/15 1219)  Labs:  Recent Labs  06/14/16 0410 06/14/16 0457 06/14/16 0942 06/14/16 1212  HGB 10.8*  --   --   --   HCT 30.6*  --   --   --   PLT 318  --   --   --   APTT  --  >200* 61*  --   LABPROT  --  15.5*  --   --   INR  --  1.22  --   --   HEPARINUNFRC  --   --   --  0.20*  CREATININE 0.89  --   --   --   TROPONINI  --   --  0.64*  --     Estimated Creatinine Clearance: 35 mL/min (by C-G formula based on SCr of 0.89 mg/dL).  Medications:  Infusions:  . heparin 650 Units/hr (06/14/16 0930)    Hildred Laser, Pharm D 06/14/2016 2:23 PM

## 2016-06-14 NOTE — Progress Notes (Signed)
Discussed with patient and family the increase in her troponin, and the associated increased concern about CAD. At this time patient is undecided about a cath. She will take the day to discuss with her family and decide in the morning. We will continue medical therapy, continue to cycle enzymes and EKG. Make NPO at midnight.    Carlyle Dolly MD

## 2016-06-14 NOTE — Progress Notes (Signed)
CRITICAL VALUE ALERT  Critical value received:  Troponin 0.64   Date of notification:  06/14/16  Time of notification:  11:30 a.m.   Critical value read back: yes  Nurse who received alert:  Shelton Silvas, RN   MD notified (1st page):  Sharolyn Douglas   Time of first page:  11:55 a.m.   MD notified (2nd page):  Time of second page:  Responding MD:  Sharolyn Douglas   Time MD responded:  11:55

## 2016-06-14 NOTE — ED Triage Notes (Signed)
Pt c/o chest pain that woke her up from sleep tonight, pt took two nitro at home with relief of pain upon arrival to er,

## 2016-06-14 NOTE — ED Notes (Signed)
Date and time results received: 06/14/16  0424 (use smartphrase ".now" to insert current time)  Test: Troponin Critical Value: 0.10  Name of Provider Notified: Rancour  Orders Received? Or Actions Taken?: MD in room to see pt

## 2016-06-14 NOTE — Progress Notes (Signed)
Irene for Heparin Indication: chest pain/ACS  Assessment: 83 yo female admitted 4/15 from [facility] for nitrate-responsive CP and mild troponin elevation. She was initiated on heparin while at [facility] with heparin 3000 unit bolus, then heparin drip at 650 units/hr. Rate confirmed with 2W RN when patient arrived to floor. Heparin was initiated around 0500 this morning. Hemoglobin 10.8, platelets wnl, no bleeding noted. No interruptions in drip per RN.   Goal of Therapy:  Heparin level 0.3-0.7 units/ml Monitor platelets by anticoagulation protocol: Yes   Plan:  Continue heparin 650 units/hr  Obtain 8 hr heparin level at 1300  Monitor daily heparin level and CBC    Allergies  Allergen Reactions  . Tylenol [Acetaminophen] Other (See Comments)    Funny feeling in chest.     Patient Measurements: Height: 5' (152.4 cm) Weight: 120 lb (54.4 kg) IBW/kg (Calculated) : 45.5 Heparin Dosing Weight: 54.4 kg  Vital Signs: Temp: 98.7 F (37.1 C) (04/15 0648) Temp Source: Oral (04/15 0648) BP: 132/76 (04/15 0648) Pulse Rate: 63 (04/15 0648)  Labs:  Recent Labs  06/14/16 0410 06/14/16 0457  HGB 10.8*  --   HCT 30.6*  --   PLT 318  --   APTT  --  >200*  LABPROT  --  15.5*  INR  --  1.22  CREATININE 0.89  --     Estimated Creatinine Clearance: 35 mL/min (by C-G formula based on SCr of 0.89 mg/dL).  Medications:  Infusions:  . heparin      Belia Heman, PharmD PGY1 Pharmacy Resident [phone] (Pager) 06/14/2016 9:31 AM

## 2016-06-14 NOTE — Progress Notes (Signed)
Patient stable during 7 a to 7 p shift, no complaints of chest pain or pressure.  Maintaining oxygen saturation in 90's on room air.  Only complaint has been that she feels dizzy when she walks which is not new today, has been going on for a while.  Also still has dry cough which she has had for several weeks.  Patient and daughter state she has decided to go forward with cath tomorrow.  Discussed patients nothing by mouth status after midnight.  Verbalized understanding.

## 2016-06-15 ENCOUNTER — Inpatient Hospital Stay (HOSPITAL_COMMUNITY): Payer: Medicare Other

## 2016-06-15 ENCOUNTER — Encounter (HOSPITAL_COMMUNITY)
Admission: EM | Disposition: A | Discharge status: Home / Self Care | Payer: Self-pay | Source: Home / Self Care | Attending: Cardiology

## 2016-06-15 DIAGNOSIS — R748 Abnormal levels of other serum enzymes: Secondary | ICD-10-CM

## 2016-06-15 DIAGNOSIS — R072 Precordial pain: Secondary | ICD-10-CM

## 2016-06-15 DIAGNOSIS — I25110 Atherosclerotic heart disease of native coronary artery with unstable angina pectoris: Secondary | ICD-10-CM

## 2016-06-15 DIAGNOSIS — R0600 Dyspnea, unspecified: Secondary | ICD-10-CM

## 2016-06-15 HISTORY — PX: LEFT HEART CATH AND CORONARY ANGIOGRAPHY: CATH118249

## 2016-06-15 LAB — LIPID PANEL
CHOL/HDL RATIO: 3.8 ratio
Cholesterol: 102 mg/dL (ref 0–200)
HDL: 27 mg/dL — ABNORMAL LOW (ref >40)
LDL CALC: 57 mg/dL (ref 0–99)
Triglycerides: 91 mg/dL (ref <150)
VLDL: 18 mg/dL (ref 0–40)

## 2016-06-15 LAB — BASIC METABOLIC PANEL
Anion gap: 10 (ref 5–15)
BUN: 13 mg/dL (ref 6–20)
CALCIUM: 8.6 mg/dL — AB (ref 8.9–10.3)
CO2: 23 mmol/L (ref 22–32)
Chloride: 105 mmol/L (ref 101–111)
Creatinine, Ser: 0.73 mg/dL (ref 0.44–1.00)
GFR calc Af Amer: >60 mL/min (ref >60)
Glucose, Bld: 127 mg/dL — ABNORMAL HIGH (ref 65–99)
Potassium: 3.5 mmol/L (ref 3.5–5.1)
Sodium: 138 mmol/L (ref 135–145)

## 2016-06-15 LAB — ECHOCARDIOGRAM COMPLETE
Height: 60 in
WEIGHTICAEL: 1920 [oz_av]

## 2016-06-15 LAB — CBC
HCT: 30.4 % — ABNORMAL LOW (ref 36.0–46.0)
Hemoglobin: 10.5 g/dL — ABNORMAL LOW (ref 12.0–15.0)
MCH: 32.4 pg (ref 26.0–34.0)
MCHC: 34.5 g/dL (ref 30.0–36.0)
MCV: 93.8 fL (ref 78.0–100.0)
PLATELETS: 289 10*3/uL (ref 150–400)
RBC: 3.24 MIL/uL — ABNORMAL LOW (ref 3.87–5.11)
RDW: 12.2 % (ref 11.5–15.5)
WBC: 8.5 10*3/uL (ref 4.0–10.5)

## 2016-06-15 LAB — HEPARIN LEVEL (UNFRACTIONATED)

## 2016-06-15 LAB — POCT ACTIVATED CLOTTING TIME: Activated Clotting Time: 153 seconds

## 2016-06-15 SURGERY — LEFT HEART CATH AND CORONARY ANGIOGRAPHY
Anesthesia: LOCAL

## 2016-06-15 MED ORDER — HEPARIN BOLUS VIA INFUSION
1000.0000 [IU] | Freq: Once | INTRAVENOUS | Status: AC
  Administered 2016-06-15: 1000 [IU] via INTRAVENOUS
  Filled 2016-06-15: qty 1000

## 2016-06-15 MED ORDER — SODIUM CHLORIDE 0.9 % IV SOLN: 250.0000 mL | INTRAVENOUS | Status: DC | PRN

## 2016-06-15 MED ORDER — FENTANYL CITRATE (PF) 100 MCG/2ML IJ SOLN
INTRAMUSCULAR | Status: DC | PRN
  Administered 2016-06-15: 25 ug via INTRAVENOUS

## 2016-06-15 MED ORDER — ONDANSETRON HCL 4 MG/2ML IJ SOLN
4.0000 mg | Freq: Four times a day (QID) | INTRAMUSCULAR | Status: DC | PRN
  Administered 2016-06-15 – 2016-06-19 (×3): 4 mg via INTRAVENOUS
  Filled 2016-06-15 (×3): qty 2

## 2016-06-15 MED ORDER — HEPARIN (PORCINE) IN NACL 2-0.9 UNIT/ML-% IJ SOLN
INTRAMUSCULAR | Status: AC
  Filled 2016-06-15: qty 1000

## 2016-06-15 MED ORDER — SODIUM CHLORIDE 0.9% FLUSH: 3.0000 mL | INTRAVENOUS | Status: DC | PRN

## 2016-06-15 MED ORDER — SODIUM CHLORIDE 0.9% FLUSH
3.0000 mL | Freq: Two times a day (BID) | INTRAVENOUS | Status: DC
  Administered 2016-06-16 – 2016-06-19 (×7): 3 mL via INTRAVENOUS

## 2016-06-15 MED ORDER — LORAZEPAM 2 MG/ML IJ SOLN
0.5000 mg | Freq: Once | INTRAMUSCULAR | Status: AC
  Administered 2016-06-15: 0.5 mg via INTRAVENOUS
  Filled 2016-06-15: qty 1

## 2016-06-15 MED ORDER — SODIUM CHLORIDE 0.9 % WEIGHT BASED INFUSION
3.0000 mL/kg/h | INTRAVENOUS | Status: DC
  Administered 2016-06-15: 3 mL/kg/h via INTRAVENOUS

## 2016-06-15 MED ORDER — SODIUM CHLORIDE 0.9 % IV SOLN
INTRAVENOUS | Status: AC
  Administered 2016-06-15: 18:00:00 via INTRAVENOUS

## 2016-06-15 MED ORDER — SODIUM CHLORIDE 0.9% FLUSH
3.0000 mL | Freq: Two times a day (BID) | INTRAVENOUS | Status: DC
  Administered 2016-06-15: 3 mL via INTRAVENOUS

## 2016-06-15 MED ORDER — ALBUTEROL SULFATE (2.5 MG/3ML) 0.083% IN NEBU: 2.5000 mg | INHALATION_SOLUTION | RESPIRATORY_TRACT | Status: DC | PRN

## 2016-06-15 MED ORDER — HEPARIN (PORCINE) IN NACL 2-0.9 UNIT/ML-% IJ SOLN
INTRAMUSCULAR | Status: DC | PRN
  Administered 2016-06-15: 15:00:00

## 2016-06-15 MED ORDER — ACETAMINOPHEN 325 MG PO TABS
650.0000 mg | ORAL_TABLET | ORAL | Status: DC | PRN
  Filled 2016-06-15: qty 2

## 2016-06-15 MED ORDER — LIDOCAINE HCL (PF) 1 % IJ SOLN
INTRAMUSCULAR | Status: DC | PRN
  Administered 2016-06-15: 15 mL

## 2016-06-15 MED ORDER — LIDOCAINE HCL (PF) 1 % IJ SOLN
INTRAMUSCULAR | Status: AC
  Filled 2016-06-15: qty 30

## 2016-06-15 MED ORDER — SODIUM CHLORIDE 0.9 % WEIGHT BASED INFUSION
1.0000 mL/kg/h | INTRAVENOUS | Status: DC
  Administered 2016-06-15: 1 mL/kg/h via INTRAVENOUS

## 2016-06-15 MED ORDER — ASPIRIN 81 MG PO CHEW: 81.0000 mg | CHEWABLE_TABLET | ORAL | Status: DC

## 2016-06-15 MED ORDER — HYDRALAZINE HCL 20 MG/ML IJ SOLN
10.0000 mg | INTRAMUSCULAR | Status: DC | PRN
  Administered 2016-06-15 – 2016-06-19 (×3): 10 mg via INTRAVENOUS
  Filled 2016-06-15 (×4): qty 1

## 2016-06-15 MED ORDER — IOPAMIDOL (ISOVUE-370) INJECTION 76%
INTRAVENOUS | Status: AC
  Filled 2016-06-15: qty 100

## 2016-06-15 MED ORDER — ALBUTEROL SULFATE (2.5 MG/3ML) 0.083% IN NEBU
2.5000 mg | INHALATION_SOLUTION | Freq: Once | RESPIRATORY_TRACT | Status: AC
  Administered 2016-06-15: 2.5 mg via RESPIRATORY_TRACT
  Filled 2016-06-15: qty 3

## 2016-06-15 MED ORDER — MIDAZOLAM HCL 2 MG/2ML IJ SOLN
INTRAMUSCULAR | Status: DC | PRN
  Administered 2016-06-15: 1 mg via INTRAVENOUS

## 2016-06-15 MED ORDER — IOPAMIDOL (ISOVUE-370) INJECTION 76%
INTRAVENOUS | Status: DC | PRN
  Administered 2016-06-15: 60 mL

## 2016-06-15 MED ORDER — MIDAZOLAM HCL 2 MG/2ML IJ SOLN
INTRAMUSCULAR | Status: AC
  Filled 2016-06-15: qty 2

## 2016-06-15 MED ORDER — MORPHINE SULFATE (PF) 2 MG/ML IV SOLN
2.0000 mg | INTRAVENOUS | Status: DC | PRN
  Administered 2016-06-17 – 2016-06-19 (×5): 2 mg via INTRAVENOUS
  Filled 2016-06-15 (×5): qty 1

## 2016-06-15 MED ORDER — FENTANYL CITRATE (PF) 100 MCG/2ML IJ SOLN
INTRAMUSCULAR | Status: AC
  Filled 2016-06-15: qty 2

## 2016-06-15 MED ORDER — ASPIRIN 81 MG PO CHEW
81.0000 mg | CHEWABLE_TABLET | Freq: Every day | ORAL | Status: DC
  Administered 2016-06-16 – 2016-06-20 (×5): 81 mg via ORAL
  Filled 2016-06-15 (×5): qty 1

## 2016-06-15 SURGICAL SUPPLY — 8 items
CATH INFINITI 5FR MULTPACK ANG (CATHETERS) ×1 IMPLANT
KIT HEART LEFT (KITS) ×2 IMPLANT
PACK CARDIAC CATHETERIZATION (CUSTOM PROCEDURE TRAY) ×2 IMPLANT
SHEATH PINNACLE 5F 10CM (SHEATH) ×1 IMPLANT
TRANSDUCER W/STOPCOCK (MISCELLANEOUS) ×2 IMPLANT
TUBING CIL FLEX 10 FLL-RA (TUBING) ×1 IMPLANT
WIRE EMERALD 3MM-J .035X150CM (WIRE) ×1 IMPLANT
WIRE HI TORQ VERSACORE-J 145CM (WIRE) ×1 IMPLANT

## 2016-06-15 NOTE — Progress Notes (Signed)
60fr right femoral arterial sheath aspirated and pulled.  Pressure held for 20 minutes.  Hemostasis achieved.  Site level 0.  Tegaderm and gauze dressing applied to site.  Instructions given to patient.  Pt verbalizes understanding of instructions.    Bedrest begins at 3:50pm.

## 2016-06-15 NOTE — Consult Note (Signed)
[facility] CM Primary Care Navigator  06/15/2016  JONA ERKKILA 1931-05-17 [id number]   Met with patient and family members at the bedside to identify possible discharge needs. Daughter [name]) reports that patient have had chest pains that led to this admission.  Patient's daughter Dewaine Oats) endorses Alma Friendly, NP with [facility] at [facility] as patient's primary care provider.    Patient shared using [facility] at [facility] to obtain medications without any problem.   Patient manages her own medications at home using "pill box" system weekly.   Daughters Science writer or [place]) provide transportation to her doctors' appointments.  Patient lives with daughter Dewaine Oats who is the primary caregiver at home, however, other daughters will provide care for patient at home when Dewaine Oats goes out of the country [place]) for 2 weeks as stated.  Discharge plan is still not determined pending PT/ OT recommendation and physician order.  Patient and daughters voiced understanding to call primary care provider's office when she returns back home for a post discharge follow-up appointment within a week or sooner if needs arise. Patient letter (with PCP's contact number) was provided as their reminder.  Patient and family denied any other health management needs or concerns at this time.  For additional questions please contact:  Edwena Felty A. Allegra Cerniglia, BSN, RN-BC [facility] PRIMARY CARE Navigator Cell: [phone]

## 2016-06-15 NOTE — H&P (View-Only) (Signed)
Progress Note  Patient Name: Cindy Brandt Date of Encounter: 06/15/2016  Primary Cardiologist: Dr. Harl Bowie  Subjective   No chest pain or shortness of breath. Cough improved on Azithromycin.   Inpatient Medications    Scheduled Meds: . aspirin EC  81 mg Oral q morning - 10a  . atenolol  100 mg Oral Daily  . atorvastatin  80 mg Oral q1800  . azithromycin  250 mg Oral Daily  . cloNIDine  0.2 mg Oral BID  . dicyclomine  10 mg Oral BID  . mirabegron ER  25 mg Oral Daily  . pantoprazole  40 mg Oral Daily  . sertraline  100 mg Oral Daily  . spironolactone  25 mg Oral Daily   Continuous Infusions: . heparin 850 Units/hr (06/15/16 0705)   PRN Meds: ibuprofen, nitroGLYCERIN, ondansetron (ZOFRAN) IV   Vital Signs    Vitals:   06/14/16 1718 06/14/16 2020 06/15/16 0029 06/15/16 0513  BP: (!) 164/79 (!) 144/64 (!) 114/58 (!) 160/71  Pulse: 65 64 65 70  Resp:  18 18 18   Temp: 98.2 F (36.8 C) 98.6 F (37 C) 98.4 F (36.9 C) 98.7 F (37.1 C)  TempSrc: Oral Oral Oral Oral  SpO2: 100% 99% 96% 99%  Weight:      Height:        Intake/Output Summary (Last 24 hours) at 06/15/16 0949 Last data filed at 06/14/16 1115  Gross per 24 hour  Intake                0 ml  Output              300 ml  Net             -300 ml   Filed Weights   06/14/16 0406  Weight: 120 lb (54.4 kg)    Telemetry     NSR with PACs - Personally Reviewed  ECG    Sinus rhythm with chronic LBBB- Personally Reviewed  Physical Exam   GEN: No acute distress.   Neck: No JVD Cardiac: RRR, no murmurs, rubs, or gallops.  Respiratory:Diffuse wheezing on upper bilateral lobs GI: Soft, nontender, non-distended  MS: No edema; No deformity. Neuro:  Nonfocal  Psych: Normal affect   Labs    Chemistry Recent Labs Lab 06/14/16 0410 06/14/16 0457 06/15/16 0240  NA 141  --  138  K 2.9*  --  3.5  CL 108  --  105  CO2 25  --  23  GLUCOSE 167*  --  127*  BUN 19  --  13  CREATININE 0.89  --  0.73    CALCIUM 8.6*  --  8.6*  PROT  --  6.7  --   ALBUMIN  --  3.0*  --   AST  --  18  --   ALT  --  14  --   ALKPHOS  --  39  --   BILITOT  --  0.5  --   GFRNONAA 59*  --  >60  GFRAA >60  --  >60  ANIONGAP 8  --  10     Hematology Recent Labs Lab 06/14/16 0410 06/15/16 0240  WBC 9.3 8.5  RBC 3.25* 3.24*  HGB 10.8* 10.5*  HCT 30.6* 30.4*  MCV 94.2 93.8  MCH 33.2 32.4  MCHC 35.3 34.5  RDW 11.9 12.2  PLT 318 289    Cardiac Enzymes Recent Labs Lab 06/14/16 0942 06/14/16 1442 06/14/16 2212  TROPONINI 0.64*  0.55* 0.57*    Recent Labs Lab 06/14/16 0414  TROPIPOC 0.10*     BNPNo results for input(s): BNP, PROBNP in the last 168 hours.   DDimer No results for input(s): DDIMER in the last 168 hours.   Radiology    X-ray Chest Pa And Lateral  Result Date: 06/14/2016 CLINICAL DATA:  83 year old female with mid chest pain last night. Initial encounter. EXAM: CHEST  2 VIEW COMPARISON:  Portable chest 0443 hours today and earlier. FINDINGS: Stable cardiomegaly and mediastinal contours. Calcified aortic atherosclerosis. Chronic tortuosity of the thoracic aorta. Stable mediastinal contours. Lung volumes have not significantly changed. There is mild patchy opacity lateral to the left hilum. No pneumothorax, pulmonary edema or pleural effusion. No other acute pulmonary opacity identified. Osteopenia. No acute osseous abnormality identified. Stable cholecystectomy clips. Negative visible bowel gas pattern. IMPRESSION: 1. Patchy left mid lung opacity appears to be acute and is nonspecific. If there is fever or cough consider bronchopneumonia. 2. No pleural effusion. 3. Stable cardiomegaly.  Calcified aortic atherosclerosis. Electronically Signed   By: Genevie Ann M.D.   On: 06/14/2016 15:38   Dg Chest Port 1 View  Result Date: 06/14/2016 CLINICAL DATA:  Shortness of breath EXAM: PORTABLE CHEST 1 VIEW COMPARISON:  Chest radiograph 10/28/2013 FINDINGS: Unchanged cardiomediastinal contours  with atherosclerotic calcification in the aortic arch. There are new opacities in the left mid lung. Medial right basilar opacities are unchanged. IMPRESSION: Near opacities in the left mid lung may indicate developing consolidation, including the possibility of pneumonia. Electronically Signed   By: Ulyses Jarred M.D.   On: 06/14/2016 04:58    Cardiac Studies   Pending echo and cath   Patient Profile     83 y/o ? with a h/o CAD (CTO of RCA & nonobs LAD/LCX dzs - 2005), HTN, HL, Diast Dysfxn, hypokalemia, and gastritis (EGD 04/2016), who presents on transfer from [facility] for admission after presentation w/ nitrate responsive chest pain and mild troponin elevation.   Assessment & Plan    1. Unstable angina with hx of CAD - POC troponin 0.1. Troponin trend 0.64-->0.55-->0.57. In setting of possible pneumonia.  TSH normal. - She was undecided about cath yesterday, today she wants to proceed with cath. Pending echo. Remained chest pain free.  - Continue ASA, statin, BB and IV heparin.   2. Essential hypertension - Blood pressure is currently stable. Continue beta blocker, clonidine, and spironolactone.  3. Cough with possible pneumonia - CXR concerning for bronchopneumonia. Given Azithromycin 500mg  4/15, now on 250mg  qd. Cough improved. She dose have diffuse wheezing on upper lobs. No fever or chills. Follow clinically. Will given nebulizer treatment x 1 and then PRN.   4. Hypokalemia - Resolved.   5. HLD - 06/15/2016: Cholesterol 102; HDL 27; LDL Cholesterol 57; Triglycerides 91; VLDL 18  - Continue statin.   Jarrett Soho, PA  06/15/2016, 9:49 AM     Patient seen and examined. Agree with assessment and plan. No recurrent chest pain. Troponins mildly positive with flat curve suggesting possible demand ischemia.  She has known CTO of RCA in 2003 and last cath in 2005 revealed mild LAD/LCX disease. Chest pain was nitrate responsive.  Family has decided to pursue cath today; plan  for this afternoon. I have reviewed the risks, indications, and alternatives to cardiac catheterization, possible angioplasty, and stenting with the patient. Risks include but are not limited to bleeding, infection, vascular injury, stroke, myocardial infection, arrhythmia, kidney injury, radiation-related injury in the case of prolonged  fluoroscopy use, emergency cardiac surgery, and death. The patient understands the risks of serious complication is 1-2 in 8675 with diagnostic cardiac cath and 1-2% or less with angioplasty/stenting. No wheezing presently on exam by me.    Troy Sine, MD, [facility] 06/15/2016 10:14 AM

## 2016-06-15 NOTE — Progress Notes (Addendum)
Pt family called out about patient having chest pain. Upon assessment pt stated she was having chest pain/ discomfort. VS taken. BP elevated still after hydralzaine from 1800. 160/77 down to 132/69 after 1nitro tab, 118/81 after 2nd ntro tab. 2 nitro total given and EKG performed. EKG showed NSR in 70s. Pt chest pain relieved after 2 nitro SL tabs. Pt resting in bed. Pt states "I feel better" . Will continue to monitor. Paged cardiology on call and made then aware of pt CP episode

## 2016-06-15 NOTE — Interval H&P Note (Signed)
Cath Lab Visit (complete for each Cath Lab visit)  Clinical Evaluation Leading to the Procedure:   ACS: Yes.    Non-ACS:    Anginal Classification: CCS III  Anti-ischemic medical therapy: No Therapy  Non-Invasive Test Results: No non-invasive testing performed  Prior CABG: No previous CABG      History and Physical Interval Note:  06/15/2016 2:44 PM  Cindy Brandt  has presented today for surgery, with the diagnosis of unstable angina  The various methods of treatment have been discussed with the patient and family. After consideration of risks, benefits and other options for treatment, the patient has consented to  Procedure(s): Left Heart Cath and Coronary Angiography (N/A) as a surgical intervention .  The patient's history has been reviewed, patient examined, no change in status, stable for surgery.  I have reviewed the patient's chart and labs.  Questions were answered to the patient's satisfaction.     Quay Burow

## 2016-06-15 NOTE — Progress Notes (Signed)
Progress Note  Patient Name: Cindy Brandt Date of Encounter: 06/15/2016  Primary Cardiologist: Dr. Harl Bowie  Subjective   No chest pain or shortness of breath. Cough improved on Azithromycin.   Inpatient Medications    Scheduled Meds: . aspirin EC  81 mg Oral q morning - 10a  . atenolol  100 mg Oral Daily  . atorvastatin  80 mg Oral q1800  . azithromycin  250 mg Oral Daily  . cloNIDine  0.2 mg Oral BID  . dicyclomine  10 mg Oral BID  . mirabegron ER  25 mg Oral Daily  . pantoprazole  40 mg Oral Daily  . sertraline  100 mg Oral Daily  . spironolactone  25 mg Oral Daily   Continuous Infusions: . heparin 850 Units/hr (06/15/16 0705)   PRN Meds: ibuprofen, nitroGLYCERIN, ondansetron (ZOFRAN) IV   Vital Signs    Vitals:   06/14/16 1718 06/14/16 2020 06/15/16 0029 06/15/16 0513  BP: (!) 164/79 (!) 144/64 (!) 114/58 (!) 160/71  Pulse: 65 64 65 70  Resp:  18 18 18   Temp: 98.2 F (36.8 C) 98.6 F (37 C) 98.4 F (36.9 C) 98.7 F (37.1 C)  TempSrc: Oral Oral Oral Oral  SpO2: 100% 99% 96% 99%  Weight:      Height:        Intake/Output Summary (Last 24 hours) at 06/15/16 0949 Last data filed at 06/14/16 1115  Gross per 24 hour  Intake                0 ml  Output              300 ml  Net             -300 ml   Filed Weights   06/14/16 0406  Weight: 120 lb (54.4 kg)    Telemetry     NSR with PACs - Personally Reviewed  ECG    Sinus rhythm with chronic LBBB- Personally Reviewed  Physical Exam   GEN: No acute distress.   Neck: No JVD Cardiac: RRR, no murmurs, rubs, or gallops.  Respiratory:Diffuse wheezing on upper bilateral lobs GI: Soft, nontender, non-distended  MS: No edema; No deformity. Neuro:  Nonfocal  Psych: Normal affect   Labs    Chemistry Recent Labs Lab 06/14/16 0410 06/14/16 0457 06/15/16 0240  NA 141  --  138  K 2.9*  --  3.5  CL 108  --  105  CO2 25  --  23  GLUCOSE 167*  --  127*  BUN 19  --  13  CREATININE 0.89  --  0.73    CALCIUM 8.6*  --  8.6*  PROT  --  6.7  --   ALBUMIN  --  3.0*  --   AST  --  18  --   ALT  --  14  --   ALKPHOS  --  39  --   BILITOT  --  0.5  --   GFRNONAA 59*  --  >60  GFRAA >60  --  >60  ANIONGAP 8  --  10     Hematology Recent Labs Lab 06/14/16 0410 06/15/16 0240  WBC 9.3 8.5  RBC 3.25* 3.24*  HGB 10.8* 10.5*  HCT 30.6* 30.4*  MCV 94.2 93.8  MCH 33.2 32.4  MCHC 35.3 34.5  RDW 11.9 12.2  PLT 318 289    Cardiac Enzymes Recent Labs Lab 06/14/16 0942 06/14/16 1442 06/14/16 2212  TROPONINI 0.64*  0.55* 0.57*    Recent Labs Lab 06/14/16 0414  TROPIPOC 0.10*     BNPNo results for input(s): BNP, PROBNP in the last 168 hours.   DDimer No results for input(s): DDIMER in the last 168 hours.   Radiology    X-ray Chest Pa And Lateral  Result Date: 06/14/2016 CLINICAL DATA:  83 year old female with mid chest pain last night. Initial encounter. EXAM: CHEST  2 VIEW COMPARISON:  Portable chest 0443 hours today and earlier. FINDINGS: Stable cardiomegaly and mediastinal contours. Calcified aortic atherosclerosis. Chronic tortuosity of the thoracic aorta. Stable mediastinal contours. Lung volumes have not significantly changed. There is mild patchy opacity lateral to the left hilum. No pneumothorax, pulmonary edema or pleural effusion. No other acute pulmonary opacity identified. Osteopenia. No acute osseous abnormality identified. Stable cholecystectomy clips. Negative visible bowel gas pattern. IMPRESSION: 1. Patchy left mid lung opacity appears to be acute and is nonspecific. If there is fever or cough consider bronchopneumonia. 2. No pleural effusion. 3. Stable cardiomegaly.  Calcified aortic atherosclerosis. Electronically Signed   By: Genevie Ann M.D.   On: 06/14/2016 15:38   Dg Chest Port 1 View  Result Date: 06/14/2016 CLINICAL DATA:  Shortness of breath EXAM: PORTABLE CHEST 1 VIEW COMPARISON:  Chest radiograph 10/28/2013 FINDINGS: Unchanged cardiomediastinal contours  with atherosclerotic calcification in the aortic arch. There are new opacities in the left mid lung. Medial right basilar opacities are unchanged. IMPRESSION: Near opacities in the left mid lung may indicate developing consolidation, including the possibility of pneumonia. Electronically Signed   By: Ulyses Jarred M.D.   On: 06/14/2016 04:58    Cardiac Studies   Pending echo and cath   Patient Profile     83 y/o ? with a h/o CAD (CTO of RCA & nonobs LAD/LCX dzs - 2005), HTN, HL, Diast Dysfxn, hypokalemia, and gastritis (EGD 04/2016), who presents on transfer from [facility] for admission after presentation w/ nitrate responsive chest pain and mild troponin elevation.   Assessment & Plan    1. Unstable angina with hx of CAD - POC troponin 0.1. Troponin trend 0.64-->0.55-->0.57. In setting of possible pneumonia.  TSH normal. - She was undecided about cath yesterday, today she wants to proceed with cath. Pending echo. Remained chest pain free.  - Continue ASA, statin, BB and IV heparin.   2. Essential hypertension - Blood pressure is currently stable. Continue beta blocker, clonidine, and spironolactone.  3. Cough with possible pneumonia - CXR concerning for bronchopneumonia. Given Azithromycin 500mg  4/15, now on 250mg  qd. Cough improved. She dose have diffuse wheezing on upper lobs. No fever or chills. Follow clinically. Will given nebulizer treatment x 1 and then PRN.   4. Hypokalemia - Resolved.   5. HLD - 06/15/2016: Cholesterol 102; HDL 27; LDL Cholesterol 57; Triglycerides 91; VLDL 18  - Continue statin.   Jarrett Soho, PA  06/15/2016, 9:49 AM     Patient seen and examined. Agree with assessment and plan. No recurrent chest pain. Troponins mildly positive with flat curve suggesting possible demand ischemia.  She has known CTO of RCA in 2003 and last cath in 2005 revealed mild LAD/LCX disease. Chest pain was nitrate responsive.  Family has decided to pursue cath today; plan  for this afternoon. I have reviewed the risks, indications, and alternatives to cardiac catheterization, possible angioplasty, and stenting with the patient. Risks include but are not limited to bleeding, infection, vascular injury, stroke, myocardial infection, arrhythmia, kidney injury, radiation-related injury in the case of prolonged  fluoroscopy use, emergency cardiac surgery, and death. The patient understands the risks of serious complication is 1-2 in 6433 with diagnostic cardiac cath and 1-2% or less with angioplasty/stenting. No wheezing presently on exam by me.    Troy Sine, MD, [facility] 06/15/2016 10:14 AM

## 2016-06-16 ENCOUNTER — Encounter (HOSPITAL_COMMUNITY): Payer: Self-pay | Admitting: Cardiovascular Disease

## 2016-06-16 DIAGNOSIS — R0609 Other forms of dyspnea: Secondary | ICD-10-CM

## 2016-06-16 DIAGNOSIS — E876 Hypokalemia: Secondary | ICD-10-CM

## 2016-06-16 LAB — CBC
HEMATOCRIT: 27.9 % — AB (ref 36.0–46.0)
Hemoglobin: 9.7 g/dL — ABNORMAL LOW (ref 12.0–15.0)
MCH: 32.7 pg (ref 26.0–34.0)
MCHC: 34.8 g/dL (ref 30.0–36.0)
MCV: 93.9 fL (ref 78.0–100.0)
Platelets: 308 10*3/uL (ref 150–400)
RBC: 2.97 MIL/uL — ABNORMAL LOW (ref 3.87–5.11)
RDW: 12.2 % (ref 11.5–15.5)
WBC: 6.9 10*3/uL (ref 4.0–10.5)

## 2016-06-16 LAB — BASIC METABOLIC PANEL
ANION GAP: 7 (ref 5–15)
BUN: 10 mg/dL (ref 6–20)
CALCIUM: 8.3 mg/dL — AB (ref 8.9–10.3)
CO2: 23 mmol/L (ref 22–32)
Chloride: 108 mmol/L (ref 101–111)
Creatinine, Ser: 0.7 mg/dL (ref 0.44–1.00)
Glucose, Bld: 119 mg/dL — ABNORMAL HIGH (ref 65–99)
Potassium: 3.2 mmol/L — ABNORMAL LOW (ref 3.5–5.1)
SODIUM: 138 mmol/L (ref 135–145)

## 2016-06-16 MED ORDER — CLOPIDOGREL BISULFATE 75 MG PO TABS
75.0000 mg | ORAL_TABLET | Freq: Every day | ORAL | Status: DC
  Administered 2016-06-17 – 2016-06-20 (×4): 75 mg via ORAL
  Filled 2016-06-16 (×4): qty 1

## 2016-06-16 MED ORDER — ISOSORBIDE MONONITRATE ER 60 MG PO TB24
60.0000 mg | ORAL_TABLET | Freq: Every day | ORAL | Status: DC
  Administered 2016-06-17: 60 mg via ORAL
  Filled 2016-06-16: qty 1

## 2016-06-16 MED ORDER — IBUPROFEN 600 MG PO TABS
600.0000 mg | ORAL_TABLET | Freq: Four times a day (QID) | ORAL | Status: DC | PRN
  Administered 2016-06-16 – 2016-06-19 (×6): 600 mg via ORAL
  Filled 2016-06-16 (×6): qty 1

## 2016-06-16 MED ORDER — ISOSORBIDE MONONITRATE ER 30 MG PO TB24
30.0000 mg | ORAL_TABLET | Freq: Every day | ORAL | Status: DC
  Administered 2016-06-16: 30 mg via ORAL
  Filled 2016-06-16: qty 1

## 2016-06-16 MED ORDER — CLOPIDOGREL BISULFATE 75 MG PO TABS
150.0000 mg | ORAL_TABLET | Freq: Once | ORAL | Status: AC
  Administered 2016-06-16: 150 mg via ORAL
  Filled 2016-06-16: qty 2

## 2016-06-16 MED ORDER — POTASSIUM CHLORIDE CRYS ER 20 MEQ PO TBCR
40.0000 meq | EXTENDED_RELEASE_TABLET | Freq: Once | ORAL | Status: AC
  Administered 2016-06-16: 40 meq via ORAL
  Filled 2016-06-16: qty 2

## 2016-06-16 NOTE — Care Management Note (Signed)
Case Management Note Marvetta Gibbons RN, BSN Unit 2W-Case Manager [phone]  Patient Details  Name: Cindy Brandt MRN: [id number] Date of Birth: 11/19/1931  Subjective/Objective:  Pt admitted with NSTEMI, s/p cath with plan for medical management                 Action/Plan: PTA pt lived at home- plan to return home- no CM needs noted for discharge.  Expected Discharge Date:  06/16/16               Expected Discharge Plan:  Home/Self Care  In-House Referral:     Discharge planning Services  CM Consult  Post Acute Care Choice:  NA Choice offered to:  NA  DME Arranged:    DME Agency:     HH Arranged:    HH Agency:     Status of Service:  Completed, signed off  If discussed at [employer] of Stay Meetings, dates discussed:    Additional Comments:  Dawayne Patricia, RN 06/16/2016, 2:46 PM

## 2016-06-16 NOTE — Progress Notes (Signed)
Progress Note  Patient Name: Cindy Brandt Date of Encounter: 06/16/2016  Primary Cardiologist: Harl Bowie  Subjective   Feeling better this afternoon, but did have two episodes of chest pain last night and earlier this morning. None currently.   Inpatient Medications    Scheduled Meds: . aspirin  81 mg Oral Daily  . atenolol  100 mg Oral Daily  . atorvastatin  80 mg Oral q1800  . azithromycin  250 mg Oral Daily  . cloNIDine  0.2 mg Oral BID  . dicyclomine  10 mg Oral BID  . mirabegron ER  25 mg Oral Daily  . pantoprazole  40 mg Oral Daily  . sertraline  100 mg Oral Daily  . sodium chloride flush  3 mL Intravenous Q12H  . spironolactone  25 mg Oral Daily   Continuous Infusions: . sodium chloride     PRN Meds: sodium chloride, acetaminophen, albuterol, hydrALAZINE, ibuprofen, morphine injection, nitroGLYCERIN, ondansetron (ZOFRAN) IV, sodium chloride flush   Vital Signs    Vitals:   06/15/16 2156 06/15/16 2243 06/16/16 0556 06/16/16 1343  BP: 98/67 125/73 (!) 158/68 (!) 114/53  Pulse: 70 78 75 62  Resp:  18 18 18   Temp:  98.9 F (37.2 C) 98.6 F (37 C) 98 F (36.7 C)  TempSrc:  Oral Oral Oral  SpO2: 99% 97% 96% 97%  Weight:      Height:        Intake/Output Summary (Last 24 hours) at 06/16/16 1500 Last data filed at 06/16/16 0900  Gross per 24 hour  Intake                0 ml  Output              900 ml  Net             -900 ml   Filed Weights   06/14/16 0406  Weight: 120 lb (54.4 kg)    Telemetry    SR with PACs- Personally Reviewed  ECG    SR with known LBBB - Personally Reviewed  Physical Exam   General: Well developed, well nourished, older AA female appearing in no acute distress. Head: Normocephalic, atraumatic.  Neck: Supple without bruits, JVD. Lungs:  Resp regular and unlabored, CTA. Heart: RRR, S1, S2, no S3, S4, or murmur; no rub. Abdomen: Soft, non-tender, non-distended with normoactive bowel sounds. No hepatomegaly. No  rebound/guarding. No obvious abdominal masses. Extremities: No clubbing, cyanosis, edema. Distal pedal pulses are 2+ bilaterally. Right femoral site stable without hematoma, or bruising. Neuro: Alert and oriented X 3. Moves all extremities spontaneously. Psych: Normal affect.  Labs    Chemistry Recent Labs Lab 06/14/16 0410 06/14/16 0457 06/15/16 0240 06/16/16 0244  NA 141  --  138 138  K 2.9*  --  3.5 3.2*  CL 108  --  105 108  CO2 25  --  23 23  GLUCOSE 167*  --  127* 119*  BUN 19  --  13 10  CREATININE 0.89  --  0.73 0.70  CALCIUM 8.6*  --  8.6* 8.3*  PROT  --  6.7  --   --   ALBUMIN  --  3.0*  --   --   AST  --  18  --   --   ALT  --  14  --   --   ALKPHOS  --  39  --   --   BILITOT  --  0.5  --   --  GFRNONAA 59*  --  >60 >60  GFRAA >60  --  >60 >60  ANIONGAP 8  --  10 7     Hematology Recent Labs Lab 06/14/16 0410 06/15/16 0240 06/16/16 0244  WBC 9.3 8.5 6.9  RBC 3.25* 3.24* 2.97*  HGB 10.8* 10.5* 9.7*  HCT 30.6* 30.4* 27.9*  MCV 94.2 93.8 93.9  MCH 33.2 32.4 32.7  MCHC 35.3 34.5 34.8  RDW 11.9 12.2 12.2  PLT 318 289 308    Cardiac Enzymes Recent Labs Lab 06/14/16 0942 06/14/16 1442 06/14/16 2212  TROPONINI 0.64* 0.55* 0.57*    Recent Labs Lab 06/14/16 0414  TROPIPOC 0.10*     BNPNo results for input(s): BNP, PROBNP in the last 168 hours.   DDimer No results for input(s): DDIMER in the last 168 hours.    Radiology    No results found.  Cardiac Studies   Cath: 06/15/16  IMPRESSION per Dr. Gwenlyn Found: Ms. Tirrell had a chronically occluded right coronary artery left Reich lateral, occluded circumflex and high-grade ostial moderate ramus branch disease. She did have 30-40% ulcerated distal left main with a widely patent LAD supplying collaterals to the right. I do not think a ramus branch is necessarily suitable for percutaneous vascularization. I recommend medical therapy. The sheath was removed and pressure was held on the groin to achieve  hemostasis. The patient left the lab in stable condition. She'll be gently dehydrated given her moderate renal insufficiency.  Patient Profile     83 y.o. female with a h/o CAD (CTO of RCA & nonobs LAD/LCX dzs - 2005), HTN, HL, Diast Dysfxn, hypokalemia, and gastritis (EGD 04/2016), who presents on transfer from [facility] for admission after presentation w/ nitrate responsive chest pain and mild troponin elevation  Assessment & Plan    1. Unstable angina with hx of CAD - POC troponin 0.1. Troponin trend 0.64-->0.55-->0.57. In setting of possible pneumonia.  TSH normal. - underwent cath yesterday with results noted above. Has remained somewhat hypertensive and had 2 episodes of chest pain last evening and this morning.  -- will add Imdur  2. Essential hypertension - Continue beta blocker, clonidine, and spironolactone. - add Imdur  3. Cough with possible pneumonia - CXR concerning for bronchopneumonia. Given Azithromycin 500mg  4/15, now on 250mg  qd. Cough improved. Breathing is much better. No fever or chills, or WBC. Follow clinically.   4. Hypokalemia - Replace x1   5. HLD - 06/15/2016: Cholesterol 102; HDL 27; LDL Cholesterol 57; Triglycerides 91; VLDL 18  - Continue statin.   Signed, Reino Bellis, NP  06/16/2016, 3:00 PM    Patient seen and examined. Agree with assessment and plan.I have personally reviewed the angiogram from the catheterization yesterday.  The patient has known RCA occlusion with left to right collaterals.  There appears to be an ulcerated plaque at the ostium of an upper takeoff ramus intermediate vessel.  Her circumflex is occluded and her LAD has 50% stenosis.   Pt had mild discomfort last night. If the plan is to attempt aggressive med Rx will add Plavix.Titrate nitrates, continue BB and possibly add amlodipine if BP allows. K replete If recurrent symptoms despite aggressive med treatment may need considerstion for ? CABG. Troy Sine, MD,  [facility] 06/16/2016 5:49 PM

## 2016-06-16 NOTE — Discharge Summary (Signed)
Discharge Summary    Patient ID: Cindy Brandt,  MRN: [id number], DOB/AGE: Sep 15, 1931 83 y.o.  Admit date: 06/14/2016 Discharge date: 06/20/2016  Primary Care Provider: Sheral Brandt Primary Cardiologist: Cindy Brandt  Discharge Diagnoses    Active Problems:   NSTEMI (non-ST elevated myocardial infarction) ([facility])   Unstable angina (HCC)   Dyspnea   Anemia  Allergies Allergies  Allergen Reactions  . Tylenol [Acetaminophen] Other (See Comments)    Funny feeling in chest.     Diagnostic Studies/Procedures    Cath: 06/15/16  IMPRESSION per Dr. Gwenlyn Brandt: Cindy Brandt had a chronically occluded right coronary artery left Reich lateral, occluded circumflex and high-grade ostial moderate ramus [facility] Brandt disease. She did have 30-40% ulcerated distal left main with a widely patent LAD supplying collaterals to the right. I do not think a ramus Cindy Brandt is necessarily suitable for percutaneous vascularization. I recommend medical therapy. The sheath was removed and pressure was held on the groin to achieve hemostasis. The patient left the lab in stable condition. She'll be gently dehydrated given her moderate renal insufficiency.  TTE: 06/15/16  Study Conclusions  - Left ventricle: The cavity size was normal. Wall thickness was   increased in a pattern of moderate LVH. There was focal basal   hypertrophy. Systolic function was normal. The estimated ejection   fraction was in the range of 60% to 65%. Features are consistent   with a pseudonormal left ventricular filling pattern, with   concomitant abnormal relaxation and increased filling pressure   (grade 2 diastolic dysfunction). - Mitral valve: There was mild regurgitation. - Left atrium: The atrium was mildly dilated. - Pulmonary arteries: Systolic pressure was mildly increased. PA   peak pressure: 37 mm Hg (S). _____________   History of Present Illness     83 y/o female with PMH including CAD s/p attempted PCI of an occluded RCA in  2003 with subsequent NSTEMI in 2005.  Cath @ that time showed a CTO of the RCA and nonobstructive LAD and LCX dzs.  Last myoview in 2013 was nonischemic.  She was seen in consultation for chest pain @ [facility] in 09/2013.  Echo showed nl EF @ the time and CE were negative.  Med Rx was recommended.  She has not been seen by cardiology since.  Other hx includes HTN, HL, and diast dysfxn.  Earlier this year, she was experiencing vague abd and chest discomfort.  She was seen by GI in Feb and underwent EGD that showed gastritis.  Path showed reactive gastropathy w/ focal erosion.  No metaplasia.  She has since had some improvement in GI Ss with PPI and bentyl therapy.  About 3 wks ago, she began having 'cold-like' symptoms and productive cough.  No fevers/chills.  As a result of coughing, she has also had some pleuritic/msk chest discomfort.  Cough has been improving.  On the evening of 4/14, she was lying in bed @ ~ 11:30 pm, and began to experience severe, retrosternal chest discomfort associated with dyspnea and diaphoresis.  This was different from the pain that she had experienced with cough.  She initially took a tums w/o relief and after about 2 hrs of ongoing Ss, she took a ntg with complete relief in about 5 mins.  She was able to fall asleep for about 20 mins but then awoke with recurrent chest pain, which was again relieved after taking a ntg.  At that point, she asked her grand-daughter to take her to the [facility].  There, ECG showed LBBB (present on 01/2016 ECG as well).  POC troponin was mildly elevated @ 0.10.  K 2.9.  Renal fxn nl.  Mildly anemic (10.8/30.6).  Chest x-ray shows left lung opacity with question of developing consolidation/pneumonia. She was placed on heparin and transferred to [facility] for cardiology evaluation.   Hospital Course     She was admitted with enzymes cycled (noting elevation) and planned for cardiac cath, noted above.   1. Unstable angina with hx of CAD - Troponin  trend 0.64-->0.55-->0.57. In setting of possible pneumonia. TSH normal. - underwent cath with chronically occluded right coronary artery w/ L-R colateral, occluded circumflex and high-grade ostial moderate ramus [facility] disease, 30-40% ulcerated distal left main with a widely patent LAD supplying collaterals to the right .  Ramus felt not amenable to PCI.  Plan for initial medical therapy trial;  Continue ASA, Plavix, atenolol, statin and long acting nitrate.  Had a very mild intermittent episodes of CP during admission but improved with titration of antianginals. Imdur was increased to 90mg  daily.    2. Essential hypertension - continue spironolactone at 12.5 mg daily - continue amlodipine 2.5 mg daily - Continue atenolol at 50 mg bid, which was altered from 100mg  daily.  3. Cough with possible pneumonia - CXR concerning for bronchopneumonia. Given Azithromycin 500mg  4/15, completed 4 days of 250mg  day.   - Cough improved. Breathing is much better.No fever or chills, or WBC.  - No ABX needed at d/c  4. Hypokalemia - Resolved w/ supplement; K today is 3.7.   5. HLD - 06/15/2016: Cholesterol 102; HDL 27; LDL Cholesterol 57; Triglycerides 91; VLDL 18 - Statin changed to Lipitor 80mg . Will need FLP/LFTs in 6 weeks.   6. Anemia  Hgb/H   9.7/27.9>>8.9/26.5 >> 8.7/25.8>>10.2/30.0>>9.4/28.2; No reports of bleeding.  Normocytic indices; not microcytic or macrocytic  - Ferritin, B12, folate are all ok. - Further evaluation per PCP as outpt  Seen by PT this admission who recommended rolling walker and HHPT. Orders placed at discharge. She was seen and assessed by [facility] and determined stable for discharge home. Follow up in the office has been arranged. Medications are listed below.  _____________  Discharge Vitals Blood pressure (!) 145/67, pulse 62, temperature 98.4 F (36.9 C), temperature source Oral, resp. rate 18, height 5' (1.524 m), weight 120 lb (54.4 kg), SpO2 96 %.  Filed  Weights   06/14/16 0406  Weight: 120 lb (54.4 kg)    Labs & Radiologic Studies    CBC  Recent Labs  06/19/16 0323 06/20/16 0333  WBC 8.9 8.2  HGB 10.2* 9.4*  HCT 30.0* 28.2*  MCV 94.0 94.9  PLT 426* 275*   Basic Metabolic Panel  Recent Labs  06/19/16 0323 06/20/16 0333  NA 140 136  K 3.7 3.7  CL 104 104  CO2 23 25  GLUCOSE 116* 114*  BUN 11 9  CREATININE 0.82 0.88  CALCIUM 9.5 8.7*   Liver Function Tests No results for input(s): AST, ALT, ALKPHOS, BILITOT, PROT, ALBUMIN in the last 72 hours. No results for input(s): LIPASE, AMYLASE in the last 72 hours. Cardiac Enzymes No results for input(s): CKTOTAL, CKMB, CKMBINDEX, TROPONINI in the last 72 hours. BNP Invalid input(s): POCBNP D-Dimer No results for input(s): DDIMER in the last 72 hours. Hemoglobin A1C No results for input(s): HGBA1C in the last 72 hours. Fasting Lipid Panel No results for input(s): CHOL, HDL, LDLCALC, TRIG, CHOLHDL, LDLDIRECT in the last 72 hours. Thyroid Function  Tests No results for input(s): TSH, T4TOTAL, T3FREE, THYROIDAB in the last 72 hours.  Invalid input(s): FREET3 _____________  X-ray Chest Pa And Lateral  Result Date: 06/14/2016 CLINICAL DATA:  83 year old female with mid chest pain last night. Initial encounter. EXAM: CHEST  2 VIEW COMPARISON:  Portable chest 0443 hours today and earlier. FINDINGS: Stable cardiomegaly and mediastinal contours. Calcified aortic atherosclerosis. Chronic tortuosity of the thoracic aorta. Stable mediastinal contours. Lung volumes have not significantly changed. There is mild patchy opacity lateral to the left hilum. No pneumothorax, pulmonary edema or pleural effusion. No other acute pulmonary opacity identified. Osteopenia. No acute osseous abnormality identified. Stable cholecystectomy clips. Negative visible bowel gas pattern. IMPRESSION: 1. Patchy left mid lung opacity appears to be acute and is nonspecific. If there is fever or cough consider  bronchopneumonia. 2. No pleural effusion. 3. Stable cardiomegaly.  Calcified aortic atherosclerosis. Electronically Signed   By: Genevie Ann M.D.   On: 06/14/2016 15:38   Dg Chest Port 1 View  Result Date: 06/14/2016 CLINICAL DATA:  Shortness of breath EXAM: PORTABLE CHEST 1 VIEW COMPARISON:  Chest radiograph 10/28/2013 FINDINGS: Unchanged cardiomediastinal contours with atherosclerotic calcification in the aortic arch. There are new opacities in the left mid lung. Medial right basilar opacities are unchanged. IMPRESSION: Near opacities in the left mid lung may indicate developing consolidation, including the possibility of pneumonia. Electronically Signed   By: Ulyses Jarred M.D.   On: 06/14/2016 04:58   Disposition   Pt is being discharged home today in good condition.  Follow-up Plans & Appointments    Follow-up [place], [place]. Go on 07/02/2016.   Specialties:  Physician Assistant, Cardiology Why:  @3 :00 pm for post hospital  Contact information: [place] [address] [phone]        Clark,Katherine Kendal, NP Follow up.   Specialty:  Internal Medicine Why:  please follow up in one week regarding your blood counts.  Contact information: [address] [place] [address] [phone]          Discharge Instructions    Call MD for:  redness, tenderness, or signs of infection (pain, swelling, redness, odor or green/yellow discharge around incision site)    Complete by:  As directed    Diet - low sodium heart healthy    Complete by:  As directed    Discharge instructions    Complete by:  As directed    Groin Site Care Refer to this sheet in the next few weeks. These instructions provide you with information on caring for yourself after your procedure. Your caregiver may also give you more specific instructions. Your treatment has been planned according to current medical practices, but problems sometimes occur. Call your caregiver if  you have any problems or questions after your procedure. HOME CARE INSTRUCTIONS You may shower 24 hours after the procedure. Remove the bandage (dressing) and gently wash the site with plain soap and water. Gently pat the site dry.  Do not apply powder or lotion to the site.  Do not sit in a bathtub, swimming pool, or whirlpool for 5 to 7 days.  No bending, squatting, or lifting anything over 10 pounds (4.5 kg) as directed by your caregiver.  Inspect the site at least twice daily.  Do not drive home if you are discharged the same day of the procedure. Have someone else drive you.  You may drive 24 hours after the procedure unless otherwise instructed  by your caregiver.  What to expect: Any bruising will usually fade within 1 to 2 weeks.  Blood that collects in the tissue (hematoma) may be painful to the touch. It should usually decrease in size and tenderness within 1 to 2 weeks.  SEEK IMMEDIATE MEDICAL CARE IF: You have unusual pain at the groin site or down the affected leg.  You have redness, warmth, swelling, or pain at the groin site.  You have drainage (other than a small amount of blood on the dressing).  You have chills.  You have a fever or persistent symptoms for more than 72 hours.  You have a fever and your symptoms suddenly get worse.  Your leg becomes pale, cool, tingly, or numb.  You have heavy bleeding from the site. Hold pressure on the site. .   Please keep a record of your blood pressure readings at home and bring to your follow up visit, since we changed a number of your home medications.   Face-to-face encounter (required for Medicare/Medicaid patients)    Complete by:  As directed    I Reino Bellis certify that this patient is under my care and that I, or a nurse practitioner or physician's assistant working with me, had a face-to-face encounter that meets the physician face-to-face encounter requirements with this patient on 06/20/2016. The encounter with the  patient was in whole, or in part for the following medical condition(s) which is the primary reason for home health care (List medical condition): Deconditioning, CAD   The encounter with the patient was in whole, or in part, for the following medical condition, which is the primary reason for home health care:  Deconditioning, CAD   I certify that, based on my findings, the following services are medically necessary home health services:  Physical therapy   Reason for Medically Necessary Home Health Services:  Therapy- Personnel officer, Public librarian   My clinical findings support the need for the above services:  Can transfer bed to chair only   Further, I certify that my clinical findings support that this patient is homebound due to:  Unsafe ambulation due to balance issues   Home Health    Complete by:  As directed    To provide the following care/treatments:  PT   Increase activity slowly    Complete by:  As directed    Walker rolling    Complete by:  As directed       Discharge Medications   Current Discharge Medication List    START taking these medications   Details  amLODipine (NORVASC) 2.5 MG tablet Take 1 tablet (2.5 mg total) by mouth every evening. Qty: 30 tablet, Refills: 3    atorvastatin (LIPITOR) 80 MG tablet Take 1 tablet (80 mg total) by mouth daily at 6 PM. Qty: 30 tablet, Refills: 6    clopidogrel (PLAVIX) 75 MG tablet Take 1 tablet (75 mg total) by mouth daily. Qty: 30 tablet, Refills: 6    isosorbide mononitrate (IMDUR) 60 MG 24 hr tablet Take 1.5 tablets (90 mg total) by mouth daily. Qty: 60 tablet, Refills: 3    pantoprazole (PROTONIX) 40 MG tablet Take 1 tablet (40 mg total) by mouth daily. Qty: 30 tablet, Refills: 3      CONTINUE these medications which have CHANGED   Details  atenolol (TENORMIN) 100 MG tablet Take 0.5 tablets (50 mg total) by mouth 2 (two) times daily. Qty: 90 tablet, Refills: 1   Associated Diagnoses:  Essential hypertension    spironolactone (ALDACTONE) 25 MG tablet Take 0.5 tablets (12.5 mg total) by mouth daily. Qty: 30 tablet, Refills: 3      CONTINUE these medications which have NOT CHANGED   Details  alendronate (FOSAMAX) 70 MG tablet Take 70 mg by mouth once a week. Refills: 4    aspirin EC 81 MG tablet Take 81 mg by mouth every morning.    calcium carbonate (CALCIUM 600) 600 MG TABS tablet Take 1,200 mg by mouth daily.    dicyclomine (BENTYL) 10 MG capsule TAKE ONE CAPSULE BY MOUTH TWICE A DAY Qty: 180 capsule, Refills: 1   Associated Diagnoses: Irritable bowel syndrome, unspecified type    MYRBETRIQ 25 MG TB24 tablet Take 25 mg by mouth daily. Refills: 11    nitroGLYCERIN (NITROSTAT) 0.4 MG SL tablet Place 1 tablet (0.4 mg total) under the tongue every 5 (five) minutes as needed for chest pain. Chest Pain Qty: 60 tablet, Refills: 3    sertraline (ZOLOFT) 100 MG tablet TAKE 1 TABLET BY MOUTH EVERY DAY IN THE MORNING Qty: 90 tablet, Refills: 1   Associated Diagnoses: GAD (generalized anxiety disorder)      STOP taking these medications     cloNIDine (CATAPRES) 0.2 MG tablet      omeprazole (PRILOSEC) 40 MG capsule      simvastatin (ZOCOR) 20 MG tablet          Aspirin prescribed at discharge?  Yes High Intensity Statin Prescribed? (Lipitor 40-80mg  or Crestor 20-40mg ): Yes Beta Blocker Prescribed? Yes For EF <40%, was ACEI/ARB Prescribed? No:  ADP Receptor Inhibitor Prescribed? (i.e. Plavix etc.-Includes Medically Managed Patients): Yes For EF <40%, Aldosterone Inhibitor Prescribed? No: EF ok Was EF assessed during THIS hospitalization? Yes Was Cardiac Rehab II ordered? (Included Medically managed Patients): Yes   Outstanding Labs/Studies   FLP/LFTs in 6 weeks.   Duration of Discharge Encounter   Greater than 30 minutes including physician time.  Signed, Reino Bellis NP-C  06/20/2016, 10:08 AM

## 2016-06-17 DIAGNOSIS — I952 Hypotension due to drugs: Secondary | ICD-10-CM

## 2016-06-17 LAB — CBC
HCT: 26.5 % — ABNORMAL LOW (ref 36.0–46.0)
HEMOGLOBIN: 8.9 g/dL — AB (ref 12.0–15.0)
MCH: 31.6 pg (ref 26.0–34.0)
MCHC: 33.6 g/dL (ref 30.0–36.0)
MCV: 94 fL (ref 78.0–100.0)
Platelets: 312 10*3/uL (ref 150–400)
RBC: 2.82 MIL/uL — ABNORMAL LOW (ref 3.87–5.11)
RDW: 12 % (ref 11.5–15.5)
WBC: 7.5 10*3/uL (ref 4.0–10.5)

## 2016-06-17 LAB — BASIC METABOLIC PANEL
Anion gap: 8 (ref 5–15)
BUN: 13 mg/dL (ref 6–20)
CALCIUM: 8.6 mg/dL — AB (ref 8.9–10.3)
CO2: 25 mmol/L (ref 22–32)
CREATININE: 0.8 mg/dL (ref 0.44–1.00)
Chloride: 106 mmol/L (ref 101–111)
GFR calc Af Amer: >60 mL/min (ref >60)
Glucose, Bld: 125 mg/dL — ABNORMAL HIGH (ref 65–99)
Potassium: 3.9 mmol/L (ref 3.5–5.1)
Sodium: 139 mmol/L (ref 135–145)

## 2016-06-17 MED ORDER — MELATONIN 3 MG PO TABS
3.0000 mg | ORAL_TABLET | Freq: Every evening | ORAL | Status: DC | PRN
  Administered 2016-06-17 – 2016-06-19 (×4): 3 mg via ORAL
  Filled 2016-06-17 (×5): qty 1

## 2016-06-17 MED ORDER — ISOSORBIDE MONONITRATE ER 30 MG PO TB24
30.0000 mg | ORAL_TABLET | Freq: Every day | ORAL | Status: DC
  Administered 2016-06-18 – 2016-06-19 (×2): 30 mg via ORAL
  Filled 2016-06-17 (×2): qty 1

## 2016-06-17 MED ORDER — SODIUM CHLORIDE 0.9 % IV BOLUS (SEPSIS)
250.0000 mL | Freq: Once | INTRAVENOUS | Status: AC
  Administered 2016-06-17: 250 mL via INTRAVENOUS

## 2016-06-17 NOTE — Progress Notes (Signed)
PT Cancellation Note  Patient Details Name: BEVERLEE WILMARTH MRN: [id number] DOB: 1931/05/13   Cancelled Treatment:    Reason Eval/Treat Not Completed: Medical issues which prohibited therapy; patient with BP 72/48 and reports dizzy when she gets up.  Will defer due to hypotension and attempt to see tomorrow.     Reginia Naas 06/17/2016, 1:59 PM  Magda Kiel, [place] 06/17/2016

## 2016-06-17 NOTE — Progress Notes (Signed)
Progress Note  Patient Name: [name] Date of Encounter: 06/17/2016  Primary Cardiologist: [place] well overnight no further chest pain.   Inpatient Medications    Scheduled Meds: . aspirin  81 mg Oral Daily  . atenolol  100 mg Oral Daily  . atorvastatin  80 mg Oral q1800  . azithromycin  250 mg Oral Daily  . cloNIDine  0.2 mg Oral BID  . clopidogrel  75 mg Oral Daily  . dicyclomine  10 mg Oral BID  . isosorbide mononitrate  60 mg Oral Daily  . mirabegron ER  25 mg Oral Daily  . pantoprazole  40 mg Oral Daily  . sertraline  100 mg Oral Daily  . sodium chloride flush  3 mL Intravenous Q12H  . spironolactone  25 mg Oral Daily   Continuous Infusions: . sodium chloride     PRN Meds: sodium chloride, acetaminophen, albuterol, hydrALAZINE, ibuprofen, Melatonin, morphine injection, nitroGLYCERIN, ondansetron (ZOFRAN) IV, sodium chloride flush   Vital Signs    Vitals:   06/16/16 1952 06/16/16 2212 06/17/16 0602 06/17/16 1042  BP: (!) 106/52 123/64 138/76 133/86  Pulse: 65 70 64 60  Resp: 18  18   Temp: 98 F (36.7 C)  98.2 F (36.8 C)   TempSrc: Oral  Oral   SpO2: 95%  97%   Weight:      Height:        Intake/Output Summary (Last 24 hours) at 06/17/16 1315 Last data filed at 06/17/16 1246  Gross per 24 hour  Intake              555 ml  Output                0 ml  Net              555 ml   Filed Weights   06/14/16 0406  Weight: 120 lb (54.4 kg)    Telemetry    SR with PACs - Personally Reviewed  ECG    SB with LBBB - Personally Reviewed  Physical Exam   General: Well developed, well nourished, female appearing in no acute distress. Head: Normocephalic, atraumatic.  Neck: Supple without bruits, JVD. Lungs:  Resp regular and unlabored, CTA. Heart: RRR, S1, S2, no S3, S4, or murmur; no rub. Abdomen: Soft, non-tender, non-distended with normoactive bowel sounds. No hepatomegaly. No rebound/guarding. No obvious abdominal  masses. Extremities: No clubbing, cyanosis, edema. Distal pedal pulses are 2+ bilaterally. Neuro: Alert and oriented X 3. Moves all extremities spontaneously. Psych: Normal affect.  Labs    Chemistry Recent Labs Lab 06/14/16 0457 06/15/16 0240 06/16/16 0244 06/17/16 0234  NA  --  138 138 139  K  --  3.5 3.2* 3.9  CL  --  105 108 106  CO2  --  23 23 25   GLUCOSE  --  127* 119* 125*  BUN  --  13 10 13   CREATININE  --  0.73 0.70 0.80  CALCIUM  --  8.6* 8.3* 8.6*  PROT 6.7  --   --   --   ALBUMIN 3.0*  --   --   --   AST 18  --   --   --   ALT 14  --   --   --   ALKPHOS 39  --   --   --   BILITOT 0.5  --   --   --   GFRNONAA  --  >  60 >60 >60  GFRAA  --  >60 >60 >60  ANIONGAP  --  10 7 8      Hematology Recent Labs Lab 06/15/16 0240 06/16/16 0244 06/17/16 0234  WBC 8.5 6.9 7.5  RBC 3.24* 2.97* 2.82*  HGB 10.5* 9.7* 8.9*  HCT 30.4* 27.9* 26.5*  MCV 93.8 93.9 94.0  MCH 32.4 32.7 31.6  MCHC 34.5 34.8 33.6  RDW 12.2 12.2 12.0  PLT 289 308 312    Cardiac Enzymes Recent Labs Lab 06/14/16 0942 06/14/16 1442 06/14/16 2212  TROPONINI 0.64* 0.55* 0.57*    Recent Labs Lab 06/14/16 0414  TROPIPOC 0.10*     BNPNo results for input(s): BNP, PROBNP in the last 168 hours.   DDimer No results for input(s): DDIMER in the last 168 hours.    Radiology    No results found.  Cardiac Studies   Cath: 06/15/16  IMPRESSION per Dr. Dianna Rossetti. [place] had a chronically occluded right coronary artery left Reich lateral, occluded circumflex and high-grade ostial moderate ramus branch disease. She did have 30-40% ulcerated distal left main with a widely patent LAD supplying collaterals to the right. I do not think a ramus branch is necessarily suitable for percutaneous vascularization. I recommend medical therapy. The sheath was removed and pressure was held on the groin to achieve hemostasis. The patient left the lab in stable condition. She'll be gently dehydrated given her  moderate renal insufficiency.  Patient Profile     83 y.o. female h/o CAD (CTO of RCA & nonobs LAD/LCX dzs - 2005), HTN, HL, Diast Dysfxn, hypokalemia, and gastritis (EGD 04/2016), who presents on transfer from [facility] for admission after presentation w/ nitrate responsive chest pain and mild troponin elevation.   Assessment & Plan    1. Unstable angina with hx of CAD - Troponin trend 0.64-->0.55-->0.57. In setting of possible pneumonia. TSH normal. - underwent cath  with results noted above. Added Imdur , no further episodes of chest pain  - PT consult ordered today  2. Essential hypertension - Continue beta blocker, clonidine, and spironolactone. - add Imdur  3. Cough with possible pneumonia - CXR concerning for bronchopneumonia. Given Azithromycin 500mg  4/15, now on 250mg  qd. Plan to dc tomorrow. Cough improved. Breathing is much better. No fever or chills, or WBC. Follow clinically.   4. Hypokalemia - Resolved  5. HLD - 06/15/2016: Cholesterol 102; HDL 27; LDL Cholesterol 57; Triglycerides 91; VLDL 18 - Continue statin.   6. Anemia -- slight drop in Hgb today 9.7>>8.9. No reports of bleeding.  -- follow CBC  Addendum: Noted to have blood pressures 70 systolic. Will hold clonidine, give 250 bolus. Decrease Imdur to 30mg  daily starting tomorrow.  Signed, Reino Bellis, NP  06/17/2016, 1:15 PM    Patient seen and examined. Agree with assessment and plan. PE unchanged today. No JVD or rales, no edema.  BP was low earlier. Agree with dc clonidine. May need to decrease spironolactone to 12.5 mg.  AS BP increases, may then be able to better titrate meds with anti-ischemic benefit in addition to BP control. Continue BB. nitrates and possibly can add amlodipine in future if BP allows. Tolerating the addition of plavix. f/u CBC with  anemia.   Troy Sine, MD, [facility] 06/17/2016 4:19 PM

## 2016-06-18 DIAGNOSIS — D649 Anemia, unspecified: Secondary | ICD-10-CM

## 2016-06-18 LAB — FERRITIN: Ferritin: 336 ng/mL — ABNORMAL HIGH (ref 11–307)

## 2016-06-18 LAB — CBC
HEMATOCRIT: 25.8 % — AB (ref 36.0–46.0)
Hemoglobin: 8.7 g/dL — ABNORMAL LOW (ref 12.0–15.0)
MCH: 31.8 pg (ref 26.0–34.0)
MCHC: 33.7 g/dL (ref 30.0–36.0)
MCV: 94.2 fL (ref 78.0–100.0)
Platelets: 321 10*3/uL (ref 150–400)
RBC: 2.74 MIL/uL — ABNORMAL LOW (ref 3.87–5.11)
RDW: 11.9 % (ref 11.5–15.5)
WBC: 7.5 10*3/uL (ref 4.0–10.5)

## 2016-06-18 LAB — VITAMIN B12: VITAMIN B 12: 425 pg/mL (ref 180–914)

## 2016-06-18 LAB — FOLATE: Folate: 16.6 ng/mL (ref >5.9)

## 2016-06-18 MED ORDER — SPIRONOLACTONE 25 MG PO TABS
12.5000 mg | ORAL_TABLET | Freq: Every day | ORAL | Status: DC
  Administered 2016-06-19 – 2016-06-20 (×2): 12.5 mg via ORAL
  Filled 2016-06-18 (×2): qty 1

## 2016-06-18 NOTE — Care Management Important Message (Signed)
Important Message  Patient Details  Name: Cindy [facility]t MRN: [id number] Date of Birth: August 03, 1931   Medicare Important Message Given:  Yes    Nathen May 06/18/2016, 12:16 PM

## 2016-06-18 NOTE — Evaluation (Addendum)
Physical Therapy Evaluation Patient Details Name: Cindy Brandt MRN: [id number] DOB: [birth year] Today's Date: 06/18/2016   History of Present Illness  Pt adm with NSTEMI. Underwent cath and will follow with medical management. PMH - CAD, HTN  Clinical Impression  Pt admitted with above diagnosis and presents to PT with functional limitations due to deficits listed below (See PT problem list). Pt needs skilled PT to maximize independence and safety to allow discharge to home with intermittent support of family. VSS throughout treatment with improved BP (see flowsheet).  Pt okay for DC home from PT standpoint.    Follow Up Recommendations No PT follow up;Supervision - Intermittent    Equipment Recommendations  Rolling walker with 5" wheels    Recommendations for Other Services       Precautions / Restrictions Precautions Precautions: None Restrictions Weight Bearing Restrictions: No      Mobility  Bed Mobility Overal bed mobility: Modified Independent             General bed mobility comments: incr time  Transfers Overall transfer level: Modified independent Equipment used: Rolling walker (2 wheeled);None             General transfer comment: Incr time  Ambulation/Gait Ambulation/Gait assistance: Supervision Ambulation Distance (Feet): 150 Feet Assistive device: Rolling walker (2 wheeled);None Gait Pattern/deviations: Step-through pattern;Decreased stride length;Narrow base of support Gait velocity: decr Gait velocity interpretation: Below normal speed for age/gender General Gait Details: Pt amb with and without rolling walker. In hallway pt used walker due to open spaces and traffic in halls. In room pt amb without walker without difficulty.  Stairs            Wheelchair Mobility    Modified Rankin (Stroke Patients Only)       Balance Overall balance assessment: Needs assistance Sitting-balance support: No upper extremity supported;Feet  supported Sitting balance-Leahy Scale: Normal     Standing balance support: No upper extremity supported;During functional activity Standing balance-Leahy Scale: Fair                               Pertinent Vitals/Pain Pain Assessment: No/denies pain    Home Living Family/patient expects to be discharged to:: Private residence Living Arrangements: Children Available Help at Discharge: Family;Available PRN/intermittently Type of Home: House Home Access: Stairs to enter Entrance Stairs-Rails: Right Entrance Stairs-Number of Steps: 2-3 Home Layout: One level Home Equipment: None      Prior Function Level of Independence: Independent               Hand Dominance        Extremity/Trunk Assessment   Upper Extremity Assessment Upper Extremity Assessment: Generalized weakness    Lower Extremity Assessment Lower Extremity Assessment: Generalized weakness       Communication   Communication: HOH  Cognition Arousal/Alertness: Awake/alert Behavior During Therapy: WFL for tasks assessed/performed Overall Cognitive Status: Within Functional Limits for tasks assessed                                        General Comments      Exercises     Assessment/Plan    PT Assessment Patient needs continued PT services  PT Problem List Decreased strength;Decreased activity tolerance;Decreased balance;Decreased mobility       PT Treatment Interventions      PT Goals (Current goals  can be found in the Care Plan section)  Acute Rehab PT Goals Patient Stated Goal: return home PT Goal Formulation: With patient Time For Goal Achievement: 06/25/16 Potential to Achieve Goals: Good    Frequency Min 3X/week   Barriers to discharge        Co-evaluation               End of Session   Activity Tolerance: Patient tolerated treatment well Patient left: in chair;with call bell/phone within reach Nurse Communication: Mobility status  (nurse tech) PT Visit Diagnosis: Muscle weakness (generalized) (M62.81);Difficulty in walking, not elsewhere classified (R26.2)    Time: 3785-8850 PT Time Calculation (min) (ACUTE ONLY): 26 min   Charges:   PT Evaluation $PT Eval Moderate Complexity: 1 Procedure PT Treatments $Gait Training: 8-22 mins   PT G Codes:        [facility] [place] 06/18/2016, 11:10 AM

## 2016-06-18 NOTE — Progress Notes (Signed)
Progress Note  Patient Name: Cindy Brandt Date of Encounter: 06/18/2016  Primary Cardiologist: Harl Bowie   Subjective   No recurrent chest pain.   Inpatient Medications    Scheduled Meds: . aspirin  81 mg Oral Daily  . atenolol  100 mg Oral Daily  . atorvastatin  80 mg Oral q1800  . azithromycin  250 mg Oral Daily  . clopidogrel  75 mg Oral Daily  . dicyclomine  10 mg Oral BID  . isosorbide mononitrate  30 mg Oral Daily  . mirabegron ER  25 mg Oral Daily  . pantoprazole  40 mg Oral Daily  . sertraline  100 mg Oral Daily  . sodium chloride flush  3 mL Intravenous Q12H  . [START ON 06/19/2016] spironolactone  12.5 mg Oral Daily   Continuous Infusions: . sodium chloride     PRN Meds: sodium chloride, acetaminophen, albuterol, hydrALAZINE, ibuprofen, Melatonin, morphine injection, nitroGLYCERIN, ondansetron (ZOFRAN) IV, sodium chloride flush   Vital Signs    Vitals:   06/18/16 0202 06/18/16 0848 06/18/16 0851 06/18/16 1006  BP: (!) 146/71 (!) 153/53 (!) 141/73 119/71  Pulse: 77 64  62  Resp: 18     Temp: 98.2 F (36.8 C)     TempSrc: Oral     SpO2: 91%     Weight:      Height:        Intake/Output Summary (Last 24 hours) at 06/18/16 1026 Last data filed at 06/18/16 1006  Gross per 24 hour  Intake              458 ml  Output                0 ml  Net              458 ml   Filed Weights   06/14/16 0406  Weight: 120 lb (54.4 kg)    Telemetry    SR with PACs - Personally Reviewed  ECG    ECG (independently read by me): SB at 58; nonspecific IVCD;   Physical Exam   General: Well developed, well nourished, female appearing in no acute distress. Head: Normocephalic, atraumatic.  Neck: Supple without bruits, JVD. Lungs:  Resp regular and unlabored, CTA. Heart: RRR, S1, S2, no S3, S4, or murmur; no rub. Abdomen: Soft, non-tender, non-distended with normoactive bowel sounds. No hepatomegaly. No rebound/guarding. No obvious abdominal masses. Extremities: No  clubbing, cyanosis, edema. Distal pedal pulses are 2+ bilaterally. Neuro: Alert and oriented X 3. Moves all extremities spontaneously. Psych: Normal affect.  Labs    Chemistry  Recent Labs Lab 06/14/16 0457 06/15/16 0240 06/16/16 0244 06/17/16 0234  NA  --  138 138 139  K  --  3.5 3.2* 3.9  CL  --  105 108 106  CO2  --  23 23 25   GLUCOSE  --  127* 119* 125*  BUN  --  13 10 13   CREATININE  --  0.73 0.70 0.80  CALCIUM  --  8.6* 8.3* 8.6*  PROT 6.7  --   --   --   ALBUMIN 3.0*  --   --   --   AST 18  --   --   --   ALT 14  --   --   --   ALKPHOS 39  --   --   --   BILITOT 0.5  --   --   --   GFRNONAA  --  >60 >  60 >60  GFRAA  --  >60 >60 >60  ANIONGAP  --  10 7 8      Hematology  Recent Labs Lab 06/16/16 0244 06/17/16 0234 06/18/16 0237  WBC 6.9 7.5 7.5  RBC 2.97* 2.82* 2.74*  HGB 9.7* 8.9* 8.7*  HCT 27.9* 26.5* 25.8*  MCV 93.9 94.0 94.2  MCH 32.7 31.6 31.8  MCHC 34.8 33.6 33.7  RDW 12.2 12.0 11.9  PLT 308 312 321    Cardiac Enzymes  Recent Labs Lab 06/14/16 0942 06/14/16 1442 06/14/16 2212  TROPONINI 0.64* 0.55* 0.57*     Recent Labs Lab 06/14/16 0414  TROPIPOC 0.10*     BNPNo results for input(s): BNP, PROBNP in the last 168 hours.   DDimer No results for input(s): DDIMER in the last 168 hours.    Radiology    No results found.  Cardiac Studies   Cath: 06/15/16  IMPRESSION per Dr. Dianna Rossetti. Cindy Brandt had a chronically occluded right coronary artery left Reich lateral, occluded circumflex and high-grade ostial moderate ramus branch disease. She did have 30-40% ulcerated distal left main with a widely patent LAD supplying collaterals to the right. I do not think a ramus branch is necessarily suitable for percutaneous vascularization. I recommend medical therapy. The sheath was removed and pressure was held on the groin to achieve hemostasis. The patient left the lab in stable condition. She'll be gently dehydrated given her moderate renal  insufficiency.  Patient Profile     83 y.o. female h/o CAD (CTO of RCA & nonobs LAD/LCX dzs - 2005), HTN, HL, Diast Dysfxn, hypokalemia, and gastritis (EGD 04/2016), who presents on transfer from [facility] for admission after presentation w/ nitrate responsive chest pain and mild troponin elevation.   Assessment & Plan    1. Unstable angina with hx of CAD - Troponin trend 0.64-->0.55-->0.57. In setting of possible pneumonia. TSH normal. - underwent cath with plan for initial medical therapy trial;  Added Imdur, on atenolol , no further episodes of chest pain    2. Essential hypertension - clonidine was dc'd. Will  decrease spironolactone to 12.5 mg.  AS BP increases, may then be able to better titrate meds with anti-ischemic benefit in addition to BP control.  Will continue Imdur  60 mg.  3. Cough with possible pneumonia - CXR concerning for bronchopneumonia. Given Azithromycin 500mg  4/15, now on 250mg  qd.  Cough improved. Breathing is much better. No fever or chills, or WBC. Follow clinically.   4. Hypokalemia - Resolved; K today is 3.9  5. HLD - 06/15/2016: Cholesterol 102; HDL 27; LDL Cholesterol 57; Triglycerides 91; VLDL 18 - Continue statin.   6. Anemia  Hgb/H   9.7/27.9>>8.9/26.5 >> 8.7/25.8; No reports of bleeding.  Normocytic indices; not microcytic or macrocytic Will check stool, Fe studies, B12, folate to further evaluate and make sure mixed picture is not creating normocytic average.   Possible dc later today if remains stable or tomorrow.       Signed, Shelva Majestic, MD  06/18/2016, 10:26 AM

## 2016-06-19 LAB — BASIC METABOLIC PANEL
Anion gap: 13 (ref 5–15)
BUN: 11 mg/dL (ref 6–20)
CO2: 23 mmol/L (ref 22–32)
Calcium: 9.5 mg/dL (ref 8.9–10.3)
Chloride: 104 mmol/L (ref 101–111)
Creatinine, Ser: 0.82 mg/dL (ref 0.44–1.00)
GFR calc Af Amer: >60 mL/min (ref >60)
Glucose, Bld: 116 mg/dL — ABNORMAL HIGH (ref 65–99)
POTASSIUM: 3.7 mmol/L (ref 3.5–5.1)
SODIUM: 140 mmol/L (ref 135–145)

## 2016-06-19 LAB — CBC
HCT: 30 % — ABNORMAL LOW (ref 36.0–46.0)
Hemoglobin: 10.2 g/dL — ABNORMAL LOW (ref 12.0–15.0)
MCH: 32 pg (ref 26.0–34.0)
MCHC: 34 g/dL (ref 30.0–36.0)
MCV: 94 fL (ref 78.0–100.0)
PLATELETS: 426 10*3/uL — AB (ref 150–400)
RBC: 3.19 MIL/uL — AB (ref 3.87–5.11)
RDW: 11.8 % (ref 11.5–15.5)
WBC: 8.9 10*3/uL (ref 4.0–10.5)

## 2016-06-19 MED ORDER — AMLODIPINE BESYLATE 5 MG PO TABS
2.5000 mg | ORAL_TABLET | Freq: Every evening | ORAL | Status: DC
  Administered 2016-06-19: 2.5 mg via ORAL
  Filled 2016-06-19: qty 1

## 2016-06-19 MED ORDER — ATENOLOL 25 MG PO TABS
50.0000 mg | ORAL_TABLET | Freq: Two times a day (BID) | ORAL | Status: DC
  Administered 2016-06-19 – 2016-06-20 (×2): 50 mg via ORAL
  Filled 2016-06-19 (×2): qty 2

## 2016-06-19 MED ORDER — ISOSORBIDE MONONITRATE ER 60 MG PO TB24: 60.0000 mg | ORAL_TABLET | Freq: Every day | ORAL | Status: DC

## 2016-06-19 MED ORDER — ISOSORBIDE MONONITRATE ER 30 MG PO TB24
30.0000 mg | ORAL_TABLET | Freq: Once | ORAL | Status: AC
  Administered 2016-06-19: 30 mg via ORAL
  Filled 2016-06-19: qty 1

## 2016-06-19 NOTE — Progress Notes (Signed)
Patient was sleeping when I entered room to do my assessment. Daughters concerned that she was drowsy even though patient stated that she was tired. Patient then stated that she was having chest pain. Seems to be anxious, daughters at bedside. Patient given Tylenol for fever earlier. Appeared to be somewhat confused when presented questions. EKG completed. Daughters decided not to stay with her tonight. Will continue to monitor. Bed alarm on

## 2016-06-19 NOTE — Progress Notes (Signed)
Progress Note  Patient Name: Cindy Brandt Date of Encounter: 06/19/2016  Primary Cardiologist: Harl Bowie   Subjective   Had recurrent chest pain last pm, 2 episodes.  No RN notes in chart but nurse today says pt got anxious last pm prior to having CP  Inpatient Medications    Scheduled Meds: . aspirin  81 mg Oral Daily  . atenolol  100 mg Oral Daily  . atorvastatin  80 mg Oral q1800  . azithromycin  250 mg Oral Daily  . clopidogrel  75 mg Oral Daily  . dicyclomine  10 mg Oral BID  . isosorbide mononitrate  30 mg Oral Daily  . mirabegron ER  25 mg Oral Daily  . pantoprazole  40 mg Oral Daily  . sertraline  100 mg Oral Daily  . sodium chloride flush  3 mL Intravenous Q12H  . spironolactone  12.5 mg Oral Daily   Continuous Infusions: . sodium chloride     PRN Meds: sodium chloride, acetaminophen, albuterol, hydrALAZINE, ibuprofen, Melatonin, morphine injection, nitroGLYCERIN, ondansetron (ZOFRAN) IV, sodium chloride flush   Vital Signs    Vitals:   06/18/16 2319 06/19/16 0528 06/19/16 0638 06/19/16 0813  BP: (!) 141/66 (!) 168/73 (!) 147/69 138/64  Pulse: 78 73 80 77  Resp:  18  (!) 24  Temp:  98.9 F (37.2 C)  98.2 F (36.8 C)  TempSrc:  Oral  Oral  SpO2:  98%  98%  Weight:      Height:        Intake/Output Summary (Last 24 hours) at 06/19/16 1104 Last data filed at 06/19/16 1049  Gross per 24 hour  Intake              513 ml  Output                0 ml  Net              513 ml   Filed Weights   06/14/16 0406  Weight: 120 lb (54.4 kg)    Telemetry    SR with PACs - Personally Reviewed  ECG    ECG (independently read by me): SB at 58; nonspecific IVCD;   Physical Exam   General: Well developed, well nourished, female appearing in no acute distress. Head: Normocephalic, atraumatic.  Neck: Supple without bruits, JVD. Lungs:  Resp regular and unlabored, CTA. Heart: RRR, S1, S2, no S3, S4, or murmur; no rub. Abdomen: Soft, non-tender,  non-distended with normoactive bowel sounds. No hepatomegaly. No rebound/guarding. No obvious abdominal masses. Extremities: No clubbing, cyanosis, edema. Distal pedal pulses are 2+ bilaterally. Neuro: Alert and oriented X 3. Moves all extremities spontaneously. Psych: Normal affect.  Labs    Chemistry  Recent Labs Lab 06/14/16 0457  06/16/16 0244 06/17/16 0234 06/19/16 0323  NA  --   < > 138 139 140  K  --   < > 3.2* 3.9 3.7  CL  --   < > 108 106 104  CO2  --   < > 23 25 23   GLUCOSE  --   < > 119* 125* 116*  BUN  --   < > 10 13 11   CREATININE  --   < > 0.70 0.80 0.82  CALCIUM  --   < > 8.3* 8.6* 9.5  PROT 6.7  --   --   --   --   ALBUMIN 3.0*  --   --   --   --  AST 18  --   --   --   --   ALT 14  --   --   --   --   ALKPHOS 39  --   --   --   --   BILITOT 0.5  --   --   --   --   GFRNONAA  --   < > >60 >60 >60  GFRAA  --   < > >60 >60 >60  ANIONGAP  --   < > 7 8 13   < > = values in this interval not displayed.   Hematology  Recent Labs Lab 06/17/16 0234 06/18/16 0237 06/19/16 0323  WBC 7.5 7.5 8.9  RBC 2.82* 2.74* 3.19*  HGB 8.9* 8.7* 10.2*  HCT 26.5* 25.8* 30.0*  MCV 94.0 94.2 94.0  MCH 31.6 31.8 32.0  MCHC 33.6 33.7 34.0  RDW 12.0 11.9 11.8  PLT 312 321 426*    Cardiac Enzymes  Recent Labs Lab 06/14/16 0942 06/14/16 1442 06/14/16 2212  TROPONINI 0.64* 0.55* 0.57*     Recent Labs Lab 06/14/16 0414  TROPIPOC 0.10*   Lab Results  Component Value Date   FERRITIN 336 (H) 06/18/2016   Lab Results  Component Value Date   FOLATE 16.6 06/18/2016   Vitamin B-12  Date Value Ref Range Status  06/18/2016 425 180 - 914 pg/mL Final    Comment:    (NOTE) This assay is not validated for testing neonatal or myeloproliferative syndrome specimens for Vitamin B12 levels.    Radiology    No results found.  Cardiac Studies   Cath: 06/15/16 IMPRESSION per Dr. Dianna Rossetti. Cindy Brandt had a chronically occluded right coronary artery w/ L-R colateral,  occluded circumflex and high-grade ostial moderate ramus branch disease. She did have 30-40% ulcerated distal left main with a widely patent LAD supplying collaterals to the right. I do not think a ramus branch is necessarily suitable for percutaneous vascularization. I recommend medical therapy. The sheath was removed and pressure was held on the groin to achieve hemostasis. The patient left the lab in stable condition. She'll be gently dehydrated given her moderate renal insufficiency.  ECHO: 06/15/2016 - Left ventricle: The cavity size was normal. Wall thickness was   increased in a pattern of moderate LVH. There was focal basal   hypertrophy. Systolic function was normal. The estimated ejection   fraction was in the range of 60% to 65%. Features are consistent   with a pseudonormal left ventricular filling pattern, with   concomitant abnormal relaxation and increased filling pressure   (grade 2 diastolic dysfunction). - Mitral valve: There was mild regurgitation. - Left atrium: The atrium was mildly dilated. - Pulmonary arteries: Systolic pressure was mildly increased. PA   peak pressure: 37 mm Hg (S).  Patient Profile     83 y.o. female h/o CAD (CTO of RCA & nonobs LAD/LCX dzs - 2005), HTN, HL, Diast Dysfxn, hypokalemia, and gastritis (EGD 04/2016), who presents on transfer from [facility] for admission after presentation w/ nitrate responsive chest pain and mild troponin elevation.   Assessment & Plan    1. Unstable angina with hx of CAD - Troponin trend 0.64-->0.55-->0.57. In setting of possible pneumonia. TSH normal. - underwent cath with plan for initial medical therapy trial;  Added Imdur 04/19, on ASA, atenolol, statin, with further episodes of chest pain and sig dz, keep in hospital one more night.  2. Essential hypertension - clonidine was dc'd, spironolactone decreased to 12.5 mg.   -  With additional episodes of CP, increase Imdur back to 60 mg. Consider Ranexa if this does not  work.   - Since off clonidine, BP high enough to add amlodipine 2.5 mg.  - Will change atenolol to 50 mg bid to make for better 24 hr coverage.  3. Cough with possible pneumonia - CXR concerning for bronchopneumonia. Given Azithromycin 500mg  4/15, completed 4 days of 250mg  day.   - Cough improved. Breathing is much better. No fever or chills, or WBC. Follow clinically.  - No ABX needed at d/c  4. Hypokalemia - Resolved w/ supplement; K today is 3.7. If continues to trend down, add oral supplement. - recheck in am  5. HLD - 06/15/2016: Cholesterol 102; HDL 27; LDL Cholesterol 57; Triglycerides 91; VLDL 18 - Continue statin.   6. Anemia  Hgb/H   9.7/27.9>>8.9/26.5 >> 8.7/25.8>>10.2/30.0; No reports of bleeding.  Normocytic indices; not microcytic or macrocytic Will check stool (ordered).  - Ferritin, B12, folate are all ok. -  Further evaluation per PCP.   Possible dc tomorrow if does well overnight.  Signed, Rosaria Ferries, PA-C  06/19/2016, 11:04 AM     Patient seen and examined. Agree with assessment and plan. Ha d some recurrent chest discomfort yesteerdfay associated with anxiety.  Will adjust meds and change atenolol to 50 mg bid, continue with Imdur 60 mg am, and start amldopidine 2.5 mg hs; spironolactone reduced to 12.5 mg daily and clonidine dc'd.  Started on Plavix with ASA this admission after cath. Discussed with daughter; plan dc tomorrow. Would avoid sleeping on second floor when goes to live with her other daughter, after she will stay with the other daughter for the next 2 weeks.    Troy Sine, MD, [facility] 06/19/2016 1:23 PM

## 2016-06-20 DIAGNOSIS — I2583 Coronary atherosclerosis due to lipid rich plaque: Secondary | ICD-10-CM

## 2016-06-20 DIAGNOSIS — D649 Anemia, unspecified: Secondary | ICD-10-CM

## 2016-06-20 DIAGNOSIS — I10 Essential (primary) hypertension: Secondary | ICD-10-CM

## 2016-06-20 DIAGNOSIS — I2510 Atherosclerotic heart disease of native coronary artery without angina pectoris: Secondary | ICD-10-CM

## 2016-06-20 DIAGNOSIS — E785 Hyperlipidemia, unspecified: Secondary | ICD-10-CM

## 2016-06-20 LAB — BASIC METABOLIC PANEL
Anion gap: 7 (ref 5–15)
BUN: 9 mg/dL (ref 6–20)
CO2: 25 mmol/L (ref 22–32)
CREATININE: 0.88 mg/dL (ref 0.44–1.00)
Calcium: 8.7 mg/dL — ABNORMAL LOW (ref 8.9–10.3)
Chloride: 104 mmol/L (ref 101–111)
GFR calc Af Amer: >60 mL/min (ref >60)
GFR calc non Af Amer: 60 mL/min — ABNORMAL LOW (ref >60)
GLUCOSE: 114 mg/dL — AB (ref 65–99)
Potassium: 3.7 mmol/L (ref 3.5–5.1)
Sodium: 136 mmol/L (ref 135–145)

## 2016-06-20 LAB — CBC
HEMATOCRIT: 28.2 % — AB (ref 36.0–46.0)
Hemoglobin: 9.4 g/dL — ABNORMAL LOW (ref 12.0–15.0)
MCH: 31.6 pg (ref 26.0–34.0)
MCHC: 33.3 g/dL (ref 30.0–36.0)
MCV: 94.9 fL (ref 78.0–100.0)
Platelets: 417 10*3/uL — ABNORMAL HIGH (ref 150–400)
RBC: 2.97 MIL/uL — ABNORMAL LOW (ref 3.87–5.11)
RDW: 12.1 % (ref 11.5–15.5)
WBC: 8.2 10*3/uL (ref 4.0–10.5)

## 2016-06-20 MED ORDER — SPIRONOLACTONE 25 MG PO TABS: 12.5000 mg | ORAL_TABLET | Freq: Every day | ORAL | 3 refills | Status: DC

## 2016-06-20 MED ORDER — ATORVASTATIN CALCIUM 80 MG PO TABS: 80.0000 mg | ORAL_TABLET | Freq: Every day | ORAL | 6 refills | Status: DC

## 2016-06-20 MED ORDER — ISOSORBIDE MONONITRATE ER 60 MG PO TB24
90.0000 mg | ORAL_TABLET | Freq: Every day | ORAL | Status: DC
  Administered 2016-06-20: 90 mg via ORAL
  Filled 2016-06-20: qty 1

## 2016-06-20 MED ORDER — ISOSORBIDE MONONITRATE ER 60 MG PO TB24: 90.0000 mg | ORAL_TABLET | Freq: Every day | ORAL | 3 refills | Status: DC

## 2016-06-20 MED ORDER — ATENOLOL 100 MG PO TABS: 50.0000 mg | ORAL_TABLET | Freq: Two times a day (BID) | ORAL | 1 refills | Status: DC

## 2016-06-20 MED ORDER — AMLODIPINE BESYLATE 2.5 MG PO TABS: 2.5000 mg | ORAL_TABLET | Freq: Every evening | ORAL | 3 refills | Status: DC

## 2016-06-20 MED ORDER — PANTOPRAZOLE SODIUM 40 MG PO TBEC: 40.0000 mg | DELAYED_RELEASE_TABLET | Freq: Every day | ORAL | 3 refills | Status: DC

## 2016-06-20 MED ORDER — CLOPIDOGREL BISULFATE 75 MG PO TABS: 75.0000 mg | ORAL_TABLET | Freq: Every day | ORAL | 6 refills | Status: DC

## 2016-06-20 NOTE — Progress Notes (Signed)
Progress Note  Patient Name: Cindy Brandt Date of Encounter: 06/20/2016  Primary Cardiologist: Harl Bowie   Subjective   Had a very mild episode of CP yesterday evening but none since then and feeling good.  She wants to go home.   Inpatient Medications    Scheduled Meds: . amLODipine  2.5 mg Oral QPM  . aspirin  81 mg Oral Daily  . atenolol  50 mg Oral BID  . atorvastatin  80 mg Oral q1800  . azithromycin  250 mg Oral Daily  . clopidogrel  75 mg Oral Daily  . dicyclomine  10 mg Oral BID  . isosorbide mononitrate  60 mg Oral Daily  . mirabegron ER  25 mg Oral Daily  . pantoprazole  40 mg Oral Daily  . sertraline  100 mg Oral Daily  . sodium chloride flush  3 mL Intravenous Q12H  . spironolactone  12.5 mg Oral Daily   Continuous Infusions: . sodium chloride     PRN Meds: sodium chloride, acetaminophen, albuterol, hydrALAZINE, ibuprofen, Melatonin, morphine injection, nitroGLYCERIN, ondansetron (ZOFRAN) IV, sodium chloride flush   Vital Signs    Vitals:   06/19/16 1723 06/19/16 2036 06/20/16 0250 06/20/16 0549  BP: (!) 110/57 133/69  (!) 145/67  Pulse:  88  62  Resp:  20  18  Temp:  (!) 102.2 F (39 C) 98.1 F (36.7 C) 98.4 F (36.9 C)  TempSrc:  Oral Oral Oral  SpO2:  95%  96%  Weight:      Height:        Intake/Output Summary (Last 24 hours) at 06/20/16 0815 Last data filed at 06/19/16 1049  Gross per 24 hour  Intake                3 ml  Output                0 ml  Net                3 ml   Filed Weights   06/14/16 0406  Weight: 120 lb (54.4 kg)    Telemetry    SR with PACs - Personally Reviewed  ECG    No new EKG done  Physical Exam   General: NT ND in NAD Head: benign Neck: no carotid bruits of JVD Lungs:  CTA bilaterally Heart: RRR no M/R/G Abdomen: soft, NT, ND with active BS Extremities: no edema Neuro: A&O x 3 Psych: normal mood  Labs    Chemistry  Recent Labs Lab 06/14/16 0457  06/17/16 0234 06/19/16 0323 06/20/16 0333   NA  --   < > 139 140 136  K  --   < > 3.9 3.7 3.7  CL  --   < > 106 104 104  CO2  --   < > 25 23 25   GLUCOSE  --   < > 125* 116* 114*  BUN  --   < > 13 11 9   CREATININE  --   < > 0.80 0.82 0.88  CALCIUM  --   < > 8.6* 9.5 8.7*  PROT 6.7  --   --   --   --   ALBUMIN 3.0*  --   --   --   --   AST 18  --   --   --   --   ALT 14  --   --   --   --   ALKPHOS 39  --   --   --   --  BILITOT 0.5  --   --   --   --   GFRNONAA  --   < > >60 >60 60*  GFRAA  --   < > >60 >60 >60  ANIONGAP  --   < > 8 13 7   < > = values in this interval not displayed.   Hematology  Recent Labs Lab 06/18/16 0237 06/19/16 0323 06/20/16 0333  WBC 7.5 8.9 8.2  RBC 2.74* 3.19* 2.97*  HGB 8.7* 10.2* 9.4*  HCT 25.8* 30.0* 28.2*  MCV 94.2 94.0 94.9  MCH 31.8 32.0 31.6  MCHC 33.7 34.0 33.3  RDW 11.9 11.8 12.1  PLT 321 426* 417*    Cardiac Enzymes  Recent Labs Lab 06/14/16 0942 06/14/16 1442 06/14/16 2212  TROPONINI 0.64* 0.55* 0.57*     Recent Labs Lab 06/14/16 0414  TROPIPOC 0.10*   Lab Results  Component Value Date   FERRITIN 336 (H) 06/18/2016   Lab Results  Component Value Date   FOLATE 16.6 06/18/2016   Vitamin B-12  Date Value Ref Range Status  06/18/2016 425 180 - 914 pg/mL Final    Comment:    (NOTE) This assay is not validated for testing neonatal or myeloproliferative syndrome specimens for Vitamin B12 levels.    Radiology    No results found.  Cardiac Studies   Cath: 06/15/16 IMPRESSION per Dr. Dianna Rossetti. Pippenger had a chronically occluded right coronary artery w/ L-R colateral, occluded circumflex and high-grade ostial moderate ramus branch disease. She did have 30-40% ulcerated distal left main with a widely patent LAD supplying collaterals to the right. I do not think a ramus branch is necessarily suitable for percutaneous vascularization. I recommend medical therapy. The sheath was removed and pressure was held on the groin to achieve hemostasis. The patient left  the lab in stable condition. She'll be gently dehydrated given her moderate renal insufficiency.  ECHO: 06/15/2016 - Left ventricle: The cavity size was normal. Wall thickness was   increased in a pattern of moderate LVH. There was focal basal   hypertrophy. Systolic function was normal. The estimated ejection   fraction was in the range of 60% to 65%. Features are consistent   with a pseudonormal left ventricular filling pattern, with   concomitant abnormal relaxation and increased filling pressure   (grade 2 diastolic dysfunction). - Mitral valve: There was mild regurgitation. - Left atrium: The atrium was mildly dilated. - Pulmonary arteries: Systolic pressure was mildly increased. PA   peak pressure: 37 mm Hg (S).  Patient Profile     83 y.o. female h/o CAD (CTO of RCA & nonobs LAD/LCX dzs - 2005), HTN, HL, Diast Dysfxn, hypokalemia, and gastritis (EGD 04/2016), who presents on transfer from [facility] for admission after presentation w/ nitrate responsive chest pain and mild troponin elevation.   Assessment & Plan    1. Unstable angina with hx of CAD - Troponin trend 0.64-->0.55-->0.57. In setting of possible pneumonia. TSH normal. - underwent cath with chronically occluded right coronary artery w/ L-R colateral, occluded circumflex and high-grade ostial moderate ramus branch disease, 30-40% ulcerated distal left main with a widely patent LAD supplying collaterals to the right .  Ramus felt not amenable to PCI.  Plan for initial medical therapy trial;  Continue ASA, Plavix, atenolol, statin and long acting nitrate.  Had a very mild episode of CP last night but none since then.  Increase Imdur to 90mg  daily.    2. Essential hypertension - continue spironolactone at 12.5 mg  daily - continue amlodipine 2.5 mg daily - Continue atenolol at 50 mg bid .  3. Cough with possible pneumonia - CXR concerning for bronchopneumonia. Given Azithromycin 500mg  4/15, completed 4 days of 250mg  day.   -  Cough improved. Breathing is much better. No fever or chills, or WBC. Follow clinically.  - No ABX needed at d/c  4. Hypokalemia - Resolved w/ supplement; K today is 3.7.   5. HLD - 06/15/2016: Cholesterol 102; HDL 27; LDL Cholesterol 57; Triglycerides 91; VLDL 18 - Continue statin.   6. Anemia  Hgb/H   9.7/27.9>>8.9/26.5 >> 8.7/25.8>>10.2/30.0>>9.4/28.2; No reports of bleeding.  Normocytic indices; not microcytic or macrocytic  - Ferritin, B12, folate are all ok. -  Further evaluation per PCP as outpt  Patient is stable for discharge home today.  Will have early TOC followup in office.    Signed, Fransico Him, MD  06/20/2016, 8:15 AM

## 2016-06-20 NOTE — Care Management Note (Signed)
Case Management Note  Patient Details  Name: Cindy Brandt MRN: [id number] Date of Birth: [birth year]  Subjective/Objective: 83 y.o. F admitted with NSTEMI. PT eval recommending RW. No HH.                    Action/Plan: Made [facility] aware of DME needs.CM will sign off for now but will be available should additional discharge needs arise or disposition change.    Expected Discharge Date:  06/20/16               Expected Discharge Plan:  Home/Self Care  In-House Referral:     Discharge planning Services  CM Consult  Post Acute Care Choice:  Durable Medical Equipment Choice offered to:  Patient  DME Arranged:  Gilford Rile rolling DME Agency:  [place]:  NA [place] Agency:  NA  Status of Service:  Completed, signed off  If discussed at [place] of Stay Meetings, dates discussed:    Additional Comments:  Delrae Sawyers, RN 06/20/2016, 10:21 AM

## 2016-06-20 NOTE — Progress Notes (Signed)
Discussed with the patient and all questioned fully answered. She will call me if any problems arise. Daughter at bedside at time of discharge teaching. Verbalized understanding of all discharge instructions and follow up appointments.   Pt to wait on Matrice Herro prior to DC. Updated family and case management.  Fritz Pickerel, RN

## 2016-06-22 ENCOUNTER — Telehealth: Payer: Self-pay | Admitting: *Deleted

## 2016-06-22 NOTE — Telephone Encounter (Signed)
Lm with pts daughter, requesting return call to complete TCM and confirm hosp f/u appt

## 2016-07-02 ENCOUNTER — Encounter: Payer: Self-pay | Admitting: Primary Care

## 2016-07-02 ENCOUNTER — Encounter: Payer: Medicare Other | Admitting: Student

## 2016-07-02 ENCOUNTER — Ambulatory Visit (INDEPENDENT_AMBULATORY_CARE_PROVIDER_SITE_OTHER): Payer: Medicare Other | Admitting: Cardiovascular Disease

## 2016-07-02 ENCOUNTER — Ambulatory Visit (INDEPENDENT_AMBULATORY_CARE_PROVIDER_SITE_OTHER)
Admission: RE | Admit: 2016-07-02 | Discharge: 2016-07-02 | Disposition: A | Discharge status: Home / Self Care | Payer: Medicare Other | Source: Ambulatory Visit | Attending: Primary Care | Admitting: Primary Care

## 2016-07-02 ENCOUNTER — Encounter: Payer: Self-pay | Admitting: Cardiovascular Disease

## 2016-07-02 ENCOUNTER — Ambulatory Visit (INDEPENDENT_AMBULATORY_CARE_PROVIDER_SITE_OTHER): Payer: Medicare Other | Admitting: Primary Care

## 2016-07-02 VITALS — BP 122/82 | HR 93 | Temp 97.6°F | Ht 60.0 in | Wt 118.0 lb

## 2016-07-02 DIAGNOSIS — I2510 Atherosclerotic heart disease of native coronary artery without angina pectoris: Secondary | ICD-10-CM | POA: Diagnosis not present

## 2016-07-02 DIAGNOSIS — R05 Cough: Secondary | ICD-10-CM | POA: Diagnosis not present

## 2016-07-02 DIAGNOSIS — J189 Pneumonia, unspecified organism: Secondary | ICD-10-CM | POA: Diagnosis not present

## 2016-07-02 DIAGNOSIS — E7800 Pure hypercholesterolemia, unspecified: Secondary | ICD-10-CM

## 2016-07-02 DIAGNOSIS — F411 Generalized anxiety disorder: Secondary | ICD-10-CM | POA: Diagnosis not present

## 2016-07-02 DIAGNOSIS — I214 Non-ST elevation (NSTEMI) myocardial infarction: Secondary | ICD-10-CM

## 2016-07-02 DIAGNOSIS — F419 Anxiety disorder, unspecified: Secondary | ICD-10-CM

## 2016-07-02 DIAGNOSIS — K219 Gastro-esophageal reflux disease without esophagitis: Secondary | ICD-10-CM

## 2016-07-02 DIAGNOSIS — I10 Essential (primary) hypertension: Secondary | ICD-10-CM

## 2016-07-02 DIAGNOSIS — E785 Hyperlipidemia, unspecified: Secondary | ICD-10-CM | POA: Diagnosis not present

## 2016-07-02 DIAGNOSIS — F329 Major depressive disorder, single episode, unspecified: Secondary | ICD-10-CM

## 2016-07-02 MED ORDER — BUSPIRONE HCL 7.5 MG PO TABS: 7.5000 mg | ORAL_TABLET | Freq: Two times a day (BID) | ORAL | 1 refills | Status: DC

## 2016-07-02 MED ORDER — ATENOLOL 100 MG PO TABS: ORAL_TABLET | ORAL | 6 refills | Status: DC

## 2016-07-02 NOTE — Assessment & Plan Note (Signed)
Admitted to [facility] on June 14, 2016 for chest pain. Cardiac catheterization stable, no percutaneous intervention. 2-D echo with EF of 60-65%, moderate LVH, mild pulmonary artery hypertension. Continue Plavix, atorvastatin, aspirin, Imdur, nitroglycerin as needed. She is establishing with cardiology later this afternoon.

## 2016-07-02 NOTE — Assessment & Plan Note (Signed)
Suspect chest pressure likely related to esophageal reflux. Appreciate cardiology's input on this, she will be seeing her cardiologist later today. Continue pantoprazole 40 mg. Consider adding Zantac 150 at bedtime. Consider GI referral after cardiology evaluation.

## 2016-07-02 NOTE — Progress Notes (Signed)
Pre visit review using our clinic review tool, if applicable. No additional management support is needed unless otherwise documented below in the visit note. 

## 2016-07-02 NOTE — Assessment & Plan Note (Signed)
Stable in the office today. Continue amlodipine, atenolol, Imdur, spironolactone.

## 2016-07-02 NOTE — Assessment & Plan Note (Signed)
History of CAD with known remote stenting of an occluded RCA in 2003. She had non-STEMI in 2005 At that time showed a CTO RCA with nonobstructive disease in her LAD and circumflex. She was admitted on 06/14/16 with a non-STEMI. Cardiac catheterization performed by me in the next day revealed an occluded RCA, occluded circumflex, high-grade ostial ramus branch stenosis of 50% proximal LAD with left to right collaterals. Her EF was normal by 2-D echo with grade 2 diastolic dysfunction. She is on optimal medical therapy. She recently had chest pain last night but does also suffer from anxiety. She is somewhat tachycardic today. I'm going to try to titrate her beta blocker. She is on maximal doses of long-acting oral nitrates.

## 2016-07-02 NOTE — Assessment & Plan Note (Signed)
Anxiety seems uncontrolled. Continue Zoloft 100 mg, add BuSpar 7.5 mg twice a day for anxiety. Will not restart benzodiazepine given age and associated risks for chronic use. Follow-up in 6 weeks for reevaluation of anxiety.

## 2016-07-02 NOTE — Patient Instructions (Signed)
Medication Instructions: Change Atenolol--take 100 mg (1 tab) in the morning and 50 mg (1/2 tab) in the evening.   Follow-Up: We request that you follow-up in: 1 month with an extender and in 6 months with Dr Andria Rhein will receive a reminder letter in the mail two months in advance. If you don't receive a letter, please call our office to schedule the follow-up appointment.  If you need a refill on your cardiac medications before your next appointment, please call your pharmacy.

## 2016-07-02 NOTE — Progress Notes (Signed)
07/02/2016 Cindy Brandt   04-21-1931  [id number]  Primary Physician Sheral Flow, NP Primary Cardiologist: Lorretta Harp MD Renae Gloss  HPI:  83 y/o widowed African-American female mother of 2 children  With a h/o CAD (CTO of RCA & nonobs LAD/LCX dzs - 2005), HTN, HL, Diast Dysfxn, hypokalemia, and gastritis (EGD 04/2016), who presented on transfer from [facility] for admission after presentation w/ nitrate responsive chest pain and mild troponin elevation. I performed cardiac catheterization on her 06/15/16 revealed an occluded RCA with left right collaterals, occluded nondominant circumflex, high-grade ostial ramus branch stenosis and 50% proximal LAD stenosis. She had normal LV function with grade 2 diastolic dysfunction. She is on optimal medical therapy. She does suffer from anxiety. She did have chest pain last night.   Current Outpatient Prescriptions  Medication Sig Dispense Refill  . alendronate (FOSAMAX) 70 MG tablet Take 70 mg by mouth once a week.  4  . amLODipine (NORVASC) 2.5 MG tablet Take 1 tablet (2.5 mg total) by mouth every evening. 30 tablet 3  . aspirin EC 81 MG tablet Take 81 mg by mouth every morning.    Marland Kitchen atenolol (TENORMIN) 100 MG tablet Take 100 mg (1 tab) by mouth in the morning and 50 mg (1/2 tab) in the evening. 135 tablet 6  . atorvastatin (LIPITOR) 80 MG tablet Take 1 tablet (80 mg total) by mouth daily at 6 PM. 30 tablet 6  . busPIRone (BUSPAR) 7.5 MG tablet Take 1 tablet (7.5 mg total) by mouth 2 (two) times daily. 60 tablet 1  . calcium carbonate (CALCIUM 600) 600 MG TABS tablet Take 1,200 mg by mouth daily.    . clopidogrel (PLAVIX) 75 MG tablet Take 1 tablet (75 mg total) by mouth daily. 30 tablet 6  . dicyclomine (BENTYL) 10 MG capsule TAKE ONE CAPSULE BY MOUTH TWICE A DAY (Patient taking differently: TAKE ONE CAPSULE BY MOUTH TWICE A DAY AS NEEDED FOR IBS) 180 capsule 1  . isosorbide mononitrate (IMDUR) 60 MG 24 hr tablet Take 1.5  tablets (90 mg total) by mouth daily. 60 tablet 3  . MYRBETRIQ 25 MG TB24 tablet Take 25 mg by mouth daily.  11  . nitroGLYCERIN (NITROSTAT) 0.4 MG SL tablet Place 1 tablet (0.4 mg total) under the tongue every 5 (five) minutes as needed for chest pain. Chest Pain 60 tablet 3  . pantoprazole (PROTONIX) 40 MG tablet Take 1 tablet (40 mg total) by mouth daily. 30 tablet 3  . sertraline (ZOLOFT) 100 MG tablet TAKE 1 TABLET BY MOUTH EVERY DAY IN THE MORNING 90 tablet 1  . spironolactone (ALDACTONE) 25 MG tablet Take 0.5 tablets (12.5 mg total) by mouth daily. 30 tablet 3   No current facility-administered medications for this visit.     Allergies  Allergen Reactions  . Tylenol [Acetaminophen] Other (See Comments)    Funny feeling in chest.     Social History   Social History  . Marital status: Married    Spouse name: N/A  . Number of children: 5  . Years of education: N/A   Occupational History  . Retired    Social History Main Topics  . Smoking status: Former Smoker    Packs/day: 0.50    Years: 25.00    Types: Cigarettes    Quit date: 04/06/1996  . Smokeless tobacco: Never Used  . Alcohol use No  . Drug use: No  . Sexual activity: Not on file  Other Topics Concern  . Not on file   Social History Narrative   Currently living with dtr in [place].  No regular exercise.     Review of Systems: General: negative for chills, fever, night sweats or weight changes.  Cardiovascular: negative for chest pain, dyspnea on exertion, edema, orthopnea, palpitations, paroxysmal nocturnal dyspnea or shortness of breath Dermatological: negative for rash Respiratory: negative for cough or wheezing Urologic: negative for hematuria Abdominal: negative for nausea, vomiting, diarrhea, bright red blood per rectum, melena, or hematemesis Neurologic: negative for visual changes, syncope, or dizziness All other systems reviewed and are otherwise negative except as noted above.    Blood pressure  108/62, pulse 99, height 5' (1.524 m), weight 118 lb (53.5 kg).  General appearance: alert and no distress Neck: no adenopathy, no carotid bruit, no JVD, supple, symmetrical, trachea midline and thyroid not enlarged, symmetric, no tenderness/mass/nodules Lungs: clear to auscultation bilaterally Heart: regular rate and rhythm, S1, S2 normal, no murmur, click, rub or gallop Extremities: extremities normal, atraumatic, no cyanosis or edema  EKG sinus rhythm at 99 with nonspecific IVCD. I personally reviewed this EKG  ASSESSMENT AND PLAN:   Hyperlipidemia History of hyperlipidemia on statin therapy with lipid profile performed 06/15/16 revealing total cholesterol 102, LDL 57 and HDL of 27.  Essential hypertension History of hypertension or blood pressure measured at 108/62. She is on a beta blocker as well as amlodipine. Continue current meds at current dosing  NSTEMI (non-ST elevated myocardial infarction) [facility]) History of CAD with known remote stenting of an occluded RCA in 2003. She had non-STEMI in 2005 At that time showed a CTO RCA with nonobstructive disease in her LAD and circumflex. She was admitted on 06/14/16 with a non-STEMI. Cardiac catheterization performed by me in the next day revealed an occluded RCA, occluded circumflex, high-grade ostial ramus branch stenosis of 50% proximal LAD with left to right collaterals. Her EF was normal by 2-D echo with grade 2 diastolic dysfunction. She is on optimal medical therapy. She recently had chest pain last night but does also suffer from anxiety. She is somewhat tachycardic today. I'm going to try to titrate her beta blocker. She is on maximal doses of long-acting oral nitrates.      Lorretta Harp MD [place], [facility] 07/02/2016 2:46 PM

## 2016-07-02 NOTE — Progress Notes (Signed)
Subjective:    Patient ID: Cindy Brandt, female    DOB: 1931/04/13, 83 y.o.   MRN: [id number]  HPI  Cindy Brandt is an 83 year old female who presents today for hospital follow up.  She presented to [facility] ED on 06/14/16 with a chief complaint of chest pain. She described her pain as "gas" that woke her from sleep. She took nitroglycerin with improvement, fell back asleep, woke again with pain, took another nitroglycerin and was pain free upon arrival to the emergency department.   During her stay in the ED she underwent ECG (unchanged with LBBB), labs (mild elevation in troponin, otherwise unremarkable), chest xray (early infiltrate). Given her symptoms and presentation she was admitted for concerning ACS and further evaluation.  During her hospital stay she was treated for community acquired pneumonia with oral Zithromax. Her Troponin levels continued to increase which was possibly thought to be secondary to underlying pneumonia. She underwent catheterization on 06/15/16 which showed chronic occluded RCA and circumflex, with 30-40% ulcerated distal left main, patent LAD. There was no intervention with percutaneous vascularization. She also underwent 2D echo which showed EF of 60-65%, moderate LVH, grade 2 diastolic dysfunction, mild PA hypertension.  She was noted to be hypertensive during her stay so Imdur 60 mg was added (later increased to 90 mg), and clonidine was discontinued. Her spironolactone was decreased to 12.5 mg. Her atenolol was increased to 50 mg BID. Amlodipine 2.5 mg was added. She was also initiated on Plavix and switched from simvastatin to atorvastatin, and switched from omeprazole to pantoprazole 40 mg. She was discharged home on 06/19/16.  Since her discharge home she's continued to experience intermittent chest pain with her last episode being last night. She's not taken any of the nitroglycerin since her discharge. She describes her pain as pressure. She will notice a  temporary improvement when she belches, but the pain will return. She noticed her pain when laying down at night. She was up and down between her chair and bed last night. She notes that she ate 3 chicken wings for dinner last night. She has noticed some dizziness in the evening. Her BP in the office today is 120/82. She does not monitor her blood pressure at home.  Her daughter also reports nervousness and anxiety daily that has been ongoing since we weaned/discontinued her Ativan numerous months ago. She is managed on Zoloft 100 mg once daily. She was previously managed on Ativan for which she would take several times daily. GAD 7 score of 12 today. Her daughter believes that some of her chest pain is anxiety related. Her pain will cause increased anxiety and nervousness.  She has continued to cough since her hospitalization. She doesn't feel as though the cough has improved. She denies fevers, chills, productive cough.  She has an appointment scheduled with her cardiologist today at 2 pm in [place].  Review of Systems  Constitutional: Negative for chills, fatigue and fever.  HENT: Negative for congestion.   Eyes: Negative for visual disturbance.  Respiratory: Positive for cough. Negative for shortness of breath.   Cardiovascular: Positive for chest pain.  Gastrointestinal:       Gerd  Neurological: Negative for weakness and headaches.  Psychiatric/Behavioral: Negative for suicidal ideas. The patient is nervous/anxious.        Past Medical History:  Diagnosis Date  . Allergy   . Anemia   . Anxiety   . Arthritis   . Blood transfusion without reported diagnosis   .  Cataract    a. bilerteral cataracts removed  . Coronary atherosclerosis of native coronary artery    a. 2003 Cath/unsuccessful PCI of occluded RCA;  b. 2005 NSTEMI/Cath: CTO RCA w/ L->R collats, LAD 20, LCX 10m, nl EF;  c. 11/2011 Myoview: no ischemia.  . Diastolic dysfunction 31/49/7026   a. 11/2011 Echo: Ejection  fraction 60-65%, gr1 DD, basal inferior AK;  b. 09/2013 Echo: EF 55-60%, mild LVH, no rwma, Gr1 DD, mildly dil LA.  . Gastritis    a. 04/2016 EGD: Nl esophagus, gastritis, nl duodenal bulb & 2nd portion of the duodenum.  Marland Kitchen Headache(784.0)   . Hyperlipidemia   . Hypertension   . Hypokalemia   . Tubular adenoma of colon 2013     Social History   Social History  . Marital status: Married    Spouse name: N/A  . Number of children: 5  . Years of education: N/A   Occupational History  . Retired    Social History Main Topics  . Smoking status: Former Smoker    Packs/day: 0.50    Years: 25.00    Types: Cigarettes    Quit date: 04/06/1996  . Smokeless tobacco: Never Used  . Alcohol use No  . Drug use: No  . Sexual activity: Not on file   Other Topics Concern  . Not on file   Social History Narrative   Currently living with dtr in [place].  No regular exercise.    Past Surgical History:  Procedure Laterality Date  . CARPAL TUNNEL RELEASE Bilateral   . CHOLECYSTECTOMY  2004  . COLONOSCOPY  08/06/2011   Procedure: COLONOSCOPY;  Surgeon: [name], MD;  Location: [facility];  Service: Endoscopy;  Laterality: N/A;  10:40 AM  . GALLBLADDER SURGERY    . LEFT HEART CATH AND CORONARY ANGIOGRAPHY N/A 06/15/2016   Procedure: Left Heart Cath and Coronary Angiography;  Surgeon: Lorretta Harp, MD;  Location: [place];  Service: Cardiovascular;  Laterality: N/A;  . PARTIAL HYSTERECTOMY    . repair of belly button    . UMBILICAL HERNIA REPAIR     Umbilical hernia repair as a child    Family History  Problem Relation Age of Onset  . Coronary artery disease Father   . Heart attack Father   . Colon cancer Father   . Coronary artery disease Mother   . Stomach cancer Mother   . Heart disease Brother   . Diabetes Brother   . Pancreatic cancer Neg Hx   . Rectal cancer Neg Hx   . Esophageal cancer Neg Hx     Allergies  Allergen Reactions  . Tylenol [Acetaminophen] Other  (See Comments)    Funny feeling in chest.     Current Outpatient Prescriptions on File Prior to Visit  Medication Sig Dispense Refill  . alendronate (FOSAMAX) 70 MG tablet Take 70 mg by mouth once a week.  4  . amLODipine (NORVASC) 2.5 MG tablet Take 1 tablet (2.5 mg total) by mouth every evening. 30 tablet 3  . aspirin EC 81 MG tablet Take 81 mg by mouth every morning.    Marland Kitchen atenolol (TENORMIN) 100 MG tablet Take 0.5 tablets (50 mg total) by mouth 2 (two) times daily. 90 tablet 1  . atorvastatin (LIPITOR) 80 MG tablet Take 1 tablet (80 mg total) by mouth daily at 6 PM. 30 tablet 6  . calcium carbonate (CALCIUM 600) 600 MG TABS tablet Take 1,200 mg by mouth daily.    Marland Kitchen  clopidogrel (PLAVIX) 75 MG tablet Take 1 tablet (75 mg total) by mouth daily. 30 tablet 6  . dicyclomine (BENTYL) 10 MG capsule TAKE ONE CAPSULE BY MOUTH TWICE A DAY (Patient taking differently: TAKE ONE CAPSULE BY MOUTH TWICE A DAY AS NEEDED FOR IBS) 180 capsule 1  . isosorbide mononitrate (IMDUR) 60 MG 24 hr tablet Take 1.5 tablets (90 mg total) by mouth daily. 60 tablet 3  . MYRBETRIQ 25 MG TB24 tablet Take 25 mg by mouth daily.  11  . nitroGLYCERIN (NITROSTAT) 0.4 MG SL tablet Place 1 tablet (0.4 mg total) under the tongue every 5 (five) minutes as needed for chest pain. Chest Pain 60 tablet 3  . pantoprazole (PROTONIX) 40 MG tablet Take 1 tablet (40 mg total) by mouth daily. 30 tablet 3  . sertraline (ZOLOFT) 100 MG tablet TAKE 1 TABLET BY MOUTH EVERY DAY IN THE MORNING 90 tablet 1  . spironolactone (ALDACTONE) 25 MG tablet Take 0.5 tablets (12.5 mg total) by mouth daily. 30 tablet 3   No current facility-administered medications on file prior to visit.     BP 122/82   Pulse 93   Temp 97.6 F (36.4 C) (Oral)   Ht 5' (1.524 m)   Wt 118 lb (53.5 kg)   SpO2 97%   BMI 23.05 kg/m    Objective:   Physical Exam  Constitutional: She appears well-nourished.  Neck: Neck supple.  Cardiovascular: Normal rate and regular  rhythm.   Pulmonary/Chest: Effort normal and breath sounds normal.  Skin: Skin is warm and dry.  Psychiatric: She has a normal mood and affect.          Assessment & Plan:  Hospital follow-up:  Admitted to [facility] on 06/14/2016. Treated for continued required pneumonia. Cardiac catheterization completed, no percutaneous intervention. Intermittent chest pressure since discharge home. Suspect symptoms could be a combination of several conditions including uncontrolled esophageal reflux, anxiety, CAD.  Will have her continue pantoprazole 40 mg, consider adding in Zantac 150 at bedtime after cardiology evaluation. Anxiety appears uncontrolled, continue Zoloft 100 mg, add BuSpar 7.5 mg twice a day. Follow-up in 6 weeks for reevaluation of anxiety. Follow-up with cardiologist as scheduled today.   All Hospital notes, labs, imaging reviewed. Sheral Flow, NP

## 2016-07-02 NOTE — Assessment & Plan Note (Signed)
Switched from simvastatin to atorvastatin during recent hospitalization. Recent lipid panel stable.

## 2016-07-02 NOTE — Assessment & Plan Note (Signed)
History of hypertension or blood pressure measured at 108/62. She is on a beta blocker as well as amlodipine. Continue current meds at current dosing

## 2016-07-02 NOTE — Patient Instructions (Signed)
Stop by the front desk and speak with either Rosaria Ferries or Shirlean Mylar regarding your appointment with Cardiology. Please explain your situation.  Complete xray(s) prior to leaving today. I will notify you of your results once received.  Start buspirone (Buspar) 7.5 mg tablets for anxiety. Take 1 tablet by mouth once daily for 1 week, then increase to 1 tablet twice daily thereafter.  Please schedule a follow up appointment in 6 weeks for re-evaluation of anxiety.  It was a pleasure to see you today!

## 2016-07-02 NOTE — Progress Notes (Deleted)
Cardiology Office Note    Date:  07/02/2016   ID:  Cindy Brandt, DOB Mar 01, 1932, MRN [id number]  PCP:  Sheral Flow, NP  Cardiologist: Dr. Harl Bowie   No chief complaint on file.   History of Present Illness:    Cindy Brandt is a 83 y.o. female with past medical history of CAD (known CTO of RCA by cath in 2005 with nonobstructive disease along the LAD and LCx, nonischemic Myoview in 2013), HTN, HLD, and chronic diastolic CHF who presents to the office today for hospital follow-up.   She was recently admitted from 4/15 - 06/20/2016 for evaluation of chest pain and acute dyspnea, found to have an NSTEMI with a peak troponin of 0.64. Was transferred from [facility] to [facility] for further evaluation. Echocardiogram was performed and showed an EF of 60% to 65%, Grade 2 DD, and mildly increased PA pressure. With her presenting symptoms, elevated cardiac enzymes, and known CAD, a cardiac catheterization was recommended. This was performed on 4/16 and showed her known 100% mid-RCA occlusion, Ost Cx 100% stenosis, 45% Ost LM lesion, 95% Ost Ramus lesion, and 50% Prox LAD to Mid LAD lesion. Continued medical therapy was recommended. She continued to have episodes of chest pain following the procedure, therefore she was started on Plavix and Imdur. She became hypotensive with SBP in the 70's on 4/18 and required administration of IVF. Clonidine was discontinued and Spironolactone was decreased from 25mg  daily to 12.5mg  daily. She did have a recurrent episode of chest pain which was associated with anxiety. Amlodipine 2.5mg  daily was initiated. She was also treated for possible PNA with Azithromycin as CXR on admission showed possible bronchopneumonia. She was discharged on ASA 81mg  daily, Plavix 75mg  daily, Amlodipine 2.5mg  daily, Lipitor 80mg  daily, Imdur 90mg  daily, Atenolol 50mg  BID, and Spironolactone 12.5mg  daily.    Past Medical History:  Diagnosis Date  . Allergy   . Anemia   . Anxiety     . Arthritis   . Blood transfusion without reported diagnosis   . Cataract    a. bilerteral cataracts removed  . Coronary atherosclerosis of native coronary artery    a. 2003 Cath/unsuccessful PCI of occluded RCA;  b. 2005 NSTEMI/Cath: CTO RCA w/ L->R collats, LAD 20, LCX 13m, nl EF;  c. 11/2011 Myoview: no ischemia.  . Diastolic dysfunction 40/98/1191   a. 11/2011 Echo: Ejection fraction 60-65%, gr1 DD, basal inferior AK;  b. 09/2013 Echo: EF 55-60%, mild LVH, no rwma, Gr1 DD, mildly dil LA.  . Gastritis    a. 04/2016 EGD: Nl esophagus, gastritis, nl duodenal bulb & 2nd portion of the duodenum.  Marland Kitchen Headache(784.0)   . Hyperlipidemia   . Hypertension   . Hypokalemia   . Tubular adenoma of colon 2013    Past Surgical History:  Procedure Laterality Date  . CARPAL TUNNEL RELEASE Bilateral   . CHOLECYSTECTOMY  2004  . COLONOSCOPY  08/06/2011   Procedure: COLONOSCOPY;  Surgeon: [name], MD;  Location: [facility];  Service: Endoscopy;  Laterality: N/A;  10:40 AM  . GALLBLADDER SURGERY    . LEFT HEART CATH AND CORONARY ANGIOGRAPHY N/A 06/15/2016   Procedure: Left Heart Cath and Coronary Angiography;  Surgeon: Lorretta Harp, MD;  Location: [facility];  Service: Cardiovascular;  Laterality: N/A;  . PARTIAL HYSTERECTOMY    . repair of belly button    . UMBILICAL HERNIA REPAIR     Umbilical hernia repair as a child  Current Medications: Outpatient Medications Prior to Visit  Medication Sig Dispense Refill  . alendronate (FOSAMAX) 70 MG tablet Take 70 mg by mouth once a week.  4  . amLODipine (NORVASC) 2.5 MG tablet Take 1 tablet (2.5 mg total) by mouth every evening. 30 tablet 3  . aspirin EC 81 MG tablet Take 81 mg by mouth every morning.    Marland Kitchen atenolol (TENORMIN) 100 MG tablet Take 0.5 tablets (50 mg total) by mouth 2 (two) times daily. 90 tablet 1  . atorvastatin (LIPITOR) 80 MG tablet Take 1 tablet (80 mg total) by mouth daily at 6 PM. 30 tablet 6  . calcium carbonate  (CALCIUM 600) 600 MG TABS tablet Take 1,200 mg by mouth daily.    . clopidogrel (PLAVIX) 75 MG tablet Take 1 tablet (75 mg total) by mouth daily. 30 tablet 6  . dicyclomine (BENTYL) 10 MG capsule TAKE ONE CAPSULE BY MOUTH TWICE A DAY (Patient taking differently: TAKE ONE CAPSULE BY MOUTH TWICE A DAY AS NEEDED FOR IBS) 180 capsule 1  . isosorbide mononitrate (IMDUR) 60 MG 24 hr tablet Take 1.5 tablets (90 mg total) by mouth daily. 60 tablet 3  . MYRBETRIQ 25 MG TB24 tablet Take 25 mg by mouth daily.  11  . nitroGLYCERIN (NITROSTAT) 0.4 MG SL tablet Place 1 tablet (0.4 mg total) under the tongue every 5 (five) minutes as needed for chest pain. Chest Pain 60 tablet 3  . pantoprazole (PROTONIX) 40 MG tablet Take 1 tablet (40 mg total) by mouth daily. 30 tablet 3  . sertraline (ZOLOFT) 100 MG tablet TAKE 1 TABLET BY MOUTH EVERY DAY IN THE MORNING 90 tablet 1  . spironolactone (ALDACTONE) 25 MG tablet Take 0.5 tablets (12.5 mg total) by mouth daily. 30 tablet 3   No facility-administered medications prior to visit.      Allergies:   Tylenol [acetaminophen]   Social History   Social History  . Marital status: Married    Spouse name: N/A  . Number of children: 5  . Years of education: N/A   Occupational History  . Retired    Social History Main Topics  . Smoking status: Former Smoker    Packs/day: 0.50    Years: 25.00    Types: Cigarettes    Quit date: 04/06/1996  . Smokeless tobacco: Never Used  . Alcohol use No  . Drug use: No  . Sexual activity: Not on file   Other Topics Concern  . Not on file   Social History Narrative   Currently living with dtr in [place].  No regular exercise.     Family History:  The patient's ***family history includes Colon cancer in her father; Coronary artery disease in her father and mother; Diabetes in her brother; Heart attack in her father; Heart disease in her brother; Stomach cancer in her mother.   Review of Systems:   Please see the history of  present illness.     General:  No chills, fever, night sweats or weight changes.  Cardiovascular:  No chest pain, dyspnea on exertion, edema, orthopnea, palpitations, paroxysmal nocturnal dyspnea. Dermatological: No rash, lesions/masses Respiratory: No cough, dyspnea Urologic: No hematuria, dysuria Abdominal:   No nausea, vomiting, diarrhea, bright red blood per rectum, melena, or hematemesis Neurologic:  No visual changes, wkns, changes in mental status. All other systems reviewed and are otherwise negative except as noted above.   Physical Exam:    VS:  There were no vitals taken for this visit.  General: Well developed, well nourished,female appearing in no acute distress. Head: Normocephalic, atraumatic, sclera non-icteric, no xanthomas, nares are without discharge.  Neck: No carotid bruits. JVD not elevated.  Lungs: Respirations regular and unlabored, without wheezes or rales.  Heart: ***Regular rate and rhythm. No S3 or S4.  No murmur, no rubs, or gallops appreciated. Abdomen: Soft, non-tender, non-distended with normoactive bowel sounds. No hepatomegaly. No rebound/guarding. No obvious abdominal masses. Msk:  Strength and tone appear normal for age. No joint deformities or effusions. Extremities: No clubbing or cyanosis. No edema.  Distal pedal pulses are 2+ bilaterally. Neuro: Alert and oriented X 3. Moves all extremities spontaneously. No focal deficits noted. Psych:  Responds to questions appropriately with a normal affect. Skin: No rashes or lesions noted  Wt Readings from Last 3 Encounters:  06/14/16 120 lb (54.4 kg)  04/09/16 125 lb (56.7 kg)  04/06/16 125 lb (56.7 kg)        Studies/Labs Reviewed:   EKG:  EKG is*** ordered today.  The ekg ordered today demonstrates ***  Recent Labs: 06/14/2016: ALT 14; TSH 1.229 06/20/2016: BUN 9; Creatinine, Ser 0.88; Hemoglobin 9.4; Platelets 417; Potassium 3.7; Sodium 136   Lipid Panel    Component Value Date/Time   CHOL  102 06/15/2016 0240   TRIG 91 06/15/2016 0240   HDL 27 (L) 06/15/2016 0240   CHOLHDL 3.8 06/15/2016 0240   VLDL 18 06/15/2016 0240   LDLCALC 57 06/15/2016 0240    Additional studies/ records that were reviewed today include:   Echocardiogram: 06/15/2016 Study Conclusions  - Left ventricle: The cavity size was normal. Wall thickness was   increased in a pattern of moderate LVH. There was focal basal   hypertrophy. Systolic function was normal. The estimated ejection   fraction was in the range of 60% to 65%. Features are consistent   with a pseudonormal left ventricular filling pattern, with   concomitant abnormal relaxation and increased filling pressure   (grade 2 diastolic dysfunction). - Mitral valve: There was mild regurgitation. - Left atrium: The atrium was mildly dilated. - Pulmonary arteries: Systolic pressure was mildly increased. PA   peak pressure: 37 mm Hg (S).  Cardiac Catheterization: 06/15/2016  Mid RCA lesion, 100 %stenosed.  Ost LM lesion, 45 %stenosed.  Ost Cx to Prox Cx lesion, 100 %stenosed.  Ost Ramus lesion, 95 %stenosed.  Prox LAD to Mid LAD lesion, 50 %stenosed.  IMPRESSION: Ms. Venard had a chronically occluded right coronary artery left Reich lateral, occluded circumflex and high-grade ostial moderate ramus branch disease. She did have 30-40% ulcerated distal left main with a widely patent LAD supplying collaterals to the right. I do not think a ramus branch is necessarily suitable for percutaneous vascularization. I recommend medical therapy. The sheath was removed and pressure was held on the groin to achieve hemostasis. The patient left the lab in stable condition. She'll be gently dehydrated given her moderate renal insufficiency.  Assessment:    No diagnosis found.   Plan:   In order of problems listed above:  1. ***    Medication Adjustments/Labs and Tests Ordered: Current medicines are reviewed at length with the patient today.   Concerns regarding medicines are outlined above.  Medication changes, Labs and Tests ordered today are listed in the Patient Instructions below. There are no Patient Instructions on file for this visit.   Signed, Erma Heritage, PA-C  07/02/2016 10:30 AM    [address], [address]  [place], [place]  [address] Phone: [phone]; Fax: [phone]  [address], [place], [place] [address] Phone: [phone]

## 2016-07-02 NOTE — Assessment & Plan Note (Signed)
History of hyperlipidemia on statin therapy with lipid profile performed 06/15/16 revealing total cholesterol 102, LDL 57 and HDL of 27.

## 2016-08-03 ENCOUNTER — Ambulatory Visit (INDEPENDENT_AMBULATORY_CARE_PROVIDER_SITE_OTHER): Payer: Medicare Other | Admitting: Cardiology

## 2016-08-03 ENCOUNTER — Encounter: Payer: Self-pay | Admitting: Cardiology

## 2016-08-03 DIAGNOSIS — I2510 Atherosclerotic heart disease of native coronary artery without angina pectoris: Secondary | ICD-10-CM | POA: Diagnosis not present

## 2016-08-03 DIAGNOSIS — E784 Other hyperlipidemia: Secondary | ICD-10-CM

## 2016-08-03 DIAGNOSIS — I10 Essential (primary) hypertension: Secondary | ICD-10-CM

## 2016-08-03 DIAGNOSIS — I519 Heart disease, unspecified: Secondary | ICD-10-CM | POA: Diagnosis not present

## 2016-08-03 DIAGNOSIS — I5189 Other ill-defined heart diseases: Secondary | ICD-10-CM

## 2016-08-03 DIAGNOSIS — E7849 Other hyperlipidemia: Secondary | ICD-10-CM

## 2016-08-03 MED ORDER — NITROGLYCERIN 0.4 MG SL SUBL: 0.4000 mg | SUBLINGUAL_TABLET | SUBLINGUAL | 11 refills | Status: DC | PRN

## 2016-08-03 NOTE — Assessment & Plan Note (Signed)
Controlled.  

## 2016-08-03 NOTE — Patient Instructions (Addendum)
Medication Instructions:  Use your NTG under your tongue for recurrent chest pain. May take one tablet every 5 minutes. If you are still having discomfort after 3 tablets in 15 minutes, call 911. THIS [facility] BEEN SENT [facility]  Labwork: NONE  Testing/Procedures: NONE  Follow-Up: Your physician recommends that you schedule a follow-up appointment in: [place]  If you need a refill on your cardiac medications before your next appointment, please call your pharmacy.

## 2016-08-03 NOTE — Assessment & Plan Note (Signed)
Grade 2 DD 

## 2016-08-03 NOTE — Progress Notes (Signed)
08/03/2016 Cindy Brandt   [birth year]  [id number]  Primary Physician [name], NP Primary Cardiologist: Dr Gwenlyn Found  HPI:  83 y/o widowed African-American female mother of 2 children  With a h/o CAD, HTN, HL, Diast Dysfxn, hypokalemia, and gastritis (EGD 04/2016), who was sent to [facility] from  [facility] in April 2018 for chest pain and mild troponin elevation. Cardiac catheterization 06/15/16 revealed an occluded RCA with left right collaterals, occluded nondominant circumflex, high-grade ostial ramus branch stenosis and 50% proximal LAD stenosis. She had normal LV function with grade 2 diastolic dysfunction. Dr Gwenlyn Found saw her in f/u 07/02/16. She was having some chest pain. He increased her Tenormin to 100 mg am, 50 mg PM. She is in the office today for follow up. Her daughter accompanied her. The pt is doing better, she can pretty much do what she wants without chest pain. She gets some epigastric discomfort after eating certain foods but her daughter thinks this is GERD and I agree.    Current Outpatient Prescriptions  Medication Sig Dispense Refill  . alendronate (FOSAMAX) 70 MG tablet Take 70 mg by mouth once a week.  4  . amLODipine (NORVASC) 2.5 MG tablet Take 1 tablet (2.5 mg total) by mouth every evening. 30 tablet 3  . aspirin EC 81 MG tablet Take 81 mg by mouth every morning.    Marland Kitchen atenolol (TENORMIN) 100 MG tablet Take 100 mg (1 tab) by mouth in the morning and 50 mg (1/2 tab) in the evening. 135 tablet 6  . atorvastatin (LIPITOR) 80 MG tablet Take 1 tablet (80 mg total) by mouth daily at 6 PM. 30 tablet 6  . busPIRone (BUSPAR) 7.5 MG tablet Take 1 tablet (7.5 mg total) by mouth 2 (two) times daily. 60 tablet 1  . calcium carbonate (CALCIUM 600) 600 MG TABS tablet Take 1,200 mg by mouth daily.    . clopidogrel (PLAVIX) 75 MG tablet Take 1 tablet (75 mg total) by mouth daily. 30 tablet 6  . dicyclomine (BENTYL) 10 MG capsule TAKE ONE CAPSULE BY MOUTH TWICE A DAY (Patient taking differently:  TAKE ONE CAPSULE BY MOUTH TWICE A DAY AS NEEDED FOR IBS) 180 capsule 1  . isosorbide mononitrate (IMDUR) 60 MG 24 hr tablet Take 1.5 tablets (90 mg total) by mouth daily. 60 tablet 3  . loratadine (CLARITIN) 10 MG tablet Take 10 mg by mouth daily.    Marland Kitchen MYRBETRIQ 25 MG TB24 tablet Take 25 mg by mouth daily.  11  . nitroGLYCERIN (NITROSTAT) 0.4 MG SL tablet Place 1 tablet (0.4 mg total) under the tongue every 5 (five) minutes as needed for chest pain. Chest Pain 25 tablet 11  . pantoprazole (PROTONIX) 40 MG tablet Take 1 tablet (40 mg total) by mouth daily. 30 tablet 3  . sertraline (ZOLOFT) 100 MG tablet TAKE 1 TABLET BY MOUTH EVERY DAY IN THE MORNING 90 tablet 1  . spironolactone (ALDACTONE) 25 MG tablet Take 0.5 tablets (12.5 mg total) by mouth daily. 30 tablet 3   No current facility-administered medications for this visit.     Allergies  Allergen Reactions  . Tylenol [Acetaminophen] Other (See Comments)    Funny feeling in chest.     Past Medical History:  Diagnosis Date  . Allergy   . Anemia   . Anxiety   . Arthritis   . Blood transfusion without reported diagnosis   . Cataract    a. bilerteral cataracts removed  . Coronary atherosclerosis of  native coronary artery    a. 2003 Cath/unsuccessful PCI of occluded RCA;  b. 2005 NSTEMI/Cath: CTO RCA w/ L->R collats, LAD 20, LCX 81m, nl EF;  c. 11/2011 Myoview: no ischemia.  . Diastolic dysfunction 67/34/1937   a. 11/2011 Echo: Ejection fraction 60-65%, gr1 DD, basal inferior AK;  b. 09/2013 Echo: EF 55-60%, mild LVH, no rwma, Gr1 DD, mildly dil LA.  . Gastritis    a. 04/2016 EGD: Nl esophagus, gastritis, nl duodenal bulb & 2nd portion of the duodenum.  Marland Kitchen Headache(784.0)   . Hyperlipidemia   . Hypertension   . Hypokalemia   . Tubular adenoma of colon 2013    Social History   Social History  . Marital status: Married    Spouse name: N/A  . Number of children: 5  . Years of education: N/A   Occupational History  . Retired     Social History Main Topics  . Smoking status: Former Smoker    Packs/day: 0.50    Years: 25.00    Types: Cigarettes    Quit date: 04/06/1996  . Smokeless tobacco: Never Used  . Alcohol use No  . Drug use: No  . Sexual activity: Not on file   Other Topics Concern  . Not on file   Social History Narrative   Currently living with dtr in [place].  No regular exercise.     Family History  Problem Relation Age of Onset  . Coronary artery disease Father   . Heart attack Father   . Colon cancer Father   . Coronary artery disease Mother   . Stomach cancer Mother   . Heart disease Brother   . Diabetes Brother   . Pancreatic cancer Neg Hx   . Rectal cancer Neg Hx   . Esophageal cancer Neg Hx      Review of Systems: General: negative for chills, fever, night sweats or weight changes.  Cardiovascular: negative for chest pain, dyspnea on exertion, edema, orthopnea, palpitations, paroxysmal nocturnal dyspnea or shortness of breath Dermatological: negative for rash Respiratory: negative for cough or wheezing Urologic: negative for hematuria Abdominal: negative for nausea, vomiting, diarrhea, bright red blood per rectum, melena, or hematemesis Neurologic: negative for visual changes, syncope, or dizziness All other systems reviewed and are otherwise negative except as noted above.    Blood pressure 122/66, pulse 64, height 5' (1.524 m), weight 119 lb (54 kg).  General appearance: alert, cooperative, appears stated age and no distress Lungs: clear to auscultation bilaterally Heart: regular rate and rhythm Skin: Skin color, texture, turgor normal. No rashes or lesions Neurologic: Grossly normal   ASSESSMENT AND PLAN:   Diastolic dysfunction Grade 2 DD  Hyperlipidemia On high dose statin Rx  Essential hypertension Controlled  CAD (coronary atherosclerotic disease) Cath April 2018- occluded RCA, occluded non dominant CFX, high grade RI, 50% LAD with L-R collaterals Normal  LVF. Plan is medical Rx   PLAN  I discussed symptoms of angina with the pt and daughter. I think we still have some options for medical Rx if need (Ranexa, or higher dose of Imdur) but she seems to be doing well. F/U with Dr berry 3 months.   Kerin Ransom PA-C 08/03/2016 2:32 PM

## 2016-08-03 NOTE — Assessment & Plan Note (Signed)
On high dose statin Rx 

## 2016-08-03 NOTE — Assessment & Plan Note (Signed)
Cath April 2018- occluded RCA, occluded non dominant CFX, high grade RI, 50% LAD with L-R collaterals Normal LVF. Plan is medical Rx

## 2016-08-27 ENCOUNTER — Other Ambulatory Visit: Payer: Self-pay | Admitting: Primary Care

## 2016-08-27 DIAGNOSIS — F411 Generalized anxiety disorder: Secondary | ICD-10-CM

## 2016-09-30 ENCOUNTER — Other Ambulatory Visit: Payer: Self-pay | Admitting: Primary Care

## 2016-09-30 DIAGNOSIS — K589 Irritable bowel syndrome without diarrhea: Secondary | ICD-10-CM

## 2016-09-30 MED ORDER — DICYCLOMINE HCL 10 MG PO CAPS: ORAL_CAPSULE | ORAL | 1 refills | Status: DC

## 2016-09-30 NOTE — Telephone Encounter (Signed)
Does she really need a refill of her dicyclomine? This is an "as needed" medication. How often is she taking it?

## 2016-09-30 NOTE — Telephone Encounter (Signed)
Noted, Rx sent to pharmacy. 

## 2016-09-30 NOTE — Telephone Encounter (Signed)
Spoken to patient's daughter Dewaine Oats) and she stated that patient is taking dicyclomine every day now. Daughter stated that when they saw GI, they were told for patient to take this every day instead of as needed since patient was having problems still.  Daughter was told to request refill from [facility]. Patient is taking 1 capsule two times a day.

## 2016-09-30 NOTE — Telephone Encounter (Signed)
Ok to refill? Electronically refill request for dicyclomine (BENTYL) 10 MG capsule  Last prescribed on 04/09/2016. Last seen on 07/02/2016

## 2016-09-30 NOTE — Telephone Encounter (Signed)
Message left for patient to return my call.  

## 2016-10-10 IMAGING — DX DG SHOULDER 2+V*L*
3 series · 3 of 3 positions shown · non-contrast
Comparison: None.

CLINICAL DATA: 84-year-old presenting with left shoulder pain after
a fall 2 weeks ago. Initial imaging encounter.

EXAM:
LEFT SHOULDER - 2+ VIEW

[shoulder axial]
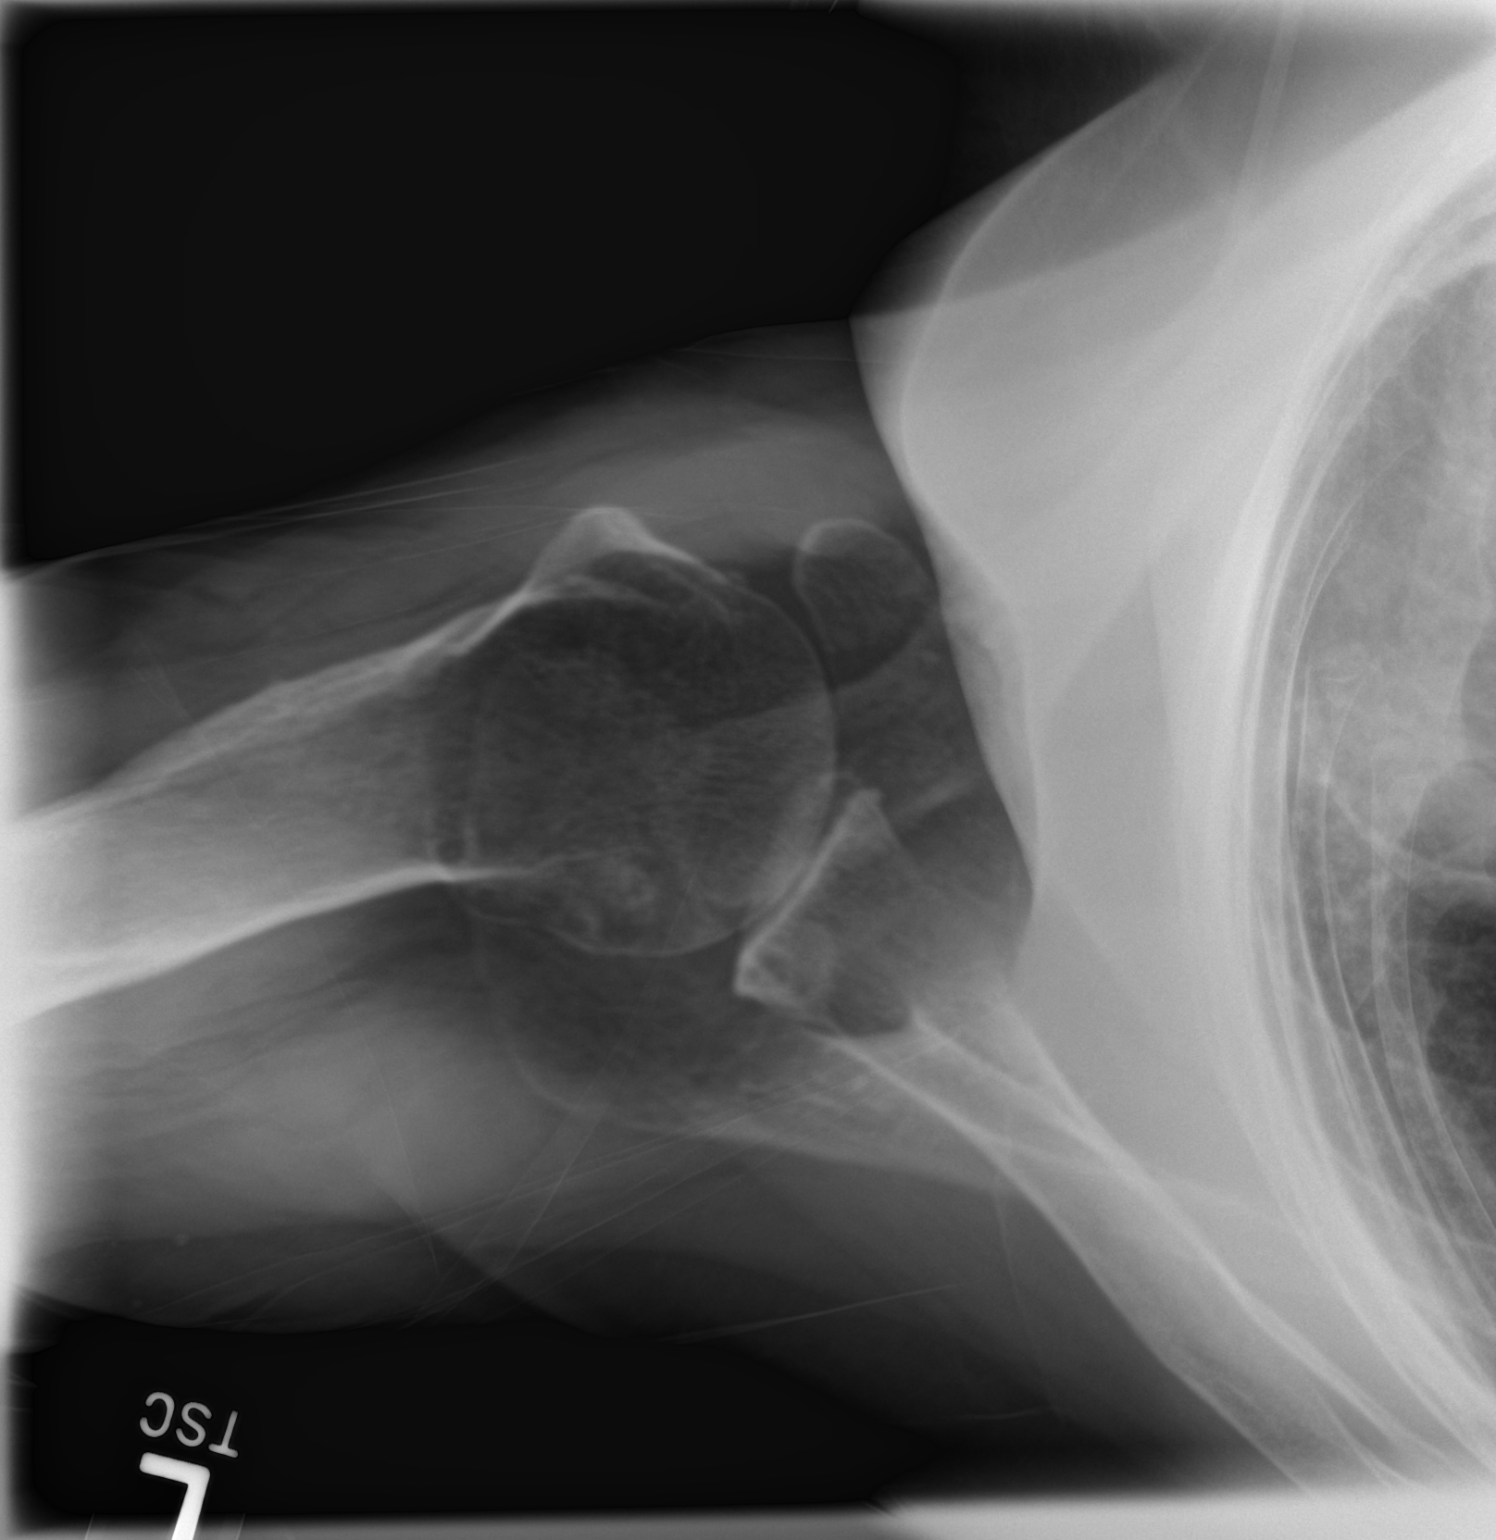

[shoulder y-view]
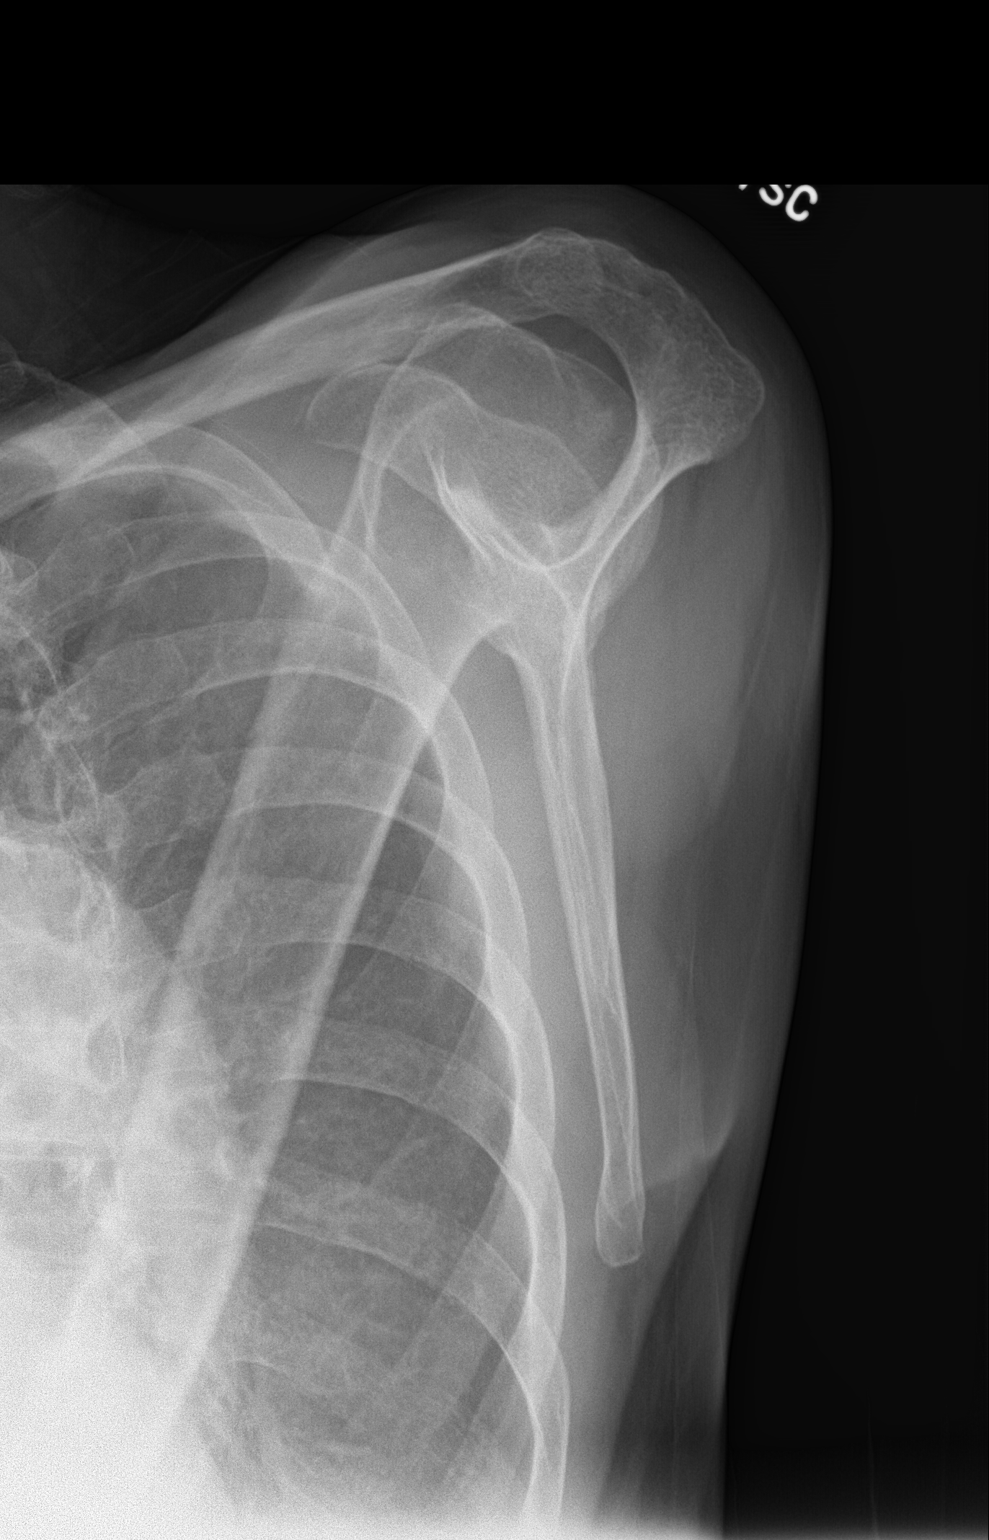

[shoulder ap]
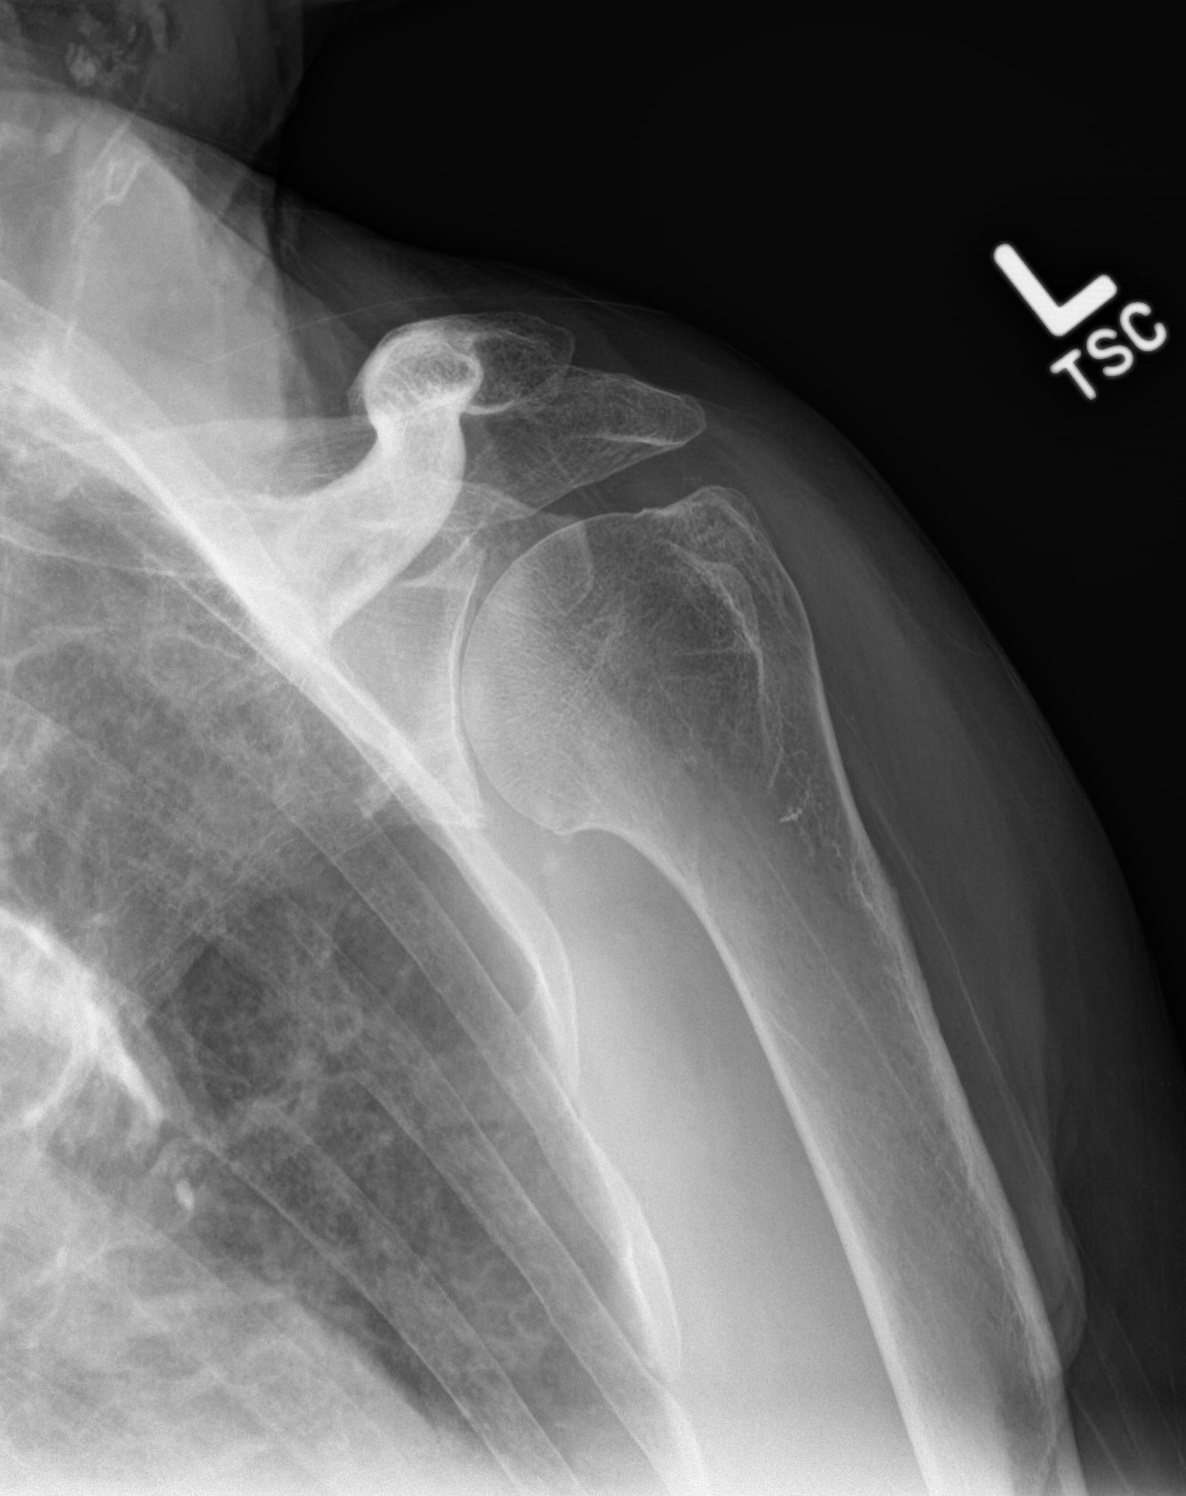

[3 of 3 positions shown; findings below may reference images not displayed]

FINDINGS: No evidence of acute or subacute fracture or glenohumeral
dislocation. Moderate to severe narrowing of the glenohumeral joint
space. Calcification within the supraspinatus tendon near its
insertion on the greater tuberosity. Subacromial space
well-preserved. Acromioclavicular joint intact.
IMPRESSION: Moderate to severe osteoarthritis involving glenohumeral joint.
Chronic calcific supraspinatus tendinitis. No acute or subacute
osseous abnormality.

## 2016-10-10 IMAGING — DX DG SHOULDER 2+V*R*
3 series · 3 of 3 positions shown · non-contrast
Comparison: None.

CLINICAL DATA: 84-year-old presenting with right shoulder pain
after a fall 2 months ago. Initial imaging encounter.

EXAM:
RIGHT SHOULDER - 2+ VIEW

[shoulder axial]
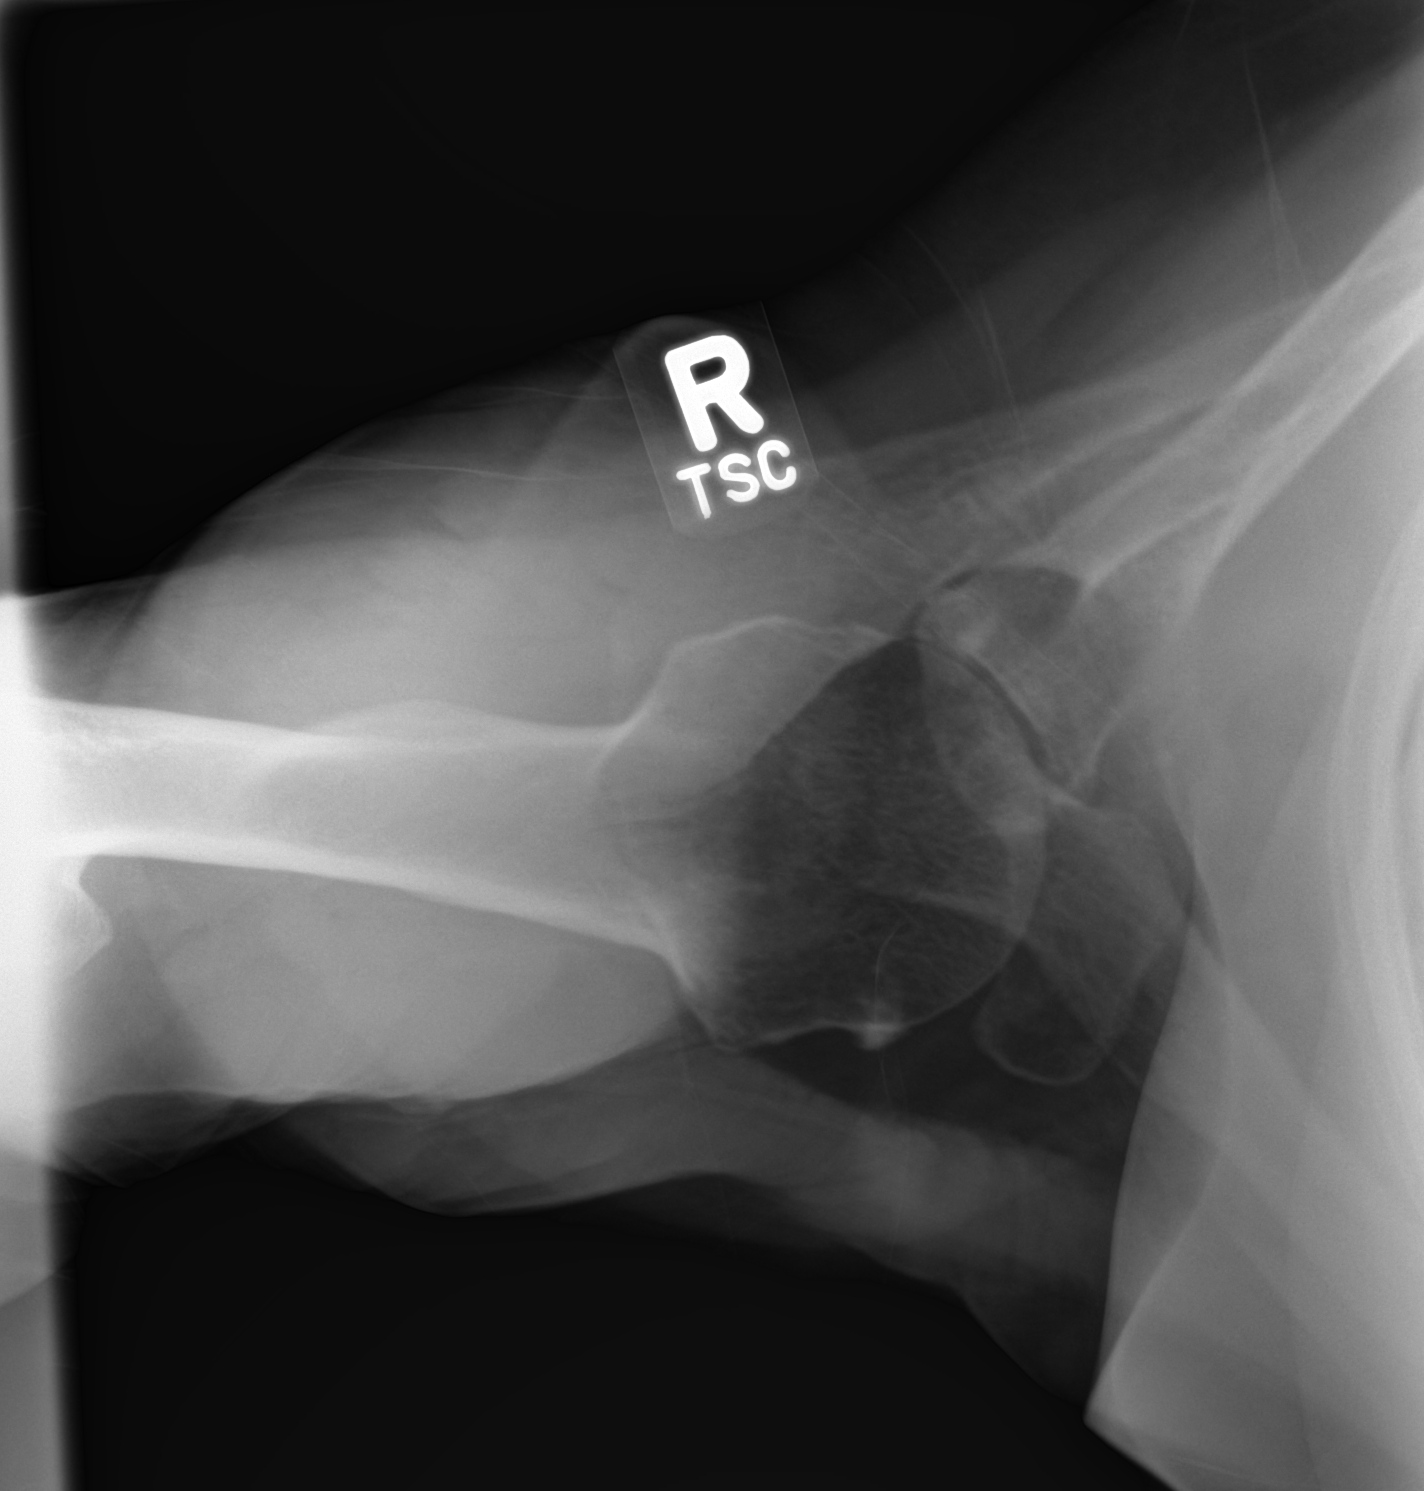

[shoulder ap]
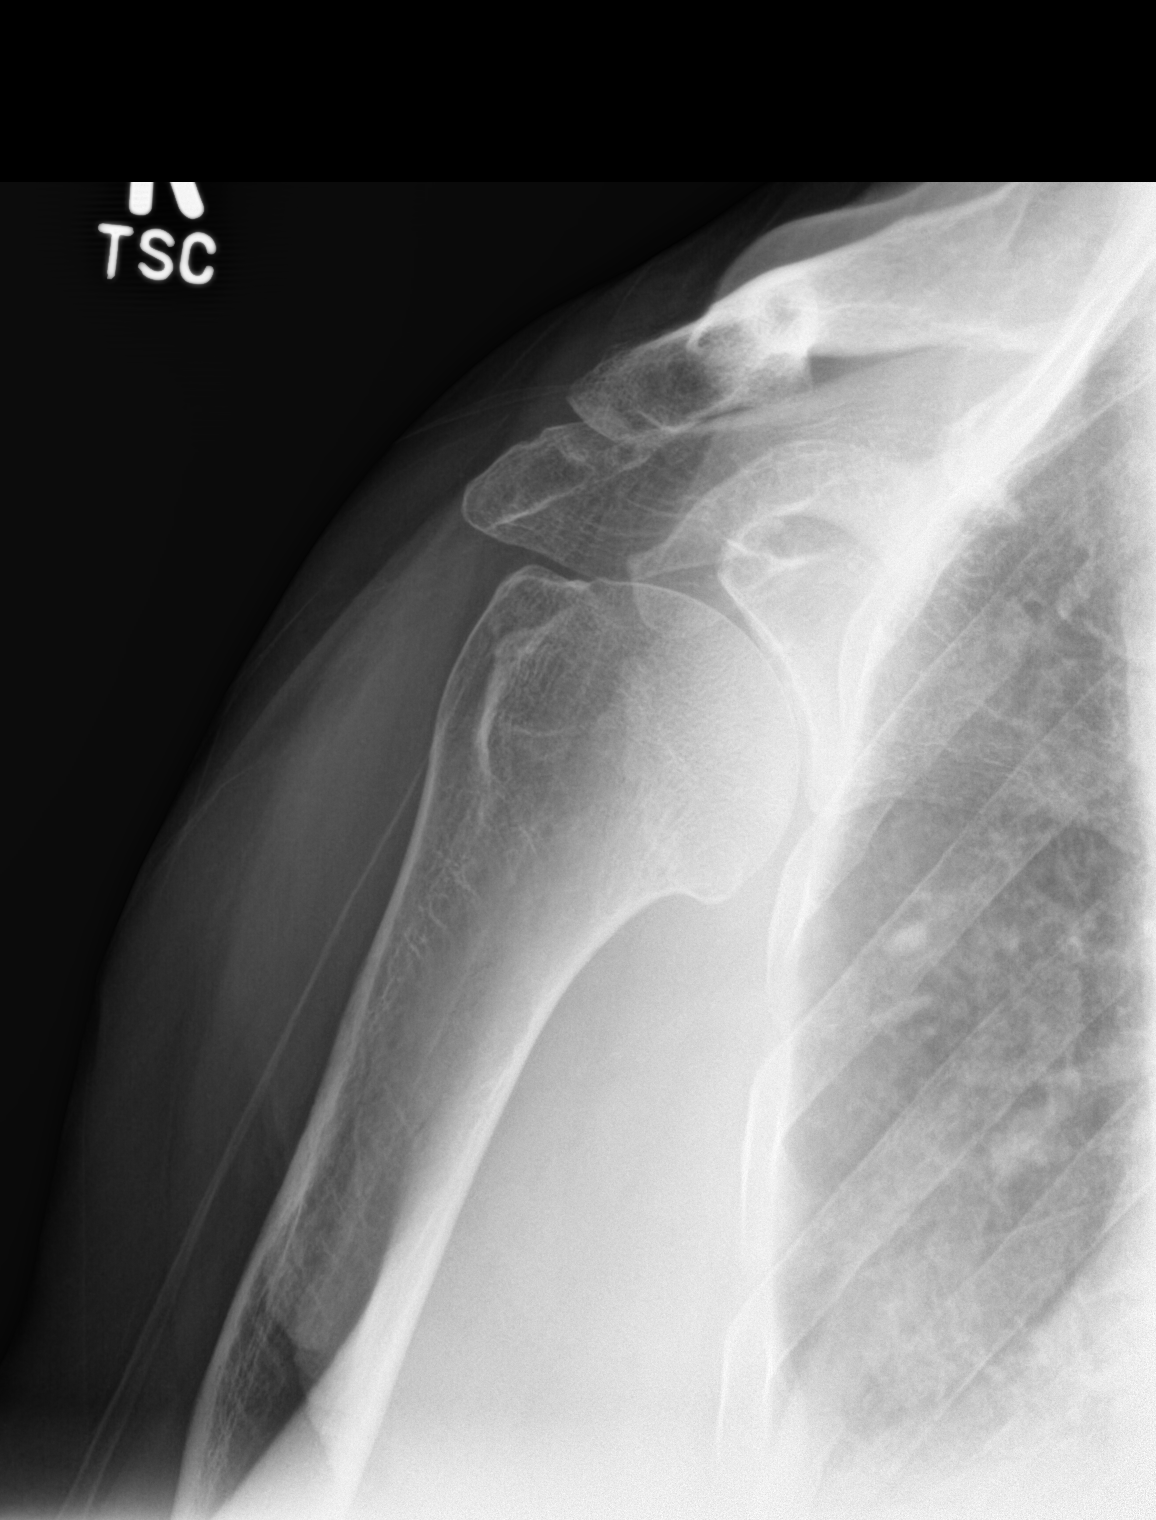

[shoulder y-view]
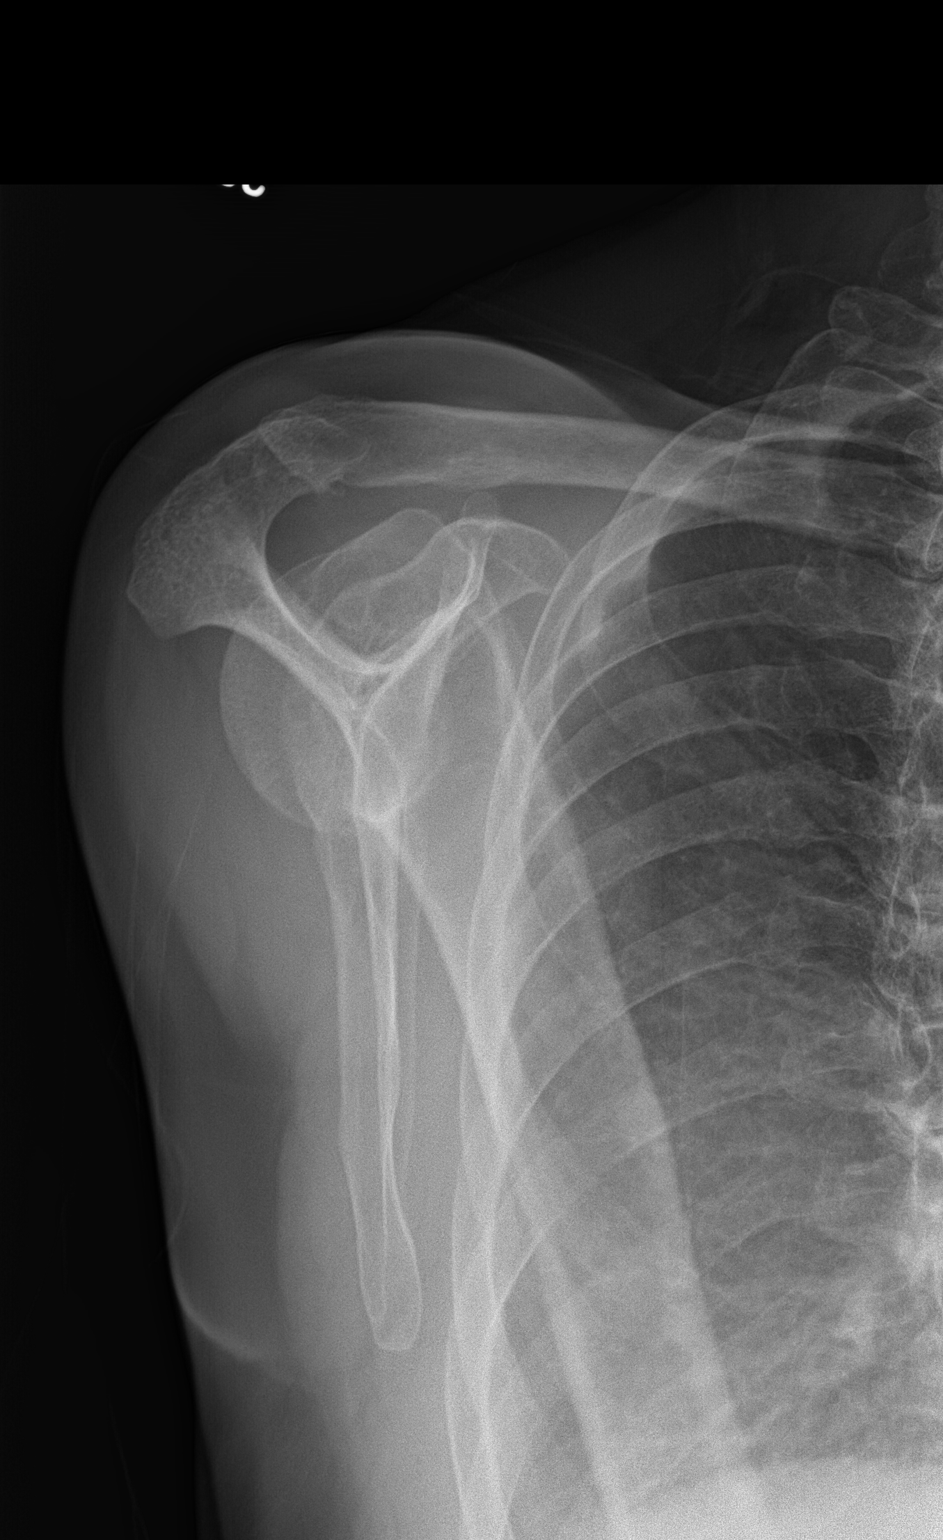

[3 of 3 positions shown; findings below may reference images not displayed]

FINDINGS: No evidence of acute fracture or glenohumeral dislocation. Mild
narrowing of the glenohumeral joint space. Subacromial space
well-preserved. Acromioclavicular joint intact with only mild
degenerative changes. Bone mineral density well preserved for age.
IMPRESSION: Mild osteoarthritis involving glenohumeral joint. No acute or
subacute osseous abnormality. Mild degenerative changes in the AC
joint.

## 2016-10-12 ENCOUNTER — Other Ambulatory Visit: Payer: Self-pay | Admitting: Primary Care

## 2016-10-12 DIAGNOSIS — K219 Gastro-esophageal reflux disease without esophagitis: Secondary | ICD-10-CM

## 2016-10-12 DIAGNOSIS — I10 Essential (primary) hypertension: Secondary | ICD-10-CM

## 2016-10-12 NOTE — Telephone Encounter (Signed)
Ok to refill? Electronically refill request for amLODipine (NORVASC) 2.5 MG tablet  Medication does not look like has been prescribed by Anda Kraft.  Last seen on 07/02/2016

## 2016-10-12 NOTE — Telephone Encounter (Signed)
Ok to refill? Electronically refill request for pantoprazole (PROTONIX) 40 MG tablet  Last prescribed in ED on 06/20/2016. Last prescribed by Anda Kraft on 05/18/2016.  Last seen on 07/02/2016

## 2016-10-30 ENCOUNTER — Telehealth: Payer: Self-pay

## 2016-10-30 DIAGNOSIS — R32 Unspecified urinary incontinence: Secondary | ICD-10-CM

## 2016-10-30 MED ORDER — OXYBUTYNIN CHLORIDE ER 5 MG PO TB24: 5.0000 mg | ORAL_TABLET | Freq: Every day | ORAL | 0 refills | Status: DC

## 2016-10-30 NOTE — Telephone Encounter (Signed)
She's due for an office visit follow up for all of her medical conditions. I'd like to see her within the next month, please schedule. We can try oxybutynin (Diptropan), has she ever tried that?

## 2016-10-30 NOTE — Telephone Encounter (Signed)
Spoken and notified patient;s daughter of [name]'s comments. Patient's daughter stated that patient would like to try oxybutynin.  Follow up on 11/16/2016

## 2016-10-30 NOTE — Telephone Encounter (Signed)
Cindy Brandt (DPR signed) said that pt had been paying $25.00 for myrbetriq; this month cost to pt was $81.00 and pharmacist advised there was increase in pts copay. Cindy Brandt said pt does not think myrbetriq is working as well as before; now pt is having more issues with frequency and urgency of urine and pt wants to try different med. [place] Pt last seen 07/02/16.

## 2016-10-30 NOTE — Telephone Encounter (Signed)
Noted, Rx for oxybutynin ER sent to pharmacy. Will follow up at upcoming visit.

## 2016-11-16 ENCOUNTER — Encounter: Payer: Self-pay | Admitting: Primary Care

## 2016-11-16 ENCOUNTER — Ambulatory Visit (INDEPENDENT_AMBULATORY_CARE_PROVIDER_SITE_OTHER): Payer: Medicare Other | Admitting: Primary Care

## 2016-11-16 VITALS — BP 114/70 | HR 61 | Temp 97.8°F | Wt 125.0 lb

## 2016-11-16 DIAGNOSIS — I10 Essential (primary) hypertension: Secondary | ICD-10-CM | POA: Diagnosis not present

## 2016-11-16 DIAGNOSIS — I2510 Atherosclerotic heart disease of native coronary artery without angina pectoris: Secondary | ICD-10-CM

## 2016-11-16 DIAGNOSIS — F419 Anxiety disorder, unspecified: Secondary | ICD-10-CM | POA: Diagnosis not present

## 2016-11-16 DIAGNOSIS — K589 Irritable bowel syndrome without diarrhea: Secondary | ICD-10-CM | POA: Diagnosis not present

## 2016-11-16 DIAGNOSIS — F329 Major depressive disorder, single episode, unspecified: Secondary | ICD-10-CM

## 2016-11-16 DIAGNOSIS — R32 Unspecified urinary incontinence: Secondary | ICD-10-CM | POA: Diagnosis not present

## 2016-11-16 DIAGNOSIS — E785 Hyperlipidemia, unspecified: Secondary | ICD-10-CM | POA: Diagnosis not present

## 2016-11-16 DIAGNOSIS — K219 Gastro-esophageal reflux disease without esophagitis: Secondary | ICD-10-CM | POA: Diagnosis not present

## 2016-11-16 DIAGNOSIS — F411 Generalized anxiety disorder: Secondary | ICD-10-CM | POA: Diagnosis not present

## 2016-11-16 DIAGNOSIS — H9113 Presbycusis, bilateral: Secondary | ICD-10-CM | POA: Diagnosis not present

## 2016-11-16 DIAGNOSIS — F32A Depression, unspecified: Secondary | ICD-10-CM

## 2016-11-16 MED ORDER — BUSPIRONE HCL 7.5 MG PO TABS: 7.5000 mg | ORAL_TABLET | Freq: Two times a day (BID) | ORAL | 3 refills | Status: DC

## 2016-11-16 MED ORDER — ATORVASTATIN CALCIUM 80 MG PO TABS: 80.0000 mg | ORAL_TABLET | Freq: Every day | ORAL | 3 refills | Status: DC

## 2016-11-16 MED ORDER — PANTOPRAZOLE SODIUM 40 MG PO TBEC: 40.0000 mg | DELAYED_RELEASE_TABLET | Freq: Every day | ORAL | 3 refills | Status: DC

## 2016-11-16 MED ORDER — DICYCLOMINE HCL 10 MG PO CAPS: ORAL_CAPSULE | ORAL | 1 refills | Status: DC

## 2016-11-16 MED ORDER — OXYBUTYNIN CHLORIDE ER 5 MG PO TB24: 5.0000 mg | ORAL_TABLET | Freq: Every day | ORAL | 3 refills | Status: DC

## 2016-11-16 NOTE — Assessment & Plan Note (Signed)
Stable since switching from Myrbetriq. Doing well on oxybutynin Xl 5, continue same.

## 2016-11-16 NOTE — Assessment & Plan Note (Signed)
Lipids stable from April 2018, continue same. Refill sent to pharmacy.

## 2016-11-16 NOTE — Assessment & Plan Note (Signed)
Anxiety improved since addition of Buspar. Continue both sertraline and buspirone.

## 2016-11-16 NOTE — Assessment & Plan Note (Signed)
Referral placed to audiology for further evaluation.

## 2016-11-16 NOTE — Assessment & Plan Note (Signed)
Appears to still have symptoms with pantoprazole 40 mg. Start Zantac 150 mg once daily at opposite time of day as pantoprazole. Discouraged recurrent Tums use, they will notify if no improvement.

## 2016-11-16 NOTE — Patient Instructions (Signed)
You will be contacted regarding your referral to Audiology for hearing evaluation.  Please let us know if you have not heard back within one week.   I sent refills to the pharmacy as discussed.  Start taking Zantac 150 mg once daily at the opposite time of day from pantoprazole for heartburn/cough/  Follow up in 6 months for re-evaluation or sooner if needed.  It was a pleasure to see you today!

## 2016-11-16 NOTE — Assessment & Plan Note (Signed)
Stable in the office today, continue current regimen. 

## 2016-11-16 NOTE — Assessment & Plan Note (Signed)
Following with cardiology. Continue statin, aspirin, clopidogrel, hypertension management. Due for re-evaluation later this year.

## 2016-11-16 NOTE — Assessment & Plan Note (Signed)
Unsure if symptoms are more GERD or IBS related. Will continue dicyclomine as prescribed.

## 2016-11-16 NOTE — Progress Notes (Signed)
Subjective:    Patient ID: Cindy Brandt, female    DOB: 1931/03/24, 83 y.o.   MRN: [id number]  HPI  Cindy Brandt is an 83 year old female who presents today for follow up.  1) Essential Hypertension/NSTEMI/CAD: Currently managed on amlodipine 2.5 mg, atenolol 100 mg in the morning and 50 mg in the evening, Imdur 60 mg, spironolactone 25 mg. Also managed on atorvastatin 80 mg, aspirin 81 mg, clopidogrel 75 mg. Following with cardiology.  BP Readings from Last 3 Encounters:  11/16/16 114/70  08/03/16 122/66  07/02/16 108/62   2) Anxiety and Depression: Currently managed on Buspar 7.5 mg BID and sertraline 100 mg. Buspar was added to Cindy regimen in May 2018 given reports of increased anxiety. Since addition of Buspar she and Cindy Brandt have noticed improvement in both anxiety and depression.   3) Urinary Incontinence: Currently managed on oxybutynin XL 5 mg. Previously managed on Myrbetriq but could no longer afford due to increase in price. Patient's Brandt reported that she didn't believe the Myrbetriq was effective so she was switched to oxybutynin Xl in late August 2018. Overall she thinks the oxybutynin is helping with symptoms and is more affordable.   4) GERD: Currently managed on pantoprazole 40 mg. She continues to struggle with symptoms of esophageal burning, cough, and chest discomfort. She's chewing numerous Tums daily with temporary improvement.   5) IBS: Currently managed on dicyclomine 10 mg capsules PRN. She's taking the dicyclomine twice daily, everyday.  6) Decreased Hearing: Chronic for years, gradually worse over the last several years. Not hearing doorbells, people knocking on doors, telephones ringing. She did have hearing aids but can't use them given as they've not worked well for years. Cindy Brandt is worried given Cindy lack of hearing and would like referral for evaluation of better hearing aids.  Review of Systems  HENT: Positive for hearing loss.   Respiratory:  Negative for shortness of breath.   Cardiovascular: Negative for chest pain.  Gastrointestinal:       Esophageal burning  Genitourinary:       Incontinence stable on oxybutynin  Neurological: Negative for dizziness and headaches.  Psychiatric/Behavioral:       Improved anxiety since addition of Buspar.       Past Medical History:  Diagnosis Date  . Allergy   . Anemia   . Anxiety   . Arthritis   . Blood transfusion without reported diagnosis   . Cataract    a. bilerteral cataracts removed  . Coronary atherosclerosis of native coronary artery    a. 2003 Cath/unsuccessful PCI of occluded RCA;  b. 2005 NSTEMI/Cath: CTO RCA w/ L->R collats, LAD 20, LCX 28m, nl EF;  c. 11/2011 Myoview: no ischemia.  . Diastolic dysfunction 89/38/1017   a. 11/2011 Echo: Ejection fraction 60-65%, gr1 DD, basal inferior AK;  b. 09/2013 Echo: EF 55-60%, mild LVH, no rwma, Gr1 DD, mildly dil LA.  . Gastritis    a. 04/2016 EGD: Nl esophagus, gastritis, nl duodenal bulb & 2nd portion of the duodenum.  Marland Kitchen Headache(784.0)   . Hyperlipidemia   . Hypertension   . Hypokalemia   . Tubular adenoma of colon 2013     Social History   Social History  . Marital status: Married    Spouse name: N/A  . Number of children: 5  . Years of education: N/A   Occupational History  . Retired    Social History Main Topics  . Smoking status: Former Smoker  Packs/day: 0.50    Years: 25.00    Types: Cigarettes    Quit date: 04/06/1996  . Smokeless tobacco: Never Used  . Alcohol use No  . Drug use: No  . Sexual activity: Not on file   Other Topics Concern  . Not on file   Social History Narrative   Currently living with dtr in [place].  No regular exercise.    Past Surgical History:  Procedure Laterality Date  . CARPAL TUNNEL RELEASE Bilateral   . CHOLECYSTECTOMY  2004  . COLONOSCOPY  08/06/2011   Procedure: COLONOSCOPY;  Surgeon: [name], MD;  Location: [facility];  Service: Endoscopy;  Laterality: N/A;   10:40 AM  . GALLBLADDER SURGERY    . LEFT HEART CATH AND CORONARY ANGIOGRAPHY N/A 06/15/2016   Procedure: Left Heart Cath and Coronary Angiography;  Surgeon: Lorretta Harp, MD;  Location: [facility];  Service: Cardiovascular;  Laterality: N/A;  . PARTIAL HYSTERECTOMY    . repair of belly button    . UMBILICAL HERNIA REPAIR     Umbilical hernia repair as a child    Family History  Problem Relation Age of Onset  . Coronary artery disease Cindy Brandt   . Heart attack Cindy Brandt   . Colon cancer Cindy Brandt   . Coronary artery disease Cindy Brandt   . Stomach cancer Cindy Brandt   . Heart disease Cindy Brandt   . Diabetes Cindy Brandt   . Pancreatic cancer Neg Hx   . Rectal cancer Neg Hx   . Esophageal cancer Neg Hx     Allergies  Allergen Reactions  . Tylenol [Acetaminophen] Other (See Comments)    Funny feeling in chest.     Current Outpatient Prescriptions on File Prior to Visit  Medication Sig Dispense Refill  . alendronate (FOSAMAX) 70 MG tablet Take 70 mg by mouth once a week.  4  . amLODipine (NORVASC) 2.5 MG tablet TAKE 1 TABLET (2.5 MG TOTAL) BY MOUTH EVERY EVENING. 90 tablet 3  . aspirin EC 81 MG tablet Take 81 mg by mouth every morning.    Marland Kitchen atenolol (TENORMIN) 100 MG tablet Take 100 mg (1 tab) by mouth in the morning and 50 mg (1/2 tab) in the evening. 135 tablet 6  . calcium carbonate (CALCIUM 600) 600 MG TABS tablet Take 1,200 mg by mouth daily.    . clopidogrel (PLAVIX) 75 MG tablet Take 1 tablet (75 mg total) by mouth daily. 30 tablet 6  . isosorbide mononitrate (IMDUR) 60 MG 24 hr tablet Take 1.5 tablets (90 mg total) by mouth daily. 60 tablet 3  . nitroGLYCERIN (NITROSTAT) 0.4 MG SL tablet Place 1 tablet (0.4 mg total) under the tongue every 5 (five) minutes as needed for chest pain. Chest Pain 25 tablet 11  . sertraline (ZOLOFT) 100 MG tablet TAKE 1 TABLET BY MOUTH EVERY DAY IN THE MORNING 90 tablet 1  . spironolactone (ALDACTONE) 25 MG tablet Take 0.5 tablets (12.5 mg total) by mouth  daily. 30 tablet 3   No current facility-administered medications on file prior to visit.     BP 114/70   Pulse 61   Temp 97.8 F (36.6 C) (Oral)   Wt 125 lb (56.7 kg)   SpO2 97%   BMI 24.41 kg/m    Objective:   Physical Exam  Constitutional: She is oriented to person, place, and time. She appears well-nourished.  HENT:  Difficulty hearing conversation. Have to speak directly into Cindy ear.  Neck: Neck supple.  Cardiovascular: Normal rate and regular rhythm.   Pulmonary/Chest: Effort normal and breath sounds normal.  Abdominal: Soft. Bowel sounds are normal. There is no tenderness.  Neurological: She is alert and oriented to person, place, and time.  Skin: Skin is warm and dry.  Psychiatric: She has a normal mood and affect.          Assessment & Plan:

## 2016-11-26 ENCOUNTER — Other Ambulatory Visit: Payer: Self-pay | Admitting: Cardiology

## 2016-11-27 ENCOUNTER — Other Ambulatory Visit: Payer: Self-pay | Admitting: Primary Care

## 2016-11-27 DIAGNOSIS — R32 Unspecified urinary incontinence: Secondary | ICD-10-CM

## 2016-12-10 ENCOUNTER — Other Ambulatory Visit: Payer: Self-pay | Admitting: Primary Care

## 2016-12-11 NOTE — Telephone Encounter (Signed)
I have not been prescribing this medication. Please send refill request to prescribing provider.

## 2016-12-11 NOTE — Telephone Encounter (Signed)
If this was prescribed in April for a 30 day supply, then she would have run out several months ago. Did she run out or is someone else refilling this?

## 2016-12-11 NOTE — Telephone Encounter (Signed)
Ok to refill? Electronically refill request for spironolactone (ALDACTONE) 25 MG tablet  Medication have not yet been prescribed by Anda Kraft. Last seen on 11/16/2016

## 2016-12-11 NOTE — Telephone Encounter (Addendum)
This medication was last prescribed from provider in the [facility] back on 06/20/2016  What would you like me to do?

## 2016-12-18 ENCOUNTER — Ambulatory Visit (INDEPENDENT_AMBULATORY_CARE_PROVIDER_SITE_OTHER): Payer: Medicare Other | Admitting: Cardiovascular Disease

## 2016-12-18 ENCOUNTER — Encounter: Payer: Self-pay | Admitting: Cardiovascular Disease

## 2016-12-18 DIAGNOSIS — E7800 Pure hypercholesterolemia, unspecified: Secondary | ICD-10-CM

## 2016-12-18 DIAGNOSIS — I10 Essential (primary) hypertension: Secondary | ICD-10-CM

## 2016-12-18 DIAGNOSIS — I2510 Atherosclerotic heart disease of native coronary artery without angina pectoris: Secondary | ICD-10-CM

## 2016-12-18 NOTE — Assessment & Plan Note (Signed)
History of hypertension with blood pressure measured today at 102/58. He is on amlodipine, Tenormin and spironolactone. Continue her meds at current dosing

## 2016-12-18 NOTE — Progress Notes (Signed)
12/18/2016 Cindy Brandt   1931-12-13  [id number]  Primary Physician Cindy Koch, NP Primary Cardiologist: Cindy Harp MD Cindy Brandt, [place]  HPI:  Cindy Brandt is a 83 y.o.  widowed African-American female mother of 2 children he was accompanied by her daughter Cindy Brandt. I last saw her in the office 07/02/16.   she has a h/o CAD (CTO of RCA & nonobs LAD/LCX dzs - 2005), HTN, HL, Diast Dysfxn, hypokalemia, and gastritis (EGD 04/2016), who presented on transfer from [facility] for admission after presentation w/ nitrate responsive chest pain and mild troponin elevation. I performed cardiac catheterization on her 06/15/16 revealed an occluded RCA with left right collaterals, occluded nondominant circumflex, high-grade ostial ramus branch stenosis and 50% proximal LAD stenosis. She had normal LV function with grade 2 diastolic dysfunction. She is on optimal medical therapy. She does suffer from anxiety. She has nocturnal chest pain especially when she is recumbent with which sounds more like GERD. She is on an H2 antagonist.    Current Meds  Medication Sig  . alendronate (FOSAMAX) 70 MG tablet Take 70 mg by mouth once a week.  Marland Kitchen amLODipine (NORVASC) 2.5 MG tablet TAKE 1 TABLET (2.5 MG TOTAL) BY MOUTH EVERY EVENING.  Marland Kitchen aspirin EC 81 MG tablet Take 81 mg by mouth every morning.  Marland Kitchen atenolol (TENORMIN) 100 MG tablet Take 100 mg (1 tab) by mouth in the morning and 50 mg (1/2 tab) in the evening.  Marland Kitchen atorvastatin (LIPITOR) 80 MG tablet Take 1 tablet (80 mg total) by mouth daily at 6 PM.  . busPIRone (BUSPAR) 7.5 MG tablet Take 1 tablet (7.5 mg total) by mouth 2 (two) times daily.  . calcium carbonate (CALCIUM 600) 600 MG TABS tablet Take 1,200 mg by mouth daily.  . cetirizine (ZYRTEC) 10 MG tablet Take 10 mg by mouth daily.  . clopidogrel (PLAVIX) 75 MG tablet Take 1 tablet (75 mg total) by mouth daily.  Marland Kitchen dicyclomine (BENTYL) 10 MG capsule Take 1 capsule by mouth twice daily as needed for IBS  symptoms.  . isosorbide mononitrate (IMDUR) 60 MG 24 hr tablet TAKE 1.5 TABLETS (90 MG TOTAL) BY MOUTH DAILY.  . nitroGLYCERIN (NITROSTAT) 0.4 MG SL tablet Place 1 tablet (0.4 mg total) under the tongue every 5 (five) minutes as needed for chest pain. Chest Pain  . oxybutynin (DITROPAN-XL) 5 MG 24 hr tablet Take 1 tablet (5 mg total) by mouth at bedtime.  . pantoprazole (PROTONIX) 40 MG tablet Take 1 tablet (40 mg total) by mouth daily.  . sertraline (ZOLOFT) 100 MG tablet TAKE 1 TABLET BY MOUTH EVERY DAY IN THE MORNING  . spironolactone (ALDACTONE) 25 MG tablet Take 0.5 tablets (12.5 mg total) by mouth daily.     Allergies  Allergen Reactions  . Tylenol [Acetaminophen] Other (See Comments)    Funny feeling in chest.     Social History   Social History  . Marital status: Married    Spouse name: N/A  . Number of children: 5  . Years of education: N/A   Occupational History  . Retired    Social History Main Topics  . Smoking status: Former Smoker    Packs/day: 0.50    Years: 25.00    Types: Cigarettes    Quit date: 04/06/1996  . Smokeless tobacco: Never Used  . Alcohol use No  . Drug use: No  . Sexual activity: Not on file   Other Topics Concern  .  Not on file   Social History Narrative   Currently living with dtr in [place].  No regular exercise.     Review of Systems: General: negative for chills, fever, night sweats or weight changes.  Cardiovascular: negative for chest pain, dyspnea on exertion, edema, orthopnea, palpitations, paroxysmal nocturnal dyspnea or shortness of breath Dermatological: negative for rash Respiratory: negative for cough or wheezing Urologic: negative for hematuria Abdominal: negative for nausea, vomiting, diarrhea, bright red blood per rectum, melena, or hematemesis Neurologic: negative for visual changes, syncope, or dizziness All other systems reviewed and are otherwise negative except as noted above.    Blood pressure (!) 102/58, pulse (!)  56, height 4\' 11"  (1.499 m), weight 124 lb 6.4 oz (56.4 kg).  General appearance: alert and no distress Neck: no adenopathy, no carotid bruit, no JVD, supple, symmetrical, trachea midline and thyroid not enlarged, symmetric, no tenderness/mass/nodules Lungs: clear to auscultation bilaterally Heart: regular rate and rhythm, S1, S2 normal, no murmur, click, rub or gallop Extremities: extremities normal, atraumatic, no cyanosis or edema Pulses: 2+ and symmetric Skin: Skin color, texture, turgor normal. No rashes or lesions Neurologic: Alert and oriented X 3, normal strength and tone. Normal symmetric reflexes. Normal coordination and gait  EKG sinus bradycardia 56 without ST or T-wave changes. Personally reviewed this EKG.  ASSESSMENT AND PLAN:   Hyperlipidemia History of hyperlipidemia on statin therapy with recent lipid profile performed 06/15/16 revealing total cholesterol 102, LDL 57 and HDL of 27.  Essential hypertension History of hypertension with blood pressure measured today at 102/58. He is on amlodipine, Tenormin and spironolactone. Continue her meds at current dosing  CAD (coronary atherosclerotic disease) History of coronary artery disease status post cardiac catheterization performed 06/15/16 revealing occluded RCA stent with left right collaterals, occluded nondominant circumflex, high-grade ostial ramus disease and 50% segmental mid LAD with normal LV function. She does complain of some atypical chest pain which sounds more like GERD. She is on a long-acting oral nitrate. I recommend continued medical therapy at this time      Cindy Harp MD [facility], [facility] 12/18/2016 4:21 PM

## 2016-12-18 NOTE — Patient Instructions (Signed)
Medication Instructions: Your physician recommends that you continue on your current medications as directed. Please refer to the Current Medication list given to you today.   Follow-Up: We request that you follow-up in: 6 months with Luke Kilroy, PA and in 12 months with Dr Berry  You will receive a reminder letter in the mail two months in advance. If you don't receive a letter, please call our office to schedule the follow-up appointment.  If you need a refill on your cardiac medications before your next appointment, please call your pharmacy.  

## 2016-12-18 NOTE — Telephone Encounter (Signed)
Message left for patient to return my call on 12/14/2016 and 12/15/2016  Tried to talk to patient's daughter today but she was in an appointment.

## 2016-12-18 NOTE — Assessment & Plan Note (Signed)
History of hyperlipidemia on statin therapy with recent lipid profile performed 06/15/16 revealing total cholesterol 102, LDL 57 and HDL of 27.

## 2016-12-18 NOTE — Assessment & Plan Note (Signed)
History of coronary artery disease status post cardiac catheterization performed 06/15/16 revealing occluded RCA stent with left right collaterals, occluded nondominant circumflex, high-grade ostial ramus disease and 50% segmental mid LAD with normal LV function. She does complain of some atypical chest pain which sounds more like GERD. She is on a long-acting oral nitrate. I recommend continued medical therapy at this time

## 2016-12-22 ENCOUNTER — Other Ambulatory Visit: Payer: Self-pay | Admitting: Primary Care

## 2016-12-25 MED ORDER — SPIRONOLACTONE 25 MG PO TABS: 12.5000 mg | ORAL_TABLET | Freq: Every day | ORAL | 1 refills | Status: DC

## 2016-12-25 NOTE — Telephone Encounter (Signed)
Spoken to patient's daughter. She stated that they has refill from her previous PCP and then the hospital.   Patient is almost out of the medication and she is taking a full 25 mg.

## 2016-12-25 NOTE — Telephone Encounter (Signed)
Noted, Rx sent to pharmacy. BMP from April 2018 reviewed and is stable.

## 2017-01-02 ENCOUNTER — Other Ambulatory Visit: Payer: Self-pay | Admitting: Cardiology

## 2017-01-04 NOTE — Telephone Encounter (Signed)
REFILL 

## 2017-01-18 ENCOUNTER — Telehealth: Payer: Self-pay | Admitting: Primary Care

## 2017-01-18 NOTE — Telephone Encounter (Signed)
Please notify patient that the pharmacy is requesting a replacement for dicyclomine (for IBS). I provided her with a 3 month supply in mid September so she should have one month left. Is this the case or is she out? How often is she taking the dicyclomine? Is it helping?

## 2017-01-18 NOTE — Telephone Encounter (Signed)
Copied from [place] [id number]. Topic: Quick Communication - See Telephone Encounter >> Jan 18, 2017 10:42 AM Arletha Grippe wrote: CRM for notification. See Telephone encounter for:   [facility]/18/17.[facility] [address] called. Pt medication dicyclomine (BENTYL) 10 MG capsule is on back order with no date when it will be available. Pharmacy is asking for rx of substitute medication to be sent electronically. Phone number is [phone].

## 2017-01-19 NOTE — Telephone Encounter (Signed)
Noted, please thank her for the update. If the dicyclomine isn't helping then let's stop it. I recommend trying probiotics for gas/bloating. These can be purchased over the counter and are taken once daily. Also try to avoid gas provoking foods such as: beans, corn, potatoes, broccoli, etc.

## 2017-01-19 NOTE — Telephone Encounter (Signed)
Spoken and notified patient's daughter Dewaine Oats) of [name]'s comments. She stated that patient has been out of the medication for a week. The pharmacy told her to check with the doctor so that's why called. She stated that she does not noticed any different for patient with or without the dicyclomine. She said that the GI doctor told them that patient just have excessive gas and indigestion. She asked if there is something else patient may take since she still have that stomach pain.

## 2017-01-19 NOTE — Telephone Encounter (Signed)
Daughter advised.

## 2017-03-17 ENCOUNTER — Telehealth: Payer: Self-pay | Admitting: Primary Care

## 2017-03-17 ENCOUNTER — Other Ambulatory Visit: Payer: Self-pay | Admitting: Primary Care

## 2017-03-17 DIAGNOSIS — E2839 Other primary ovarian failure: Secondary | ICD-10-CM

## 2017-03-17 NOTE — Telephone Encounter (Signed)
Ok to refill? Electronically refill request for alendronate (FOSAMAX) 70 MG tablet  Medication have not been prescribed by Anda Kraft. Last seen on 11/16/2016

## 2017-03-17 NOTE — Telephone Encounter (Signed)
Has she been taking the Fosamax weekly since it was prescribed by another prescriber? How many years has she been taking this? Also needs repeat bone density scan, please ask if she'd like this done in [place] or [place].

## 2017-03-17 NOTE — Telephone Encounter (Signed)
Copied from [place] [id number]. Topic: Quick Communication - Rx Refill/Question >> Mar 17, 2017  9:54 AM Celedonio Savage L wrote: Medication: alendronate (FOSAMAX) 70 MG tablet a Doctor Delman Cheadle in [place] use to prescribe pt this medicine   Has the patient contacted their pharmacy? Yes.     (Agent: If no, request that the patient contact the pharmacy for the refill.)   Preferred Pharmacy (with phone number or street name): [id number] [address], [place] [phone] (Phone) [phone] (Fax)     Agent: Please be advised that RX refills may take up to 3 business days. We ask that you follow-up with your pharmacy.

## 2017-03-17 NOTE — Telephone Encounter (Signed)
Pt. Requesting Fosamax another provider has prescribed. Please advise. Thanks.

## 2017-03-17 NOTE — Telephone Encounter (Signed)
Spoken and notified patient's daughter of [name]'s comments. Patient's daughter stated that patient's previous pcp was the one who last prescribed the medication. Patient's daughter is not sure how long she has been taking the medication but patient has been on this medication since she moved in with her which is over 3 years. Patient's daughter stated that [facility] is the best place to get the bone density scan.

## 2017-03-18 NOTE — Telephone Encounter (Signed)
Noted.  Bone density scan ordered, refill sent to pharmacy.

## 2017-03-18 NOTE — Telephone Encounter (Signed)
Noted.  Refill for Fosamax provided for 3 months.  Bone density scan pending.

## 2017-03-28 ENCOUNTER — Other Ambulatory Visit: Payer: Self-pay | Admitting: Primary Care

## 2017-03-30 ENCOUNTER — Telehealth: Payer: Self-pay | Admitting: Primary Care

## 2017-03-30 NOTE — Telephone Encounter (Signed)
Pt last seen 11/16/16; spironolactone 25 mg taking one daily on 12/11/46 phone note; when refilled 03/29/17 instructions take 1/2 tab daily; I may have overlooked but I could not see when dosage reduced.Please advise.

## 2017-03-30 NOTE — Telephone Encounter (Signed)
Please ask cardiologist for correct dosing as I typically do not prescribe this medication.

## 2017-03-30 NOTE — Telephone Encounter (Signed)
Copied from [place]. Topic: General - Other >> Mar 30, 2017  1:46 PM Synthia Innocent wrote: Reason for CRM: Would like to speak to nurse regarding med, spironolactone (ALDACTONE) 25 MG tablet. Needs to know the correct dose.

## 2017-03-31 ENCOUNTER — Telehealth: Payer: Self-pay | Admitting: Cardiovascular Disease

## 2017-03-31 NOTE — Telephone Encounter (Signed)
Copied from [place]. Topic: General - Other >> Mar 30, 2017  1:46 PM Synthia Innocent wrote: Reason for CRM: Would like to speak to nurse regarding med, spironolactone (ALDACTONE) 25 MG tablet. Needs to know the correct dose.  >> Mar 31, 2017 10:07 AM Percell Belt A wrote: Pt pt daughter , she said that pt is taking 25 mg 1 tab daily.  The script says 1/2 a pill but pt is actually taking 1 full pill daily at 25mg s

## 2017-03-31 NOTE — Telephone Encounter (Signed)
Per chart review, patient is to take spironolactone 12.5mg  daily. This was a med change in April 2018 after her hospitalization. She states her mom has been taking 25mg  (whole tablet) but has not had issues with costs/insurance note covering until now. Daughter is unsure how long she has been taking incorrect dose. Advised on correct and notified her that patient's PCP sent in refill on 03/29/17

## 2017-03-31 NOTE — Telephone Encounter (Signed)
Spoken and notified patient's daughter of [name]'s comments. Patient's daughter stated that she will call the cardiologist regarding the Rx and dosage.

## 2017-03-31 NOTE — Telephone Encounter (Signed)
Pt c/o medication issue:  1. Name of Medication: Spironolactone   2. How are you currently taking this medication (dosage and times per day)? 25 1/2 pill daily   3. Are you having a reaction (difficulty breathing--STAT)? no  4. What is your medication issue? Patient daughter [name]) states that patient medication was increased to 1 pill daily, presription was refilled and dosage on the bottle states 1/2 pill.   [name] would like to verify if the dosage is 1/2 daily or 1 pill daily?

## 2017-04-26 ENCOUNTER — Ambulatory Visit
Admission: RE | Admit: 2017-04-26 | Discharge: 2017-04-26 | Disposition: A | Discharge status: Home / Self Care | Payer: Medicare Other | Source: Ambulatory Visit | Attending: Primary Care | Admitting: Primary Care

## 2017-04-26 DIAGNOSIS — M810 Age-related osteoporosis without current pathological fracture: Secondary | ICD-10-CM | POA: Diagnosis not present

## 2017-04-26 DIAGNOSIS — Z780 Asymptomatic menopausal state: Secondary | ICD-10-CM | POA: Diagnosis not present

## 2017-04-26 DIAGNOSIS — E2839 Other primary ovarian failure: Secondary | ICD-10-CM

## 2017-05-03 ENCOUNTER — Other Ambulatory Visit: Payer: Self-pay | Admitting: Primary Care

## 2017-05-04 NOTE — Telephone Encounter (Signed)
Ok to refill? Electronically refill request for isosorbide mononitrate (IMDUR) 60 MG 24 hr tablet  Medication have not been prescribed by Anda Kraft . Last prescribed on 11/27/2016. Last seen on 11/16/2016.

## 2017-05-04 NOTE — Telephone Encounter (Signed)
Please send to cardiology

## 2017-05-05 ENCOUNTER — Other Ambulatory Visit: Payer: Self-pay | Admitting: Cardiovascular Disease

## 2017-05-05 NOTE — Telephone Encounter (Signed)
°*  STAT* If patient is at the pharmacy, call can be transferred to refill team.   1. Which medications need to be refilled? (please list name of each medication and dose if known) Isosorbide   2. Which pharmacy/location (including street and city if local pharmacy) [facility] [address]   3. Do they need a 30 day or 90 day supply? [place]

## 2017-05-06 ENCOUNTER — Telehealth: Payer: Self-pay | Admitting: Cardiovascular Disease

## 2017-05-06 NOTE — Telephone Encounter (Signed)
New message ° ° ° °*STAT* If patient is at the pharmacy, call can be transferred to refill team. ° ° °1. Which medications need to be refilled? (please list name of each medication and dose if known)isosorbide mononitrate (IMDUR) 60 MG 24 hr tablet ° °2. Which pharmacy/location (including street and city if local pharmacy) is medication to be sent to? [id number] [place] [place] - [address] ° °3. Do they need a 30 day or 90 day supply?90 ° °

## 2017-05-07 MED ORDER — ISOSORBIDE MONONITRATE ER 60 MG PO TB24: 90.0000 mg | ORAL_TABLET | Freq: Every day | ORAL | 1 refills | Status: DC

## 2017-05-28 ENCOUNTER — Ambulatory Visit (INDEPENDENT_AMBULATORY_CARE_PROVIDER_SITE_OTHER)
Admission: RE | Admit: 2017-05-28 | Discharge: 2017-05-28 | Disposition: A | Discharge status: Home / Self Care | Payer: Medicare Other | Source: Ambulatory Visit | Attending: Primary Care | Admitting: Primary Care

## 2017-05-28 ENCOUNTER — Ambulatory Visit (INDEPENDENT_AMBULATORY_CARE_PROVIDER_SITE_OTHER): Payer: Medicare Other | Admitting: Primary Care

## 2017-05-28 ENCOUNTER — Ambulatory Visit (HOSPITAL_COMMUNITY): Payer: Medicare Other

## 2017-05-28 ENCOUNTER — Ambulatory Visit
Admission: RE | Admit: 2017-05-28 | Discharge: 2017-05-28 | Disposition: A | Discharge status: Home / Self Care | Payer: Medicare Other | Source: Ambulatory Visit | Attending: Primary Care | Admitting: Primary Care

## 2017-05-28 VITALS — BP 110/66 | HR 67 | Temp 98.1°F | Wt 134.0 lb

## 2017-05-28 DIAGNOSIS — M542 Cervicalgia: Secondary | ICD-10-CM

## 2017-05-28 DIAGNOSIS — M25512 Pain in left shoulder: Secondary | ICD-10-CM

## 2017-05-28 DIAGNOSIS — M5136 Other intervertebral disc degeneration, lumbar region: Secondary | ICD-10-CM | POA: Diagnosis not present

## 2017-05-28 DIAGNOSIS — M19012 Primary osteoarthritis, left shoulder: Secondary | ICD-10-CM | POA: Diagnosis not present

## 2017-05-28 MED ORDER — TRAMADOL HCL 50 MG PO TABS: 50.0000 mg | ORAL_TABLET | Freq: Three times a day (TID) | ORAL | 0 refills | Status: DC | PRN

## 2017-05-28 NOTE — Addendum Note (Signed)
Addended by: Ellamae Sia on: 05/28/2017 02:33 PM   Modules accepted: Orders

## 2017-05-28 NOTE — Patient Instructions (Signed)
Stop taking Ibuprofen, Aleve, Motrin, Advil medications.  Try Tramadol 50 mg tablets for pain. You can take 1 tablet by mouth every 8 hours as needed. Be careful as they may cause drowsiness.   Try stretching and moving your neck and shoulder, take a look at the exercises below.  Complete xray(s) prior to leaving today. I will notify you of your results once received.  It was a pleasure to see you today!   Neck Exercises Neck exercises can be important for many reasons:  They can help you to improve and maintain flexibility in your neck. This can be especially important as you age.  They can help to make your neck stronger. This can make movement easier.  They can reduce or prevent neck pain.  They may help your upper back.  Ask your health care provider which neck exercises would be best for you. Exercises Neck Press Repeat this exercise 10 times. Do it first thing in the morning and right before bed or as told by your health care provider. 1. Lie on your back on a firm bed or on the floor with a pillow under your head. 2. Use your neck muscles to push your head down on the pillow and straighten your spine. 3. Hold the position as well as you can. Keep your head facing up and your chin tucked. 4. Slowly count to 5 while holding this position. 5. Relax for a few seconds. Then repeat.  Isometric Strengthening Do a full set of these exercises 2 times a day or as told by your health care provider. 1. Sit in a supportive chair and place your hand on your forehead. 2. Push forward with your head and neck while pushing back with your hand. Hold for 10 seconds. 3. Relax. Then repeat the exercise 3 times. 4. Next, do thesequence again, this time putting your hand against the back of your head. Use your head and neck to push backward against the hand pressure. 5. Finally, do the same exercise on either side of your head, pushing sideways against the pressure of your hand.  Prone Head  Lifts Repeat this exercise 5 times. Do this 2 times a day or as told by your health care provider. 1. Lie face-down, resting on your elbows so that your chest and upper back are raised. 2. Start with your head facing downward, near your chest. Position your chin either on or near your chest. 3. Slowly lift your head upward. Lift until you are looking straight ahead. Then continue lifting your head as far back as you can stretch. 4. Hold your head up for 5 seconds. Then slowly lower it to your starting position.  Supine Head Lifts Repeat this exercise 8-10 times. Do this 2 times a day or as told by your health care provider. 1. Lie on your back, bending your knees to point to the ceiling and keeping your feet flat on the floor. 2. Lift your head slowly off the floor, raising your chin toward your chest. 3. Hold for 5 seconds. 4. Relax and repeat.  Scapular Retraction Repeat this exercise 5 times. Do this 2 times a day or as told by your health care provider. 1. Stand with your arms at your sides. Look straight ahead. 2. Slowly pull both shoulders backward and downward until you feel a stretch between your shoulder blades in your upper back. 3. Hold for 10-30 seconds. 4. Relax and repeat.  Contact a health care provider if:  Your neck pain  or discomfort gets much worse when you do an exercise.  Your neck pain or discomfort does not improve within 2 hours after you exercise. If you have any of these problems, stop exercising right away. Do not do the exercises again unless your health care provider says that you can. Get help right away if:  You develop sudden, severe neck pain. If this happens, stop exercising right away. Do not do the exercises again unless your health care provider says that you can. Exercises Neck Stretch  Repeat this exercise 3-5 times. 1. Do this exercise while standing or while sitting in a chair. 2. Place your feet flat on the floor, shoulder-width  apart. 3. Slowly turn your head to the right. Turn it all the way to the right so you can look over your right shoulder. Do not tilt or tip your head. 4. Hold this position for 10-30 seconds. 5. Slowly turn your head to the left, to look over your left shoulder. 6. Hold this position for 10-30 seconds.  Neck Retraction Repeat this exercise 8-10 times. Do this 3-4 times a day or as told by your health care provider. 1. Do this exercise while standing or while sitting in a sturdy chair. 2. Look straight ahead. Do not bend your neck. 3. Use your fingers to push your chin backward. Do not bend your neck for this movement. Continue to face straight ahead. If you are doing the exercise properly, you will feel a slight sensation in your throat and a stretch at the back of your neck. 4. Hold the stretch for 1-2 seconds. Relax and repeat.  This information is not intended to replace advice given to you by your health care provider. Make sure you discuss any questions you have with your health care provider. Document Released: 01/28/2015 Document Revised: 07/25/2015 Document Reviewed: 08/27/2014 Elsevier Interactive Patient Education  2018 [employer].    Shoulder Exercises Ask your health care provider which exercises are safe for you. Do exercises exactly as told by your health care provider and adjust them as directed. It is normal to feel mild stretching, pulling, tightness, or discomfort as you do these exercises, but you should stop right away if you feel sudden pain or your pain gets worse.Do not begin these exercises until told by your health care provider. RANGE OF MOTION EXERCISES These exercises warm up your muscles and joints and improve the movement and flexibility of your shoulder. These exercises also help to relieve pain, numbness, and tingling. These exercises involve stretching your injured shoulder directly. Exercise A: Pendulum  1. Stand near a wall or a surface that you can  hold onto for balance. 2. Bend at the waist and let your left / right arm hang straight down. Use your other arm to support you. Keep your back straight and do not lock your knees. 3. Relax your left / right arm and shoulder muscles, and move your hips and your trunk so your left / right arm swings freely. Your arm should swing because of the motion of your body, not because you are using your arm or shoulder muscles. 4. Keep moving your body so your arm swings in the following directions, as told by your health care provider: ? Side to side. ? Forward and backward. ? In clockwise and counterclockwise circles. 5. Continue each motion for __________ seconds, or for as long as told by your health care provider. 6. Slowly return to the starting position. Repeat __________ times. Complete this exercise  __________ times a day. Exercise B:Flexion, Standing  1. Stand and hold a broomstick, a cane, or a similar object. Place your hands a little more than shoulder-width apart on the object. Your left / right hand should be palm-up, and your other hand should be palm-down. 2. Keep your elbow straight and keep your shoulder muscles relaxed. Push the stick down with your healthy arm to raise your left / right arm in front of your body, and then over your head until you feel a stretch in your shoulder. ? Avoid shrugging your shoulder while you raise your arm. Keep your shoulder blade tucked down toward the middle of your back. 3. Hold for __________ seconds. 4. Slowly return to the starting position. Repeat __________ times. Complete this exercise __________ times a day. Exercise C: Abduction, Standing 1. Stand and hold a broomstick, a cane, or a similar object. Place your hands a little more than shoulder-width apart on the object. Your left / right hand should be palm-up, and your other hand should be palm-down. 2. While keeping your elbow straight and your shoulder muscles relaxed, push the stick across  your body toward your left / right side. Raise your left / right arm to the side of your body and then over your head until you feel a stretch in your shoulder. ? Do not raise your arm above shoulder height, unless your health care provider tells you to do that. ? Avoid shrugging your shoulder while you raise your arm. Keep your shoulder blade tucked down toward the middle of your back. 3. Hold for __________ seconds. 4. Slowly return to the starting position. Repeat __________ times. Complete this exercise __________ times a day. Exercise D:Internal Rotation  1. Place your left / right hand behind your back, palm-up. 2. Use your other hand to dangle an exercise band, a towel, or a similar object over your shoulder. Grasp the band with your left / right hand so you are holding onto both ends. 3. Gently pull up on the band until you feel a stretch in the front of your left / right shoulder. ? Avoid shrugging your shoulder while you raise your arm. Keep your shoulder blade tucked down toward the middle of your back. 4. Hold for __________ seconds. 5. Release the stretch by letting go of the band and lowering your hands. Repeat __________ times. Complete this exercise __________ times a day. STRETCHING EXERCISES These exercises warm up your muscles and joints and improve the movement and flexibility of your shoulder. These exercises also help to relieve pain, numbness, and tingling. These exercises are done using your healthy shoulder to help stretch the muscles of your injured shoulder. Exercise E: Warehouse manager (External Rotation and Abduction)  1. Stand in a doorway with one of your feet slightly in front of the other. This is called a staggered stance. If you cannot reach your forearms to the door frame, stand facing a corner of a room. 2. Choose one of the following positions as told by your health care provider: ? Place your hands and forearms on the door frame above your head. ? Place  your hands and forearms on the door frame at the height of your head. ? Place your hands on the door frame at the height of your elbows. 3. Slowly move your weight onto your front foot until you feel a stretch across your chest and in the front of your shoulders. Keep your head and chest upright and keep your abdominal muscles tight.  4. Hold for __________ seconds. 5. To release the stretch, shift your weight to your back foot. Repeat __________ times. Complete this stretch __________ times a day. Exercise F:Extension, Standing 1. Stand and hold a broomstick, a cane, or a similar object behind your back. ? Your hands should be a little wider than shoulder-width apart. ? Your palms should face away from your back. 2. Keeping your elbows straight and keeping your shoulder muscles relaxed, move the stick away from your body until you feel a stretch in your shoulder. ? Avoid shrugging your shoulders while you move the stick. Keep your shoulder blade tucked down toward the middle of your back. 3. Hold for __________ seconds. 4. Slowly return to the starting position. Repeat __________ times. Complete this exercise __________ times a day. STRENGTHENING EXERCISES These exercises build strength and endurance in your shoulder. Endurance is the ability to use your muscles for a long time, even after they get tired. Exercise G:External Rotation  1. Sit in a stable chair without armrests. 2. Secure an exercise band at elbow height on your left / right side. 3. Place a soft object, such as a folded towel or a small pillow, between your left / right upper arm and your body to move your elbow a few inches away (about 10 cm) from your side. 4. Hold the end of the band so it is tight and there is no slack. 5. Keeping your elbow pressed against the soft object, move your left / right forearm out, away from your abdomen. Keep your body steady so only your forearm moves. 6. Hold for __________  seconds. 7. Slowly return to the starting position. Repeat __________ times. Complete this exercise __________ times a day. Exercise H:Shoulder Abduction  1. Sit in a stable chair without armrests, or stand. 2. Hold a __________ weight in your left / right hand, or hold an exercise band with both hands. 3. Start with your arms straight down and your left / right palm facing in, toward your body. 4. Slowly lift your left / right hand out to your side. Do not lift your hand above shoulder height unless your health care provider tells you that this is safe. ? Keep your arms straight. ? Avoid shrugging your shoulder while you do this movement. Keep your shoulder blade tucked down toward the middle of your back. 5. Hold for __________ seconds. 6. Slowly lower your arm, and return to the starting position. Repeat __________ times. Complete this exercise __________ times a day. Exercise I:Shoulder Extension 1. Sit in a stable chair without armrests, or stand. 2. Secure an exercise band to a stable object in front of you where it is at shoulder height. 3. Hold one end of the exercise band in each hand. Your palms should face each other. 4. Straighten your elbows and lift your hands up to shoulder height. 5. Step back, away from the secured end of the exercise band, until the band is tight and there is no slack. 6. Squeeze your shoulder blades together as you pull your hands down to the sides of your thighs. Stop when your hands are straight down by your sides. Do not let your hands go behind your body. 7. Hold for __________ seconds. 8. Slowly return to the starting position. Repeat __________ times. Complete this exercise __________ times a day. Exercise J:Standing Shoulder Row 1. Sit in a stable chair without armrests, or stand. 2. Secure an exercise band to a stable object in front of you so  it is at waist height. 3. Hold one end of the exercise band in each hand. Your palms should be in a  thumbs-up position. 4. Bend each of your elbows to an "L" shape (about 90 degrees) and keep your upper arms at your sides. 5. Step back until the band is tight and there is no slack. 6. Slowly pull your elbows back behind you. 7. Hold for __________ seconds. 8. Slowly return to the starting position. Repeat __________ times. Complete this exercise __________ times a day. Exercise K:Shoulder Press-Ups  1. Sit in a stable chair that has armrests. Sit upright, with your feet flat on the floor. 2. Put your hands on the armrests so your elbows are bent and your fingers are pointing forward. Your hands should be about even with the sides of your body. 3. Push down on the armrests and use your arms to lift yourself off of the chair. Straighten your elbows and lift yourself up as much as you comfortably can. ? Move your shoulder blades down, and avoid letting your shoulders move up toward your ears. ? Keep your feet on the ground. As you get stronger, your feet should support less of your body weight as you lift yourself up. 4. Hold for __________ seconds. 5. Slowly lower yourself back into the chair. Repeat __________ times. Complete this exercise __________ times a day. Exercise L: Wall Push-Ups  1. Stand so you are facing a stable wall. Your feet should be about one arm-length away from the wall. 2. Lean forward and place your palms on the wall at shoulder height. 3. Keep your feet flat on the floor as you bend your elbows and lean forward toward the wall. 4. Hold for __________ seconds. 5. Straighten your elbows to push yourself back to the starting position. Repeat __________ times. Complete this exercise __________ times a day. This information is not intended to replace advice given to you by your health care provider. Make sure you discuss any questions you have with your health care provider. Document Released: 12/31/2004 Document Revised: 11/11/2015 Document Reviewed:  10/28/2014 Elsevier Interactive Patient Education  2018 [employer].

## 2017-05-28 NOTE — Progress Notes (Signed)
Subjective:    Patient ID: Cindy Brandt, female    DOB: 01/03/1932, 84 y.o.   MRN: [id number]  HPI  Cindy Brandt is an 84 year old female with a history of osteoarthritis who presents today with a chief complaint of neck and shoulder pain.   Her pain is located to the left lateral neck and shoulder with radiation down her left upper extremity. She's also noticed a superficial skin mass to her left antecubital fossa that has been present for "a while". Her neck and shoulder symptoms began 2 weeks ago. She denies numbness/tingling, injury/trauma, repetitive movement.   She's tried taking Ibuprofen and Aleve without much improvement. She's also avoided using her left shoulder due to pain.   Review of Systems  Musculoskeletal: Positive for arthralgias and neck pain.       Left upper extremity pain  Skin: Negative for color change.       Past Medical History:  Diagnosis Date  . Allergy   . Anemia   . Anxiety   . Arthritis   . Blood transfusion without reported diagnosis   . Cataract    a. bilerteral cataracts removed  . Coronary atherosclerosis of native coronary artery    a. 2003 Cath/unsuccessful PCI of occluded RCA;  b. 2005 NSTEMI/Cath: CTO RCA w/ L->R collats, LAD 20, LCX 34m, nl EF;  c. 11/2011 Myoview: no ischemia.  . Diastolic dysfunction 94/49/6759   a. 11/2011 Echo: Ejection fraction 60-65%, gr1 DD, basal inferior AK;  b. 09/2013 Echo: EF 55-60%, mild LVH, no rwma, Gr1 DD, mildly dil LA.  . Gastritis    a. 04/2016 EGD: Nl esophagus, gastritis, nl duodenal bulb & 2nd portion of the duodenum.  Marland Kitchen Headache(784.0)   . Hyperlipidemia   . Hypertension   . Hypokalemia   . Tubular adenoma of colon 2013     Social History   Socioeconomic History  . Marital status: Married    Spouse name: Not on file  . Number of children: 5  . Years of education: Not on file  . Highest education level: Not on file  Occupational History  . Occupation: Retired  Scientific laboratory technician  . Financial  resource strain: Not on file  . Food insecurity:    Worry: Not on file    Inability: Not on file  . Transportation needs:    Medical: Not on file    Non-medical: Not on file  Tobacco Use  . Smoking status: Former Smoker    Packs/day: 0.50    Years: 25.00    Pack years: 12.50    Types: Cigarettes    Last attempt to quit: 04/06/1996    Years since quitting: 21.1  . Smokeless tobacco: Never Used  Substance and Sexual Activity  . Alcohol use: No  . Drug use: No  . Sexual activity: Not on file  Lifestyle  . Physical activity:    Days per week: Not on file    Minutes per session: Not on file  . Stress: Not on file  Relationships  . Social connections:    Talks on phone: Not on file    Gets together: Not on file    Attends religious service: Not on file    Active member of club or organization: Not on file    Attends meetings of clubs or organizations: Not on file    Relationship status: Not on file  . Intimate partner violence:    Fear of current or ex partner: Not on file  Emotionally abused: Not on file    Physically abused: Not on file    Forced sexual activity: Not on file  Other Topics Concern  . Not on file  Social History Narrative   Currently living with dtr in [place].  No regular exercise.    Past Surgical History:  Procedure Laterality Date  . CARPAL TUNNEL RELEASE Bilateral   . CHOLECYSTECTOMY  2004  . COLONOSCOPY  08/06/2011   Procedure: COLONOSCOPY;  Surgeon: [name], MD;  Location: [facility];  Service: Endoscopy;  Laterality: N/A;  10:40 AM  . GALLBLADDER SURGERY    . LEFT HEART CATH AND CORONARY ANGIOGRAPHY N/A 06/15/2016   Procedure: Left Heart Cath and Coronary Angiography;  Surgeon: Lorretta Harp, MD;  Location: [facility];  Service: Cardiovascular;  Laterality: N/A;  . PARTIAL HYSTERECTOMY    . repair of belly button    . UMBILICAL HERNIA REPAIR     Umbilical hernia repair as a child    Family History  Problem Relation Age of  Onset  . Coronary artery disease Father   . Heart attack Father   . Colon cancer Father   . Coronary artery disease Mother   . Stomach cancer Mother   . Heart disease Brother   . Diabetes Brother   . Pancreatic cancer Neg Hx   . Rectal cancer Neg Hx   . Esophageal cancer Neg Hx     Allergies  Allergen Reactions  . Tylenol [Acetaminophen] Other (See Comments)    Funny feeling in chest.     Current Outpatient Medications on File Prior to Visit  Medication Sig Dispense Refill  . alendronate (FOSAMAX) 70 MG tablet TAKE 1 TABLET WEEKLY 12 tablet 0  . amLODipine (NORVASC) 2.5 MG tablet TAKE 1 TABLET (2.5 MG TOTAL) BY MOUTH EVERY EVENING. 90 tablet 3  . aspirin EC 81 MG tablet Take 81 mg by mouth every morning.    Marland Kitchen atenolol (TENORMIN) 100 MG tablet Take 100 mg (1 tab) by mouth in the morning and 50 mg (1/2 tab) in the evening. 135 tablet 6  . atorvastatin (LIPITOR) 80 MG tablet Take 1 tablet (80 mg total) by mouth daily at 6 PM. 90 tablet 3  . busPIRone (BUSPAR) 7.5 MG tablet Take 1 tablet (7.5 mg total) by mouth 2 (two) times daily. 180 tablet 3  . calcium carbonate (CALCIUM 600) 600 MG TABS tablet Take 1,200 mg by mouth daily.    . cetirizine (ZYRTEC) 10 MG tablet Take 10 mg by mouth daily.    . clopidogrel (PLAVIX) 75 MG tablet TAKE 1 TABLET (75 MG TOTAL) BY MOUTH DAILY. 30 tablet 6  . isosorbide mononitrate (IMDUR) 60 MG 24 hr tablet Take 1.5 tablets (90 mg total) by mouth daily. 180 tablet 1  . nitroGLYCERIN (NITROSTAT) 0.4 MG SL tablet Place 1 tablet (0.4 mg total) under the tongue every 5 (five) minutes as needed for chest pain. Chest Pain 25 tablet 11  . oxybutynin (DITROPAN-XL) 5 MG 24 hr tablet Take 1 tablet (5 mg total) by mouth at bedtime. 90 tablet 3  . pantoprazole (PROTONIX) 40 MG tablet Take 1 tablet (40 mg total) by mouth daily. 90 tablet 3  . sertraline (ZOLOFT) 100 MG tablet TAKE 1 TABLET BY MOUTH EVERY DAY IN THE MORNING 90 tablet 1  . spironolactone (ALDACTONE) 25 MG  tablet TAKE 1/2 TABLET BY MOUTH DAILY. 30 tablet 2   No current facility-administered medications on file prior to visit.  BP 110/66   Pulse 67   Temp 98.1 F (36.7 C) (Oral)   Wt 134 lb (60.8 kg)   SpO2 97%   BMI 27.06 kg/m    Objective:   Physical Exam  Constitutional: She appears well-nourished.  Neck: Muscular tenderness present. Decreased range of motion present.    Decrease in ROM with extension due to pain  Cardiovascular: Normal rate.  Pulmonary/Chest: Effort normal.  Musculoskeletal:       Left shoulder: She exhibits decreased range of motion and pain. She exhibits no tenderness.       Arms: Decrease in ROM with most planes of abduction. Cannot seem to abduct greater than 90 degree angle.   Skin: Skin is warm and dry. No erythema.          Assessment & Plan:  Acute Neck and Shoulder Pain:  Present for the past 2 weeks without trauma. Exam today with moderate decrease in ROM as noted. Suspect osteoarthritic flare given lack of trauma. Check plain films of neck and shoulder. Rx for low dose Tramadol provided with drowsiness precautions. Should not be taking NSAID's given use of Plavix, allergy to Tylenol. Stressed importance of shoulder and neck exercises, handout provided.  [name], NP

## 2017-06-08 ENCOUNTER — Other Ambulatory Visit: Payer: Self-pay | Admitting: Primary Care

## 2017-06-08 DIAGNOSIS — E2839 Other primary ovarian failure: Secondary | ICD-10-CM

## 2017-06-08 NOTE — Telephone Encounter (Signed)
Ok to refill? Electronically refill request for alendronate (FOSAMAX) 70 MG tablet  Last prescribed on 03/18/2017. Last bone density on 04/26/2017. Last seen on 05/28/2017.

## 2017-06-08 NOTE — Telephone Encounter (Signed)
Rx sent to pharmacy. Will continue for the remainder of 2019, then will need bisphosphonate break.

## 2017-06-15 ENCOUNTER — Other Ambulatory Visit: Payer: Self-pay | Admitting: Cardiovascular Disease

## 2017-06-22 ENCOUNTER — Telehealth: Payer: Self-pay | Admitting: Primary Care

## 2017-06-22 NOTE — Telephone Encounter (Signed)
Pts daughter dropped off application for disability parking placecard. Placed in Rx tower.

## 2017-06-22 NOTE — Telephone Encounter (Signed)
Place form/paperwork in [name] Clark's inbox for review and complete if necessary.  

## 2017-06-24 NOTE — Telephone Encounter (Signed)
Placed in [place], ready for pick up.

## 2017-06-25 NOTE — Telephone Encounter (Signed)
Notified patient's daughter that form is ready for pick up. Left in the front office.

## 2017-07-22 ENCOUNTER — Other Ambulatory Visit: Payer: Self-pay | Admitting: Cardiovascular Disease

## 2017-07-22 DIAGNOSIS — I10 Essential (primary) hypertension: Secondary | ICD-10-CM

## 2017-07-27 ENCOUNTER — Telehealth: Payer: Self-pay | Admitting: Cardiovascular Disease

## 2017-07-27 NOTE — Telephone Encounter (Signed)
New message   Pt c/o medication issue:  1. Name of Medication: nitroGLYCERIN (NITROSTAT) 0.4 MG SL tablet  2. How are you currently taking this medication (dosage and times per day)? Place 1 tablet (0.4 mg total) under the tongue every 5 (five) minutes as needed for chest pain.  3. Are you having a reaction (difficulty breathing--STAT)? NO  4. What is your medication issue? Patients sister calling with concerns about patient taking nitro everyday for all types of "unknown" chest discomfort. Cindy Brandt feels they needs better understanding of the use of Nitro,

## 2017-07-27 NOTE — Telephone Encounter (Signed)
Returned call to patient's daughter [name].She stated she is concerned mother takes too much NTG.Stated for the past 1 year she has took 25 tablets every month.Stated NTG relieves chest pain.Appointment scheduled with Kerin Ransom PA 07/29/17 at 2:30 pm.

## 2017-07-29 ENCOUNTER — Ambulatory Visit: Payer: Medicare Other | Admitting: Cardiology

## 2017-07-29 ENCOUNTER — Encounter: Payer: Self-pay | Admitting: Cardiology

## 2017-07-29 DIAGNOSIS — I25118 Atherosclerotic heart disease of native coronary artery with other forms of angina pectoris: Secondary | ICD-10-CM

## 2017-07-29 DIAGNOSIS — E782 Mixed hyperlipidemia: Secondary | ICD-10-CM

## 2017-07-29 DIAGNOSIS — R079 Chest pain, unspecified: Secondary | ICD-10-CM

## 2017-07-29 DIAGNOSIS — I10 Essential (primary) hypertension: Secondary | ICD-10-CM | POA: Diagnosis not present

## 2017-07-29 MED ORDER — AMLODIPINE BESYLATE 5 MG PO TABS: 5.0000 mg | ORAL_TABLET | Freq: Every evening | ORAL | 3 refills | Status: DC

## 2017-07-29 NOTE — Assessment & Plan Note (Signed)
Controlled.  

## 2017-07-29 NOTE — Assessment & Plan Note (Signed)
Cath April 2018- occluded RCA, occluded non dominant CFX, high grade RI, 50% LAD with L-R collaterals Normal LVF.

## 2017-07-29 NOTE — Patient Instructions (Signed)
Medication Instructions:  INCREASE- Amlodipine 5 mg daily  If you need a refill on your cardiac medications before your next appointment, please call your pharmacy.  Labwork: None Ordered   Testing/Procedures: None Ordered  Follow-Up: Your physician wants you to follow-up in: 6 Weeks with Dr Gwenlyn Found.     Thank you for choosing [facility]!!

## 2017-07-29 NOTE — Progress Notes (Signed)
07/29/2017 Cindy Brandt   1931/03/19  [id number]  Primary Physician [name], NP Primary Cardiologist: Dr Gwenlyn Found  HPI:  Pleasant 84 y/o AA female with a h/o CAD, HTN, HL, and gastritis (EGD 04/2016), who was sent to [facility] from  [facility] in April 2018 for chest pain and mild troponin elevation. Cardiac catheterization 06/15/16 revealed an occluded RCA with left right collaterals, occluded nondominant circumflex, high-grade ostial ramus branch stenosis and 50% proximal LAD stenosis. She had normal LV function with grade 2 diastolic dysfunction. She has been treated medically. Her daughter called and noted that her mother has been using NTG frequently, she uses an Rx of # 25 a month for the past few months. The pt describes mid chest pain, no radiation to her arms or jaw, now diaphoresis but she admits she feels SOB with it. This comes on after eating or with minimal exertion ie: emptying the dishwasher which her daughter says is the extent of her activity at home.  The pt takes TUMS and if that doesn't help she takes NTG-which does help.    Current Outpatient Medications  Medication Sig Dispense Refill  . alendronate (FOSAMAX) 70 MG tablet Take 1 tablet by mouth once weekly. Do not eat or lay down for 30 minutes after taking. 12 tablet 2  . amLODipine (NORVASC) 5 MG tablet Take 1 tablet (5 mg total) by mouth every evening. 90 tablet 3  . aspirin EC 81 MG tablet Take 81 mg by mouth every morning.    Marland Kitchen atenolol (TENORMIN) 100 MG tablet TAKE 100 MG (1 TAB) BY MOUTH IN THE MORNING AND 50 MG (1/2 TAB) IN THE EVENING. 135 tablet 3  . atorvastatin (LIPITOR) 80 MG tablet Take 1 tablet (80 mg total) by mouth daily at 6 PM. 90 tablet 3  . busPIRone (BUSPAR) 7.5 MG tablet Take 1 tablet (7.5 mg total) by mouth 2 (two) times daily. 180 tablet 3  . calcium carbonate (CALCIUM 600) 600 MG TABS tablet Take 1,200 mg by mouth daily.    . cetirizine (ZYRTEC) 10 MG tablet Take 10 mg by mouth daily.    . clopidogrel  (PLAVIX) 75 MG tablet TAKE 1 TABLET BY MOUTH EVERY DAY 30 tablet 5  . nitroGLYCERIN (NITROSTAT) 0.4 MG SL tablet Place 1 tablet (0.4 mg total) under the tongue every 5 (five) minutes as needed for chest pain. Chest Pain 25 tablet 11  . oxybutynin (DITROPAN-XL) 5 MG 24 hr tablet Take 1 tablet (5 mg total) by mouth at bedtime. 90 tablet 3  . Probiotic Product (PROBIOTIC PO) Take by mouth.    . raNITIdine HCl (ZANTAC PO) Take by mouth.    . sertraline (ZOLOFT) 100 MG tablet TAKE 1 TABLET BY MOUTH EVERY DAY IN THE MORNING 90 tablet 1  . spironolactone (ALDACTONE) 25 MG tablet TAKE 1/2 TABLET BY MOUTH DAILY. 30 tablet 2  . traMADol (ULTRAM) 50 MG tablet Take 1 tablet (50 mg total) by mouth every 8 (eight) hours as needed for severe pain. 15 tablet 0  . isosorbide mononitrate (IMDUR) 60 MG 24 hr tablet Take 1.5 tablets (90 mg total) by mouth daily. (Patient not taking: Reported on 07/29/2017) 180 tablet 1  . pantoprazole (PROTONIX) 40 MG tablet Take 1 tablet (40 mg total) by mouth daily. (Patient not taking: Reported on 07/29/2017) 90 tablet 3   No current facility-administered medications for this visit.     Allergies  Allergen Reactions  . Tylenol [Acetaminophen] Other (See Comments)  Funny feeling in chest.     Past Medical History:  Diagnosis Date  . Allergy   . Anemia   . Anxiety   . Arthritis   . Blood transfusion without reported diagnosis   . Cataract    a. bilerteral cataracts removed  . Coronary atherosclerosis of native coronary artery    a. 2003 Cath/unsuccessful PCI of occluded RCA;  b. 2005 NSTEMI/Cath: CTO RCA w/ L->R collats, LAD 20, LCX 64m, nl EF;  c. 11/2011 Myoview: no ischemia.  . Diastolic dysfunction 59/56/3875   a. 11/2011 Echo: Ejection fraction 60-65%, gr1 DD, basal inferior AK;  b. 09/2013 Echo: EF 55-60%, mild LVH, no rwma, Gr1 DD, mildly dil LA.  . Gastritis    a. 04/2016 EGD: Nl esophagus, gastritis, nl duodenal bulb & 2nd portion of the duodenum.  Marland Kitchen  Headache(784.0)   . Hyperlipidemia   . Hypertension   . Hypokalemia   . Tubular adenoma of colon 2013    Social History   Socioeconomic History  . Marital status: Married    Spouse name: Not on file  . Number of children: 5  . Years of education: Not on file  . Highest education level: Not on file  Occupational History  . Occupation: Retired  Scientific laboratory technician  . Financial resource strain: Not on file  . Food insecurity:    Worry: Not on file    Inability: Not on file  . Transportation needs:    Medical: Not on file    Non-medical: Not on file  Tobacco Use  . Smoking status: Former Smoker    Packs/day: 0.50    Years: 25.00    Pack years: 12.50    Types: Cigarettes    Last attempt to quit: 04/06/1996    Years since quitting: 21.3  . Smokeless tobacco: Never Used  Substance and Sexual Activity  . Alcohol use: No  . Drug use: No  . Sexual activity: Not on file  Lifestyle  . Physical activity:    Days per week: Not on file    Minutes per session: Not on file  . Stress: Not on file  Relationships  . Social connections:    Talks on phone: Not on file    Gets together: Not on file    Attends religious service: Not on file    Active member of club or organization: Not on file    Attends meetings of clubs or organizations: Not on file    Relationship status: Not on file  . Intimate partner violence:    Fear of current or ex partner: Not on file    Emotionally abused: Not on file    Physically abused: Not on file    Forced sexual activity: Not on file  Other Topics Concern  . Not on file  Social History Narrative   Currently living with dtr in [place].  No regular exercise.     Family History  Problem Relation Age of Onset  . Coronary artery disease Father   . Heart attack Father   . Colon cancer Father   . Coronary artery disease Mother   . Stomach cancer Mother   . Heart disease Brother   . Diabetes Brother   . Pancreatic cancer Neg Hx   . Rectal cancer Neg Hx     . Esophageal cancer Neg Hx      Review of Systems: General: negative for chills, fever, night sweats or weight changes.  Cardiovascular: negative for chest pain, dyspnea on  exertion, edema, orthopnea, palpitations, paroxysmal nocturnal dyspnea or shortness of breath Dermatological: negative for rash Respiratory: negative for cough or wheezing Urologic: negative for hematuria Abdominal: negative for nausea, vomiting, diarrhea, bright red blood per rectum, melena, or hematemesis Neurologic: negative for visual changes, syncope, or dizziness All other systems reviewed and are otherwise negative except as noted above.    Blood pressure 132/80, pulse (!) 55, height 4\' 11"  (1.499 m), weight 137 lb 12.8 oz (62.5 kg).  General appearance: alert, cooperative and no distress Lungs: clear to auscultation bilaterally Heart: regular rate and rhythm Extremities: intact Rt radial pulse Skin: Skin color, texture, turgor normal. No rashes or lesions Neurologic: Grossly normal  EKG NSR, SB 55  ASSESSMENT AND PLAN:   Chest pain Concerning for angina  CAD (coronary atherosclerotic disease) Cath April 2018- occluded RCA, occluded non dominant CFX, high grade RI, 50% LAD with L-R collaterals Normal LVF.    Essential hypertension Controlled  Hyperlipidemia On high dose statin Rx   PLAN  I increased her Norvasc to 5 mg. I'll review with Dr Gwenlyn Found- my concern is progression of her LAD lesion which would put her at risk for a devastating infarct. She is already on max medical Rx.   Kerin Ransom PA-C 07/29/2017 2:50 PM

## 2017-07-29 NOTE — Assessment & Plan Note (Signed)
On high dose statin Rx 

## 2017-07-29 NOTE — H&P (View-Only) (Signed)
07/29/2017 Cindy Brandt   [birth year]  [id number]  Primary Physician [name], NP Primary Cardiologist: Dr Gwenlyn Found  HPI:  Pleasant 84 y/o AA female with a h/o CAD, HTN, HL, and gastritis (EGD 04/2016), who was sent to [facility] from  [facility] in April 2018 for chest pain and mild troponin elevation. Cardiac catheterization 06/15/16 revealed an occluded RCA with left right collaterals, occluded nondominant circumflex, high-grade ostial ramus branch stenosis and 50% proximal LAD stenosis. She had normal LV function with grade 2 diastolic dysfunction. She has been treated medically. Her daughter called and noted that her mother has been using NTG frequently, she uses an Rx of # 25 a month for the past few months. The pt describes mid chest pain, no radiation to her arms or jaw, now diaphoresis but she admits she feels SOB with it. This comes on after eating or with minimal exertion ie: emptying the dishwasher which her daughter says is the extent of her activity at home.  The pt takes TUMS and if that doesn't help she takes NTG-which does help.    Current Outpatient Medications  Medication Sig Dispense Refill  . alendronate (FOSAMAX) 70 MG tablet Take 1 tablet by mouth once weekly. Do not eat or lay down for 30 minutes after taking. 12 tablet 2  . amLODipine (NORVASC) 5 MG tablet Take 1 tablet (5 mg total) by mouth every evening. 90 tablet 3  . aspirin EC 81 MG tablet Take 81 mg by mouth every morning.    Marland Kitchen atenolol (TENORMIN) 100 MG tablet TAKE 100 MG (1 TAB) BY MOUTH IN THE MORNING AND 50 MG (1/2 TAB) IN THE EVENING. 135 tablet 3  . atorvastatin (LIPITOR) 80 MG tablet Take 1 tablet (80 mg total) by mouth daily at 6 PM. 90 tablet 3  . busPIRone (BUSPAR) 7.5 MG tablet Take 1 tablet (7.5 mg total) by mouth 2 (two) times daily. 180 tablet 3  . calcium carbonate (CALCIUM 600) 600 MG TABS tablet Take 1,200 mg by mouth daily.    . cetirizine (ZYRTEC) 10 MG tablet Take 10 mg by mouth daily.    . clopidogrel  (PLAVIX) 75 MG tablet TAKE 1 TABLET BY MOUTH EVERY DAY 30 tablet 5  . nitroGLYCERIN (NITROSTAT) 0.4 MG SL tablet Place 1 tablet (0.4 mg total) under the tongue every 5 (five) minutes as needed for chest pain. Chest Pain 25 tablet 11  . oxybutynin (DITROPAN-XL) 5 MG 24 hr tablet Take 1 tablet (5 mg total) by mouth at bedtime. 90 tablet 3  . Probiotic Product (PROBIOTIC PO) Take by mouth.    . raNITIdine HCl (ZANTAC PO) Take by mouth.    . sertraline (ZOLOFT) 100 MG tablet TAKE 1 TABLET BY MOUTH EVERY DAY IN THE MORNING 90 tablet 1  . spironolactone (ALDACTONE) 25 MG tablet TAKE 1/2 TABLET BY MOUTH DAILY. 30 tablet 2  . traMADol (ULTRAM) 50 MG tablet Take 1 tablet (50 mg total) by mouth every 8 (eight) hours as needed for severe pain. 15 tablet 0  . isosorbide mononitrate (IMDUR) 60 MG 24 hr tablet Take 1.5 tablets (90 mg total) by mouth daily. (Patient not taking: Reported on 07/29/2017) 180 tablet 1  . pantoprazole (PROTONIX) 40 MG tablet Take 1 tablet (40 mg total) by mouth daily. (Patient not taking: Reported on 07/29/2017) 90 tablet 3   No current facility-administered medications for this visit.     Allergies  Allergen Reactions  . Tylenol [Acetaminophen] Other (See Comments)  Funny feeling in chest.     Past Medical History:  Diagnosis Date  . Allergy   . Anemia   . Anxiety   . Arthritis   . Blood transfusion without reported diagnosis   . Cataract    a. bilerteral cataracts removed  . Coronary atherosclerosis of native coronary artery    a. 2003 Cath/unsuccessful PCI of occluded RCA;  b. 2005 NSTEMI/Cath: CTO RCA w/ L->R collats, LAD 20, LCX 85m, nl EF;  c. 11/2011 Myoview: no ischemia.  . Diastolic dysfunction 35/00/9381   a. 11/2011 Echo: Ejection fraction 60-65%, gr1 DD, basal inferior AK;  b. 09/2013 Echo: EF 55-60%, mild LVH, no rwma, Gr1 DD, mildly dil LA.  . Gastritis    a. 04/2016 EGD: Nl esophagus, gastritis, nl duodenal bulb & 2nd portion of the duodenum.  Marland Kitchen  Headache(784.0)   . Hyperlipidemia   . Hypertension   . Hypokalemia   . Tubular adenoma of colon 2013    Social History   Socioeconomic History  . Marital status: Married    Spouse name: Not on file  . Number of children: 5  . Years of education: Not on file  . Highest education level: Not on file  Occupational History  . Occupation: Retired  Scientific laboratory technician  . Financial resource strain: Not on file  . Food insecurity:    Worry: Not on file    Inability: Not on file  . Transportation needs:    Medical: Not on file    Non-medical: Not on file  Tobacco Use  . Smoking status: Former Smoker    Packs/day: 0.50    Years: 25.00    Pack years: 12.50    Types: Cigarettes    Last attempt to quit: 04/06/1996    Years since quitting: 21.3  . Smokeless tobacco: Never Used  Substance and Sexual Activity  . Alcohol use: No  . Drug use: No  . Sexual activity: Not on file  Lifestyle  . Physical activity:    Days per week: Not on file    Minutes per session: Not on file  . Stress: Not on file  Relationships  . Social connections:    Talks on phone: Not on file    Gets together: Not on file    Attends religious service: Not on file    Active member of club or organization: Not on file    Attends meetings of clubs or organizations: Not on file    Relationship status: Not on file  . Intimate partner violence:    Fear of current or ex partner: Not on file    Emotionally abused: Not on file    Physically abused: Not on file    Forced sexual activity: Not on file  Other Topics Concern  . Not on file  Social History Narrative   Currently living with dtr in [place].  No regular exercise.     Family History  Problem Relation Age of Onset  . Coronary artery disease Father   . Heart attack Father   . Colon cancer Father   . Coronary artery disease Mother   . Stomach cancer Mother   . Heart disease Brother   . Diabetes Brother   . Pancreatic cancer Neg Hx   . Rectal cancer Neg Hx     . Esophageal cancer Neg Hx      Review of Systems: General: negative for chills, fever, night sweats or weight changes.  Cardiovascular: negative for chest pain, dyspnea on  exertion, edema, orthopnea, palpitations, paroxysmal nocturnal dyspnea or shortness of breath Dermatological: negative for rash Respiratory: negative for cough or wheezing Urologic: negative for hematuria Abdominal: negative for nausea, vomiting, diarrhea, bright red blood per rectum, melena, or hematemesis Neurologic: negative for visual changes, syncope, or dizziness All other systems reviewed and are otherwise negative except as noted above.    Blood pressure 132/80, pulse (!) 55, height 4\' 11"  (1.499 m), weight 137 lb 12.8 oz (62.5 kg).  General appearance: alert, cooperative and no distress Lungs: clear to auscultation bilaterally Heart: regular rate and rhythm Extremities: intact Rt radial pulse Skin: Skin color, texture, turgor normal. No rashes or lesions Neurologic: Grossly normal  EKG NSR, SB 55  ASSESSMENT AND PLAN:   Chest pain Concerning for angina  CAD (coronary atherosclerotic disease) Cath April 2018- occluded RCA, occluded non dominant CFX, high grade RI, 50% LAD with L-R collaterals Normal LVF.    Essential hypertension Controlled  Hyperlipidemia On high dose statin Rx   PLAN  I increased her Norvasc to 5 mg. I'll review with Dr Gwenlyn Found- my concern is progression of her LAD lesion which would put her at risk for a devastating infarct. She is already on max medical Rx.   Kerin Ransom PA-C 07/29/2017 2:50 PM

## 2017-07-29 NOTE — Assessment & Plan Note (Signed)
Concerning for angina

## 2017-07-30 ENCOUNTER — Telehealth: Payer: Self-pay | Admitting: Cardiology

## 2017-07-30 ENCOUNTER — Other Ambulatory Visit: Payer: Self-pay | Admitting: *Deleted

## 2017-07-30 DIAGNOSIS — R072 Precordial pain: Secondary | ICD-10-CM

## 2017-07-30 NOTE — Telephone Encounter (Signed)
Spoke with pt dtr, marva, aware of the recommendations. They agree to proceed. LC&P scheduled for Monday 08-02-17 @ 1 pm. She will arrive at 10:30 to 11 am, she will need labs same day. Instructions and location discussed in detail with the daughter.

## 2017-07-30 NOTE — Telephone Encounter (Signed)
Left message, discussed with Dr Nicholos Johns to proceed with cath.  Kerin Ransom PA-C 07/30/2017 8:05 AM

## 2017-08-02 ENCOUNTER — Ambulatory Visit (HOSPITAL_COMMUNITY)
Admission: RE | Disposition: A | Discharge status: Home / Self Care | Payer: Self-pay | Source: Ambulatory Visit | Attending: Cardiovascular Disease

## 2017-08-02 ENCOUNTER — Ambulatory Visit (HOSPITAL_COMMUNITY)
Admission: RE | Admit: 2017-08-02 | Discharge: 2017-08-02 | Disposition: A | Discharge status: Home / Self Care | Payer: Medicare Other | Source: Ambulatory Visit | Attending: Cardiovascular Disease | Admitting: Cardiovascular Disease

## 2017-08-02 DIAGNOSIS — M1990 Unspecified osteoarthritis, unspecified site: Secondary | ICD-10-CM | POA: Insufficient documentation

## 2017-08-02 DIAGNOSIS — I200 Unstable angina: Secondary | ICD-10-CM | POA: Diagnosis present

## 2017-08-02 DIAGNOSIS — I10 Essential (primary) hypertension: Secondary | ICD-10-CM | POA: Insufficient documentation

## 2017-08-02 DIAGNOSIS — Z87891 Personal history of nicotine dependence: Secondary | ICD-10-CM | POA: Diagnosis not present

## 2017-08-02 DIAGNOSIS — I25110 Atherosclerotic heart disease of native coronary artery with unstable angina pectoris: Secondary | ICD-10-CM | POA: Insufficient documentation

## 2017-08-02 DIAGNOSIS — E785 Hyperlipidemia, unspecified: Secondary | ICD-10-CM | POA: Insufficient documentation

## 2017-08-02 DIAGNOSIS — K2970 Gastritis, unspecified, without bleeding: Secondary | ICD-10-CM | POA: Insufficient documentation

## 2017-08-02 DIAGNOSIS — R072 Precordial pain: Secondary | ICD-10-CM

## 2017-08-02 DIAGNOSIS — Z7902 Long term (current) use of antithrombotics/antiplatelets: Secondary | ICD-10-CM | POA: Diagnosis not present

## 2017-08-02 DIAGNOSIS — I252 Old myocardial infarction: Secondary | ICD-10-CM | POA: Diagnosis not present

## 2017-08-02 DIAGNOSIS — Z7982 Long term (current) use of aspirin: Secondary | ICD-10-CM | POA: Insufficient documentation

## 2017-08-02 DIAGNOSIS — F419 Anxiety disorder, unspecified: Secondary | ICD-10-CM | POA: Diagnosis not present

## 2017-08-02 DIAGNOSIS — Z8249 Family history of ischemic heart disease and other diseases of the circulatory system: Secondary | ICD-10-CM | POA: Diagnosis not present

## 2017-08-02 DIAGNOSIS — I2582 Chronic total occlusion of coronary artery: Secondary | ICD-10-CM | POA: Diagnosis not present

## 2017-08-02 HISTORY — PX: CORONARY ANGIOGRAPHY: CATH118303

## 2017-08-02 LAB — CBC
HEMATOCRIT: 33.5 % — AB (ref 36.0–46.0)
Hemoglobin: 11.1 g/dL — ABNORMAL LOW (ref 12.0–15.0)
MCH: 32.9 pg (ref 26.0–34.0)
MCHC: 33.1 g/dL (ref 30.0–36.0)
MCV: 99.4 fL (ref 78.0–100.0)
PLATELETS: 187 10*3/uL (ref 150–400)
RBC: 3.37 MIL/uL — ABNORMAL LOW (ref 3.87–5.11)
RDW: 12.1 % (ref 11.5–15.5)
WBC: 4.3 10*3/uL (ref 4.0–10.5)

## 2017-08-02 LAB — PROTIME-INR
INR: 1.04
Prothrombin Time: 13.5 seconds (ref 11.4–15.2)

## 2017-08-02 LAB — BASIC METABOLIC PANEL
Anion gap: 7 (ref 5–15)
BUN: 13 mg/dL (ref 6–20)
CHLORIDE: 109 mmol/L (ref 101–111)
CO2: 25 mmol/L (ref 22–32)
Calcium: 8.7 mg/dL — ABNORMAL LOW (ref 8.9–10.3)
Creatinine, Ser: 0.79 mg/dL (ref 0.44–1.00)
GFR calc Af Amer: >60 mL/min (ref >60)
GFR calc non Af Amer: >60 mL/min (ref >60)
GLUCOSE: 132 mg/dL — AB (ref 65–99)
POTASSIUM: 3.9 mmol/L (ref 3.5–5.1)
SODIUM: 141 mmol/L (ref 135–145)

## 2017-08-02 SURGERY — CORONARY ANGIOGRAPHY (CATH LAB)
Anesthesia: LOCAL

## 2017-08-02 MED ORDER — NITROGLYCERIN 1 MG/10 ML FOR IR/CATH LAB
INTRA_ARTERIAL | Status: AC
  Filled 2017-08-02: qty 10

## 2017-08-02 MED ORDER — MIDAZOLAM HCL 2 MG/2ML IJ SOLN
INTRAMUSCULAR | Status: DC | PRN
  Administered 2017-08-02: 1 mg via INTRAVENOUS

## 2017-08-02 MED ORDER — HEPARIN SODIUM (PORCINE) 1000 UNIT/ML IJ SOLN
INTRAMUSCULAR | Status: AC
  Filled 2017-08-02: qty 1

## 2017-08-02 MED ORDER — HEPARIN (PORCINE) IN NACL 1000-0.9 UT/500ML-% IV SOLN
INTRAVENOUS | Status: AC
  Filled 2017-08-02: qty 1000

## 2017-08-02 MED ORDER — MORPHINE SULFATE (PF) 10 MG/ML IV SOLN: 2.0000 mg | INTRAVENOUS | Status: DC | PRN

## 2017-08-02 MED ORDER — HEPARIN (PORCINE) IN NACL 2-0.9 UNITS/ML
INTRAMUSCULAR | Status: AC | PRN
  Administered 2017-08-02 (×2): 500 mL

## 2017-08-02 MED ORDER — SODIUM CHLORIDE 0.9% FLUSH: 3.0000 mL | INTRAVENOUS | Status: DC | PRN

## 2017-08-02 MED ORDER — HEPARIN SODIUM (PORCINE) 1000 UNIT/ML IJ SOLN
INTRAMUSCULAR | Status: DC | PRN
  Administered 2017-08-02: 3000 [IU] via INTRAVENOUS

## 2017-08-02 MED ORDER — VERAPAMIL HCL 2.5 MG/ML IV SOLN
INTRA_ARTERIAL | Status: DC | PRN
  Administered 2017-08-02: 5 mL via INTRA_ARTERIAL

## 2017-08-02 MED ORDER — FENTANYL CITRATE (PF) 100 MCG/2ML IJ SOLN
INTRAMUSCULAR | Status: AC
  Filled 2017-08-02: qty 2

## 2017-08-02 MED ORDER — ONDANSETRON HCL 4 MG/2ML IJ SOLN: 4.0000 mg | Freq: Four times a day (QID) | INTRAMUSCULAR | Status: DC | PRN

## 2017-08-02 MED ORDER — IOHEXOL 350 MG/ML SOLN
INTRAVENOUS | Status: DC | PRN
  Administered 2017-08-02: 40 mL via INTRA_ARTERIAL

## 2017-08-02 MED ORDER — ACETAMINOPHEN 325 MG PO TABS: 650.0000 mg | ORAL_TABLET | ORAL | Status: DC | PRN

## 2017-08-02 MED ORDER — ASPIRIN 81 MG PO CHEW: 81.0000 mg | CHEWABLE_TABLET | ORAL | Status: DC

## 2017-08-02 MED ORDER — SODIUM CHLORIDE 0.9 % IV SOLN: 250.0000 mL | INTRAVENOUS | Status: DC | PRN

## 2017-08-02 MED ORDER — VERAPAMIL HCL 2.5 MG/ML IV SOLN
INTRAVENOUS | Status: AC
  Filled 2017-08-02: qty 2

## 2017-08-02 MED ORDER — MIDAZOLAM HCL 2 MG/2ML IJ SOLN
INTRAMUSCULAR | Status: AC
  Filled 2017-08-02: qty 2

## 2017-08-02 MED ORDER — SODIUM CHLORIDE 0.9 % WEIGHT BASED INFUSION: 1.0000 mL/kg/h | INTRAVENOUS | Status: DC

## 2017-08-02 MED ORDER — SODIUM CHLORIDE 0.9% FLUSH: 3.0000 mL | Freq: Two times a day (BID) | INTRAVENOUS | Status: DC

## 2017-08-02 MED ORDER — ASPIRIN 81 MG PO CHEW: 81.0000 mg | CHEWABLE_TABLET | Freq: Every day | ORAL | Status: DC

## 2017-08-02 MED ORDER — SODIUM CHLORIDE 0.9 % WEIGHT BASED INFUSION
3.0000 mL/kg/h | INTRAVENOUS | Status: AC
  Administered 2017-08-02: 3 mL/kg/h via INTRAVENOUS

## 2017-08-02 MED ORDER — SODIUM CHLORIDE 0.9 % IV SOLN: INTRAVENOUS | Status: AC

## 2017-08-02 MED ORDER — FENTANYL CITRATE (PF) 100 MCG/2ML IJ SOLN
INTRAMUSCULAR | Status: DC | PRN
  Administered 2017-08-02: 25 ug via INTRAVENOUS

## 2017-08-02 MED ORDER — LIDOCAINE HCL (PF) 1 % IJ SOLN
INTRAMUSCULAR | Status: AC
  Filled 2017-08-02: qty 30

## 2017-08-02 SURGICAL SUPPLY — 11 items
CATH INFINITI 5FR ANG PIGTAIL (CATHETERS) ×1 IMPLANT
CATH OPTITORQUE TIG 4.0 5F (CATHETERS) ×1 IMPLANT
DEVICE RAD COMP TR BAND LRG (VASCULAR PRODUCTS) ×1 IMPLANT
GLIDESHEATH SLEND A-KIT 6F 22G (SHEATH) ×1 IMPLANT
GUIDEWIRE INQWIRE 1.5J.035X260 (WIRE) IMPLANT
INQWIRE 1.5J .035X260CM (WIRE) ×4
KIT HEART LEFT (KITS) ×2 IMPLANT
PACK CARDIAC CATHETERIZATION (CUSTOM PROCEDURE TRAY) ×2 IMPLANT
TRANSDUCER W/STOPCOCK (MISCELLANEOUS) ×2 IMPLANT
TUBING CIL FLEX 10 FLL-RA (TUBING) ×2 IMPLANT
WIRE HI TORQ VERSACORE-J 145CM (WIRE) ×1 IMPLANT

## 2017-08-02 NOTE — Research (Signed)
CADFEM Informed Consent   Subject Name: Cindy Brandt  Subject met inclusion and exclusion criteria.  The informed consent form, study requirements and expectations were reviewed with the subject and questions and concerns were addressed prior to the signing of the consent form.  The subject verbalized understanding of the trail requirements.  The subject agreed to participate in the CADFEM trial and signed the informed consent.  The informed consent was obtained prior to performance of any protocol-specific procedures for the subject.  A copy of the signed informed consent was given to the subject and a copy was placed in the subject's medical record.  Neva Seat 08/02/2017, 1:21 PM

## 2017-08-02 NOTE — Discharge Instructions (Signed)

## 2017-08-02 NOTE — Interval H&P Note (Signed)
Cath Lab Visit (complete for each Cath Lab visit)  Clinical Evaluation Leading to the Procedure:   ACS: No.  Non-ACS:    Anginal Classification: CCS III  Anti-ischemic medical therapy: Maximal Therapy (2 or more classes of medications)  Non-Invasive Test Results: No non-invasive testing performed  Prior CABG: No previous CABG      History and Physical Interval Note:  08/02/2017 1:51 PM  Cindy Brandt  has presented today for surgery, with the diagnosis of cp  The various methods of treatment have been discussed with the patient and family. After consideration of risks, benefits and other options for treatment, the patient has consented to  Procedure(s): LEFT HEART CATH AND CORONARY ANGIOGRAPHY (N/A) as a surgical intervention .  The patient's history has been reviewed, patient examined, no change in status, stable for surgery.  I have reviewed the patient's chart and labs.  Questions were answered to the patient's satisfaction.     Quay Burow

## 2017-08-03 ENCOUNTER — Encounter (HOSPITAL_COMMUNITY): Payer: Self-pay | Admitting: Cardiovascular Disease

## 2017-08-04 MED FILL — Heparin Sod (Porcine)-NaCl IV Soln 1000 Unit/500ML-0.9%: INTRAVENOUS | Qty: 1000 | Status: AC

## 2017-08-16 ENCOUNTER — Encounter: Payer: Self-pay | Admitting: Cardiology

## 2017-08-16 ENCOUNTER — Ambulatory Visit: Payer: Medicare Other | Admitting: Cardiology

## 2017-08-16 VITALS — BP 160/83 | HR 58 | Ht 60.0 in | Wt 134.6 lb

## 2017-08-16 DIAGNOSIS — I2510 Atherosclerotic heart disease of native coronary artery without angina pectoris: Secondary | ICD-10-CM

## 2017-08-16 DIAGNOSIS — K2100 Gastro-esophageal reflux disease with esophagitis, without bleeding: Secondary | ICD-10-CM

## 2017-08-16 DIAGNOSIS — I10 Essential (primary) hypertension: Secondary | ICD-10-CM

## 2017-08-16 DIAGNOSIS — Z9861 Coronary angioplasty status: Secondary | ICD-10-CM

## 2017-08-16 DIAGNOSIS — E782 Mixed hyperlipidemia: Secondary | ICD-10-CM | POA: Diagnosis not present

## 2017-08-16 DIAGNOSIS — I5189 Other ill-defined heart diseases: Secondary | ICD-10-CM | POA: Diagnosis not present

## 2017-08-16 NOTE — Assessment & Plan Note (Signed)
On high dose statin Rx 

## 2017-08-16 NOTE — Assessment & Plan Note (Signed)
Gastritis by EGD in 2018

## 2017-08-16 NOTE — Progress Notes (Signed)
08/16/2017 Cindy Brandt   [birth year]  [id number]  Primary Physician [name], NP Primary Cardiologist: Dr Gwenlyn Found  HPI:  Pleasant 84 y/o AA female with a h/o CAD, s/p RCA PCI in 2005, Cath in April 2018 revealed an occluded RCA with left right collaterals, occluded nondominant circumflex, high-grade ostial ramus branch stenosis and 50% proximal LAD stenosis. She had normal LV function with grade 2 diastolic dysfunction. Medical therapy was recommended. She was seen in the office in May and the pt's daughter reported the patient was using a lot of SL NTG. We were concerned that she had developed progression in her LAD and she was admitted for cath 08/02/17. This revealed the RI to be occluded but no change in the 50% LAD which supplied collaterals to the RCA. The plan is for continued medical Rx.  She is in the office today for follow up. She has done well, no NTG use since her LOV.    Current Outpatient Medications  Medication Sig Dispense Refill  . alendronate (FOSAMAX) 70 MG tablet Take 1 tablet by mouth once weekly. Do not eat or lay down for 30 minutes after taking. 12 tablet 2  . amLODipine (NORVASC) 5 MG tablet Take 1 tablet (5 mg total) by mouth every evening. 90 tablet 3  . aspirin EC 81 MG tablet Take 81 mg by mouth every morning.    Marland Kitchen atenolol (TENORMIN) 100 MG tablet TAKE 100 MG (1 TAB) BY MOUTH IN THE MORNING AND 50 MG (1/2 TAB) IN THE EVENING. 135 tablet 3  . atorvastatin (LIPITOR) 80 MG tablet Take 1 tablet (80 mg total) by mouth daily at 6 PM. 90 tablet 3  . busPIRone (BUSPAR) 7.5 MG tablet Take 1 tablet (7.5 mg total) by mouth 2 (two) times daily. 180 tablet 3  . calcium carbonate (CALCIUM 600) 600 MG TABS tablet Take 1,200 mg by mouth daily.    . cetirizine (ZYRTEC) 10 MG tablet Take 10 mg by mouth daily.    . clopidogrel (PLAVIX) 75 MG tablet TAKE 1 TABLET BY MOUTH EVERY DAY 30 tablet 5  . isosorbide mononitrate (IMDUR) 60 MG 24 hr tablet Take 1.5 tablets (90 mg  total) by mouth daily. 180 tablet 1  . Melatonin 5 MG TABS Take 5 mg by mouth daily as needed (sleep).    . nitroGLYCERIN (NITROSTAT) 0.4 MG SL tablet Place 1 tablet (0.4 mg total) under the tongue every 5 (five) minutes as needed for chest pain. Chest Pain 25 tablet 11  . oxybutynin (DITROPAN-XL) 5 MG 24 hr tablet Take 1 tablet (5 mg total) by mouth at bedtime. 90 tablet 3  . pantoprazole (PROTONIX) 40 MG tablet Take 1 tablet (40 mg total) by mouth daily. 90 tablet 3  . Probiotic Product (PROBIOTIC PO) Take 1 capsule by mouth daily.     . ranitidine (ZANTAC) 150 MG tablet Take 150 mg by mouth daily.     . sertraline (ZOLOFT) 100 MG tablet TAKE 1 TABLET BY MOUTH EVERY DAY IN THE MORNING 90 tablet 1  . spironolactone (ALDACTONE) 25 MG tablet TAKE 1/2 TABLET BY MOUTH DAILY. 30 tablet 2  . traMADol (ULTRAM) 50 MG tablet Take 1 tablet (50 mg total) by mouth every 8 (eight) hours as needed for severe pain. 15 tablet 0   No current facility-administered medications for this visit.     Allergies  Allergen Reactions  . Tylenol [Acetaminophen] Other (See Comments)    Funny feeling in chest.  Past Medical History:  Diagnosis Date  . Allergy   . Anemia   . Anxiety   . Arthritis   . Blood transfusion without reported diagnosis   . Cataract    a. bilerteral cataracts removed  . Coronary atherosclerosis of native coronary artery    a. 2003 Cath/unsuccessful PCI of occluded RCA;  b. 2005 NSTEMI/Cath: CTO RCA w/ L->R collats, LAD 20, LCX 19m, nl EF;  c. 11/2011 Myoview: no ischemia.  . Diastolic dysfunction 15/40/0867   a. 11/2011 Echo: Ejection fraction 60-65%, gr1 DD, basal inferior AK;  b. 09/2013 Echo: EF 55-60%, mild LVH, no rwma, Gr1 DD, mildly dil LA.  . Gastritis    a. 04/2016 EGD: Nl esophagus, gastritis, nl duodenal bulb & 2nd portion of the duodenum.  Marland Kitchen Headache(784.0)   . Hyperlipidemia   . Hypertension   . Hypokalemia   . Tubular adenoma of colon 2013    Social History    Socioeconomic History  . Marital status: Widowed    Spouse name: Not on file  . Number of children: 5  . Years of education: Not on file  . Highest education level: Not on file  Occupational History  . Occupation: Retired  Scientific laboratory technician  . Financial resource strain: Not on file  . Food insecurity:    Worry: Not on file    Inability: Not on file  . Transportation needs:    Medical: Not on file    Non-medical: Not on file  Tobacco Use  . Smoking status: Former Smoker    Packs/day: 0.50    Years: 25.00    Pack years: 12.50    Types: Cigarettes    Last attempt to quit: 04/06/1996    Years since quitting: 21.3  . Smokeless tobacco: Never Used  Substance and Sexual Activity  . Alcohol use: No  . Drug use: No  . Sexual activity: Not on file  Lifestyle  . Physical activity:    Days per week: Not on file    Minutes per session: Not on file  . Stress: Not on file  Relationships  . Social connections:    Talks on phone: Not on file    Gets together: Not on file    Attends religious service: Not on file    Active member of club or organization: Not on file    Attends meetings of clubs or organizations: Not on file    Relationship status: Not on file  . Intimate partner violence:    Fear of current or ex partner: Not on file    Emotionally abused: Not on file    Physically abused: Not on file    Forced sexual activity: Not on file  Other Topics Concern  . Not on file  Social History Narrative   Currently living with dtr in [place].  No regular exercise.     Family History  Problem Relation Age of Onset  . Coronary artery disease Father   . Heart attack Father   . Colon cancer Father   . Coronary artery disease Mother   . Stomach cancer Mother   . Heart disease Brother   . Diabetes Brother   . Pancreatic cancer Neg Hx   . Rectal cancer Neg Hx   . Esophageal cancer Neg Hx      Review of Systems: General: negative for chills, fever, night sweats or weight changes.   Cardiovascular: negative for chest pain, dyspnea on exertion, edema, orthopnea, palpitations, paroxysmal nocturnal dyspnea or shortness  of breath Dermatological: negative for rash Respiratory: negative for cough or wheezing Urologic: negative for hematuria Abdominal: negative for nausea, vomiting, diarrhea, bright red blood per rectum, melena, or hematemesis Neurologic: negative for visual changes, syncope, or dizziness All other systems reviewed and are otherwise negative except as noted above.    Blood pressure (!) 160/83, pulse (!) 58, height 5' (1.524 m), weight 134 lb 9.6 oz (61.1 kg).  General appearance: alert, cooperative and no distress Neck: no carotid bruit and no JVD Lungs: clear to auscultation bilaterally Heart: regular rate and rhythm Extremities: extremities normal, atraumatic, no cyanosis or edema Skin: Skin color, texture, turgor normal. No rashes or lesions Neurologic: Grossly normal   ASSESSMENT AND PLAN:   CAD (coronary atherosclerotic disease) RCA PCI in 2005 Cath April 2018- occluded RCA, occluded non dominant CFX, high grade RI, 50% LAD with L-R collaterals Normal LVF.  Re look cath 08/02/17- no change except CFX now occluded, LAD unchanged- continue medical Rx  Essential hypertension Controlled  Hyperlipidemia On high dose statin Rx  GERD Gastritis by endo 2018    PLAN  Discussed with the patient and her daughter. I explained that our goal was to keep her chest pain free and that it would require medical Rx as well as an adjustment in her expectations of activity. I suggested she not do mopping or sweeping. The pt admitted that changing linens caused chest pain. I encouraged to be up and about, walking in the evenings. She should let us know if she develops chest pain again, I explained that we still have options with medications.   Kerin Ransom PA-C 08/16/2017 12:25 PM

## 2017-08-16 NOTE — Patient Instructions (Signed)
Medication Instructions:  Continue current medications  If you need a refill on your cardiac medications before your next appointment, please call your pharmacy.  Labwork: None Ordered   Testing/Procedures: None Ordered  Follow-Up: Your physician wants you to follow-up in: Keep appt with Dr Gwenlyn Found in July.      Thank you for choosing [facility]!!

## 2017-08-16 NOTE — Assessment & Plan Note (Signed)
Grade 2 DD 

## 2017-08-16 NOTE — Assessment & Plan Note (Signed)
Controlled.  

## 2017-09-06 ENCOUNTER — Other Ambulatory Visit: Payer: Self-pay | Admitting: Primary Care

## 2017-09-06 DIAGNOSIS — F411 Generalized anxiety disorder: Secondary | ICD-10-CM

## 2017-09-06 NOTE — Telephone Encounter (Signed)
Electronically refill request   Last prescribed on 12/11/2015  Last office visit on 05/28/2017 for acute appt

## 2017-09-07 NOTE — Telephone Encounter (Signed)
Refill sent to pharmacy. Patient needs follow up visit in September 2019, please schedule.

## 2017-09-16 ENCOUNTER — Other Ambulatory Visit: Payer: Self-pay | Admitting: Primary Care

## 2017-09-16 DIAGNOSIS — R32 Unspecified urinary incontinence: Secondary | ICD-10-CM

## 2017-09-16 NOTE — Telephone Encounter (Signed)
Electronically refill request for oxybutynin (DITROPAN-XL) 5 MG 24 hr tablet  Last prescribed on 11/06/2016  Last office visit on 05/28/2017

## 2017-09-17 NOTE — Telephone Encounter (Signed)
Refill sent to pharmacy. She has a follow up visit scheduled for September 2019.

## 2017-09-25 ENCOUNTER — Other Ambulatory Visit: Payer: Self-pay | Admitting: Primary Care

## 2017-09-27 NOTE — Telephone Encounter (Signed)
Per Allie Bossier, this Rx need to go to cardiologist.

## 2017-09-28 ENCOUNTER — Other Ambulatory Visit: Payer: Self-pay | Admitting: Primary Care

## 2017-09-28 DIAGNOSIS — I10 Essential (primary) hypertension: Secondary | ICD-10-CM

## 2017-09-28 NOTE — Telephone Encounter (Signed)
Last prescribed on 10/12/2016  Last office visit on 05/28/2017

## 2017-09-28 NOTE — Telephone Encounter (Signed)
Patient is due for general follow up visit in September. Will you please schedule her at the end of a morning or afternoon session if possible? Refill declined as she is now taking Amlodipine 5 mg.

## 2017-09-29 ENCOUNTER — Ambulatory Visit: Payer: Medicare Other | Admitting: Cardiovascular Disease

## 2017-09-29 NOTE — Telephone Encounter (Signed)
appt has been scheduled.

## 2017-10-07 ENCOUNTER — Telehealth: Payer: Self-pay | Admitting: Cardiovascular Disease

## 2017-10-07 MED ORDER — SPIRONOLACTONE 25 MG PO TABS: 12.5000 mg | ORAL_TABLET | Freq: Every day | ORAL | 6 refills | Status: DC

## 2017-10-07 NOTE — Telephone Encounter (Signed)
New Message     *STAT* If patient is at the pharmacy, call can be transferred to refill team.   1. Which medications need to be refilled? (please list name of each medication and dose if known) spironolactone (ALDACTONE) 25 MG tablet  2. Which pharmacy/location (including street and city if local pharmacy) is medication to be sent to? [id number] - [place], [place] - [address]  3. Do they need a 30 day or 90 day supply? 30     Patient states that the PCP will no longer fill the spironolactone that the cardiologist needs to fill it.

## 2017-10-30 ENCOUNTER — Other Ambulatory Visit: Payer: Self-pay | Admitting: Cardiovascular Disease

## 2017-11-02 NOTE — Telephone Encounter (Signed)
Rx sent to pharmacy   

## 2017-11-15 ENCOUNTER — Ambulatory Visit (INDEPENDENT_AMBULATORY_CARE_PROVIDER_SITE_OTHER): Payer: Medicare Other | Admitting: Primary Care

## 2017-11-15 ENCOUNTER — Other Ambulatory Visit: Payer: Self-pay | Admitting: Primary Care

## 2017-11-15 ENCOUNTER — Encounter: Payer: Self-pay | Admitting: Primary Care

## 2017-11-15 VITALS — BP 136/80 | HR 65 | Temp 98.2°F | Ht 60.0 in | Wt 137.2 lb

## 2017-11-15 DIAGNOSIS — I10 Essential (primary) hypertension: Secondary | ICD-10-CM | POA: Diagnosis not present

## 2017-11-15 DIAGNOSIS — F419 Anxiety disorder, unspecified: Secondary | ICD-10-CM

## 2017-11-15 DIAGNOSIS — R7303 Prediabetes: Secondary | ICD-10-CM

## 2017-11-15 DIAGNOSIS — F329 Major depressive disorder, single episode, unspecified: Secondary | ICD-10-CM

## 2017-11-15 DIAGNOSIS — I200 Unstable angina: Secondary | ICD-10-CM

## 2017-11-15 DIAGNOSIS — R32 Unspecified urinary incontinence: Secondary | ICD-10-CM

## 2017-11-15 DIAGNOSIS — F411 Generalized anxiety disorder: Secondary | ICD-10-CM

## 2017-11-15 DIAGNOSIS — I2510 Atherosclerotic heart disease of native coronary artery without angina pectoris: Secondary | ICD-10-CM

## 2017-11-15 DIAGNOSIS — K219 Gastro-esophageal reflux disease without esophagitis: Secondary | ICD-10-CM

## 2017-11-15 DIAGNOSIS — Z23 Encounter for immunization: Secondary | ICD-10-CM | POA: Diagnosis not present

## 2017-11-15 DIAGNOSIS — E785 Hyperlipidemia, unspecified: Secondary | ICD-10-CM | POA: Diagnosis not present

## 2017-11-15 DIAGNOSIS — E2839 Other primary ovarian failure: Secondary | ICD-10-CM | POA: Diagnosis not present

## 2017-11-15 DIAGNOSIS — Z9861 Coronary angioplasty status: Secondary | ICD-10-CM

## 2017-11-15 DIAGNOSIS — M810 Age-related osteoporosis without current pathological fracture: Secondary | ICD-10-CM

## 2017-11-15 LAB — LIPID PANEL
CHOL/HDL RATIO: 2
Cholesterol: 109 mg/dL (ref 0–200)
HDL: 45.8 mg/dL (ref >39.00)
LDL CALC: 51 mg/dL (ref 0–99)
NONHDL: 63.33
Triglycerides: 64 mg/dL (ref 0.0–149.0)
VLDL: 12.8 mg/dL (ref 0.0–40.0)

## 2017-11-15 LAB — COMPREHENSIVE METABOLIC PANEL
ALT: 17 U/L (ref 0–35)
AST: 19 U/L (ref 0–37)
Albumin: 4 g/dL (ref 3.5–5.2)
Alkaline Phosphatase: 41 U/L (ref 39–117)
BUN: 22 mg/dL (ref 6–23)
CO2: 30 meq/L (ref 19–32)
Calcium: 9.7 mg/dL (ref 8.4–10.5)
Chloride: 104 mEq/L (ref 96–112)
Creatinine, Ser: 0.98 mg/dL (ref 0.40–1.20)
GFR: 69.5 mL/min (ref >60.00)
Glucose, Bld: 120 mg/dL — ABNORMAL HIGH (ref 70–99)
POTASSIUM: 4.1 meq/L (ref 3.5–5.1)
Sodium: 137 mEq/L (ref 135–145)
Total Bilirubin: 0.6 mg/dL (ref 0.2–1.2)
Total Protein: 7 g/dL (ref 6.0–8.3)

## 2017-11-15 LAB — HEMOGLOBIN A1C: Hgb A1c MFr Bld: 6.2 % (ref 4.6–6.5)

## 2017-11-15 MED ORDER — ATORVASTATIN CALCIUM 80 MG PO TABS: ORAL_TABLET | ORAL | 3 refills | Status: DC

## 2017-11-15 MED ORDER — ALENDRONATE SODIUM 70 MG PO TABS
ORAL_TABLET | ORAL | 2 refills | Status: DC
Start: 2017-11-15 — End: 2018-03-04

## 2017-11-15 MED ORDER — OXYBUTYNIN CHLORIDE ER 5 MG PO TB24: ORAL_TABLET | ORAL | 3 refills | Status: DC

## 2017-11-15 MED ORDER — PANTOPRAZOLE SODIUM 40 MG PO TBEC: DELAYED_RELEASE_TABLET | ORAL | 3 refills | Status: DC

## 2017-11-15 MED ORDER — BUSPIRONE HCL 7.5 MG PO TABS: ORAL_TABLET | ORAL | 3 refills | Status: DC

## 2017-11-15 NOTE — Assessment & Plan Note (Signed)
Doing well on both pantoprazole and ranitidine, continue same.

## 2017-11-15 NOTE — Assessment & Plan Note (Signed)
Repeat lipids pending. Continue high intensity statin.

## 2017-11-15 NOTE — Assessment & Plan Note (Signed)
Stable in the office today, continue current regimen. 

## 2017-11-15 NOTE — Addendum Note (Signed)
Addended by: Jacqualin Combes on: 11/15/2017 12:52 PM   Modules accepted: Orders

## 2017-11-15 NOTE — Patient Instructions (Signed)
Stop by the lab prior to leaving today. I will notify you of your results once received.   I sent refills of your medications to your pharmacy.  It was a pleasure to see you today!

## 2017-11-15 NOTE — Assessment & Plan Note (Signed)
Repeat A1C now down in the prediabetic range. Continue to monitor. No meds needed at this time.

## 2017-11-15 NOTE — Assessment & Plan Note (Signed)
Overall sounds stable. Myrbetric was too expensive. Discussed the possibility of Urology evaluation, she declines for now as symptoms are manageable. Continue oxybutynin.

## 2017-11-15 NOTE — Assessment & Plan Note (Signed)
Recent cardiac catheterization grossly unchanged from prior except for changes noted on overview. Plan is for medical rather than surgical management. Continue current regimen.

## 2017-11-15 NOTE — Progress Notes (Signed)
Subjective:    Patient ID: Cindy Brandt, female    DOB: 1931/06/12, 84 y.o.   MRN: [id number]  HPI  [name] is an 84 year old female who presents today for follow up.  1) CAD/NSTEMI/Unstable angina: Currently following with cardiology. Underwent cardiac catheterization in June 2019 which was grossly the same as her prior catheterization, the plan now is for medical management rather than surgical intervention. She is compliant to her regimen and denies chest pain. No recent nitroglycerin use.   2) Osteoporosis: Currently managed on alendronate 70 mg weekly. Her last bone density scan was in February 2019 which showed osteoporosis. She's been on alendronate for around 4 years, believes less than 5. She is compliant weekly.   3) Anxiety and Depression: Currently managed on buspirone 7.5 mg BID and sertraline 100 mg. Overall feels well managed on current regimen. Denies SI/HI.   4) GERD: Currently managed on panoprazole 40 mg and ranitidine 150 mg. Overall her symptoms are stable.   5) Urinary Incontinence: Currently managed on oxybutynin XL 5 mg daily. She urinates every 1-2 hours, does wear a pad. She does experience urinary incontinence occasionally. She was prescribed Myrbetric in the past which was too costly.  Review of Systems  Respiratory: Negative for shortness of breath.   Cardiovascular: Negative for chest pain.  Gastrointestinal:       Denies GERD symptoms  Genitourinary:       Occasional urinary incontinence  Neurological: Negative for dizziness and headaches.  Psychiatric/Behavioral:       Feels well managed on current regimen. Denies SI/HI       Past Medical History:  Diagnosis Date  . Allergy   . Anemia   . Anxiety   . Arthritis   . Blood transfusion without reported diagnosis   . Cataract    a. bilerteral cataracts removed  . Coronary atherosclerosis of native coronary artery    a. 2003 Cath/unsuccessful PCI of occluded RCA;  b. 2005 NSTEMI/Cath: CTO RCA w/  L->R collats, LAD 20, LCX 18m, nl EF;  c. 11/2011 Myoview: no ischemia.  . Diastolic dysfunction 50/53/9767   a. 11/2011 Echo: Ejection fraction 60-65%, gr1 DD, basal inferior AK;  b. 09/2013 Echo: EF 55-60%, mild LVH, no rwma, Gr1 DD, mildly dil LA.  . Gastritis    a. 04/2016 EGD: Nl esophagus, gastritis, nl duodenal bulb & 2nd portion of the duodenum.  Marland Kitchen Headache(784.0)   . Hyperlipidemia   . Hypertension   . Hypokalemia   . Tubular adenoma of colon 2013     Social History   Socioeconomic History  . Marital status: Widowed    Spouse name: Not on file  . Number of children: 5  . Years of education: Not on file  . Highest education level: Not on file  Occupational History  . Occupation: Retired  Scientific laboratory technician  . Financial resource strain: Not on file  . Food insecurity:    Worry: Not on file    Inability: Not on file  . Transportation needs:    Medical: Not on file    Non-medical: Not on file  Tobacco Use  . Smoking status: Former Smoker    Packs/day: 0.50    Years: 25.00    Pack years: 12.50    Types: Cigarettes    Last attempt to quit: 04/06/1996    Years since quitting: 21.6  . Smokeless tobacco: Never Used  Substance and Sexual Activity  . Alcohol use: No  . Drug use:  No  . Sexual activity: Not on file  Lifestyle  . Physical activity:    Days per week: Not on file    Minutes per session: Not on file  . Stress: Not on file  Relationships  . Social connections:    Talks on phone: Not on file    Gets together: Not on file    Attends religious service: Not on file    Active member of club or organization: Not on file    Attends meetings of clubs or organizations: Not on file    Relationship status: Not on file  . Intimate partner violence:    Fear of current or ex partner: Not on file    Emotionally abused: Not on file    Physically abused: Not on file    Forced sexual activity: Not on file  Other Topics Concern  . Not on file  Social History Narrative    Currently living with dtr in [place].  No regular exercise.    Past Surgical History:  Procedure Laterality Date  . CARPAL TUNNEL RELEASE Bilateral   . CHOLECYSTECTOMY  2004  . COLONOSCOPY  08/06/2011   Procedure: COLONOSCOPY;  Surgeon: [name], MD;  Location: [facility];  Service: Endoscopy;  Laterality: N/A;  10:40 AM  . CORONARY ANGIOGRAPHY N/A 08/02/2017   Procedure: CORONARY ANGIOGRAPHY;  Surgeon: Lorretta Harp, MD;  Location: [facility];  Service: Cardiovascular;  Laterality: N/A;  . GALLBLADDER SURGERY    . LEFT HEART CATH AND CORONARY ANGIOGRAPHY N/A 06/15/2016   Procedure: Left Heart Cath and Coronary Angiography;  Surgeon: Lorretta Harp, MD;  Location: [facility];  Service: Cardiovascular;  Laterality: N/A;  . PARTIAL HYSTERECTOMY    . repair of belly button    . UMBILICAL HERNIA REPAIR     Umbilical hernia repair as a child    Family History  Problem Relation Age of Onset  . Coronary artery disease Father   . Heart attack Father   . Colon cancer Father   . Coronary artery disease Mother   . Stomach cancer Mother   . Heart disease Brother   . Diabetes Brother   . Pancreatic cancer Neg Hx   . Rectal cancer Neg Hx   . Esophageal cancer Neg Hx     Allergies  Allergen Reactions  . Tylenol [Acetaminophen] Other (See Comments)    Funny feeling in chest.     Current Outpatient Medications on File Prior to Visit  Medication Sig Dispense Refill  . amLODipine (NORVASC) 5 MG tablet Take 1 tablet (5 mg total) by mouth every evening. 90 tablet 3  . aspirin EC 81 MG tablet Take 81 mg by mouth every morning.    Marland Kitchen atenolol (TENORMIN) 100 MG tablet TAKE 100 MG (1 TAB) BY MOUTH IN THE MORNING AND 50 MG (1/2 TAB) IN THE EVENING. 135 tablet 3  . calcium carbonate (CALCIUM 600) 600 MG TABS tablet Take 1,200 mg by mouth daily.    . cetirizine (ZYRTEC) 10 MG tablet Take 10 mg by mouth daily.    . clopidogrel (PLAVIX) 75 MG tablet Take 1 tablet (75 mg total)  by mouth daily. Please make appointment for further refills 90 tablet 0  . isosorbide mononitrate (IMDUR) 60 MG 24 hr tablet Take 1.5 tablets (90 mg total) by mouth daily. 180 tablet 1  . Melatonin 5 MG TABS Take 5 mg by mouth daily as needed (sleep).    . nitroGLYCERIN (  NITROSTAT) 0.4 MG SL tablet Place 1 tablet (0.4 mg total) under the tongue every 5 (five) minutes as needed for chest pain. Chest Pain 25 tablet 11  . Probiotic Product (PROBIOTIC PO) Take 1 capsule by mouth daily.     . ranitidine (ZANTAC) 150 MG tablet Take 150 mg by mouth daily.     . sertraline (ZOLOFT) 100 MG tablet Take 1 tablet by mouth once daily for depression/anxiety. 90 tablet 0  . spironolactone (ALDACTONE) 25 MG tablet Take 0.5 tablets (12.5 mg total) by mouth daily. 15 tablet 6   No current facility-administered medications on file prior to visit.     BP 136/80   Pulse 65   Temp 98.2 F (36.8 C) (Oral)   Ht 5' (1.524 m)   Wt 137 lb 4 oz (62.3 kg)   SpO2 95%   BMI 26.80 kg/m    Objective:   Physical Exam  Constitutional: She is oriented to person, place, and time. She appears well-nourished.  Neck: Neck supple.  Cardiovascular: Normal rate and regular rhythm.  Respiratory: Effort normal and breath sounds normal.  Neurological: She is alert and oriented to person, place, and time.  Skin: Skin is warm and dry.  Psychiatric: She has a normal mood and affect.           Assessment & Plan:

## 2017-11-15 NOTE — Assessment & Plan Note (Signed)
Feels well managed on current regimen, continue same. Denies SI/HI.

## 2017-11-15 NOTE — Assessment & Plan Note (Signed)
Compliant to alendronate for around 4 years. After this year she'll need to take a drug holiday. She is compliant to calcium and vitamin D, continue same.

## 2017-11-15 NOTE — Assessment & Plan Note (Signed)
Denies recent chest pain.

## 2017-11-30 ENCOUNTER — Other Ambulatory Visit: Payer: Self-pay | Admitting: Primary Care

## 2017-11-30 DIAGNOSIS — F411 Generalized anxiety disorder: Secondary | ICD-10-CM

## 2017-12-27 ENCOUNTER — Other Ambulatory Visit: Payer: Self-pay | Admitting: Cardiovascular Disease

## 2017-12-27 NOTE — Telephone Encounter (Signed)
Rx request sent to pharmacy.  

## 2018-01-06 ENCOUNTER — Telehealth: Payer: Self-pay | Admitting: Primary Care

## 2018-01-06 NOTE — Telephone Encounter (Signed)
Patient's daughter is requesting a refill of pantoprazole to be sent in to her pharmacy at [facility] on [address]. She will be going out of town for a few months and wants to make sure she has enough medication. Please advise.

## 2018-01-06 NOTE — Telephone Encounter (Signed)
Spoken to patient's daughter. Patient was prescribed 90 tablets with 3 refills on 11/15/2017. [facility]

## 2018-02-01 ENCOUNTER — Other Ambulatory Visit: Payer: Self-pay | Admitting: Cardiovascular Disease

## 2018-02-01 NOTE — Telephone Encounter (Signed)
Rx sent to pharmacy   

## 2018-03-03 ENCOUNTER — Telehealth: Payer: Self-pay | Admitting: *Deleted

## 2018-03-03 NOTE — Telephone Encounter (Signed)
Spoke to pts daughter, Dewaine Oats who states the pt is out of town until Friday am. She doesn't believe its anything critical, but occasionally has trouble walking. Pt has been out of town for appx a month with her daughter, who noticed pt "wasn't getting around like she used to" and just wanted her to be evaluated.

## 2018-03-03 NOTE — Telephone Encounter (Signed)
noted 

## 2018-03-04 ENCOUNTER — Ambulatory Visit (INDEPENDENT_AMBULATORY_CARE_PROVIDER_SITE_OTHER): Payer: Medicare Other | Admitting: Internal Medicine

## 2018-03-04 ENCOUNTER — Encounter: Payer: Self-pay | Admitting: Internal Medicine

## 2018-03-04 ENCOUNTER — Ambulatory Visit (INDEPENDENT_AMBULATORY_CARE_PROVIDER_SITE_OTHER)
Admission: RE | Admit: 2018-03-04 | Discharge: 2018-03-04 | Disposition: A | Discharge status: Home / Self Care | Payer: Medicare Other | Source: Ambulatory Visit | Attending: Internal Medicine | Admitting: Internal Medicine

## 2018-03-04 VITALS — BP 132/68 | HR 60 | Temp 98.2°F | Wt 134.0 lb

## 2018-03-04 DIAGNOSIS — R296 Repeated falls: Secondary | ICD-10-CM

## 2018-03-04 DIAGNOSIS — R1031 Right lower quadrant pain: Secondary | ICD-10-CM

## 2018-03-04 DIAGNOSIS — R29898 Other symptoms and signs involving the musculoskeletal system: Secondary | ICD-10-CM

## 2018-03-04 DIAGNOSIS — M1611 Unilateral primary osteoarthritis, right hip: Secondary | ICD-10-CM | POA: Diagnosis not present

## 2018-03-04 DIAGNOSIS — M47816 Spondylosis without myelopathy or radiculopathy, lumbar region: Secondary | ICD-10-CM | POA: Diagnosis not present

## 2018-03-04 MED ORDER — PREDNISONE 10 MG PO TABS: ORAL_TABLET | ORAL | 0 refills | Status: DC

## 2018-03-04 NOTE — Progress Notes (Signed)
Subjective:    Patient ID: Cindy Brandt, female    DOB: August 09, 1931, 85 y.o.   MRN: [id number]  HPI  Pt presents to the clinic today with c/o right leg pain and weakness. She reports this started 2 months ago, but has worsened in the last month. She has pain mostly in the right groin which she describes at "grabbing" and sharp. The pain radiates into the leg. She does have some numbness and tingling in the right leg. She denies loss of bowel or bladder. She denies low back pain. She reports the weakness has caused her to have multiple falls but she has not sustained any noted injury. She takes Ibuprofen daily.     Review of Systems      Past Medical History:  Diagnosis Date  . Allergy   . Anemia   . Anxiety   . Arthritis   . Blood transfusion without reported diagnosis   . Cataract    a. bilerteral cataracts removed  . Coronary atherosclerosis of native coronary artery    a. 2003 Cath/unsuccessful PCI of occluded RCA;  b. 2005 NSTEMI/Cath: CTO RCA w/ L->R collats, LAD 20, LCX 30m, nl EF;  c. 11/2011 Myoview: no ischemia.  . Diastolic dysfunction 54/27/0623   a. 11/2011 Echo: Ejection fraction 60-65%, gr1 DD, basal inferior AK;  b. 09/2013 Echo: EF 55-60%, mild LVH, no rwma, Gr1 DD, mildly dil LA.  . Gastritis    a. 04/2016 EGD: Nl esophagus, gastritis, nl duodenal bulb & 2nd portion of the duodenum.  Marland Kitchen Headache(784.0)   . Hyperlipidemia   . Hypertension   . Hypokalemia   . Tubular adenoma of colon 2013    Current Outpatient Medications  Medication Sig Dispense Refill  . alendronate (FOSAMAX) 70 MG tablet Take 1 tablet by mouth once weekly. Do not eat or lay down for 30 minutes after taking. 12 tablet 2  . amLODipine (NORVASC) 5 MG tablet Take 1 tablet (5 mg total) by mouth every evening. 90 tablet 3  . aspirin EC 81 MG tablet Take 81 mg by mouth every morning.    Marland Kitchen atenolol (TENORMIN) 100 MG tablet TAKE 100 MG (1 TAB) BY MOUTH IN THE MORNING AND 50 MG (1/2 TAB) IN THE EVENING. 135  tablet 3  . atorvastatin (LIPITOR) 80 MG tablet Take 1 tablet by mouth every evening for cholesterol. 90 tablet 3  . busPIRone (BUSPAR) 7.5 MG tablet Take 1 tablet by mouth twice daily for anxiety. 180 tablet 3  . calcium carbonate (CALCIUM 600) 600 MG TABS tablet Take 1,200 mg by mouth daily.    . cetirizine (ZYRTEC) 10 MG tablet Take 10 mg by mouth daily.    . clopidogrel (PLAVIX) 75 MG tablet Take 1 tablet (75 mg total) by mouth daily. Please make appointment for further refills 60 tablet 0  . isosorbide mononitrate (IMDUR) 60 MG 24 hr tablet TAKE 1.5 TABLETS (90 MG TOTAL) BY MOUTH DAILY. 120 tablet 2  . Melatonin 5 MG TABS Take 5 mg by mouth daily as needed (sleep).    . nitroGLYCERIN (NITROSTAT) 0.4 MG SL tablet Place 1 tablet (0.4 mg total) under the tongue every 5 (five) minutes as needed for chest pain. Chest Pain 25 tablet 11  . oxybutynin (DITROPAN-XL) 5 MG 24 hr tablet Take 1 tablet by mouth every evening for overactive bladder. 90 tablet 3  . pantoprazole (PROTONIX) 40 MG tablet Take 1 tablet by mouth once daily for heartburn. 90 tablet 3  .  Probiotic Product (PROBIOTIC PO) Take 1 capsule by mouth daily.     . ranitidine (ZANTAC) 150 MG tablet Take 150 mg by mouth daily.     . sertraline (ZOLOFT) 100 MG tablet TAKE 1 TABLET BY MOUTH ONCE DAILY FOR DEPRESSION/ANXIETY. 90 tablet 1  . spironolactone (ALDACTONE) 25 MG tablet Take 0.5 tablets (12.5 mg total) by mouth daily. 15 tablet 6   No current facility-administered medications for this visit.     Allergies  Allergen Reactions  . Tylenol [Acetaminophen] Other (See Comments)    Funny feeling in chest.     Family History  Problem Relation Age of Onset  . Coronary artery disease Father   . Heart attack Father   . Colon cancer Father   . Coronary artery disease Mother   . Stomach cancer Mother   . Heart disease Brother   . Diabetes Brother   . Pancreatic cancer Neg Hx   . Rectal cancer Neg Hx   . Esophageal cancer Neg Hx       Social History   Socioeconomic History  . Marital status: Widowed    Spouse name: Not on file  . Number of children: 5  . Years of education: Not on file  . Highest education level: Not on file  Occupational History  . Occupation: Retired  Scientific laboratory technician  . Financial resource strain: Not on file  . Food insecurity:    Worry: Not on file    Inability: Not on file  . Transportation needs:    Medical: Not on file    Non-medical: Not on file  Tobacco Use  . Smoking status: Former Smoker    Packs/day: 0.50    Years: 25.00    Pack years: 12.50    Types: Cigarettes    Last attempt to quit: 04/06/1996    Years since quitting: 21.9  . Smokeless tobacco: Never Used  Substance and Sexual Activity  . Alcohol use: No  . Drug use: No  . Sexual activity: Not on file  Lifestyle  . Physical activity:    Days per week: Not on file    Minutes per session: Not on file  . Stress: Not on file  Relationships  . Social connections:    Talks on phone: Not on file    Gets together: Not on file    Attends religious service: Not on file    Active member of club or organization: Not on file    Attends meetings of clubs or organizations: Not on file    Relationship status: Not on file  . Intimate partner violence:    Fear of current or ex partner: Not on file    Emotionally abused: Not on file    Physically abused: Not on file    Forced sexual activity: Not on file  Other Topics Concern  . Not on file  Social History Narrative   Currently living with dtr in [place].  No regular exercise.     Constitutional: Denies fever, malaise, fatigue, headache or abrupt weight changes.  Musculoskeletal: Pt reports right groin pain, right leg pain and weakness. Denies decrease in range of motion, muscle pain or joint swelling.  Skin: Denies redness, rashes, lesions or ulcercations.  Neurological: Pt reports numbness, tingling in right leg. Denies dizziness, difficulty with memory, difficulty with  speech.    No other specific complaints in a complete review of systems (except as listed in HPI above).  Objective:   Physical Exam  BP 132/68  Pulse 60   Temp 98.2 F (36.8 C) (Oral)   Wt 134 lb (60.8 kg)   SpO2 96%   BMI 26.17 kg/m  Wt Readings from Last 3 Encounters:  03/04/18 134 lb (60.8 kg)  11/15/17 137 lb 4 oz (62.3 kg)  08/16/17 134 lb 9.6 oz (61.1 kg)    General: Appears her stated age, well developed, well nourished in NAD. Musculoskeletal: Pain with flexion and extension of the spine. Normal rotation. No bony tenderness noted over the spine. Pain with palpation of the right SI Joint. Pain with abduction of the right hip. Normal adduction. Pain with flexion of the right hip but not with extension. Pain with internal and external rotation of the right hip. No pain with palpation over the trochanteric bursa. Pain with palpation over the right anterior hip. Strength 4/5 BLE. Limps with normal gait. Can walk on heels and toes.  Neurological: Alert and oriented. Positive SLR on the right. Sensation intact to BLE.   BMET    Component Value Date/Time   NA 137 11/15/2017 1108   K 4.1 11/15/2017 1108   CL 104 11/15/2017 1108   CO2 30 11/15/2017 1108   GLUCOSE 120 (H) 11/15/2017 1108   BUN 22 11/15/2017 1108   CREATININE 0.98 11/15/2017 1108   CALCIUM 9.7 11/15/2017 1108   GFRNONAA >60 08/02/2017 1102   GFRAA >60 08/02/2017 1102    Lipid Panel     Component Value Date/Time   CHOL 109 11/15/2017 1108   TRIG 64.0 11/15/2017 1108   HDL 45.80 11/15/2017 1108   CHOLHDL 2 11/15/2017 1108   VLDL 12.8 11/15/2017 1108   LDLCALC 51 11/15/2017 1108    CBC    Component Value Date/Time   WBC 4.3 08/02/2017 1102   RBC 3.37 (L) 08/02/2017 1102   HGB 11.1 (L) 08/02/2017 1102   HCT 33.5 (L) 08/02/2017 1102   PLT 187 08/02/2017 1102   MCV 99.4 08/02/2017 1102   MCH 32.9 08/02/2017 1102   MCHC 33.1 08/02/2017 1102   RDW 12.1 08/02/2017 1102   LYMPHSABS 1.7 06/26/2013  1415   MONOABS 0.5 06/26/2013 1415   EOSABS 0.0 06/26/2013 1415   BASOSABS 0.0 06/26/2013 1415    Hgb A1C Lab Results  Component Value Date   HGBA1C 6.2 11/15/2017          Assessment & Plan:   Right Groin Pain, Right Leg Weakness, Paresthesia RLE, Frequent Falls:  DDX include OA of right hip and lumbar spine, tendonitis right hip, low back pain with right side sciatica eRx for Pred Taper x 9 days Avoid taking Ibuprofen with Prednisone or Plavix Xray right hip and lumbar spine for further evaluation today Ice may be helpful Discussed some stretching exercises May benefit from PT for stretching, strengthening and gait training RX for rolling walker given  Return precautions discussed, will follow up after xrays Webb Silversmith, NP

## 2018-03-04 NOTE — Patient Instructions (Signed)
Fall Prevention in the Home, Adult  Falls can cause injuries. They can happen to people of all ages. There are many things you can do to make your home safe and to help prevent falls. Ask for help when making these changes, if needed.  What actions can I take to prevent falls?  General Instructions  · Use good lighting in all rooms. Replace any light bulbs that burn out.  · Turn on the lights when you go into a dark area. Use night-lights.  · Keep items that you use often in easy-to-reach places. Lower the shelves around your home if necessary.  · Set up your furniture so you have a clear path. Avoid moving your furniture around.  · Do not have throw rugs and other things on the floor that can make you trip.  · Avoid walking on wet floors.  · If any of your floors are uneven, fix them.  · Add color or contrast paint or tape to clearly mark and help you see:  ? Any grab bars or handrails.  ? First and last steps of stairways.  ? Where the edge of each step is.  · If you use a stepladder:  ? Make sure that it is fully opened. Do not climb a closed stepladder.  ? Make sure that both sides of the stepladder are locked into place.  ? Ask someone to hold the stepladder for you while you use it.  · If there are any pets around you, be aware of where they are.  What can I do in the bathroom?         · Keep the floor dry. Clean up any water that spills onto the floor as soon as it happens.  · Remove soap buildup in the tub or shower regularly.  · Use non-skid mats or decals on the floor of the tub or shower.  · Attach bath mats securely with double-sided, non-slip rug tape.  · If you need to sit down in the shower, use a plastic, non-slip stool.  · Install grab bars by the toilet and in the tub and shower. Do not use towel bars as grab bars.  What can I do in the bedroom?  · Make sure that you have a light by your bed that is easy to reach.  · Do not use any sheets or blankets that are too big for your bed. They should  not hang down onto the floor.  · Have a firm chair that has side arms. You can use this for support while you get dressed.  What can I do in the kitchen?  · Clean up any spills right away.  · If you need to reach something above you, use a strong step stool that has a grab bar.  · Keep electrical cords out of the way.  · Do not use floor polish or wax that makes floors slippery. If you must use wax, use non-skid floor wax.  What can I do with my stairs?  · Do not leave any items on the stairs.  · Make sure that you have a light switch at the top of the stairs and the bottom of the stairs. If you do not have them, ask someone to add them for you.  · Make sure that there are handrails on both sides of the stairs, and use them. Fix handrails that are broken or loose. Make sure that handrails are as long as the stairways.  ·   Install non-slip stair treads on all stairs in your home.  · Avoid having throw rugs at the top or bottom of the stairs. If you do have throw rugs, attach them to the floor with carpet tape.  · Choose a carpet that does not hide the edge of the steps on the stairway.  · Check any carpeting to make sure that it is firmly attached to the stairs. Fix any carpet that is loose or worn.  What can I do on the outside of my home?  · Use bright outdoor lighting.  · Regularly fix the edges of walkways and driveways and fix any cracks.  · Remove anything that might make you trip as you walk through a door, such as a raised step or threshold.  · Trim any bushes or trees on the path to your home.  · Regularly check to see if handrails are loose or broken. Make sure that both sides of any steps have handrails.  · Install guardrails along the edges of any raised decks and porches.  · Clear walking paths of anything that might make someone trip, such as tools or rocks.  · Have any leaves, snow, or ice cleared regularly.  · Use sand or salt on walking paths during winter.  · Clean up any spills in your garage right  away. This includes grease or oil spills.  What other actions can I take?  · Wear shoes that:  ? Have a low heel. Do not wear high heels.  ? Have rubber bottoms.  ? Are comfortable and fit you well.  ? Are closed at the toe. Do not wear open-toe sandals.  · Use tools that help you move around (mobility aids) if they are needed. These include:  ? Canes.  ? Walkers.  ? Scooters.  ? Crutches.  · Review your medicines with your doctor. Some medicines can make you feel dizzy. This can increase your chance of falling.  Ask your doctor what other things you can do to help prevent falls.  Where to find more information  · Centers for Disease Control and Prevention, [employer]: https://cdc.gov  · National Institute on Aging: https://go4life.nia.nih.gov  Contact a doctor if:  · You are afraid of falling at home.  · You feel weak, drowsy, or dizzy at home.  · You fall at home.  Summary  · There are many simple things that you can do to make your home safe and to help prevent falls.  · Ways to make your home safe include removing tripping hazards and installing grab bars in the bathroom.  · Ask for help when making these changes in your home.  This information is not intended to replace advice given to you by your health care provider. Make sure you discuss any questions you have with your health care provider.  Document Released: 12/13/2008 Document Revised: 10/01/2016 Document Reviewed: 10/01/2016  Elsevier Interactive Patient Education © 2019 [employer] Inc[employer]

## 2018-03-08 ENCOUNTER — Other Ambulatory Visit: Payer: Self-pay | Admitting: Internal Medicine

## 2018-03-08 DIAGNOSIS — R296 Repeated falls: Secondary | ICD-10-CM

## 2018-03-08 DIAGNOSIS — R1031 Right lower quadrant pain: Secondary | ICD-10-CM

## 2018-03-08 DIAGNOSIS — R29898 Other symptoms and signs involving the musculoskeletal system: Secondary | ICD-10-CM

## 2018-03-11 ENCOUNTER — Telehealth: Payer: Self-pay | Admitting: *Deleted

## 2018-03-11 NOTE — Telephone Encounter (Signed)
Have her try Excedrin Migraine for the headaches, this may help.

## 2018-03-11 NOTE — Telephone Encounter (Signed)
Spoken to patient's daughter and she stated that patient cannot take Tylenol since she is allergic to it (side effect per patient that she gets chest pain).  Patient's stated that she takes ibuprofen everyday for arthritis pain and complains about the arthritis pain. Is there anything patient can take either OTC or if Allie Bossier can prescribed something for the arthritis pain and the headache.

## 2018-03-11 NOTE — Telephone Encounter (Signed)
Please notify patient that she may take either Tylenol or Excedrin Migraine for headaches.  Do not exceed 3000 mg of Tylenol in 24 hours.

## 2018-03-11 NOTE — Telephone Encounter (Signed)
Patient daughter [name]) called stating that she saw Webb Silversmith NP the other day and was put on steroids. [name] stated that Webb Silversmith told them that she should not take Ibuprofen while on the steroids and should never take it because of being on Plavix. [name] stated that patient had been taking ibuprofen almost every day for headaches. [name] stated that patient has a headache and wants to know what Anda Kraft recommends that she take for this since she should never take Ibuprofen? [facility]

## 2018-03-14 NOTE — Telephone Encounter (Signed)
Spoke with daughter and they did get the Excedrin over the weekend, they said so far pt hasn't complain about any issues so far so she will keep [place] updated but she did say that she got pt regular excedrin but if it doesn't help she will get the excedrin migraine strength and if that still doesn't help she will call us back

## 2018-03-17 ENCOUNTER — Telehealth: Payer: Self-pay | Admitting: Internal Medicine

## 2018-03-17 NOTE — Telephone Encounter (Signed)
Spoke with patients daughter Dewaine Oats and the patient is refusing to go to PT. We are closing out this referral.

## 2018-03-17 NOTE — Telephone Encounter (Signed)
noted 

## 2018-03-25 ENCOUNTER — Other Ambulatory Visit: Payer: Self-pay | Admitting: Cardiovascular Disease

## 2018-04-04 ENCOUNTER — Other Ambulatory Visit: Payer: Self-pay | Admitting: Cardiovascular Disease

## 2018-04-04 NOTE — Telephone Encounter (Signed)
Rx request sent to pharmacy.  

## 2018-04-11 ENCOUNTER — Ambulatory Visit (INDEPENDENT_AMBULATORY_CARE_PROVIDER_SITE_OTHER): Payer: Medicare Other | Admitting: Primary Care

## 2018-04-11 ENCOUNTER — Encounter: Payer: Self-pay | Admitting: Primary Care

## 2018-04-11 VITALS — BP 132/70 | HR 65 | Temp 98.2°F | Ht 60.0 in | Wt 132.0 lb

## 2018-04-11 DIAGNOSIS — K649 Unspecified hemorrhoids: Secondary | ICD-10-CM

## 2018-04-11 DIAGNOSIS — R7303 Prediabetes: Secondary | ICD-10-CM

## 2018-04-11 DIAGNOSIS — R1030 Lower abdominal pain, unspecified: Secondary | ICD-10-CM | POA: Insufficient documentation

## 2018-04-11 LAB — POC URINALSYSI DIPSTICK (AUTOMATED)
GLUCOSE UA: NEGATIVE
KETONES UA: NEGATIVE
Nitrite, UA: NEGATIVE
Protein, UA: NEGATIVE
RBC UA: NEGATIVE
SPEC GRAV UA: 1.015 (ref 1.010–1.025)
Urobilinogen, UA: 0.2 E.U./dL
pH, UA: 5.5 (ref 5.0–8.0)

## 2018-04-11 LAB — BASIC METABOLIC PANEL
BUN: 23 mg/dL (ref 6–23)
CO2: 28 mEq/L (ref 19–32)
Calcium: 9.5 mg/dL (ref 8.4–10.5)
Chloride: 103 mEq/L (ref 96–112)
Creatinine, Ser: 1.04 mg/dL (ref 0.40–1.20)
GFR: 61 mL/min (ref >60.00)
Glucose, Bld: 103 mg/dL — ABNORMAL HIGH (ref 70–99)
Potassium: 4.3 mEq/L (ref 3.5–5.1)
Sodium: 137 mEq/L (ref 135–145)

## 2018-04-11 LAB — POC HEMOCCULT BLD/STL (OFFICE/1-CARD/DIAGNOSTIC): FECAL OCCULT BLD: NEGATIVE

## 2018-04-11 LAB — CBC
HCT: 37.2 % (ref 36.0–46.0)
HEMOGLOBIN: 12.6 g/dL (ref 12.0–15.0)
MCHC: 33.9 g/dL (ref 30.0–36.0)
MCV: 99.2 fl (ref 78.0–100.0)
PLATELETS: 273 10*3/uL (ref 150.0–400.0)
RBC: 3.75 Mil/uL — ABNORMAL LOW (ref 3.87–5.11)
RDW: 12.7 % (ref 11.5–15.5)
WBC: 4.1 10*3/uL (ref 4.0–10.5)

## 2018-04-11 LAB — HEMOGLOBIN A1C: Hgb A1c MFr Bld: 6.1 % (ref 4.6–6.5)

## 2018-04-11 MED ORDER — HYDROCORTISONE 2.5 % RE CREA: 1.0000 "application " | TOPICAL_CREAM | Freq: Two times a day (BID) | RECTAL | 0 refills | Status: DC

## 2018-04-11 NOTE — Progress Notes (Signed)
Subjective:    Patient ID: Cindy Brandt, female    DOB: [birth year], 85 y.o.   MRN: [id number]  HPI  Ms. Follmer is an 85 year old female with a history of GERD, IBS, NSTEMI, osteoarthritis, gastritis, osteoporosis, tobacco abuse who presents today with multiple complaints.  Evaluated by Webb Silversmith on 03/04/18 for a chief complaint of right lower extremity pain and weakness that began 2 months prior. She described her symptoms mostly located to the right groin that were "grabbing" and sharp with radiation of pain and numbness to right lower extremity. She denied low back pain. She was treated with a prednisone taper. Xray of the right hip and lumbar spine completed with mild compression deformity of L1, trace anterolisthesis of L3-L4, moderate degenerative changes to right hip. Discussed stretching exercises.   Today she's reporting bilateral buttock pain near the rectum. She will find that she will notice rectal pain/discomfort if she sits in a chair for prolonged periods of time. She will have bowel movements 1-2 times daily, mostly soft. She also reports suprapubic/bilateral groin pain which began 3-4 days ago, has increased.   She endorses some bright red rectal bleeding on the toilet paper three weeks ago after having firm stools. Firm stools have improved with increased intake of applesauce. She describes her pain as sharp pain that is intermittent. She denies vomiting, change in appetite.   Review of Systems  Constitutional: Negative for fever.  Gastrointestinal: Positive for abdominal pain. Negative for blood in stool, constipation, diarrhea and vomiting.  Musculoskeletal: Negative for back pain.  Neurological: Negative for dizziness.       Past Medical History:  Diagnosis Date  . Allergy   . Anemia   . Anxiety   . Arthritis   . Blood transfusion without reported diagnosis   . Cataract    a. bilerteral cataracts removed  . Coronary atherosclerosis of native coronary artery    a. 2003 Cath/unsuccessful PCI of occluded RCA;  b. 2005 NSTEMI/Cath: CTO RCA w/ L->R collats, LAD 20, LCX 67m, nl EF;  c. 11/2011 Myoview: no ischemia.  . Diastolic dysfunction 62/94/7654   a. 11/2011 Echo: Ejection fraction 60-65%, gr1 DD, basal inferior AK;  b. 09/2013 Echo: EF 55-60%, mild LVH, no rwma, Gr1 DD, mildly dil LA.  . Gastritis    a. 04/2016 EGD: Nl esophagus, gastritis, nl duodenal bulb & 2nd portion of the duodenum.  Marland Kitchen Headache(784.0)   . Hyperlipidemia   . Hypertension   . Hypokalemia   . Tubular adenoma of colon 2013     Social History   Socioeconomic History  . Marital status: Widowed    Spouse name: Not on file  . Number of children: 5  . Years of education: Not on file  . Highest education level: Not on file  Occupational History  . Occupation: Retired  Scientific laboratory technician  . Financial resource strain: Not on file  . Food insecurity:    Worry: Not on file    Inability: Not on file  . Transportation needs:    Medical: Not on file    Non-medical: Not on file  Tobacco Use  . Smoking status: Former Smoker    Packs/day: 0.50    Years: 25.00    Pack years: 12.50    Types: Cigarettes    Last attempt to quit: 04/06/1996    Years since quitting: 22.0  . Smokeless tobacco: Never Used  Substance and Sexual Activity  . Alcohol use: No  . Drug use:  No  . Sexual activity: Not on file  Lifestyle  . Physical activity:    Days per week: Not on file    Minutes per session: Not on file  . Stress: Not on file  Relationships  . Social connections:    Talks on phone: Not on file    Gets together: Not on file    Attends religious service: Not on file    Active member of club or organization: Not on file    Attends meetings of clubs or organizations: Not on file    Relationship status: Not on file  . Intimate partner violence:    Fear of current or ex partner: Not on file    Emotionally abused: Not on file    Physically abused: Not on file    Forced sexual activity: Not on  file  Other Topics Concern  . Not on file  Social History Narrative   Currently living with dtr in [place].  No regular exercise.    Past Surgical History:  Procedure Laterality Date  . CARPAL TUNNEL RELEASE Bilateral   . CHOLECYSTECTOMY  2004  . COLONOSCOPY  08/06/2011   Procedure: COLONOSCOPY;  Surgeon: [name], MD;  Location: [facility];  Service: Endoscopy;  Laterality: N/A;  10:40 AM  . CORONARY ANGIOGRAPHY N/A 08/02/2017   Procedure: CORONARY ANGIOGRAPHY;  Surgeon: Lorretta Harp, MD;  Location: [facility];  Service: Cardiovascular;  Laterality: N/A;  . GALLBLADDER SURGERY    . LEFT HEART CATH AND CORONARY ANGIOGRAPHY N/A 06/15/2016   Procedure: Left Heart Cath and Coronary Angiography;  Surgeon: Lorretta Harp, MD;  Location: [facility];  Service: Cardiovascular;  Laterality: N/A;  . PARTIAL HYSTERECTOMY    . repair of belly button    . UMBILICAL HERNIA REPAIR     Umbilical hernia repair as a child    Family History  Problem Relation Age of Onset  . Coronary artery disease Father   . Heart attack Father   . Colon cancer Father   . Coronary artery disease Mother   . Stomach cancer Mother   . Heart disease Brother   . Diabetes Brother   . Pancreatic cancer Neg Hx   . Rectal cancer Neg Hx   . Esophageal cancer Neg Hx     Allergies  Allergen Reactions  . Tylenol [Acetaminophen] Other (See Comments)    Funny feeling in chest.     Current Outpatient Medications on File Prior to Visit  Medication Sig Dispense Refill  . amLODipine (NORVASC) 5 MG tablet Take 1 tablet (5 mg total) by mouth every evening. 90 tablet 3  . aspirin EC 81 MG tablet Take 81 mg by mouth every morning.    Marland Kitchen atenolol (TENORMIN) 100 MG tablet TAKE 100 MG (1 TAB) BY MOUTH IN THE MORNING AND 50 MG (1/2 TAB) IN THE EVENING. 135 tablet 3  . atorvastatin (LIPITOR) 80 MG tablet Take 1 tablet by mouth every evening for cholesterol. 90 tablet 3  . busPIRone (BUSPAR) 7.5 MG tablet  Take 1 tablet by mouth twice daily for anxiety. 180 tablet 3  . calcium carbonate (CALCIUM 600) 600 MG TABS tablet Take 1,200 mg by mouth daily.    . cetirizine (ZYRTEC) 10 MG tablet Take 10 mg by mouth daily.    . clopidogrel (PLAVIX) 75 MG tablet TAKE 1 TABLET (75 MG TOTAL) BY MOUTH DAILY. PLEASE MAKE APPOINTMENT FOR FURTHER REFILLS 60 tablet 0  . isosorbide mononitrate (  IMDUR) 60 MG 24 hr tablet TAKE 1.5 TABLETS (90 MG TOTAL) BY MOUTH DAILY. 120 tablet 2  . Melatonin 5 MG TABS Take 5 mg by mouth daily as needed (sleep).    . nitroGLYCERIN (NITROSTAT) 0.4 MG SL tablet Place 1 tablet (0.4 mg total) under the tongue every 5 (five) minutes as needed for chest pain. Chest Pain 25 tablet 11  . oxybutynin (DITROPAN-XL) 5 MG 24 hr tablet Take 1 tablet by mouth every evening for overactive bladder. 90 tablet 3  . pantoprazole (PROTONIX) 40 MG tablet Take 1 tablet by mouth once daily for heartburn. 90 tablet 3  . Probiotic Product (PROBIOTIC PO) Take 1 capsule by mouth daily.     . ranitidine (ZANTAC) 150 MG tablet Take 150 mg by mouth daily.     . sertraline (ZOLOFT) 100 MG tablet TAKE 1 TABLET BY MOUTH ONCE DAILY FOR DEPRESSION/ANXIETY. 90 tablet 1  . spironolactone (ALDACTONE) 25 MG tablet TAKE 1/2 TABLET BY MOUTH EVERY DAY 45 tablet 2   No current facility-administered medications on file prior to visit.     BP 132/70   Pulse 65   Temp 98.2 F (36.8 C) (Oral)   Ht 5' (1.524 m)   Wt 132 lb (59.9 kg)   SpO2 96%   BMI 25.78 kg/m    Objective:   Physical Exam  Constitutional: She appears well-nourished.  Cardiovascular: Normal rate.  GI: Soft. Bowel sounds are normal. There is no abdominal tenderness.  Genitourinary: Rectum:     External hemorrhoid and internal hemorrhoid present.     Genitourinary Comments: Several non thrombosed hemorrhoids around external and internal rectum    Skin: Skin is warm and dry.           Assessment & Plan:

## 2018-04-11 NOTE — Assessment & Plan Note (Signed)
To suprapubic region. Exam unremarkable. UA today with 1+ leuks, negative blood, nitrites. Trace glucose. Culture sent. Check CBC, A1C. Could be constipation, discussed fiber/water, stool softener. Await results. She appears stable.

## 2018-04-11 NOTE — Patient Instructions (Addendum)
Apply the anusol-hc cream to the rectum twice daily for 1 week then as needed for hemorrhoids.  You can purchase a donut pillow to reduce discomfort and pressure to the hemorrhoids. You can also try a Sitz bath for discomfort.  Make sure to keep your stools soft with plenty of fiber and water. You can take a stool softener (Colace) daily if needed.  I will be in touch Wednesday with your urine culture results.  Stop by the lab prior to leaving today. I will notify you of your results once received.   It was a pleasure to see you today!   Hemorrhoids Hemorrhoids are swollen veins that may develop:  In the butt (rectum). These are called internal hemorrhoids.  Around the opening of the butt (anus). These are called external hemorrhoids. Hemorrhoids can cause pain, itching, or bleeding. Most of the time, they do not cause serious problems. They usually get better with diet changes, lifestyle changes, and other home treatments. What are the causes? This condition may be caused by:  Having trouble pooping (constipation).  Pushing hard (straining) to poop.  Watery poop (diarrhea).  Pregnancy.  Being very overweight (obese).  Sitting for long periods of time.  Heavy lifting or other activity that causes you to strain.  Anal sex.  Riding a bike for a long period of time. What are the signs or symptoms? Symptoms of this condition include:  Pain.  Itching or soreness in the butt.  Bleeding from the butt.  Leaking poop.  Swelling in the area.  One or more lumps around the opening of your butt. How is this diagnosed? A doctor can often diagnose this condition by looking at the affected area. The doctor may also:  Do an exam that involves feeling the area with a gloved hand (digital rectal exam).  Examine the area inside your butt using a small tube (anoscope).  Order blood tests. This may be done if you have lost a lot of blood.  Have you get a test that involves  looking inside the colon using a flexible tube with a camera on the end (sigmoidoscopy or colonoscopy). How is this treated? This condition can usually be treated at home. Your doctor may tell you to change what you eat, make lifestyle changes, or try home treatments. If these do not help, procedures can be done to remove the hemorrhoids or make them smaller. These may involve:  Placing rubber bands at the base of the hemorrhoids to cut off their blood supply.  Injecting medicine into the hemorrhoids to shrink them.  Shining a type of light energy onto the hemorrhoids to cause them to fall off.  Doing surgery to remove the hemorrhoids or cut off their blood supply. Follow these instructions at home: Eating and drinking   Eat foods that have a lot of fiber in them. These include whole grains, beans, nuts, fruits, and vegetables.  Ask your doctor about taking products that have added fiber (fibersupplements).  Reduce the amount of fat in your diet. You can do this by: ? Eating low-fat dairy products. ? Eating less red meat. ? Avoiding processed foods.  Drink enough fluid to keep your pee (urine) pale yellow. Managing pain and swelling   Take a warm-water bath (sitz bath) for 20 minutes to ease pain. Do this 3-4 times a day. You may do this in a bathtub or using a portable sitz bath that fits over the toilet.  If told, put ice on the painful area.  It may be helpful to use ice between your warm baths. ? Put ice in a plastic bag. ? Place a towel between your skin and the bag. ? Leave the ice on for 20 minutes, 2-3 times a day. General instructions  Take over-the-counter and prescription medicines only as told by your doctor. ? Medicated creams and medicines may be used as told.  Exercise often. Ask your doctor how much and what kind of exercise is best for you.  Go to the bathroom when you have the urge to poop. Do not wait.  Avoid pushing too hard when you poop.  Keep your  butt dry and clean. Use wet toilet paper or moist towelettes after pooping.  Do not sit on the toilet for a long time.  Keep all follow-up visits as told by your doctor. This is important. Contact a doctor if you:  Have pain and swelling that do not get better with treatment or medicine.  Have trouble pooping.  Cannot poop.  Have pain or swelling outside the area of the hemorrhoids. Get help right away if you have:  Bleeding that will not stop. Summary  Hemorrhoids are swollen veins in the butt or around the opening of the butt.  They can cause pain, itching, or bleeding.  Eat foods that have a lot of fiber in them. These include whole grains, beans, nuts, fruits, and vegetables.  Take a warm-water bath (sitz bath) for 20 minutes to ease pain. Do this 3-4 times a day. This information is not intended to replace advice given to you by your health care provider. Make sure you discuss any questions you have with your health care provider. Document Released: 11/26/2007 Document Revised: 07/08/2017 Document Reviewed: 07/08/2017 Elsevier Interactive Patient Education  2019 [employer].

## 2018-04-11 NOTE — Assessment & Plan Note (Signed)
Noted rectally. Negative occult stool card. Rx for anusol cream sent to pharmacy. Discussed donut pillow/sitz baths for discomfort. She will update if discomfort persists.

## 2018-04-12 LAB — URINE CULTURE
MICRO NUMBER:: 173282
Result:: NO GROWTH
SPECIMEN QUALITY:: ADEQUATE

## 2018-05-04 ENCOUNTER — Ambulatory Visit (INDEPENDENT_AMBULATORY_CARE_PROVIDER_SITE_OTHER): Payer: Medicare Other | Admitting: Primary Care

## 2018-05-04 ENCOUNTER — Encounter: Payer: Self-pay | Admitting: Primary Care

## 2018-05-04 VITALS — BP 116/72 | HR 67 | Temp 98.1°F | Ht 60.0 in | Wt 135.2 lb

## 2018-05-04 DIAGNOSIS — K130 Diseases of lips: Secondary | ICD-10-CM | POA: Diagnosis not present

## 2018-05-04 DIAGNOSIS — K1379 Other lesions of oral mucosa: Secondary | ICD-10-CM

## 2018-05-04 MED ORDER — TRIAMCINOLONE ACETONIDE 0.1 % EX CREA: 1.0000 "application " | TOPICAL_CREAM | Freq: Two times a day (BID) | CUTANEOUS | 0 refills | Status: DC

## 2018-05-04 NOTE — Assessment & Plan Note (Signed)
Evident to right corner of mouth. Check HSV panel to rule out any other cause. Rx for Triamcinolone cream sent to pharmacy, discussed instructions for use.

## 2018-05-04 NOTE — Patient Instructions (Addendum)
Use the triamcinolone 0.1% cream twice daily to the mouth for one week. Then use as needed.  Stop by the lab prior to leaving today. I will notify you of your results once received.   Make sure to keep your mouth moisturized with Vaseline, coco butter, lip balm.   Angular Cheilitis   It was a pleasure to see you today!

## 2018-05-04 NOTE — Progress Notes (Signed)
Subjective:    Patient ID: Cindy Brandt, female    DOB: [birth year], 85 y.o.   MRN: [id number]  HPI  Ms. Driscoll is an 85 year old female who presents today with a chief complaint of oral sores.  The sores are located to the right corner of her mouth and mid upper lip that began one week ago. Initially began as blisters with itching, now with cracking to the corner of her right mouth. She's been using [employer] to wash her face, also using neosporin and coco butter to her lips. She does have upper and lower dentures. These symptoms are new.   No changes in foods, soaps, medications. No prior history of herpes.   Review of Systems  Constitutional: Negative for fever.  HENT: Negative for congestion.        Cracked corner of mouth to right side, sore to mid upper lip  Respiratory: Negative for cough.        Past Medical History:  Diagnosis Date  . Allergy   . Anemia   . Anxiety   . Arthritis   . Blood transfusion without reported diagnosis   . Cataract    a. bilerteral cataracts removed  . Coronary atherosclerosis of native coronary artery    a. 2003 Cath/unsuccessful PCI of occluded RCA;  b. 2005 NSTEMI/Cath: CTO RCA w/ L->R collats, LAD 20, LCX 82m, nl EF;  c. 11/2011 Myoview: no ischemia.  . Diastolic dysfunction 52/77/8242   a. 11/2011 Echo: Ejection fraction 60-65%, gr1 DD, basal inferior AK;  b. 09/2013 Echo: EF 55-60%, mild LVH, no rwma, Gr1 DD, mildly dil LA.  . Gastritis    a. 04/2016 EGD: Nl esophagus, gastritis, nl duodenal bulb & 2nd portion of the duodenum.  Marland Kitchen Headache(784.0)   . Hyperlipidemia   . Hypertension   . Hypokalemia   . Tubular adenoma of colon 2013     Social History   Socioeconomic History  . Marital status: Widowed    Spouse name: Not on file  . Number of children: 5  . Years of education: Not on file  . Highest education level: Not on file  Occupational History  . Occupation: Retired  Scientific laboratory technician  . Financial resource strain: Not on file    . Food insecurity:    Worry: Not on file    Inability: Not on file  . Transportation needs:    Medical: Not on file    Non-medical: Not on file  Tobacco Use  . Smoking status: Former Smoker    Packs/day: 0.50    Years: 25.00    Pack years: 12.50    Types: Cigarettes    Last attempt to quit: 04/06/1996    Years since quitting: 22.0  . Smokeless tobacco: Never Used  Substance and Sexual Activity  . Alcohol use: No  . Drug use: No  . Sexual activity: Not on file  Lifestyle  . Physical activity:    Days per week: Not on file    Minutes per session: Not on file  . Stress: Not on file  Relationships  . Social connections:    Talks on phone: Not on file    Gets together: Not on file    Attends religious service: Not on file    Active member of club or organization: Not on file    Attends meetings of clubs or organizations: Not on file    Relationship status: Not on file  . Intimate partner violence:  Fear of current or ex partner: Not on file    Emotionally abused: Not on file    Physically abused: Not on file    Forced sexual activity: Not on file  Other Topics Concern  . Not on file  Social History Narrative   Currently living with dtr in [place].  No regular exercise.    Past Surgical History:  Procedure Laterality Date  . CARPAL TUNNEL RELEASE Bilateral   . CHOLECYSTECTOMY  2004  . COLONOSCOPY  08/06/2011   Procedure: COLONOSCOPY;  Surgeon: [name], MD;  Location: [facility];  Service: Endoscopy;  Laterality: N/A;  10:40 AM  . CORONARY ANGIOGRAPHY N/A 08/02/2017   Procedure: CORONARY ANGIOGRAPHY;  Surgeon: Lorretta Harp, MD;  Location: [facility];  Service: Cardiovascular;  Laterality: N/A;  . GALLBLADDER SURGERY    . LEFT HEART CATH AND CORONARY ANGIOGRAPHY N/A 06/15/2016   Procedure: Left Heart Cath and Coronary Angiography;  Surgeon: Lorretta Harp, MD;  Location: [facility];  Service: Cardiovascular;  Laterality: N/A;  . PARTIAL  HYSTERECTOMY    . repair of belly button    . UMBILICAL HERNIA REPAIR     Umbilical hernia repair as a child    Family History  Problem Relation Age of Onset  . Coronary artery disease Father   . Heart attack Father   . Colon cancer Father   . Coronary artery disease Mother   . Stomach cancer Mother   . Heart disease Brother   . Diabetes Brother   . Pancreatic cancer Neg Hx   . Rectal cancer Neg Hx   . Esophageal cancer Neg Hx     Allergies  Allergen Reactions  . Tylenol [Acetaminophen] Other (See Comments)    Funny feeling in chest.     Current Outpatient Medications on File Prior to Visit  Medication Sig Dispense Refill  . amLODipine (NORVASC) 5 MG tablet Take 1 tablet (5 mg total) by mouth every evening. 90 tablet 3  . aspirin EC 81 MG tablet Take 81 mg by mouth every morning.    Marland Kitchen atenolol (TENORMIN) 100 MG tablet TAKE 100 MG (1 TAB) BY MOUTH IN THE MORNING AND 50 MG (1/2 TAB) IN THE EVENING. 135 tablet 3  . atorvastatin (LIPITOR) 80 MG tablet Take 1 tablet by mouth every evening for cholesterol. 90 tablet 3  . busPIRone (BUSPAR) 7.5 MG tablet Take 1 tablet by mouth twice daily for anxiety. 180 tablet 3  . calcium carbonate (CALCIUM 600) 600 MG TABS tablet Take 1,200 mg by mouth daily.    . cetirizine (ZYRTEC) 10 MG tablet Take 10 mg by mouth daily.    . clopidogrel (PLAVIX) 75 MG tablet TAKE 1 TABLET (75 MG TOTAL) BY MOUTH DAILY. PLEASE MAKE APPOINTMENT FOR FURTHER REFILLS 60 tablet 0  . hydrocortisone (ANUSOL-HC) 2.5 % rectal cream Place 1 application rectally 2 (two) times daily. For hemorrhoids. 30 g 0  . isosorbide mononitrate (IMDUR) 60 MG 24 hr tablet TAKE 1.5 TABLETS (90 MG TOTAL) BY MOUTH DAILY. 120 tablet 2  . Melatonin 5 MG TABS Take 5 mg by mouth daily as needed (sleep).    . nitroGLYCERIN (NITROSTAT) 0.4 MG SL tablet Place 1 tablet (0.4 mg total) under the tongue every 5 (five) minutes as needed for chest pain. Chest Pain 25 tablet 11  . oxybutynin  (DITROPAN-XL) 5 MG 24 hr tablet Take 1 tablet by mouth every evening for overactive bladder. 90 tablet 3  .  pantoprazole (PROTONIX) 40 MG tablet Take 1 tablet by mouth once daily for heartburn. 90 tablet 3  . Probiotic Product (PROBIOTIC PO) Take 1 capsule by mouth daily.     . ranitidine (ZANTAC) 150 MG tablet Take 150 mg by mouth daily.     . sertraline (ZOLOFT) 100 MG tablet TAKE 1 TABLET BY MOUTH ONCE DAILY FOR DEPRESSION/ANXIETY. 90 tablet 1  . spironolactone (ALDACTONE) 25 MG tablet TAKE 1/2 TABLET BY MOUTH EVERY DAY 45 tablet 2   No current facility-administered medications on file prior to visit.     BP 116/72   Pulse 67   Temp 98.1 F (36.7 C) (Oral)   Ht 5' (1.524 m)   Wt 135 lb 4 oz (61.3 kg)   SpO2 95%   BMI 26.41 kg/m    Objective:   Physical Exam  HENT:  Mouth/Throat: No oropharyngeal exudate.  Cracked skin with irritation to right corder of mouth. Mild erythema, dryness to lips. Flesh colored sore to left upper lip.  Respiratory: Effort normal.  Skin: Skin is warm and dry.           Assessment & Plan:

## 2018-05-05 ENCOUNTER — Telehealth: Payer: Self-pay

## 2018-05-05 NOTE — Telephone Encounter (Signed)
Spoken and notified patient of [name] Clark's comments. Patient verbalized understanding.  

## 2018-05-05 NOTE — Telephone Encounter (Signed)
Yes, can use a small amount of a triamcinolone cream to her nose once daily no longer than 5 days. We will be in touch once we receive her lab results.

## 2018-05-05 NOTE — Telephone Encounter (Signed)
I spoke with Cindy Brandt (DPR signed) pt was seen on 05/04/18; Cindy Brandt wanted Cindy Fitz NP to know that the rash started and is still in pts nose. Cindy Brandt wants to know if can use Triamcinolone cream in nose. Cindy Brandt request cb.

## 2018-05-10 LAB — HSV 1/2 AB (IGM), IFA W/RFLX TITER
HSV 1 IgM Screen: NEGATIVE
HSV 2 IGM SCREEN: NEGATIVE

## 2018-05-10 LAB — HSV(HERPES SIMPLEX VRS) I + II AB-IGG
HAV 1 IGG,TYPE SPECIFIC AB: 24.1 index — ABNORMAL HIGH
HSV 2 IGG,TYPE SPECIFIC AB: <0.9 index

## 2018-05-11 NOTE — Telephone Encounter (Signed)
Spoken and notified patient's daughter of Cindy Brandt's comments. Patient's daughter verbalized understanding.  

## 2018-05-11 NOTE — Telephone Encounter (Signed)
Pt's daughter returning your call concerning her mom

## 2018-05-22 ENCOUNTER — Other Ambulatory Visit: Payer: Self-pay | Admitting: Primary Care

## 2018-05-22 DIAGNOSIS — F411 Generalized anxiety disorder: Secondary | ICD-10-CM

## 2018-05-30 ENCOUNTER — Telehealth: Payer: Self-pay | Admitting: *Deleted

## 2018-05-30 DIAGNOSIS — K130 Diseases of lips: Secondary | ICD-10-CM

## 2018-05-30 MED ORDER — TRIAMCINOLONE ACETONIDE 0.1 % EX CREA: 1.0000 "application " | TOPICAL_CREAM | Freq: Two times a day (BID) | CUTANEOUS | 0 refills | Status: DC

## 2018-05-30 NOTE — Telephone Encounter (Signed)
Please notify patient and daughter that I will refill the cream but she will need to be evaluated via WebEx if her symptoms return.

## 2018-05-30 NOTE — Telephone Encounter (Signed)
Patient's daughter called stating that she was in several weeks ago with a rash on her lip and was given a cream.  Cindy Brandt stated that the cream helped and the rash got better, but now the rash has come back on her lip. Cindy Brandt stated that the rash is just like it was when she was seen and wants to know if Allie Bossier NP will send a refill to the pharmacy for her? [place]

## 2018-05-30 NOTE — Telephone Encounter (Signed)
Spoken and notified patient's daughter of [name] Clark's comments. Patient's daughter verbalized understanding.  

## 2018-05-31 ENCOUNTER — Telehealth: Payer: Self-pay | Admitting: Cardiovascular Disease

## 2018-05-31 ENCOUNTER — Other Ambulatory Visit: Payer: Self-pay | Admitting: Cardiovascular Disease

## 2018-05-31 MED ORDER — CLOPIDOGREL BISULFATE 75 MG PO TABS: 75.0000 mg | ORAL_TABLET | Freq: Every day | ORAL | 0 refills | Status: DC

## 2018-05-31 NOTE — Telephone Encounter (Signed)
[facility]ogrel refilled to [facility]. [place]

## 2018-05-31 NOTE — Telephone Encounter (Signed)
Pt calling requesting a refill on [facility]rel [facility] [address] Please address

## 2018-07-07 ENCOUNTER — Other Ambulatory Visit: Payer: Self-pay | Admitting: Cardiovascular Disease

## 2018-07-28 ENCOUNTER — Other Ambulatory Visit: Payer: Self-pay | Admitting: Cardiology

## 2018-07-29 ENCOUNTER — Other Ambulatory Visit: Payer: Self-pay | Admitting: Cardiovascular Disease

## 2018-07-29 DIAGNOSIS — I10 Essential (primary) hypertension: Secondary | ICD-10-CM

## 2018-08-23 ENCOUNTER — Other Ambulatory Visit: Payer: Self-pay | Admitting: Cardiovascular Disease

## 2018-09-04 ENCOUNTER — Other Ambulatory Visit: Payer: Self-pay | Admitting: Cardiovascular Disease

## 2018-09-05 ENCOUNTER — Other Ambulatory Visit: Payer: Self-pay | Admitting: Cardiovascular Disease

## 2018-09-05 MED ORDER — ISOSORBIDE MONONITRATE ER 60 MG PO TB24: 90.0000 mg | ORAL_TABLET | Freq: Every day | ORAL | 0 refills | Status: DC

## 2018-09-05 NOTE — Telephone Encounter (Signed)
New message    *STAT* If patient is at the pharmacy, call can be transferred to refill team.   1. Which medications need to be refilled? (please list name of each medication and dose if known)isosorbide mononitrate (IMDUR) 60 MG 24 hr tablet  2. Which pharmacy/location (including street and city if local pharmacy) is medication to be sent to? [id number] [place], [place] - [address]  3. Do they need a 30 day or 90 day supply?[place]

## 2018-09-06 ENCOUNTER — Telehealth: Payer: Self-pay

## 2018-09-06 NOTE — Telephone Encounter (Signed)
Called pharmacy to see if they received the first rx that was sent over on 09/05/2018 for quantity seventy-five. Pharmacist stated they only received rx that was sent over for quantity of twenty-two. Patient has virtual visit with Kerin Ransom on 09/21/2018. Rx refill sent to pharmacy.   Left message on daughters voicemail informing patient that a refill has been sent over to the pharmacy and pt wont run out of medication. Advised patient/patients daughter to contact the office with any questions or concerns.

## 2018-09-06 NOTE — Telephone Encounter (Signed)
-----   Message from [place] sent at 09/06/2018 10:18 AM EDT ----- Regarding: prescription refill   Tee  This patient is scheduled for a virtual visit on 09-21-18, but, she only has enough isosorbide mononitrate ER 60 mg to last until 09-19-18.  Can you please refill this for her?  Thanks [facility]

## 2018-09-16 ENCOUNTER — Other Ambulatory Visit: Payer: Self-pay | Admitting: *Deleted

## 2018-09-16 ENCOUNTER — Other Ambulatory Visit: Payer: Self-pay | Admitting: Cardiovascular Disease

## 2018-09-16 MED ORDER — ISOSORBIDE MONONITRATE ER 60 MG PO TB24: 90.0000 mg | ORAL_TABLET | Freq: Every day | ORAL | 1 refills | Status: DC

## 2018-09-16 NOTE — Telephone Encounter (Signed)
Outpatient Medication Detail   Disp Refills Start End   isosorbide mononitrate (IMDUR) 60 MG 24 hr tablet 75 tablet 0 09/05/2018    Sig - Route: Take 1.5 tablets (90 mg total) by mouth daily. OFFICE VISIT NEEDED - Oral   Sent to pharmacy as: isosorbide mononitrate (IMDUR) 60 MG 24 hr tablet   E-Prescribing Status: Receipt confirmed by pharmacy (09/05/2018 11:21 AM EDT)   Pharmacy  [id number] - [place], [place]

## 2018-09-20 ENCOUNTER — Telehealth: Payer: Self-pay | Admitting: Cardiology

## 2018-09-20 NOTE — Telephone Encounter (Signed)
LVM, reminding pt of her appt with Kerin Ransom on 09-21-18.

## 2018-09-21 ENCOUNTER — Ambulatory Visit (INDEPENDENT_AMBULATORY_CARE_PROVIDER_SITE_OTHER): Payer: Medicare Other | Admitting: Cardiology

## 2018-09-21 ENCOUNTER — Other Ambulatory Visit: Payer: Self-pay

## 2018-09-21 VITALS — BP 143/82 | HR 74 | Temp 97.3°F | Ht 60.0 in | Wt 138.0 lb

## 2018-09-21 DIAGNOSIS — I5189 Other ill-defined heart diseases: Secondary | ICD-10-CM | POA: Diagnosis not present

## 2018-09-21 DIAGNOSIS — E785 Hyperlipidemia, unspecified: Secondary | ICD-10-CM | POA: Diagnosis not present

## 2018-09-21 DIAGNOSIS — I2510 Atherosclerotic heart disease of native coronary artery without angina pectoris: Secondary | ICD-10-CM | POA: Diagnosis not present

## 2018-09-21 DIAGNOSIS — Z9861 Coronary angioplasty status: Secondary | ICD-10-CM

## 2018-09-21 DIAGNOSIS — I10 Essential (primary) hypertension: Secondary | ICD-10-CM | POA: Diagnosis not present

## 2018-09-21 MED ORDER — ATENOLOL 100 MG PO TABS: 100.0000 mg | ORAL_TABLET | Freq: Every day | ORAL | 1 refills | Status: DC

## 2018-09-21 MED ORDER — ISOSORBIDE MONONITRATE ER 120 MG PO TB24: 120.0000 mg | ORAL_TABLET | Freq: Every day | ORAL | 1 refills | Status: DC

## 2018-09-21 MED ORDER — CLOPIDOGREL BISULFATE 75 MG PO TABS: 75.0000 mg | ORAL_TABLET | Freq: Every day | ORAL | 3 refills | Status: DC

## 2018-09-21 NOTE — Assessment & Plan Note (Signed)
Grade 2 DD

## 2018-09-21 NOTE — Assessment & Plan Note (Signed)
Chest pain and exertional fatigue, onset over the last month.

## 2018-09-21 NOTE — Patient Instructions (Addendum)
Medication Instructions:  INCREASE Imdur to 120mg  Take 1 tablet daily every morning  DECREASE Atenolol to 100mg  Take 1 tablet every morning  If you need a refill on your cardiac medications before your next appointment, please call your pharmacy.   Lab work: None  If you have labs (blood work) drawn today and your tests are completely normal, you will receive your results only by: Marland Kitchen MyChart Message (if you have MyChart) OR . A paper copy in the mail If you have any lab test that is abnormal or we need to change your treatment, we will call you to review the results.  Testing/Procedures: None   Follow-Up: At [facility], you and your health needs are our priority.  As part of our continuing mission to provide you with exceptional heart care, we have created designated Provider Care Teams.  These Care Teams include your primary Cardiologist (physician) and Advanced Practice Providers (APPs -  Physician Assistants and Nurse Practitioners) who all work together to provide you with the care you need, when you need it. You will need a follow up appointment in 6-8 weeks.  Please call our office 2 months in advance to schedule this appointment.  You may see Quay Burow, MD or one of the following Advanced Practice Providers on your designated Care Team:   Kerin Ransom, PA-C Roby Lofts, [place] . Sande Rives, PA-C  Any Other Special Instructions Will Be Listed Below (If Applicable).

## 2018-09-21 NOTE — Progress Notes (Signed)
Cardiology Office Note:    Date:  09/21/2018   ID:  Cindy Brandt, DOB 1931-09-27, MRN [id number]  PCP:  [name], NP  Cardiologist:  Quay Burow, MD  Electrophysiologist:  None   Referring MD: [name], NP   No chief complaint on file.   History of Present Illness:    Cindy Brandt is a pleasant  85 y.o. female with a hx of CAD, s/p RCA PCI in 2005, Cath in April 2018 revealed an occluded RCA with left right collaterals, occluded nondominant circumflex, high-grade ostial ramus branch stenosis and 50% proximal LAD stenosis. She had normal LV function with grade 2 diastolic dysfunction.Medical therapy was recommended.   She was seen in the office in May 2019 and the pt's daughter reported the patient was using a lot of SL NTG.  She was admitted for cath 08/02/17. This revealed the RI to be occluded but no change in the 50% LAD which supplied collaterals to the RCA. The plan is for continued medical Rx.  Her LOV was post cath in June 2019.  She is in the office today for follow up.   Since we saw her last she had been doing well until about two weeks ago when she noted increasing chest discomfort and SSCP walking upstairs.  She took SL NTG once about two weeks ago for the first time in a year. She says she feels like she is going to "fall out" when she gets to the top of the stairs.  No rest symptoms.    Past Medical History:  Diagnosis Date  . Allergy   . Anemia   . Anxiety   . Arthritis   . Blood transfusion without reported diagnosis   . Cataract    a. bilerteral cataracts removed  . Coronary atherosclerosis of native coronary artery    a. 2003 Cath/unsuccessful PCI of occluded RCA;  b. 2005 NSTEMI/Cath: CTO RCA w/ L->R collats, LAD 20, LCX 28m, nl EF;  c. 11/2011 Myoview: no ischemia.  . Diastolic dysfunction 30/86/5784   a. 11/2011 Echo: Ejection fraction 60-65%, gr1 DD, basal inferior AK;  b. 09/2013 Echo: EF 55-60%, mild LVH, no rwma, Gr1 DD, mildly dil LA.   . Gastritis    a. 04/2016 EGD: Nl esophagus, gastritis, nl duodenal bulb & 2nd portion of the duodenum.  Marland Kitchen Headache(784.0)   . Hyperlipidemia   . Hypertension   . Hypokalemia   . Tubular adenoma of colon 2013    Past Surgical History:  Procedure Laterality Date  . CARPAL TUNNEL RELEASE Bilateral   . CHOLECYSTECTOMY  2004  . COLONOSCOPY  08/06/2011   Procedure: COLONOSCOPY;  Surgeon: [name], MD;  Location: [facility];  Service: Endoscopy;  Laterality: N/A;  10:40 AM  . CORONARY ANGIOGRAPHY N/A 08/02/2017   Procedure: CORONARY ANGIOGRAPHY;  Surgeon: Lorretta Harp, MD;  Location: [facility];  Service: Cardiovascular;  Laterality: N/A;  . GALLBLADDER SURGERY    . LEFT HEART CATH AND CORONARY ANGIOGRAPHY N/A 06/15/2016   Procedure: Left Heart Cath and Coronary Angiography;  Surgeon: Lorretta Harp, MD;  Location: [facility];  Service: Cardiovascular;  Laterality: N/A;  . PARTIAL HYSTERECTOMY    . repair of belly button    . UMBILICAL HERNIA REPAIR     Umbilical hernia repair as a child    Current Medications: Current Meds  Medication Sig  . amLODipine (NORVASC) 5 MG tablet TAKE 1 TABLET BY MOUTH EVERY  DAY IN THE EVENING  . aspirin EC 81 MG tablet Take 81 mg by mouth every morning.  Marland Kitchen atenolol (TENORMIN) 100 MG tablet Take 1 tablet (100 mg total) by mouth daily.  Marland Kitchen atorvastatin (LIPITOR) 80 MG tablet Take 1 tablet by mouth every evening for cholesterol.  . busPIRone (BUSPAR) 7.5 MG tablet Take 1 tablet by mouth twice daily for anxiety.  . calcium carbonate (CALCIUM 600) 600 MG TABS tablet Take 1,200 mg by mouth daily.  . cetirizine (ZYRTEC) 10 MG tablet Take 10 mg by mouth daily.  . clopidogrel (PLAVIX) 75 MG tablet Take 1 tablet (75 mg total) by mouth daily.  . isosorbide mononitrate (IMDUR) 120 MG 24 hr tablet Take 1 tablet (120 mg total) by mouth daily.  . Melatonin 5 MG TABS Take 5 mg by mouth daily as needed (sleep).  . nitroGLYCERIN (NITROSTAT) 0.4  MG SL tablet PLACE 1 TABLET UNDER THE TONGUE EVERY 5 MIN AS NEEDED FOR CHEST PAIN  . oxybutynin (DITROPAN-XL) 5 MG 24 hr tablet Take 1 tablet by mouth every evening for overactive bladder.  . pantoprazole (PROTONIX) 40 MG tablet Take 1 tablet by mouth once daily for heartburn.  . ranitidine (ZANTAC) 150 MG tablet Take 150 mg by mouth daily.   . sertraline (ZOLOFT) 100 MG tablet TAKE 1 TABLET BY MOUTH ONCE DAILY FOR DEPRESSION/ANXIETY.  Marland Kitchen spironolactone (ALDACTONE) 25 MG tablet TAKE 1/2 TABLET BY MOUTH EVERY DAY  . [DISCONTINUED] atenolol (TENORMIN) 100 MG tablet TAKE 100 MG (1 TAB) BY MOUTH IN THE MORNING AND 50 MG (1/2 TAB) IN THE EVENING.  . [DISCONTINUED] clopidogrel (PLAVIX) 75 MG tablet TAKE 1 TABLET BY MOUTH EVERY DAY  . [DISCONTINUED] hydrocortisone (ANUSOL-HC) 2.5 % rectal cream Place 1 application rectally 2 (two) times daily. For hemorrhoids.  . [DISCONTINUED] isosorbide mononitrate (IMDUR) 60 MG 24 hr tablet Take 1.5 tablets (90 mg total) by mouth daily. PT OVERDUE FOR OV PLEASE CALL FOR APPT  . [DISCONTINUED] isosorbide mononitrate (IMDUR) 60 MG 24 hr tablet Take 1.5 tablets (90 mg total) by mouth daily. KEEP OFFICE VISIT FOR FUTURE REFILLS  . [DISCONTINUED] Probiotic Product (PROBIOTIC PO) Take 1 capsule by mouth daily.   . [DISCONTINUED] triamcinolone cream (KENALOG) 0.1 % Apply 1 application topically 2 (two) times daily.     Allergies:   Tylenol [acetaminophen]   Social History   Socioeconomic History  . Marital status: Widowed    Spouse name: Not on file  . Number of children: 5  . Years of education: Not on file  . Highest education level: Not on file  Occupational History  . Occupation: Retired  Scientific laboratory technician  . Financial resource strain: Not on file  . Food insecurity    Worry: Not on file    Inability: Not on file  . Transportation needs    Medical: Not on file    Non-medical: Not on file  Tobacco Use  . Smoking status: Former Smoker    Packs/day: 0.50     Years: 25.00    Pack years: 12.50    Types: Cigarettes    Quit date: 04/06/1996    Years since quitting: 22.4  . Smokeless tobacco: Never Used  Substance and Sexual Activity  . Alcohol use: No  . Drug use: No  . Sexual activity: Not on file  Lifestyle  . Physical activity    Days per week: Not on file    Minutes per session: Not on file  . Stress: Not on file  Relationships  . Social Herbalist on phone: Not on file    Gets together: Not on file    Attends religious service: Not on file    Active member of club or organization: Not on file    Attends meetings of clubs or organizations: Not on file    Relationship status: Not on file  Other Topics Concern  . Not on file  Social History Narrative   Currently living with dtr in [place].  No regular exercise.     Family History: The patient's family history includes Colon cancer in her father; Coronary artery disease in her father and mother; Diabetes in her brother; Heart attack in her father; Heart disease in her brother; Stomach cancer in her mother. There is no history of Pancreatic cancer, Rectal cancer, or Esophageal cancer.  ROS:   Please see the history of present illness.     All other systems reviewed and are negative.  EKGs/Labs/Other Studies Reviewed:    The following studies were reviewed today:   EKG:  EKG is ordered today.  The ekg ordered today demonstrates NSR- SB 54 poor anterior RW V3  Recent Labs: 11/15/2017: ALT 17 04/11/2018: BUN 23; Creatinine, Ser 1.04; Hemoglobin 12.6; Platelets 273.0; Potassium 4.3; Sodium 137  Recent Lipid Panel    Component Value Date/Time   CHOL 109 11/15/2017 1108   TRIG 64.0 11/15/2017 1108   HDL 45.80 11/15/2017 1108   CHOLHDL 2 11/15/2017 1108   VLDL 12.8 11/15/2017 1108   LDLCALC 51 11/15/2017 1108    Physical Exam:    VS:  BP (!) 143/82   Pulse 74   Temp (!) 97.3 F (36.3 C)   Ht 5' (1.524 m)   Wt 138 lb (62.6 kg)   SpO2 92%   BMI 26.95 kg/m     Wt  Readings from Last 3 Encounters:  09/21/18 138 lb (62.6 kg)  05/04/18 135 lb 4 oz (61.3 kg)  04/11/18 132 lb (59.9 kg)     GEN:  Well nourished, well developed in no acute distress HEENT: Normal-HOH NECK: No JVD; No carotid bruits LYMPHATICS: No lymphadenopathy CARDIAC: soft heart sounds RRR, no murmurs, rubs, gallops RESPIRATORY:  Clear to auscultation without rales, wheezing or rhonchi  ABDOMEN: Soft, non-tender, non-distended MUSCULOSKELETAL:  No edema; No deformity  SKIN: Warm and dry NEUROLOGIC:  Alert and oriented x 3 PSYCHIATRIC:  Normal affect   ASSESSMENT:    CAD S/P percutaneous coronary angioplasty RCA PCI in 2005. Cath April 2018- occluded RCA, occluded non dominant CFX, high grade RI, 50% LAD with L-R collaterals Normal LVF. Plan is medical Rx Re look cath 08/02/17 for chest pain- no real change except CFX now occluded, LAD unchanged-50%- continue medical rx   Chest pain Chest pain and exertional fatigue, onset over the last month.   Essential hypertension B/P controlled though she is pretty bradycardic for her age and I wonder if that is continuing to her symptoms.   Dyslipidemia, goal LDL below 70 On Lipitor 80 mg  Diastolic dysfunction Grade 2 DD  PLAN:    I suggested she decrease her Tenormin to 100 mg daily and increase her Imdur to 120 mg daily.  We discussed adding Ranexa but they would prefer to hold of on a new medications.  F/U Dr Gwenlyn Found in 6-8 weeks.    Medication Adjustments/Labs and Tests Ordered: Current medicines are reviewed at length with the patient today.  Concerns regarding medicines are outlined above.  Orders Placed  This Encounter  Procedures  . EKG 12-Lead   Meds ordered this encounter  Medications  . clopidogrel (PLAVIX) 75 MG tablet    Sig: Take 1 tablet (75 mg total) by mouth daily.    Dispense:  90 tablet    Refill:  3  . isosorbide mononitrate (IMDUR) 120 MG 24 hr tablet    Sig: Take 1 tablet (120 mg total) by mouth daily.     Dispense:  90 tablet    Refill:  1  . atenolol (TENORMIN) 100 MG tablet    Sig: Take 1 tablet (100 mg total) by mouth daily.    Dispense:  90 tablet    Refill:  1    Patient Instructions  Medication Instructions:  INCREASE Imdur to 120mg  Take 1 tablet daily every morning  DECREASE Atenolol to 100mg  Take 1 tablet every morning  If you need a refill on your cardiac medications before your next appointment, please call your pharmacy.   Lab work: None  If you have labs (blood work) drawn today and your tests are completely normal, you will receive your results only by: Marland Kitchen MyChart Message (if you have MyChart) OR . A paper copy in the mail If you have any lab test that is abnormal or we need to change your treatment, we will call you to review the results.  Testing/Procedures: None   Follow-Up: At [facility], you and your health needs are our priority.  As part of our continuing mission to provide you with exceptional heart care, we have created designated Provider Care Teams.  These Care Teams include your primary Cardiologist (physician) and Advanced Practice Providers (APPs -  Physician Assistants and Nurse Practitioners) who all work together to provide you with the care you need, when you need it. You will need a follow up appointment in 6-8 weeks.  Please call our office 2 months in advance to schedule this appointment.  You may see Quay Burow, MD or one of the following Advanced Practice Providers on your designated Care Team:   Kerin Ransom, PA-C Roby Lofts, [place] . Sande Rives, PA-C  Any Other Special Instructions Will Be Listed Below (If Applicable).       Signed, Kerin Ransom, PA-C  09/21/2018 1:46 PM    [facility]

## 2018-09-21 NOTE — Assessment & Plan Note (Signed)
On Lipitor 80 mg

## 2018-09-21 NOTE — Assessment & Plan Note (Signed)
B/P controlled though she is pretty bradycardic for her age and I wonder if that is continuing to her symptoms.

## 2018-09-21 NOTE — Assessment & Plan Note (Signed)
RCA PCI in 2005. Cath April 2018- occluded RCA, occluded non dominant CFX, high grade RI, 50% LAD with L-R collaterals Normal LVF. Plan is medical Rx Re look cath 08/02/17 for chest pain- no real change except CFX now occluded, LAD unchanged-50%- continue medical rx

## 2018-10-28 ENCOUNTER — Other Ambulatory Visit: Payer: Self-pay | Admitting: Primary Care

## 2018-10-28 DIAGNOSIS — F411 Generalized anxiety disorder: Secondary | ICD-10-CM

## 2018-11-09 ENCOUNTER — Other Ambulatory Visit: Payer: Self-pay | Admitting: Primary Care

## 2018-11-09 DIAGNOSIS — E785 Hyperlipidemia, unspecified: Secondary | ICD-10-CM

## 2018-11-17 ENCOUNTER — Other Ambulatory Visit: Payer: Self-pay | Admitting: Primary Care

## 2018-11-17 DIAGNOSIS — F411 Generalized anxiety disorder: Secondary | ICD-10-CM

## 2018-11-19 ENCOUNTER — Other Ambulatory Visit: Payer: Self-pay | Admitting: Primary Care

## 2018-11-19 DIAGNOSIS — R32 Unspecified urinary incontinence: Secondary | ICD-10-CM

## 2018-11-23 ENCOUNTER — Other Ambulatory Visit: Payer: Self-pay | Admitting: Cardiology

## 2018-11-30 ENCOUNTER — Encounter: Payer: Self-pay | Admitting: Cardiovascular Disease

## 2018-11-30 ENCOUNTER — Ambulatory Visit (INDEPENDENT_AMBULATORY_CARE_PROVIDER_SITE_OTHER): Payer: Medicare Other | Admitting: Cardiovascular Disease

## 2018-11-30 ENCOUNTER — Other Ambulatory Visit: Payer: Self-pay

## 2018-11-30 DIAGNOSIS — I2510 Atherosclerotic heart disease of native coronary artery without angina pectoris: Secondary | ICD-10-CM

## 2018-11-30 DIAGNOSIS — Z9861 Coronary angioplasty status: Secondary | ICD-10-CM | POA: Diagnosis not present

## 2018-11-30 DIAGNOSIS — I10 Essential (primary) hypertension: Secondary | ICD-10-CM

## 2018-11-30 DIAGNOSIS — E785 Hyperlipidemia, unspecified: Secondary | ICD-10-CM

## 2018-11-30 NOTE — Assessment & Plan Note (Signed)
Long history of CAD status post RCA PCI in 2005.  Cath April 2018 revealed occluded RCA, occluded nondominant circumflex, high-grade ramus branch and moderate LAD disease with left-to-right collaterals and normal LV function.  Relook cath by myself 08/02/2017 revealed unchanged anatomy and medical therapy was recommended.  She did see Kerin Ransom in the office 09/21/2018 complaining of increased use of sublingual nitroglycerin although since that time this has become less frequent.  He offered adding ranolazine although she declined another medication.

## 2018-11-30 NOTE — Assessment & Plan Note (Signed)
History of dyslipidemia on statin therapy with lipid profile performed 11/15/2017 revealing total cholesterol 109, LDL 51 and HDL of 45.

## 2018-11-30 NOTE — Patient Instructions (Signed)
Medication Instructions:  Your physician recommends that you continue on your current medications as directed. Please refer to the Current Medication list given to you today.  If you need a refill on your cardiac medications before your next appointment, please call your pharmacy.   Lab work: NONE If you have labs (blood work) drawn today and your tests are completely normal, you will receive your results only by: Marland Kitchen MyChart Message (if you have MyChart) OR . A paper copy in the mail If you have any lab test that is abnormal or we need to change your treatment, we will call you to review the results.  Testing/Procedures: NONE  Follow-Up: At [facility], you and your health needs are our priority.  As part of our continuing mission to provide you with exceptional heart care, we have created designated Provider Care Teams.  These Care Teams include your primary Cardiologist (physician) and Advanced Practice Providers (APPs -  Physician Assistants and Nurse Practitioners) who all work together to provide you with the care you need, when you need it. You will need a follow up appointment in 3 months with Cindy Ransom, PA-C and in 6 months with Cindy Brandt.  Please call our office 2 months in advance to schedule this/each appointment.

## 2018-11-30 NOTE — Assessment & Plan Note (Signed)
History of essential hypertension with blood pressure measured today 142/76.  She is on amlodipine, atenolol.

## 2018-11-30 NOTE — Progress Notes (Signed)
11/30/2018 Cindy Brandt   1931-05-15  [id number]  Primary Physician [name], NP Primary Cardiologist: Lorretta Harp MD [name], [place]  HPI:  Cindy Brandt is a 85 y.o. widowed African-American female mother of 2 children he was accompanied by her daughter Cindy Brandt. I last saw her in the office 12/18/2016.  she has a h/o CAD (CTO of RCA &nonobs LAD/LCX dzs - 2005), HTN, HL, Diast Dysfxn, hypokalemia, and gastritis (EGD 04/2016), who presented on transfer from [facility] for admission after presentation w/ nitrate responsive chest pain and mild troponin elevation. I performed cardiac catheterization on her 06/15/16 revealed an occluded RCA with left right collaterals, occluded nondominant circumflex, high-grade ostial ramus branch stenosis and 50% proximal LAD stenosis. She had normal LV function with grade 2 diastolic dysfunction. She is on optimal medical therapy. She does suffer from anxiety. She has nocturnal chest pain especially when she is recumbent with which sounds more like GERD. She is on an H2 antagonist.  She saw Kerin Ransom in the office 09/21/2018 complaining of increased nitroglycerin use.  He offered at recent renal exam but she declined.  Since that time she is had decreased frequency of nitrate responsive chest pain.     Current Meds  Medication Sig  . amLODipine (NORVASC) 5 MG tablet TAKE 1 TABLET BY MOUTH EVERY DAY IN THE EVENING  . aspirin EC 81 MG tablet Take 81 mg by mouth every morning.  Marland Kitchen atenolol (TENORMIN) 100 MG tablet Take 1 tablet (100 mg total) by mouth daily.  Marland Kitchen atorvastatin (LIPITOR) 80 MG tablet TAKE 1 TABLET BY MOUTH EVERY DAY IN THE EVENING FOR CHOLESTEROL  . busPIRone (BUSPAR) 7.5 MG tablet TAKE 1 TABLET BY MOUTH TWICE DAILY FOR ANXIETY.  . calcium carbonate (CALCIUM 600) 600 MG TABS tablet Take 1,200 mg by mouth daily.  . cetirizine (ZYRTEC) 10 MG tablet Take 10 mg by mouth daily.  . clopidogrel (PLAVIX) 75 MG tablet Take 1 tablet (75 mg  total) by mouth daily.  . isosorbide mononitrate (IMDUR) 120 MG 24 hr tablet Take 1 tablet (120 mg total) by mouth daily.  . Melatonin 5 MG TABS Take 5 mg by mouth daily as needed (sleep).  . nitroGLYCERIN (NITROSTAT) 0.4 MG SL tablet PLACE 1 TABLET UNDER THE TONGUE EVERY 5 MIN AS NEEDED FOR CHEST PAIN  . oxybutynin (DITROPAN-XL) 5 MG 24 hr tablet TAKE 1 TABLET BY MOUTH EVERY EVENING FOR OVERACTIVE BLADDER.  . pantoprazole (PROTONIX) 40 MG tablet Take 1 tablet by mouth once daily for heartburn.  . ranitidine (ZANTAC) 150 MG tablet Take 150 mg by mouth daily.   . sertraline (ZOLOFT) 100 MG tablet Take 1 tablet by mouth once daily for depression/anxiety. NEED APPOINTMENT FOR ANY MORE REFILLS  . spironolactone (ALDACTONE) 25 MG tablet TAKE 1/2 TABLET BY MOUTH EVERY DAY     Allergies  Allergen Reactions  . Tylenol [Acetaminophen] Other (See Comments)    Funny feeling in chest.     Social History   Socioeconomic History  . Marital status: Widowed    Spouse name: Not on file  . Number of children: 5  . Years of education: Not on file  . Highest education level: Not on file  Occupational History  . Occupation: Retired  Scientific laboratory technician  . Financial resource strain: Not on file  . Food insecurity    Worry: Not on file    Inability: Not on file  . Transportation needs  Medical: Not on file    Non-medical: Not on file  Tobacco Use  . Smoking status: Former Smoker    Packs/day: 0.50    Years: 25.00    Pack years: 12.50    Types: Cigarettes    Quit date: 04/06/1996    Years since quitting: 22.6  . Smokeless tobacco: Never Used  Substance and Sexual Activity  . Alcohol use: No  . Drug use: No  . Sexual activity: Not on file  Lifestyle  . Physical activity    Days per week: Not on file    Minutes per session: Not on file  . Stress: Not on file  Relationships  . Social Herbalist on phone: Not on file    Gets together: Not on file    Attends religious service: Not on  file    Active member of club or organization: Not on file    Attends meetings of clubs or organizations: Not on file    Relationship status: Not on file  . Intimate partner violence    Fear of current or ex partner: Not on file    Emotionally abused: Not on file    Physically abused: Not on file    Forced sexual activity: Not on file  Other Topics Concern  . Not on file  Social History Narrative   Currently living with dtr in [place].  No regular exercise.     Review of Systems: General: negative for chills, fever, night sweats or weight changes.  Cardiovascular: negative for chest pain, dyspnea on exertion, edema, orthopnea, palpitations, paroxysmal nocturnal dyspnea or shortness of breath Dermatological: negative for rash Respiratory: negative for cough or wheezing Urologic: negative for hematuria Abdominal: negative for nausea, vomiting, diarrhea, bright red blood per rectum, melena, or hematemesis Neurologic: negative for visual changes, syncope, or dizziness All other systems reviewed and are otherwise negative except as noted above.    Blood pressure (!) 142/76, pulse 67, temperature 97.7 F (36.5 C), height 5' (1.524 m), weight 143 lb (64.9 kg).  General appearance: alert and no distress Neck: no adenopathy, no carotid bruit, no JVD, supple, symmetrical, trachea midline and thyroid not enlarged, symmetric, no tenderness/mass/nodules Lungs: clear to auscultation bilaterally Heart: regular rate and rhythm, S1, S2 normal, no murmur, click, rub or gallop Extremities: extremities normal, atraumatic, no cyanosis or edema Pulses: 2+ and symmetric Skin: Skin color, texture, turgor normal. No rashes or lesions Neurologic: Alert and oriented X 3, normal strength and tone. Normal symmetric reflexes. Normal coordination and gait  EKG not performed today  ASSESSMENT AND PLAN:   Dyslipidemia, goal LDL below 70 History of dyslipidemia on statin therapy with lipid profile performed  11/15/2017 revealing total cholesterol 109, LDL 51 and HDL of 45.  Essential hypertension History of essential hypertension with blood pressure measured today 142/76.  She is on amlodipine, atenolol.  CAD S/P percutaneous coronary angioplasty Long history of CAD status post RCA PCI in 2005.  Cath April 2018 revealed occluded RCA, occluded nondominant circumflex, high-grade ramus branch and moderate LAD disease with left-to-right collaterals and normal LV function.  Relook cath by myself 08/02/2017 revealed unchanged anatomy and medical therapy was recommended.  She did see Kerin Ransom in the office 09/21/2018 complaining of increased use of sublingual nitroglycerin although since that time this has become less frequent.  He offered adding ranolazine although she declined another medication.      Lorretta Harp MD FACP,FACC,FAHA, [facility] 11/30/2018 4:18 PM

## 2018-12-05 ENCOUNTER — Encounter: Payer: Self-pay | Admitting: Primary Care

## 2018-12-05 ENCOUNTER — Ambulatory Visit (INDEPENDENT_AMBULATORY_CARE_PROVIDER_SITE_OTHER): Payer: Medicare Other | Admitting: Primary Care

## 2018-12-05 ENCOUNTER — Other Ambulatory Visit: Payer: Self-pay

## 2018-12-05 VITALS — BP 126/84 | HR 64 | Temp 97.7°F | Ht 60.0 in | Wt 144.5 lb

## 2018-12-05 DIAGNOSIS — R7303 Prediabetes: Secondary | ICD-10-CM

## 2018-12-05 DIAGNOSIS — Z23 Encounter for immunization: Secondary | ICD-10-CM | POA: Diagnosis not present

## 2018-12-05 DIAGNOSIS — I10 Essential (primary) hypertension: Secondary | ICD-10-CM

## 2018-12-05 DIAGNOSIS — K219 Gastro-esophageal reflux disease without esophagitis: Secondary | ICD-10-CM

## 2018-12-05 DIAGNOSIS — K2100 Gastro-esophageal reflux disease with esophagitis, without bleeding: Secondary | ICD-10-CM

## 2018-12-05 DIAGNOSIS — I2510 Atherosclerotic heart disease of native coronary artery without angina pectoris: Secondary | ICD-10-CM

## 2018-12-05 DIAGNOSIS — K649 Unspecified hemorrhoids: Secondary | ICD-10-CM

## 2018-12-05 DIAGNOSIS — R32 Unspecified urinary incontinence: Secondary | ICD-10-CM

## 2018-12-05 DIAGNOSIS — F411 Generalized anxiety disorder: Secondary | ICD-10-CM

## 2018-12-05 DIAGNOSIS — Z9861 Coronary angioplasty status: Secondary | ICD-10-CM

## 2018-12-05 DIAGNOSIS — E785 Hyperlipidemia, unspecified: Secondary | ICD-10-CM

## 2018-12-05 DIAGNOSIS — F419 Anxiety disorder, unspecified: Secondary | ICD-10-CM

## 2018-12-05 DIAGNOSIS — F329 Major depressive disorder, single episode, unspecified: Secondary | ICD-10-CM

## 2018-12-05 DIAGNOSIS — M159 Polyosteoarthritis, unspecified: Secondary | ICD-10-CM

## 2018-12-05 LAB — HEMOGLOBIN A1C: Hgb A1c MFr Bld: 6.4 % (ref 4.6–6.5)

## 2018-12-05 LAB — LIPID PANEL
Cholesterol: 118 mg/dL (ref 0–200)
HDL: 54.1 mg/dL (ref >39.00)
LDL Cholesterol: 51 mg/dL (ref 0–99)
NonHDL: 64.14
Total CHOL/HDL Ratio: 2
Triglycerides: 64 mg/dL (ref 0.0–149.0)
VLDL: 12.8 mg/dL (ref 0.0–40.0)

## 2018-12-05 LAB — COMPREHENSIVE METABOLIC PANEL
ALT: 15 U/L (ref 0–35)
AST: 20 U/L (ref 0–37)
Albumin: 3.9 g/dL (ref 3.5–5.2)
Alkaline Phosphatase: 47 U/L (ref 39–117)
BUN: 15 mg/dL (ref 6–23)
CO2: 30 mEq/L (ref 19–32)
Calcium: 9.4 mg/dL (ref 8.4–10.5)
Chloride: 105 mEq/L (ref 96–112)
Creatinine, Ser: 0.86 mg/dL (ref 0.40–1.20)
GFR: 75.83 mL/min (ref >60.00)
Glucose, Bld: 124 mg/dL — ABNORMAL HIGH (ref 70–99)
Potassium: 4.4 mEq/L (ref 3.5–5.1)
Sodium: 139 mEq/L (ref 135–145)
Total Bilirubin: 0.7 mg/dL (ref 0.2–1.2)
Total Protein: 6.8 g/dL (ref 6.0–8.3)

## 2018-12-05 MED ORDER — MIRABEGRON ER 25 MG PO TB24: 25.0000 mg | ORAL_TABLET | Freq: Every day | ORAL | 0 refills | Status: DC

## 2018-12-05 MED ORDER — BUSPIRONE HCL 7.5 MG PO TABS: ORAL_TABLET | ORAL | 3 refills | Status: DC

## 2018-12-05 MED ORDER — PANTOPRAZOLE SODIUM 40 MG PO TBEC: DELAYED_RELEASE_TABLET | ORAL | 3 refills | Status: DC

## 2018-12-05 NOTE — Patient Instructions (Addendum)
Stop by the lab prior to leaving today. I will notify you of your results once received.   Stop oxybutynin XL 5 mg for bladder control. Try mirabegron (Myrbetriq) 25 mg once daily for bladder control.  Follow up with the heart doctor as scheduled.  It was a pleasure to see you today!

## 2018-12-05 NOTE — Assessment & Plan Note (Signed)
Repeat A1C pending. 

## 2018-12-05 NOTE — Assessment & Plan Note (Signed)
Overall feels well managed, intermittent breakthrough symptom. Continue to monitor.

## 2018-12-05 NOTE — Assessment & Plan Note (Signed)
Intermittent, discussed to increase fiber intake.

## 2018-12-05 NOTE — Assessment & Plan Note (Signed)
Compliant to statin therapy. Repeat lipids pending. 

## 2018-12-05 NOTE — Assessment & Plan Note (Signed)
No improvement with oxybutynin, continues to saturations pads during the day. Stop oxybutynin, switch to Myrbetriq. Patient's daughter will update.

## 2018-12-05 NOTE — Assessment & Plan Note (Signed)
Stable in the office today, continue current regimen. 

## 2018-12-05 NOTE — Progress Notes (Signed)
Subjective:    Patient ID: Cindy Brandt, female    DOB: 1932/01/02, 85 y.o.   MRN: [id number]  HPI  Cindy Brandt is an 85 year old female who presents today for follow up of chronic conditions. Her daughter is with her today who is supplying information for HPI.  1) Essential Hypertension/CAD/NSTEMI: Currently following with cardiology. Managed on atenolol 100 mg, clopidogrel 75 mg, spironolactone 25 mg, atorvastatin, isosorbide mononitrate, nitroglycerin PRN.   She has had intermittent chest pain, is using nitroglycerin with improvement. Evaluated by her cardiologist on 11/30/18, due again in 3 months.   BP Readings from Last 3 Encounters:  12/05/18 126/84  11/30/18 (!) 142/76  09/21/18 (!) 143/82     2) GERD: Currently managed on pantoprazole 40 mg daily and famotidine 20 mg daily. She does experience breakthrough reflux intermittently.   3) Anxiety and Depression: Currently managed on sertraline 100 mg daily, buspirone 7.5 mg BID, Melatonin. Overall she feels anxiety is overall well managed. Denies SI/HI.  4) Urinary Incontinence: Currently managed on oxybutynin XL 5 mg. No improvement with oxybutynin, will wet herself 2-3 times daily. Wears pads/depends. Denies dry mouth. Based off of records she was once on Myrbetric, not sure if this was too expensive or not covered by the insurance. She would like to retry.   Review of Systems  Constitutional: Negative for fatigue.  Respiratory: Negative for shortness of breath.   Cardiovascular:       Intermittent chest pain  Genitourinary:       Urinary incontinence   Neurological: Negative for dizziness and headaches.       Past Medical History:  Diagnosis Date  . Allergy   . Anemia   . Anxiety   . Arthritis   . Blood transfusion without reported diagnosis   . Cataract    a. bilerteral cataracts removed  . Coronary atherosclerosis of native coronary artery    a. 2003 Cath/unsuccessful PCI of occluded RCA;  b. 2005 NSTEMI/Cath:  CTO RCA w/ L->R collats, LAD 20, LCX 49m, nl EF;  c. 11/2011 Myoview: no ischemia.  . Diastolic dysfunction [id number]   a. 11/2011 Echo: Ejection fraction 60-65%, gr1 DD, basal inferior AK;  b. 09/2013 Echo: EF 55-60%, mild LVH, no rwma, Gr1 DD, mildly dil LA.  . Gastritis    a. 04/2016 EGD: Nl esophagus, gastritis, nl duodenal bulb & 2nd portion of the duodenum.  Marland Kitchen Headache(784.0)   . Hyperlipidemia   . Hypertension   . Hypokalemia   . Tubular adenoma of colon 2013     Social History   Socioeconomic History  . Marital status: Widowed    Spouse name: Not on file  . Number of children: 5  . Years of education: Not on file  . Highest education level: Not on file  Occupational History  . Occupation: Retired  Scientific laboratory technician  . Financial resource strain: Not on file  . Food insecurity    Worry: Not on file    Inability: Not on file  . Transportation needs    Medical: Not on file    Non-medical: Not on file  Tobacco Use  . Smoking status: Former Smoker    Packs/day: 0.50    Years: 25.00    Pack years: 12.50    Types: Cigarettes    Quit date: 04/06/1996    Years since quitting: 22.6  . Smokeless tobacco: Never Used  Substance and Sexual Activity  . Alcohol use: No  . Drug use: No  .  Sexual activity: Not on file  Lifestyle  . Physical activity    Days per week: Not on file    Minutes per session: Not on file  . Stress: Not on file  Relationships  . Social Herbalist on phone: Not on file    Gets together: Not on file    Attends religious service: Not on file    Active member of club or organization: Not on file    Attends meetings of clubs or organizations: Not on file    Relationship status: Not on file  . Intimate partner violence    Fear of current or ex partner: Not on file    Emotionally abused: Not on file    Physically abused: Not on file    Forced sexual activity: Not on file  Other Topics Concern  . Not on file  Social History Narrative   Currently  living with dtr in [place].  No regular exercise.    Past Surgical History:  Procedure Laterality Date  . CARPAL TUNNEL RELEASE Bilateral   . CHOLECYSTECTOMY  2004  . COLONOSCOPY  08/06/2011   Procedure: COLONOSCOPY;  Surgeon: [name], MD;  [facility];  Service: Endoscopy;  Laterality: N/A;  10:40 AM  . CORONARY ANGIOGRAPHY N/A 08/02/2017   Procedure: CORONARY ANGIOGRAPHY;  Surgeon: Lorretta Harp, MD;  Location: [facility];  Service: Cardiovascular;  Laterality: N/A;  . GALLBLADDER SURGERY    . LEFT HEART CATH AND CORONARY ANGIOGRAPHY N/A 06/15/2016   Procedure: Left Heart Cath and Coronary Angiography;  Surgeon: Lorretta Harp, MD;  Location: [facility];  Service: Cardiovascular;  Laterality: N/A;  . PARTIAL HYSTERECTOMY    . repair of belly button    . UMBILICAL HERNIA REPAIR     Umbilical hernia repair as a child    Family History  Problem Relation Age of Onset  . Coronary artery disease Father   . Heart attack Father   . Colon cancer Father   . Coronary artery disease Mother   . Stomach cancer Mother   . Heart disease Brother   . Diabetes Brother   . Pancreatic cancer Neg Hx   . Rectal cancer Neg Hx   . Esophageal cancer Neg Hx     Allergies  Allergen Reactions  . Tylenol [Acetaminophen] Other (See Comments)    Funny feeling in chest.     Current Outpatient Medications on File Prior to Visit  Medication Sig Dispense Refill  . famotidine (PEPCID) 20 MG tablet Take 20 mg by mouth 2 (two) times daily.    Marland Kitchen amLODipine (NORVASC) 5 MG tablet TAKE 1 TABLET BY MOUTH EVERY DAY IN THE EVENING 90 tablet 3  . aspirin EC 81 MG tablet Take 81 mg by mouth every morning.    Marland Kitchen atenolol (TENORMIN) 100 MG tablet Take 1 tablet (100 mg total) by mouth daily. 90 tablet 1  . atorvastatin (LIPITOR) 80 MG tablet TAKE 1 TABLET BY MOUTH EVERY DAY IN THE EVENING FOR CHOLESTEROL 90 tablet 0  . busPIRone (BUSPAR) 7.5 MG tablet TAKE 1 TABLET BY MOUTH TWICE DAILY  FOR ANXIETY. 180 tablet 0  . calcium carbonate (CALCIUM 600) 600 MG TABS tablet Take 1,200 mg by mouth daily.    . cetirizine (ZYRTEC) 10 MG tablet Take 10 mg by mouth daily.    . clopidogrel (PLAVIX) 75 MG tablet Take 1 tablet (75 mg total) by mouth daily. 90 tablet 3  .  isosorbide mononitrate (IMDUR) 120 MG 24 hr tablet Take 1 tablet (120 mg total) by mouth daily. 90 tablet 1  . Melatonin 5 MG TABS Take 5 mg by mouth daily as needed (sleep).    . nitroGLYCERIN (NITROSTAT) 0.4 MG SL tablet PLACE 1 TABLET UNDER THE TONGUE EVERY 5 MIN AS NEEDED FOR CHEST PAIN 25 tablet 11  . pantoprazole (PROTONIX) 40 MG tablet Take 1 tablet by mouth once daily for heartburn. 90 tablet 3  . sertraline (ZOLOFT) 100 MG tablet Take 1 tablet by mouth once daily for depression/anxiety. NEED APPOINTMENT FOR ANY MORE REFILLS 90 tablet 0  . spironolactone (ALDACTONE) 25 MG tablet TAKE 1/2 TABLET BY MOUTH EVERY DAY 45 tablet 2   No current facility-administered medications on file prior to visit.     BP 126/84   Pulse 64   Temp 97.7 F (36.5 C) (Temporal)   Ht 5' (1.524 m)   Wt 144 lb 8 oz (65.5 kg)   SpO2 97%   BMI 28.22 kg/m    Objective:   Physical Exam  Constitutional: She appears well-nourished.  Neck: Neck supple.  Cardiovascular: Normal rate and regular rhythm.  Respiratory: Effort normal and breath sounds normal.  Skin: Skin is warm and dry.  Psychiatric: She has a normal mood and affect.           Assessment & Plan:

## 2018-12-05 NOTE — Assessment & Plan Note (Signed)
Doing well on Zoloft and Buspar, continue same.

## 2018-12-05 NOTE — Assessment & Plan Note (Signed)
Chronic, using Tylenol PRN.  Continue same.

## 2018-12-05 NOTE — Assessment & Plan Note (Signed)
Compliant to clopidogrel, beta blocker statin therapy. BP under good control. Repeat lipids pending. Following with cardiology.

## 2018-12-05 NOTE — Addendum Note (Signed)
Addended by: Jacqualin Combes on: 12/05/2018 11:22 AM   Modules accepted: Orders

## 2018-12-07 DIAGNOSIS — K1379 Other lesions of oral mucosa: Secondary | ICD-10-CM

## 2018-12-07 MED ORDER — TRIAMCINOLONE ACETONIDE 0.1 % MT PSTE: 1.0000 "application " | PASTE | Freq: Two times a day (BID) | OROMUCOSAL | 0 refills | Status: DC

## 2018-12-09 ENCOUNTER — Ambulatory Visit (INDEPENDENT_AMBULATORY_CARE_PROVIDER_SITE_OTHER): Payer: Medicare Other | Admitting: Primary Care

## 2018-12-09 ENCOUNTER — Other Ambulatory Visit: Payer: Self-pay

## 2018-12-09 VITALS — BP 140/82 | HR 70 | Temp 98.0°F | Ht 60.0 in | Wt 141.5 lb

## 2018-12-09 DIAGNOSIS — B029 Zoster without complications: Secondary | ICD-10-CM | POA: Insufficient documentation

## 2018-12-09 MED ORDER — VALACYCLOVIR HCL 1 G PO TABS: 1000.0000 mg | ORAL_TABLET | Freq: Three times a day (TID) | ORAL | 0 refills | Status: DC

## 2018-12-09 MED ORDER — GABAPENTIN 100 MG PO CAPS: ORAL_CAPSULE | ORAL | 0 refills | Status: DC

## 2018-12-09 NOTE — Assessment & Plan Note (Signed)
Classic presentation of herpes zoster affecting the face. No obvious occular involvement, she does not have an eye doctor.  Rx for Valtrex course sent to pharmacy. Given burning discomfort Rx for gabapentin 100 mg 1-3 times daily provided to use. Drowsiness precautions provided.  Urgent referral for ophthalmology evaluation for early next week with strict ED precautions provided for over the weekend.  Daughter will update.

## 2018-12-09 NOTE — Progress Notes (Signed)
Subjective:    Patient ID: Cindy Brandt, female    DOB: 01/21/1932, 85 y.o.   MRN: [id number]  HPI  Cindy Brandt is a 85 year old female with a history of CAD, hypertension, angular cheilitis, prediabetes who presents today with a chief complaint of rash.  She was evaluated in our office on 12/05/18 for her annual exam, had no rash or complaints.   Her daughter sent a message via My Chart on 12/07/18 with reports of left sided tongue sores, she attached a picture. The picture was difficult to see but appears that she may have had aphthous ulcers so triamcinolone paste was prescribed.    Her daughter sent another message via my chart this afternoon with reports of spreading rash to left side of face. She was asked to come in for evaluation.  The rash is located to the left tongue, left cheek, and left jaw. She describes the rash as burning and uncomfortable with blisters. The blisters haven't popped. She denies visual changes, changes in hearing (does wear hearing aids).   Review of Systems  Constitutional: Negative for fever.  Eyes: Negative for visual disturbance.  Skin: Positive for rash.  Neurological: Negative for numbness.       Past Medical History:  Diagnosis Date  . Allergy   . Anemia   . Anxiety   . Arthritis   . Blood transfusion without reported diagnosis   . Cataract    a. bilerteral cataracts removed  . Coronary atherosclerosis of native coronary artery    a. 2003 Cath/unsuccessful PCI of occluded RCA;  b. 2005 NSTEMI/Cath: CTO RCA w/ L->R collats, LAD 20, LCX 81m, nl EF;  c. 11/2011 Myoview: no ischemia.  . Diastolic dysfunction XX123456   a. 11/2011 Echo: Ejection fraction 60-65%, gr1 DD, basal inferior AK;  b. 09/2013 Echo: EF 55-60%, mild LVH, no rwma, Gr1 DD, mildly dil LA.  . Gastritis    a. 04/2016 EGD: Nl esophagus, gastritis, nl duodenal bulb & 2nd portion of the duodenum.  Marland Kitchen Headache(784.0)   . Hyperlipidemia   . Hypertension   . Hypokalemia   . Tubular  adenoma of colon 2013     Social History   Socioeconomic History  . Marital status: Widowed    Spouse name: Not on file  . Number of children: 5  . Years of education: Not on file  . Highest education level: Not on file  Occupational History  . Occupation: Retired  Scientific laboratory technician  . Financial resource strain: Not on file  . Food insecurity    Worry: Not on file    Inability: Not on file  . Transportation needs    Medical: Not on file    Non-medical: Not on file  Tobacco Use  . Smoking status: Former Smoker    Packs/day: 0.50    Years: 25.00    Pack years: 12.50    Types: Cigarettes    Quit date: 04/06/1996    Years since quitting: 22.6  . Smokeless tobacco: Never Used  Substance and Sexual Activity  . Alcohol use: No  . Drug use: No  . Sexual activity: Not on file  Lifestyle  . Physical activity    Days per week: Not on file    Minutes per session: Not on file  . Stress: Not on file  Relationships  . Social Herbalist on phone: Not on file    Gets together: Not on file    Attends religious service:  Not on file    Active member of club or organization: Not on file    Attends meetings of clubs or organizations: Not on file    Relationship status: Not on file  . Intimate partner violence    Fear of current or ex partner: Not on file    Emotionally abused: Not on file    Physically abused: Not on file    Forced sexual activity: Not on file  Other Topics Concern  . Not on file  Social History Narrative   Currently living with dtr in [place].  No regular exercise.    Past Surgical History:  Procedure Laterality Date  . CARPAL TUNNEL RELEASE Bilateral   . CHOLECYSTECTOMY  2004  . COLONOSCOPY  08/06/2011   Procedure: COLONOSCOPY;  Surgeon: [name], MD;  [facility];  Service: Endoscopy;  Laterality: N/A;  10:40 AM  . CORONARY ANGIOGRAPHY N/A 08/02/2017   Procedure: CORONARY ANGIOGRAPHY;  Surgeon: Lorretta Harp, MD;  Location: [facility];  Service: Cardiovascular;  Laterality: N/A;  . GALLBLADDER SURGERY    . LEFT HEART CATH AND CORONARY ANGIOGRAPHY N/A 06/15/2016   Procedure: Left Heart Cath and Coronary Angiography;  Surgeon: Lorretta Harp, MD;  Location: [facility];  Service: Cardiovascular;  Laterality: N/A;  . PARTIAL HYSTERECTOMY    . repair of belly button    . UMBILICAL HERNIA REPAIR     Umbilical hernia repair as a child    Family History  Problem Relation Age of Onset  . Coronary artery disease Father   . Heart attack Father   . Colon cancer Father   . Coronary artery disease Mother   . Stomach cancer Mother   . Heart disease Brother   . Diabetes Brother   . Pancreatic cancer Neg Hx   . Rectal cancer Neg Hx   . Esophageal cancer Neg Hx     Allergies  Allergen Reactions  . Tylenol [Acetaminophen] Other (See Comments)    Funny feeling in chest.     Current Outpatient Medications on File Prior to Visit  Medication Sig Dispense Refill  . amLODipine (NORVASC) 5 MG tablet TAKE 1 TABLET BY MOUTH EVERY DAY IN THE EVENING 90 tablet 3  . aspirin EC 81 MG tablet Take 81 mg by mouth every morning.    Marland Kitchen atenolol (TENORMIN) 100 MG tablet Take 1 tablet (100 mg total) by mouth daily. 90 tablet 1  . atorvastatin (LIPITOR) 80 MG tablet TAKE 1 TABLET BY MOUTH EVERY DAY IN THE EVENING FOR CHOLESTEROL 90 tablet 0  . busPIRone (BUSPAR) 7.5 MG tablet Take 1 tablet by mouth twice daily for anxiety. 180 tablet 3  . calcium carbonate (CALCIUM 600) 600 MG TABS tablet Take 1,200 mg by mouth daily.    . cetirizine (ZYRTEC) 10 MG tablet Take 10 mg by mouth daily.    . clopidogrel (PLAVIX) 75 MG tablet Take 1 tablet (75 mg total) by mouth daily. 90 tablet 3  . famotidine (PEPCID) 20 MG tablet Take 20 mg by mouth 2 (two) times daily.    . isosorbide mononitrate (IMDUR) 120 MG 24 hr tablet Take 1 tablet (120 mg total) by mouth daily. 90 tablet 1  . Melatonin 5 MG TABS Take 5 mg by mouth daily as needed (sleep).     . mirabegron ER (MYRBETRIQ) 25 MG TB24 tablet Take 1 tablet (25 mg total) by mouth daily. For bladder control. 90 tablet 0  .  nitroGLYCERIN (NITROSTAT) 0.4 MG SL tablet PLACE 1 TABLET UNDER THE TONGUE EVERY 5 MIN AS NEEDED FOR CHEST PAIN 25 tablet 11  . pantoprazole (PROTONIX) 40 MG tablet Take 1 tablet by mouth once daily for heartburn. 90 tablet 3  . sertraline (ZOLOFT) 100 MG tablet Take 1 tablet by mouth once daily for depression/anxiety. NEED APPOINTMENT FOR ANY MORE REFILLS 90 tablet 0  . spironolactone (ALDACTONE) 25 MG tablet TAKE 1/2 TABLET BY MOUTH EVERY DAY 45 tablet 2  . triamcinolone (KENALOG) 0.1 % paste Use as directed 1 application in the mouth or throat 2 (two) times daily. 5 g 0   No current facility-administered medications on file prior to visit.     BP 140/82   Pulse 70   Temp 98 F (36.7 C) (Temporal)   Ht 5' (1.524 m)   Wt 141 lb 8 oz (64.2 kg)   SpO2 97%   BMI 27.63 kg/m    Objective:   Physical Exam  Constitutional: She is oriented to person, place, and time. She appears well-nourished.  Neurological: She is alert and oriented to person, place, and time.  Skin: Skin is dry. Rash noted.  Vesicle type rash to left tongue, cheek, jaw, and canal of left ear. Vesicles intact.           Assessment & Plan:

## 2018-12-09 NOTE — Patient Instructions (Signed)
Stop by the front desk and speak with either Rosanne Gutting regarding your referral to the eye doctor.  Start valacyclovir tablets for shingles. Take 1 tablet by mouth three times daily for 7 days.  You may take the gabapentin 100 mg capsules 1-3 times daily for nerve pain. This may cause drowsiness.  It was a pleasure to see you today!

## 2018-12-10 DIAGNOSIS — B0233 Zoster keratitis: Secondary | ICD-10-CM | POA: Diagnosis not present

## 2018-12-13 DIAGNOSIS — H26493 Other secondary cataract, bilateral: Secondary | ICD-10-CM | POA: Diagnosis not present

## 2018-12-13 DIAGNOSIS — H0014 Chalazion left upper eyelid: Secondary | ICD-10-CM | POA: Diagnosis not present

## 2018-12-13 DIAGNOSIS — B029 Zoster without complications: Secondary | ICD-10-CM | POA: Diagnosis not present

## 2018-12-15 ENCOUNTER — Other Ambulatory Visit: Payer: Self-pay

## 2018-12-15 MED ORDER — SPIRONOLACTONE 25 MG PO TABS: 12.5000 mg | ORAL_TABLET | Freq: Every day | ORAL | 2 refills | Status: DC

## 2019-01-16 DIAGNOSIS — B029 Zoster without complications: Secondary | ICD-10-CM | POA: Diagnosis not present

## 2019-02-14 ENCOUNTER — Other Ambulatory Visit: Payer: Self-pay | Admitting: Primary Care

## 2019-02-14 DIAGNOSIS — R32 Unspecified urinary incontinence: Secondary | ICD-10-CM

## 2019-02-14 DIAGNOSIS — F411 Generalized anxiety disorder: Secondary | ICD-10-CM

## 2019-02-15 ENCOUNTER — Other Ambulatory Visit: Payer: Self-pay | Admitting: Cardiovascular Disease

## 2019-02-15 DIAGNOSIS — I10 Essential (primary) hypertension: Secondary | ICD-10-CM

## 2019-02-16 ENCOUNTER — Other Ambulatory Visit: Payer: Self-pay | Admitting: Primary Care

## 2019-02-16 DIAGNOSIS — E785 Hyperlipidemia, unspecified: Secondary | ICD-10-CM

## 2019-03-16 ENCOUNTER — Other Ambulatory Visit: Payer: Self-pay | Admitting: Cardiology

## 2019-04-19 ENCOUNTER — Ambulatory Visit: Payer: Medicare Other | Attending: Internal Medicine

## 2019-04-19 DIAGNOSIS — Z23 Encounter for immunization: Secondary | ICD-10-CM

## 2019-04-19 NOTE — Progress Notes (Signed)
   Covid-19 Vaccination Clinic  Name:  Cindy Brandt    MRN: [id number] DOB: 1931/07/29  04/19/2019  Cindy Brandt was observed post Covid-19 immunization for 15 minutes without incidence. She was provided with Vaccine Information Sheet and instruction to access the V-Safe system.   Cindy Brandt was instructed to call 911 with any severe reactions post vaccine: Marland Kitchen Difficulty breathing  . Swelling of your face and throat  . A fast heartbeat  . A bad rash all over your body  . Dizziness and weakness    Immunizations Administered    Name Date Dose VIS Date Route   Moderna COVID-19 Vaccine 04/19/2019 10:33 AM 0.5 mL 01/31/2019 Intramuscular   Manufacturer: Moderna   Lot: [id number]   [id number]

## 2019-05-17 ENCOUNTER — Ambulatory Visit: Payer: Medicare Other | Attending: Internal Medicine

## 2019-05-17 DIAGNOSIS — Z23 Encounter for immunization: Secondary | ICD-10-CM

## 2019-05-17 NOTE — Progress Notes (Signed)
   Covid-19 Vaccination Clinic  Name:  Cindy Brandt    MRN: [id number] DOB: [birth year]  05/17/2019  [name] was observed post Covid-19 immunization for 15 minutes without incident. She was provided with Vaccine Information Sheet and instruction to access the V-Safe system.   [name] was instructed to call 911 with any severe reactions post vaccine: Marland Kitchen Difficulty breathing  . Swelling of face and throat  . A fast heartbeat  . A bad rash all over body  . Dizziness and weakness   Immunizations Administered    Name Date Dose VIS Date Route   Moderna COVID-19 Vaccine 05/17/2019 10:38 AM 0.5 mL 01/31/2019 Intramuscular   Manufacturer: Moderna   Lot: [id number]   [id number]

## 2019-05-21 NOTE — Progress Notes (Signed)
Cardiology Office Note   Date:  05/22/2019   ID:  Cindy Brandt, DOB [birth year], MRN [id number]  PCP:  [name], NP  Cardiologist:  Dr. Gwenlyn Found  CC: Fatigue and chest pain   History of Present Illness: Cindy Brandt is a 86 y.o. female who presents for ongoing assessment and management of CAD,she has a h/o CAD (CTO of RCA &nonobs LAD/LCX dzs - 2005), HTN, HL.   She 2018 she had a cardiac catheterization on her 06/15/16 revealed an occluded RCA with left right collaterals, occluded nondominant circumflex, high-grade ostial ramus branch stenosis and 50% proximal LAD stenosis. She had normal LV  She had a re-look cath in 2019 08/02/2017 revealed unchanged anatomy and medical therapy was recommended.  She comes today with her daughter with multiple complaints.  She continues to have chest pain, mild dyspnea on exertion, she falls asleep easily during the day and has no energy, does not sleep well at night, and has some dizziness and nausea when she gets out of bed in the morning.  Past Medical History:  Diagnosis Date  . Allergy   . Anemia   . Anxiety   . Arthritis   . Blood transfusion without reported diagnosis   . Cataract    a. bilerteral cataracts removed  . Coronary atherosclerosis of native coronary artery    a. 2003 Cath/unsuccessful PCI of occluded RCA;  b. 2005 NSTEMI/Cath: CTO RCA w/ L->R collats, LAD 20, LCX 58m, nl EF;  c. 11/2011 Myoview: no ischemia.  . Diastolic dysfunction [id number]   a. 11/2011 Echo: Ejection fraction 60-65%, gr1 DD, basal inferior AK;  b. 09/2013 Echo: EF 55-60%, mild LVH, no rwma, Gr1 DD, mildly dil LA.  . Gastritis    a. 04/2016 EGD: Nl esophagus, gastritis, nl duodenal bulb & 2nd portion of the duodenum.  Marland Kitchen Headache(784.0)   . Hyperlipidemia   . Hypertension   . Hypokalemia   . Tubular adenoma of colon 2013    Past Surgical History:  Procedure Laterality Date  . CARPAL TUNNEL RELEASE Bilateral   . CHOLECYSTECTOMY  2004  . COLONOSCOPY   08/06/2011   Procedure: COLONOSCOPY;  Surgeon: [name], MD;  [facility];  Service: Endoscopy;  Laterality: N/A;  10:40 AM  . CORONARY ANGIOGRAPHY N/A 08/02/2017   Procedure: CORONARY ANGIOGRAPHY;  Surgeon: Lorretta Harp, MD;  Location: [place] [facility];  Service: Cardiovascular;  Laterality: N/A;  . GALLBLADDER SURGERY    . LEFT HEART CATH AND CORONARY ANGIOGRAPHY N/A 06/15/2016   Procedure: Left Heart Cath and Coronary Angiography;  Surgeon: Lorretta Harp, MD;  Location: [place] [facility];  Service: Cardiovascular;  Laterality: N/A;  . PARTIAL HYSTERECTOMY    . repair of belly button    . UMBILICAL HERNIA REPAIR     Umbilical hernia repair as a child     Current Outpatient Medications  Medication Sig Dispense Refill  . amLODipine (NORVASC) 2.5 MG tablet Take 1 tablet (2.5 mg total) by mouth daily. 90 tablet 1  . aspirin EC 81 MG tablet Take 81 mg by mouth every morning.    Marland Kitchen atenolol (TENORMIN) 50 MG tablet Take 1 tablet (50 mg total) by mouth 2 (two) times daily. 180 tablet 2  . atorvastatin (LIPITOR) 80 MG tablet TAKE 1 TABLET BY MOUTH EVERY DAY IN THE EVENING FOR CHOLESTEROL 90 tablet 2  . busPIRone (BUSPAR) 7.5 MG tablet Take 1 tablet by mouth twice daily for anxiety. 180 tablet  3  . calcium carbonate (CALCIUM 600) 600 MG TABS tablet Take 1,200 mg by mouth daily.    . cetirizine (ZYRTEC) 10 MG tablet Take 10 mg by mouth daily.    . clopidogrel (PLAVIX) 75 MG tablet Take 1 tablet (75 mg total) by mouth daily. 90 tablet 3  . famotidine (PEPCID) 20 MG tablet Take 20 mg by mouth 2 (two) times daily.    Marland Kitchen gabapentin (NEURONTIN) 100 MG capsule Take 1 capsule up to three times daily for nerve pain. Start at bedtime only. 30 capsule 0  . isosorbide mononitrate (IMDUR) 120 MG 24 hr tablet Take 1 tablet (120 mg total) by mouth daily. 90 tablet 0  . Melatonin 5 MG TABS Take 5 mg by mouth daily as needed (sleep).    . mirabegron ER (MYRBETRIQ) 25 MG TB24 tablet Take 1  tablet (25 mg total) by mouth daily. For bladder control. 90 tablet 0  . nitroGLYCERIN (NITROSTAT) 0.4 MG SL tablet PLACE 1 TABLET UNDER THE TONGUE EVERY 5 MIN AS NEEDED FOR CHEST PAIN 25 tablet 11  . pantoprazole (PROTONIX) 40 MG tablet Take 1 tablet by mouth once daily for heartburn. 90 tablet 3  . sertraline (ZOLOFT) 100 MG tablet Take 1 tablet by mouth once daily for depression/anxiety. 90 tablet 1  . spironolactone (ALDACTONE) 25 MG tablet Take 0.5 tablets (12.5 mg total) by mouth daily. 45 tablet 2  . triamcinolone (KENALOG) 0.1 % paste Use as directed 1 application in the mouth or throat 2 (two) times daily. 5 g 0  . valACYclovir (VALTREX) 1000 MG tablet Take 1 tablet (1,000 mg total) by mouth 3 (three) times daily. 21 tablet 0   No current facility-administered medications for this visit.    Allergies:   Tylenol [acetaminophen]    Social History:  The patient  reports that she quit smoking about 23 years ago. Her smoking use included cigarettes. She has a 12.50 pack-year smoking history. She has never used smokeless tobacco. She reports that she does not drink alcohol or use drugs.   Family History:  The patient's family history includes Colon cancer in her father; Coronary artery disease in her father and mother; Diabetes in her brother; Heart attack in her father; Heart disease in her brother; Stomach cancer in her mother.    ROS: All other systems are reviewed and negative. Unless otherwise mentioned in H&P    PHYSICAL EXAM: VS:  BP (!) 158/85   Pulse (!) 55   Ht 5' (1.524 m)   Wt 143 lb 9.6 oz (65.1 kg)   BMI 28.04 kg/m  , BMI Body mass index is 28.04 kg/m. GEN: Well nourished, well developed, in no acute distress HEENT: normal Neck: no JVD, carotid bruits, or masses Cardiac: RRR; 1/6 systolic murmurs, rubs, or gallops,no edema  Respiratory:  Clear to auscultation bilaterally, normal work of breathing GI: soft, nontender, nondistended, + BS MS: no deformity or  atrophy Skin: warm and dry, no rash Neuro:  Strength and sensation are intact Psych: euthymic mood, full affect   EKG: (Personally reviewed) sinus bradycardia heart rate of 55 bpm with right bundle branch block  Recent Labs: 12/05/2018: ALT 15; BUN 15; Creatinine, Ser 0.86; Potassium 4.4; Sodium 139    Lipid Panel    Component Value Date/Time   CHOL 118 12/05/2018 0948   TRIG 64.0 12/05/2018 0948   HDL 54.10 12/05/2018 0948   CHOLHDL 2 12/05/2018 0948   VLDL 12.8 12/05/2018 0948   LDLCALC 51 12/05/2018  [id number]      Wt Readings from Last 3 Encounters:  05/22/19 143 lb 9.6 oz (65.1 kg)  12/09/18 141 lb 8 oz (64.2 kg)  12/05/18 144 lb 8 oz (65.5 kg)      Other studies Reviewed: LHC 08/02/2017  Ost LM lesion is 45% stenosed.  Prox LAD to Mid LAD lesion is 50% stenosed.  Ost Ramus lesion is 95% stenosed.  Ost Cx to Prox Cx lesion is 100% stenosed.  Mid RCA lesion is 100% stenosed.   ASSESSMENT AND PLAN:  1.  Chronic chest pain: This is baseline for her.  I have explained to her that she may have some chronic angina pain due to her age as well as coronary artery disease.  May consider increasing isosorbide or adding Ranexa on next office visit.  2.  Dizziness and fatigue: She may be on too high dose of her medications.  We did do orthostatic blood pressures with blood pressures running between 118/78-130 6/70 with heart rate remaining in the 59-60 range.  As a result of her age, symptoms of dizziness, with frailty, I have decided to decrease her amlodipine to 2.5 mg from 5 mg to allow her to have permissive hypertension, and also decrease her atenolol to 50 mg twice daily due to bradycardia and fatigue.  I will see her again in 1 month.  She is to record her blood pressure and heart rate each day at the same time every day, I have asked her to be seated for 5 minutes with both feet on the floor prior to taking her blood pressure.  She and her daughter verbalized  understanding.  3.  Coronary artery disease: Most recent cardiac catheterization in 2019, revealed three-vessel disease.  She is treated medically at this time.  4.  Hyperlipidemia: Continue atorvastatin as directed at high dose 80 mg tablet.  We will need to repeat fasting lipids and LFTs on next follow-up visit.  Current medicines are reviewed at length with the patient today.  I have spent 40 dedicated to the care of this patient on the date of this encounter to include pre-visit review of records, assessment, management and diagnostic testing,with shared decision making.  Labs/ tests ordered today include: BMET, magnesium, and CBC Cindy Brandt. Cindy Brandt, ANP, AACC   05/22/2019 12:52 PM    [facility] [place] [address] Office [phone] Fax [phone]  Notice: This dictation was prepared with Dragon dictation along with smaller phrase technology. Any transcriptional errors that result from this process are unintentional and may not be corrected upon review.

## 2019-05-22 ENCOUNTER — Other Ambulatory Visit: Payer: Self-pay

## 2019-05-22 ENCOUNTER — Ambulatory Visit: Payer: Medicare Other | Admitting: Adult Health

## 2019-05-22 ENCOUNTER — Encounter: Payer: Self-pay | Admitting: Adult Health

## 2019-05-22 VITALS — BP 158/85 | HR 55 | Ht 60.0 in | Wt 143.6 lb

## 2019-05-22 DIAGNOSIS — R072 Precordial pain: Secondary | ICD-10-CM

## 2019-05-22 DIAGNOSIS — E785 Hyperlipidemia, unspecified: Secondary | ICD-10-CM | POA: Diagnosis not present

## 2019-05-22 DIAGNOSIS — I25118 Atherosclerotic heart disease of native coronary artery with other forms of angina pectoris: Secondary | ICD-10-CM

## 2019-05-22 DIAGNOSIS — Z79899 Other long term (current) drug therapy: Secondary | ICD-10-CM

## 2019-05-22 DIAGNOSIS — I10 Essential (primary) hypertension: Secondary | ICD-10-CM

## 2019-05-22 LAB — BASIC METABOLIC PANEL
BUN/Creatinine Ratio: 21 (ref 12–28)
BUN: 20 mg/dL (ref 8–27)
CO2: 26 mmol/L (ref 20–29)
Calcium: 10.2 mg/dL (ref 8.7–10.3)
Chloride: 100 mmol/L (ref 96–106)
Creatinine, Ser: 0.97 mg/dL (ref 0.57–1.00)
GFR calc Af Amer: 62 mL/min/{1.73_m2} (ref >59)
GFR calc non Af Amer: 53 mL/min/{1.73_m2} — ABNORMAL LOW (ref >59)
Glucose: 124 mg/dL — ABNORMAL HIGH (ref 65–99)
Potassium: 4.1 mmol/L (ref 3.5–5.2)
Sodium: 139 mmol/L (ref 134–144)

## 2019-05-22 LAB — MAGNESIUM: Magnesium: 1.6 mg/dL (ref 1.6–2.3)

## 2019-05-22 LAB — CBC
Hematocrit: 37.6 % (ref 34.0–46.6)
Hemoglobin: 12.6 g/dL (ref 11.1–15.9)
MCH: 35.1 pg — ABNORMAL HIGH (ref 26.6–33.0)
MCHC: 33.5 g/dL (ref 31.5–35.7)
MCV: 105 fL — ABNORMAL HIGH (ref 79–97)
Platelets: 207 10*3/uL (ref 150–450)
RBC: 3.59 x10E6/uL — ABNORMAL LOW (ref 3.77–5.28)
RDW: 11.3 % — ABNORMAL LOW (ref 11.7–15.4)
WBC: 4.6 10*3/uL (ref 3.4–10.8)

## 2019-05-22 MED ORDER — ATENOLOL 50 MG PO TABS: 50.0000 mg | ORAL_TABLET | Freq: Two times a day (BID) | ORAL | 2 refills | Status: DC

## 2019-05-22 MED ORDER — AMLODIPINE BESYLATE 2.5 MG PO TABS: 2.5000 mg | ORAL_TABLET | Freq: Every day | ORAL | 1 refills | Status: DC

## 2019-05-22 NOTE — Patient Instructions (Signed)
Medication Instructions:  DECREASE- Amlodipine 2.5 mg by mouth daily DECREASE- Atenolol 50 mg by mouth twice a day  *If you need a refill on your cardiac medications before your next appointment, please call your pharmacy*   Lab Work: BMP, CBC and Magnesium  If you have labs (blood work) drawn today and your tests are completely normal, you will receive your results only by: Marland Kitchen MyChart Message (if you have MyChart) OR . A paper copy in the mail If you have any lab test that is abnormal or we need to change your treatment, we will call you to review the results.   Testing/Procedures: None Ordered   Follow-Up: At [facility], you and your health needs are our priority.  As part of our continuing mission to provide you with exceptional heart care, we have created designated Provider Care Teams.  These Care Teams include your primary Cardiologist (physician) and Advanced Practice Providers (APPs -  Physician Assistants and Nurse Practitioners) who all work together to provide you with the care you need, when you need it.  We recommend signing up for the patient portal called "MyChart".  Sign up information is provided on this After Visit Summary.  MyChart is used to connect with patients for Virtual Visits (Telemedicine).  Patients are able to view lab/test results, encounter notes, upcoming appointments, etc.  Non-urgent messages can be sent to your provider as well.   To learn more about what you can do with MyChart, go to [email or web address].    Your next appointment:   1 month(s)  The format for your next appointment:   In Person  Provider:   Kerin Ransom, PA-C

## 2019-05-25 ENCOUNTER — Other Ambulatory Visit (INDEPENDENT_AMBULATORY_CARE_PROVIDER_SITE_OTHER): Payer: Medicare Other

## 2019-05-25 DIAGNOSIS — I10 Essential (primary) hypertension: Secondary | ICD-10-CM

## 2019-05-30 ENCOUNTER — Telehealth: Payer: Self-pay | Admitting: *Deleted

## 2019-05-30 MED ORDER — MAGNESIUM 200 MG PO TABS: 200.0000 mg | ORAL_TABLET | Freq: Every day | ORAL | 11 refills | Status: DC

## 2019-05-30 NOTE — Telephone Encounter (Signed)
Pt aware of her blood work Magnesium 200 mg was send into pt pharmacy, pt made aware

## 2019-05-30 NOTE — Telephone Encounter (Signed)
-----   Message from Lendon Colonel, NP sent at 05/23/2019  7:57 AM EDT ----- Please begin magnesium 200 mg daily. Would like her magnesium to be at least 2.0.    Thanks

## 2019-06-22 ENCOUNTER — Other Ambulatory Visit: Payer: Self-pay

## 2019-06-22 ENCOUNTER — Ambulatory Visit: Payer: Medicare Other | Admitting: Cardiology

## 2019-06-22 ENCOUNTER — Encounter: Payer: Self-pay | Admitting: Cardiology

## 2019-06-22 VITALS — BP 112/62 | HR 64 | Ht 60.0 in | Wt 145.0 lb

## 2019-06-22 DIAGNOSIS — R079 Chest pain, unspecified: Secondary | ICD-10-CM

## 2019-06-22 DIAGNOSIS — Z9861 Coronary angioplasty status: Secondary | ICD-10-CM

## 2019-06-22 DIAGNOSIS — I10 Essential (primary) hypertension: Secondary | ICD-10-CM

## 2019-06-22 DIAGNOSIS — I2510 Atherosclerotic heart disease of native coronary artery without angina pectoris: Secondary | ICD-10-CM | POA: Diagnosis not present

## 2019-06-22 MED ORDER — RANOLAZINE ER 500 MG PO TB12: 500.0000 mg | ORAL_TABLET | Freq: Two times a day (BID) | ORAL | 2 refills | Status: DC

## 2019-06-22 NOTE — Assessment & Plan Note (Signed)
RCA PCI in 2005. Cath April 2018- occluded RCA, occluded non dominant CFX, high grade RI, 50% LAD with L-R collaterals Normal LVF. Plan is medical Rx Re look cath 08/02/17 for chest pain- no real change except CFX now occluded, LAD unchanged-50%- continue medical rx

## 2019-06-22 NOTE — Assessment & Plan Note (Signed)
She may very well be having angina.  She is already beta blocked, on Imdur 120 mg, and on low dose Ca++ blocker. I have suggested addition of Ranexa.  She is not very active at home and does not appear to be a great candidate for cath/PCI.

## 2019-06-22 NOTE — Progress Notes (Signed)
Cardiology Office Note:    Date:  06/22/2019   ID:  Cindy Brandt, DOB 1931/09/16, MRN [id number]  PCP:  [name], NP  Cardiologist:  Quay Burow, MD  Electrophysiologist:  None   Referring MD: [name], NP   CC:  Chest pain  History of Present Illness:    Cindy Brandt is a pleasant 86 y.o. female with a hx of CAD,s/p RCA PCI in 2005, Cath in April 2018revealed an occluded RCA with left right collaterals, occluded nondominant circumflex, high-grade ostial ramus branch stenosis and 50% proximal LAD stenosis. She had normal LV function with grade 2 diastolic dysfunction.Medical therapy was recommended.  In May 2019 the pt's daughter reported the patient was using a lot of SL NTG.  She was admitted for cath 08/02/17. This revealed the RI to be occluded but no change in the 50% LAD which supplied collaterals to the RCA. The plan is for continued medical Rx.   In July 2020 she was seen and had complained if fatigue with exertion and her beta blocker was decreased.  She seemed to be improved at f/u in Sept 2020.  She was seen by Jory Sims 05/22/2019 again complaining of fatigue and chest pain.  Her Amlodipine and Atenolol were cut back thinking hypotension and bradycardia may be contributing.   She is in the office today for follow up.  She complains of mid sternal chest pain.  She does occasionally take SL NTG with relief. She tells me she thinks her symptoms are increasing in frequency.    Past Medical History:  Diagnosis Date  . Allergy   . Anemia   . Anxiety   . Arthritis   . Blood transfusion without reported diagnosis   . Cataract    a. bilerteral cataracts removed  . Coronary atherosclerosis of native coronary artery    a. 2003 Cath/unsuccessful PCI of occluded RCA;  b. 2005 NSTEMI/Cath: CTO RCA w/ L->R collats, LAD 20, LCX 33m, nl EF;  c. 11/2011 Myoview: no ischemia.  . Diastolic dysfunction [id number]   a. 11/2011 Echo: Ejection fraction 60-65%, gr1  DD, basal inferior AK;  b. 09/2013 Echo: EF 55-60%, mild LVH, no rwma, Gr1 DD, mildly dil LA.  . Gastritis    a. 04/2016 EGD: Nl esophagus, gastritis, nl duodenal bulb & 2nd portion of the duodenum.  Marland Kitchen Headache(784.0)   . Hyperlipidemia   . Hypertension   . Hypokalemia   . Tubular adenoma of colon 2013    Past Surgical History:  Procedure Laterality Date  . CARPAL TUNNEL RELEASE Bilateral   . CHOLECYSTECTOMY  2004  . COLONOSCOPY  08/06/2011   Procedure: COLONOSCOPY;  Surgeon: [name], MD;  [facility];  Service: Endoscopy;  Laterality: N/A;  10:40 AM  . CORONARY ANGIOGRAPHY N/A 08/02/2017   Procedure: CORONARY ANGIOGRAPHY;  Surgeon: Lorretta Harp, MD;  Location: [place] [facility];  Service: Cardiovascular;  Laterality: N/A;  . GALLBLADDER SURGERY    . LEFT HEART CATH AND CORONARY ANGIOGRAPHY N/A 06/15/2016   Procedure: Left Heart Cath and Coronary Angiography;  Surgeon: Lorretta Harp, MD;  Location: [place] [facility];  Service: Cardiovascular;  Laterality: N/A;  . PARTIAL HYSTERECTOMY    . repair of belly button    . UMBILICAL HERNIA REPAIR     Umbilical hernia repair as a child    Current Medications: Current Meds  Medication Sig  . amLODipine (NORVASC) 2.5 MG tablet Take 1 tablet (2.5 mg  total) by mouth daily.  Marland Kitchen aspirin EC 81 MG tablet Take 81 mg by mouth every morning.  Marland Kitchen atenolol (TENORMIN) 50 MG tablet Take 1 tablet (50 mg total) by mouth 2 (two) times daily.  Marland Kitchen atorvastatin (LIPITOR) 80 MG tablet TAKE 1 TABLET BY MOUTH EVERY DAY IN THE EVENING FOR CHOLESTEROL  . busPIRone (BUSPAR) 7.5 MG tablet Take 1 tablet by mouth twice daily for anxiety.  . calcium carbonate (CALCIUM 600) 600 MG TABS tablet Take 1,200 mg by mouth daily.  . cetirizine (ZYRTEC) 10 MG tablet Take 10 mg by mouth daily.  . clopidogrel (PLAVIX) 75 MG tablet Take 1 tablet (75 mg total) by mouth daily.  . famotidine (PEPCID) 20 MG tablet Take 20 mg by mouth 2 (two) times daily.  Marland Kitchen  gabapentin (NEURONTIN) 100 MG capsule Take 1 capsule up to three times daily for nerve pain. Start at bedtime only.  . isosorbide mononitrate (IMDUR) 120 MG 24 hr tablet Take 1 tablet (120 mg total) by mouth daily.  . Magnesium 200 MG TABS Take 1 tablet (200 mg total) by mouth daily.  . Melatonin 5 MG TABS Take 5 mg by mouth daily as needed (sleep).  . mirabegron ER (MYRBETRIQ) 25 MG TB24 tablet Take 1 tablet (25 mg total) by mouth daily. For bladder control.  . nitroGLYCERIN (NITROSTAT) 0.4 MG SL tablet PLACE 1 TABLET UNDER THE TONGUE EVERY 5 MIN AS NEEDED FOR CHEST PAIN  . pantoprazole (PROTONIX) 40 MG tablet Take 1 tablet by mouth once daily for heartburn.  . sertraline (ZOLOFT) 100 MG tablet Take 1 tablet by mouth once daily for depression/anxiety.  Marland Kitchen spironolactone (ALDACTONE) 25 MG tablet Take 0.5 tablets (12.5 mg total) by mouth daily.  Marland Kitchen triamcinolone (KENALOG) 0.1 % paste Use as directed 1 application in the mouth or throat 2 (two) times daily.  . valACYclovir (VALTREX) 1000 MG tablet Take 1 tablet (1,000 mg total) by mouth 3 (three) times daily.     Allergies:   Tylenol [acetaminophen]   Social History   Socioeconomic History  . Marital status: Widowed    Spouse name: Not on file  . Number of children: 5  . Years of education: Not on file  . Highest education level: Not on file  Occupational History  . Occupation: Retired  Tobacco Use  . Smoking status: Former Smoker    Packs/day: 0.50    Years: 25.00    Pack years: 12.50    Types: Cigarettes    Quit date: 04/06/1996    Years since quitting: 23.2  . Smokeless tobacco: Never Used  Substance and Sexual Activity  . Alcohol use: No  . Drug use: No  . Sexual activity: Not on file  Other Topics Concern  . Not on file  Social History Narrative   Currently living with dtr in [place].  No regular exercise.   Social Determinants of Health   Financial Resource Strain:   . Difficulty of Paying Living Expenses:   Food  Insecurity:   . Worried About Charity fundraiser in the Last Year:   . Arboriculturist in the Last Year:   Transportation Needs:   . Film/video editor (Medical):   Marland Kitchen Lack of Transportation (Non-Medical):   Physical Activity:   . Days of Exercise per Week:   . Minutes of Exercise per Session:   Stress:   . Feeling of Stress :   Social Connections:   . Frequency of Communication with Friends and  Family:   . Frequency of Social Gatherings with Friends and Family:   . Attends Religious Services:   . Active Member of Clubs or Organizations:   . Attends Archivist Meetings:   Marland Kitchen Marital Status:      Family History: The patient's family history includes Colon cancer in her father; Coronary artery disease in her father and mother; Diabetes in her brother; Heart attack in her father; Heart disease in her brother; Stomach cancer in her mother. There is no history of Pancreatic cancer, Rectal cancer, or Esophageal cancer.  ROS:   Please see the history of present illness.   C/O Rt groin pain   All other systems reviewed and are negative.  EKGs/Labs/Other Studies Reviewed:    The following studies were reviewed today: Cath 2018  EKG:  EKG is not ordered today.  The ekg ordered 05/19/2019 demonstrates NSR- SB-HR 55, incomplete RBBB  Recent Labs: 12/05/2018: ALT 15 05/22/2019: BUN 20; Creatinine, Ser 0.97; Hemoglobin 12.6; Magnesium 1.6; Platelets 207; Potassium 4.1; Sodium 139  Recent Lipid Panel    Component Value Date/Time   CHOL 118 12/05/2018 0948   TRIG 64.0 12/05/2018 0948   HDL 54.10 12/05/2018 0948   CHOLHDL 2 12/05/2018 0948   VLDL 12.8 12/05/2018 0948   LDLCALC 51 12/05/2018 0948    Physical Exam:    VS:  BP 112/62   Pulse 64   Ht 5' (1.524 m)   Wt 145 lb (65.8 kg)   SpO2 91%   BMI 28.32 kg/m     Wt Readings from Last 3 Encounters:  06/22/19 145 lb (65.8 kg)  05/22/19 143 lb 9.6 oz (65.1 kg)  12/09/18 141 lb 8 oz (64.2 kg)     GEN: Elderly AA  female presents to the office in a wheel chair accompanied by her daughter, well developed in no acute distress HEENT: Normal NECK: No JVD;  CARDIAC: RRR, no murmurs, rubs, gallops RESPIRATORY:  Clear to auscultation without rales, wheezing or rhonchi  ABDOMEN: Soft, non-tender, non-distended-no obvious findings Rt groin- no hematoma, no bruit MUSCULOSKELETAL:  No edema; No deformity  SKIN: Warm and dry NEUROLOGIC:  Alert and oriented x 3 PSYCHIATRIC:  Normal affect   ASSESSMENT:    Chest pain with moderate risk for cardiac etiology She may very well be having angina.  She is already beta blocked, on Imdur 120 mg, and on low dose Ca++ blocker. I have suggested addition of Ranexa.  She is not very active at home and does not appear to be a great candidate for cath/PCI.   Essential hypertension B/P controlled on current medications  CAD S/P percutaneous coronary angioplasty RCA PCI in 2005. Cath April 2018- occluded RCA, occluded non dominant CFX, high grade RI, 50% LAD with L-R collaterals Normal LVF. Plan is medical Rx Re look cath 08/02/17 for chest pain- no real change except CFX now occluded, LAD unchanged-50%- continue medical rx   PLAN:    Add Ranexa 500 mg BID- f/u in two weeks   Medication Adjustments/Labs and Tests Ordered: Current medicines are reviewed at length with the patient today.  Concerns regarding medicines are outlined above.  No orders of the defined types were placed in this encounter.  Meds ordered this encounter  Medications  . ranolazine (RANEXA) 500 MG 12 hr tablet    Sig: Take 1 tablet (500 mg total) by mouth 2 (two) times daily.    Dispense:  180 tablet    Refill:  2  Patient Instructions  Medication Instructions:  START- Ranexa 500 mg by mouth twice a day  *If you need a refill on your cardiac medications before your next appointment, please call your pharmacy*   Lab Work: None Ordered   Testing/Procedures: None  Ordered   Follow-Up: At [facility], you and your health needs are our priority.  As part of our continuing mission to provide you with exceptional heart care, we have created designated Provider Care Teams.  These Care Teams include your primary Cardiologist (physician) and Advanced Practice Providers (APPs -  Physician Assistants and Nurse Practitioners) who all work together to provide you with the care you need, when you need it.  We recommend signing up for the patient portal called "MyChart".  Sign up information is provided on this After Visit Summary.  MyChart is used to connect with patients for Virtual Visits (Telemedicine).  Patients are able to view lab/test results, encounter notes, upcoming appointments, etc.  Non-urgent messages can be sent to your provider as well.   To learn more about what you can do with MyChart, go to [email or web address].    Your next appointment:   Tuesday May 4th @ 1:45 pm  The format for your next appointment:   Virtual Visit   Provider:   Kerin Ransom, PA-C       Signed, Kerin Ransom, [place]  06/22/2019 2:49 PM    [place]

## 2019-06-22 NOTE — Patient Instructions (Signed)
Medication Instructions:  START- Ranexa 500 mg by mouth twice a day  *If you need a refill on your cardiac medications before your next appointment, please call your pharmacy*   Lab Work: None Ordered   Testing/Procedures: None Ordered   Follow-Up: At [facility], you and your health needs are our priority.  As part of our continuing mission to provide you with exceptional heart care, we have created designated Provider Care Teams.  These Care Teams include your primary Cardiologist (physician) and Advanced Practice Providers (APPs -  Physician Assistants and Nurse Practitioners) who all work together to provide you with the care you need, when you need it.  We recommend signing up for the patient portal called "MyChart".  Sign up information is provided on this After Visit Summary.  MyChart is used to connect with patients for Virtual Visits (Telemedicine).  Patients are able to view lab/test results, encounter notes, upcoming appointments, etc.  Non-urgent messages can be sent to your provider as well.   To learn more about what you can do with MyChart, go to [email or web address].    Your next appointment:   Tuesday May 4th @ 1:45 pm  The format for your next appointment:   Virtual Visit   Provider:   Kerin Ransom, PA-C

## 2019-06-22 NOTE — Assessment & Plan Note (Signed)
BP controlled on current medications.  

## 2019-06-26 ENCOUNTER — Encounter: Payer: Self-pay | Admitting: Primary Care

## 2019-06-26 ENCOUNTER — Ambulatory Visit (INDEPENDENT_AMBULATORY_CARE_PROVIDER_SITE_OTHER): Payer: Medicare Other | Admitting: Primary Care

## 2019-06-26 ENCOUNTER — Other Ambulatory Visit: Payer: Self-pay

## 2019-06-26 VITALS — BP 134/80 | HR 68 | Temp 97.4°F | Ht 60.0 in | Wt 143.5 lb

## 2019-06-26 DIAGNOSIS — R42 Dizziness and giddiness: Secondary | ICD-10-CM | POA: Diagnosis not present

## 2019-06-26 DIAGNOSIS — M79604 Pain in right leg: Secondary | ICD-10-CM

## 2019-06-26 DIAGNOSIS — K219 Gastro-esophageal reflux disease without esophagitis: Secondary | ICD-10-CM

## 2019-06-26 MED ORDER — GABAPENTIN 100 MG PO CAPS: ORAL_CAPSULE | ORAL | 0 refills | Status: DC

## 2019-06-26 NOTE — Assessment & Plan Note (Signed)
With right groin pain. Unclear cause, difficult to really know given her lack of inactivity.  Doubt hernia given sedentary lifestyle. Consider nerve impingement from recurrent sitting. Given pain coupled with dizziness and sedentary activity we will add home health physical therapy for evaluation.    Refill provided for gabapentin to use PRN, drowsiness precautions provided.

## 2019-06-26 NOTE — Progress Notes (Signed)
Subjective:    Patient ID: Cindy Brandt, female    DOB: [birth year], 86 y.o.   MRN: [id number]  HPI  This visit occurred during the SARS-CoV-2 public health emergency.  Safety protocols were in place, including screening questions prior to the visit, additional usage of staff PPE, and extensive cleaning of exam room while observing appropriate contact time as indicated for disinfecting solutions.   Cindy Brandt is a 86 year old female with an extensive medical history including CAD, hypertension, unstable angina, GERD, IBS, osteoarthritis, anxiety and depression, urinary incontinence, anemia, herpes zoster who presents today with several issues. She is accompanied by her daughter today who is helping to provide information for the HPI.  1) Groin Pain: Acute and intermittent for the last 6 weeks which is located to the right groin. Symptoms occur with movement and rest. The pain will also radiate down to her right lower extremity to the toes, for which she describes as sharp/shooting. She denies numbness/tingling, groin mass/bulge, over exertion, back pain, urinary symptoms. Her daughter endorses that she spends most of her time sitting, very little activity. She took one of her gabapentin tablets and noticed improvement in symptoms.    2) GERD/Nausea: Chronic daily nausea, noticed when she first wakes up and also during the day. She is compliant to her pantoprazole 40 mg and famotidine 20 mg BID. She underwent upper endoscopy in 2018 which was unremarkable. She does eat late at night, will often snack. She drinks very little water.   3) Dizziness: Intermittent and daily, feels like she has to hold onto something or she will fall. She is very sedentary throughout the day, drinking little water, has others to help her with a lot of her ADL's such as fixing her dinner plate, dishes, etc. She is also sleeping daily until 12-2 pm, staying up during the night as she cannot sleep during the night. She denies  "room spinning" sensation.   Follows with cardiology, last visit being last week and Ranexa was added to her regimen.    BP Readings from Last 3 Encounters:  06/26/19 134/80  06/22/19 112/62  05/22/19 (!) 158/85     Review of Systems  Eyes: Negative for visual disturbance.  Respiratory: Negative for shortness of breath.   Gastrointestinal: Positive for nausea. Negative for vomiting.  Musculoskeletal:       Right groin pain, right lower extremity pain  Neurological: Positive for dizziness and light-headedness. Negative for weakness and numbness.       Past Medical History:  Diagnosis Date  . Allergy   . Anemia   . Anxiety   . Arthritis   . Blood transfusion without reported diagnosis   . Cataract    a. bilerteral cataracts removed  . Coronary atherosclerosis of native coronary artery    a. 2003 Cath/unsuccessful PCI of occluded RCA;  b. 2005 NSTEMI/Cath: CTO RCA w/ L->R collats, LAD 20, LCX 26m, nl EF;  c. 11/2011 Myoview: no ischemia.  . Diastolic dysfunction [id number]   a. 11/2011 Echo: Ejection fraction 60-65%, gr1 DD, basal inferior AK;  b. 09/2013 Echo: EF 55-60%, mild LVH, no rwma, Gr1 DD, mildly dil LA.  . Gastritis    a. 04/2016 EGD: Nl esophagus, gastritis, nl duodenal bulb & 2nd portion of the duodenum.  Marland Kitchen Headache(784.0)   . Hyperlipidemia   . Hypertension   . Hypokalemia   . Tubular adenoma of colon 2013     Social History   Socioeconomic History  . Marital status:  Widowed    Spouse name: Not on file  . Number of children: 5  . Years of education: Not on file  . Highest education level: Not on file  Occupational History  . Occupation: Retired  Tobacco Use  . Smoking status: Former Smoker    Packs/day: 0.50    Years: 25.00    Pack years: 12.50    Types: Cigarettes    Quit date: 04/06/1996    Years since quitting: 23.2  . Smokeless tobacco: Never Used  Substance and Sexual Activity  . Alcohol use: No  . Drug use: No  . Sexual activity: Not on file   Other Topics Concern  . Not on file  Social History Narrative   Currently living with dtr in [place].  No regular exercise.   Social Determinants of Health   Financial Resource Strain:   . Difficulty of Paying Living Expenses:   Food Insecurity:   . Worried About Charity fundraiser in the Last Year:   . Arboriculturist in the Last Year:   Transportation Needs:   . Film/video editor (Medical):   Marland Kitchen Lack of Transportation (Non-Medical):   Physical Activity:   . Days of Exercise per Week:   . Minutes of Exercise per Session:   Stress:   . Feeling of Stress :   Social Connections:   . Frequency of Communication with Friends and Family:   . Frequency of Social Gatherings with Friends and Family:   . Attends Religious Services:   . Active Member of Clubs or Organizations:   . Attends Archivist Meetings:   Marland Kitchen Marital Status:   Intimate Partner Violence:   . Fear of Current or Ex-Partner:   . Emotionally Abused:   Marland Kitchen Physically Abused:   . Sexually Abused:     Past Surgical History:  Procedure Laterality Date  . CARPAL TUNNEL RELEASE Bilateral   . CHOLECYSTECTOMY  2004  . COLONOSCOPY  08/06/2011   Procedure: COLONOSCOPY;  Surgeon: [name], MD;  [facility];  Service: Endoscopy;  Laterality: N/A;  10:40 AM  . CORONARY ANGIOGRAPHY N/A 08/02/2017   Procedure: CORONARY ANGIOGRAPHY;  Surgeon: Lorretta Harp, MD;  Location: [facility];  Service: Cardiovascular;  Laterality: N/A;  . GALLBLADDER SURGERY    . LEFT HEART CATH AND CORONARY ANGIOGRAPHY N/A 06/15/2016   Procedure: Left Heart Cath and Coronary Angiography;  Surgeon: Lorretta Harp, MD;  Location: [place] [facility];  Service: Cardiovascular;  Laterality: N/A;  . PARTIAL HYSTERECTOMY    . repair of belly button    . UMBILICAL HERNIA REPAIR     Umbilical hernia repair as a child    Family History  Problem Relation Age of Onset  . Coronary artery disease Father   . Heart attack  Father   . Colon cancer Father   . Coronary artery disease Mother   . Stomach cancer Mother   . Heart disease Brother   . Diabetes Brother   . Pancreatic cancer Neg Hx   . Rectal cancer Neg Hx   . Esophageal cancer Neg Hx     Allergies  Allergen Reactions  . Tylenol [Acetaminophen] Other (See Comments)    Funny feeling in chest.     Current Outpatient Medications on File Prior to Visit  Medication Sig Dispense Refill  . amLODipine (NORVASC) 2.5 MG tablet Take 1 tablet (2.5 mg total) by mouth daily. 90 tablet 1  . aspirin  EC 81 MG tablet Take 81 mg by mouth every morning.    Marland Kitchen atenolol (TENORMIN) 50 MG tablet Take 1 tablet (50 mg total) by mouth 2 (two) times daily. 180 tablet 2  . atorvastatin (LIPITOR) 80 MG tablet TAKE 1 TABLET BY MOUTH EVERY DAY IN THE EVENING FOR CHOLESTEROL 90 tablet 2  . busPIRone (BUSPAR) 7.5 MG tablet Take 1 tablet by mouth twice daily for anxiety. 180 tablet 3  . calcium carbonate (CALCIUM 600) 600 MG TABS tablet Take 1,200 mg by mouth daily.    . cetirizine (ZYRTEC) 10 MG tablet Take 10 mg by mouth daily.    . clopidogrel (PLAVIX) 75 MG tablet Take 1 tablet (75 mg total) by mouth daily. 90 tablet 3  . famotidine (PEPCID) 20 MG tablet Take 20 mg by mouth 2 (two) times daily.    . isosorbide mononitrate (IMDUR) 120 MG 24 hr tablet Take 1 tablet (120 mg total) by mouth daily. 90 tablet 0  . Magnesium 200 MG TABS Take 1 tablet (200 mg total) by mouth daily. 30 tablet 11  . Melatonin 5 MG TABS Take 5 mg by mouth daily as needed (sleep).    . mirabegron ER (MYRBETRIQ) 25 MG TB24 tablet Take 1 tablet (25 mg total) by mouth daily. For bladder control. 90 tablet 0  . nitroGLYCERIN (NITROSTAT) 0.4 MG SL tablet PLACE 1 TABLET UNDER THE TONGUE EVERY 5 MIN AS NEEDED FOR CHEST PAIN 25 tablet 11  . pantoprazole (PROTONIX) 40 MG tablet Take 1 tablet by mouth once daily for heartburn. 90 tablet 3  . ranolazine (RANEXA) 500 MG 12 hr tablet Take 1 tablet (500 mg total) by  mouth 2 (two) times daily. 180 tablet 2  . sertraline (ZOLOFT) 100 MG tablet Take 1 tablet by mouth once daily for depression/anxiety. 90 tablet 1  . spironolactone (ALDACTONE) 25 MG tablet Take 0.5 tablets (12.5 mg total) by mouth daily. 45 tablet 2   No current facility-administered medications on file prior to visit.    BP 134/80   Pulse 68   Temp (!) 97.4 F (36.3 C) (Temporal)   Ht 5' (1.524 m)   Wt 143 lb 8 oz (65.1 kg)   SpO2 98%   BMI 28.03 kg/m    Objective:   Physical Exam  Constitutional: She appears well-nourished.  Cardiovascular: Normal rate and regular rhythm.  Respiratory: Effort normal and breath sounds normal.  GI: Soft. Bowel sounds are normal. There is no abdominal tenderness.  Non tender to right groin  Musculoskeletal:     Cervical back: Neck supple.     Right hip: No tenderness. Normal range of motion.       Legs:  Skin: Skin is warm and dry.  Psychiatric: She has a normal mood and affect.           Assessment & Plan:

## 2019-06-26 NOTE — Assessment & Plan Note (Signed)
Uncontrolled with frequent, daily nausea. Discussed the importance of avoiding late night snacking, laying down within 1 hour of eating, increasing water and activity level.  Given that she's on high doses of PPI and H2 blockers, we will send her to GI for evaluation. Referral placed.

## 2019-06-26 NOTE — Assessment & Plan Note (Signed)
Chronic, daily according to HPI.  DD include dehydration, orthostasis, medication interactions.  Home health PT orders placed. Encouraged to increase intake of water and activity.

## 2019-06-26 NOTE — Patient Instructions (Signed)
You may take the gabapentin up to three times daily as needed for leg pain. This may cause drowsiness.  You will be contacted regarding your referral to GI for the nausea and home health for physical threapy.  Please let us know if you have not been contacted within two weeks.   Increase your water intake.   Get up for 5-10 minutes every hour when awake.   Use the melatonin as needed for difficulty sleeping.  It was a pleasure to see you today!

## 2019-06-27 ENCOUNTER — Telehealth: Payer: Self-pay

## 2019-06-27 DIAGNOSIS — I25119 Atherosclerotic heart disease of native coronary artery with unspecified angina pectoris: Secondary | ICD-10-CM | POA: Diagnosis not present

## 2019-06-27 DIAGNOSIS — Z9181 History of falling: Secondary | ICD-10-CM | POA: Diagnosis not present

## 2019-06-27 DIAGNOSIS — E785 Hyperlipidemia, unspecified: Secondary | ICD-10-CM | POA: Diagnosis not present

## 2019-06-27 DIAGNOSIS — M1611 Unilateral primary osteoarthritis, right hip: Secondary | ICD-10-CM | POA: Diagnosis not present

## 2019-06-27 DIAGNOSIS — K589 Irritable bowel syndrome without diarrhea: Secondary | ICD-10-CM | POA: Diagnosis not present

## 2019-06-27 DIAGNOSIS — K219 Gastro-esophageal reflux disease without esophagitis: Secondary | ICD-10-CM | POA: Diagnosis not present

## 2019-06-27 DIAGNOSIS — I10 Essential (primary) hypertension: Secondary | ICD-10-CM | POA: Diagnosis not present

## 2019-06-27 DIAGNOSIS — D649 Anemia, unspecified: Secondary | ICD-10-CM | POA: Diagnosis not present

## 2019-06-27 DIAGNOSIS — R42 Dizziness and giddiness: Secondary | ICD-10-CM | POA: Diagnosis not present

## 2019-06-27 DIAGNOSIS — R7303 Prediabetes: Secondary | ICD-10-CM | POA: Diagnosis not present

## 2019-06-27 NOTE — Telephone Encounter (Signed)
Have PT call patient's cardiology with reports of hypotension. Not sure what they would like to eliminate first in regards to BP regimen.  Approved orders.

## 2019-06-27 NOTE — Telephone Encounter (Signed)
Tomasa Hosteller PT with [facility] left v.m requesting verbal orders for [facility] PT 1 x a wk for 1 wk and 2 x a wk for 2 wks and then reassessment. Also Jerallynn said today pts BP 109/52 sitting and when stood BP 80/40. P was 44-46 regular. Jerallynn wants to know what parameters Gentry Fitz NP wants to put on pts vital signs.Please advise. Pt admitted to [facility] that she was not drinking enough fluids the last couple of days.  [place] request cb after review.

## 2019-06-28 NOTE — Telephone Encounter (Signed)
Notified of Tawni Millers comments. She will contact cardiology. Gave the approval of the verbal orders.

## 2019-07-03 ENCOUNTER — Telehealth: Payer: Self-pay | Admitting: *Deleted

## 2019-07-03 DIAGNOSIS — R42 Dizziness and giddiness: Secondary | ICD-10-CM | POA: Diagnosis not present

## 2019-07-03 DIAGNOSIS — I10 Essential (primary) hypertension: Secondary | ICD-10-CM | POA: Diagnosis not present

## 2019-07-03 DIAGNOSIS — M1611 Unilateral primary osteoarthritis, right hip: Secondary | ICD-10-CM | POA: Diagnosis not present

## 2019-07-03 DIAGNOSIS — E785 Hyperlipidemia, unspecified: Secondary | ICD-10-CM | POA: Diagnosis not present

## 2019-07-03 DIAGNOSIS — D649 Anemia, unspecified: Secondary | ICD-10-CM | POA: Diagnosis not present

## 2019-07-03 DIAGNOSIS — R7303 Prediabetes: Secondary | ICD-10-CM | POA: Diagnosis not present

## 2019-07-03 DIAGNOSIS — K219 Gastro-esophageal reflux disease without esophagitis: Secondary | ICD-10-CM | POA: Diagnosis not present

## 2019-07-03 DIAGNOSIS — Z9181 History of falling: Secondary | ICD-10-CM | POA: Diagnosis not present

## 2019-07-03 DIAGNOSIS — I25119 Atherosclerotic heart disease of native coronary artery with unspecified angina pectoris: Secondary | ICD-10-CM | POA: Diagnosis not present

## 2019-07-03 DIAGNOSIS — K589 Irritable bowel syndrome without diarrhea: Secondary | ICD-10-CM | POA: Diagnosis not present

## 2019-07-03 NOTE — Telephone Encounter (Signed)
Osteoarthritis, CAD, hypertension, prediabetes. Try those diagnoses for coding, have them contact us if they need further information.

## 2019-07-03 NOTE — Telephone Encounter (Signed)
Cindy Brandt PT with Cindy Brandt left a voicemail stating that she got a note from their coder needing additional diagnoses  Cindy Brandt stated that the coder needs an underlying diagnoses to attach to the reason for PT which is dizziness and pain. Cindy Brandt requested a call back with additional diagnoses for the coder.

## 2019-07-04 ENCOUNTER — Telehealth (INDEPENDENT_AMBULATORY_CARE_PROVIDER_SITE_OTHER): Payer: Medicare Other | Admitting: Cardiology

## 2019-07-04 ENCOUNTER — Encounter: Payer: Self-pay | Admitting: Cardiology

## 2019-07-04 VITALS — BP 151/86 | HR 65 | Ht 60.0 in | Wt 143.0 lb

## 2019-07-04 DIAGNOSIS — I10 Essential (primary) hypertension: Secondary | ICD-10-CM | POA: Diagnosis not present

## 2019-07-04 DIAGNOSIS — R079 Chest pain, unspecified: Secondary | ICD-10-CM

## 2019-07-04 DIAGNOSIS — I25118 Atherosclerotic heart disease of native coronary artery with other forms of angina pectoris: Secondary | ICD-10-CM | POA: Diagnosis not present

## 2019-07-04 DIAGNOSIS — I5189 Other ill-defined heart diseases: Secondary | ICD-10-CM

## 2019-07-04 DIAGNOSIS — I2510 Atherosclerotic heart disease of native coronary artery without angina pectoris: Secondary | ICD-10-CM | POA: Diagnosis not present

## 2019-07-04 DIAGNOSIS — Z9861 Coronary angioplasty status: Secondary | ICD-10-CM

## 2019-07-04 MED ORDER — ATENOLOL 25 MG PO TABS: 25.0000 mg | ORAL_TABLET | Freq: Every day | ORAL | 2 refills | Status: DC

## 2019-07-04 NOTE — Telephone Encounter (Signed)
Message left for patient to return my call.  

## 2019-07-04 NOTE — Telephone Encounter (Signed)
Cindy Brandt called back. I relayed the message to her with the diagnoses. She did not need anything else at the moment but will call back if anything comes up.

## 2019-07-04 NOTE — Patient Instructions (Addendum)
Medication Instructions:  DECREASE- Atenolol 25 mg by mouth daily  *If you need a refill on your cardiac medications before your next appointment, please call your pharmacy*   Lab Work: None Ordered   Testing/Procedures: None Ordered   Follow-Up: At [facility], you and your health needs are our priority.  As part of our continuing mission to provide you with exceptional heart care, we have created designated Provider Care Teams.  These Care Teams include your primary Cardiologist (physician) and Advanced Practice Providers (APPs -  Physician Assistants and Nurse Practitioners) who all work together to provide you with the care you need, when you need it.  We recommend signing up for the patient portal called "MyChart".  Sign up information is provided on this After Visit Summary.  MyChart is used to connect with patients for Virtual Visits (Telemedicine).  Patients are able to view lab/test results, encounter notes, upcoming appointments, etc.  Non-urgent messages can be sent to your provider as well.   To learn more about what you can do with MyChart, go to [email or web address].    Your next appointment:   4 month(s)  The format for your next appointment:   In Person  Provider:   You may see Quay Burow, MD or one of the following Advanced Practice Providers on your designated Care Team:    Kerin Ransom, PA-C  [place], [place]  Coletta Memos, [place]

## 2019-07-04 NOTE — Progress Notes (Signed)
Virtual Visit via Telephone Note   This visit type was conducted due to national recommendations for restrictions regarding the COVID-19 Pandemic (e.g. social distancing) in an effort to limit this patient's exposure and mitigate transmission in our community.  Due to her co-morbid illnesses, this patient is at least at moderate risk for complications without adequate follow up.  This format is felt to be most appropriate for this patient at this time.  The patient did not have access to video technology/had technical difficulties with video requiring transitioning to audio format only (telephone).  All issues noted in this document were discussed and addressed.  No physical exam could be performed with this format.  Please refer to the patient's chart for her  consent to telehealth for [facility].   The patient was identified using 2 identifiers.  Date:  07/04/2019   ID:  Cindy Brandt, DOB 02-28-1932, MRN [id number]  Patient Location: Home Provider Location: Home  PCP:  [name], NP  Cardiologist:  Quay Burow, MD  Electrophysiologist:  None   Evaluation Performed:  Follow-Up Visit  Chief Complaint:  none  History of Present Illness:    Cindy Brandt is a 86 y.o. female with a  hx of CAD,s/p RCA PCI in 2005, Cath in April 2018revealed an occluded RCA with left right collaterals, occluded nondominant circumflex, high-grade ostial ramus branch stenosis and 50% proximal LAD stenosis. She had normal LV function with grade 2 diastolic dysfunction.Medical therapy was recommended.  In [id number] the pt's daughter reported the patient was using a lot of SL NTG. She was admitted for cath 08/02/17. This revealed the RI to be occluded but no change in the 50% LAD which supplied collaterals to the RCA. The plan is for continued medical Rx.  In July 2020 she was seen and had complained if fatigue with exertion and her beta blocker was decreased.  She seemed to be improved at f/u in  Sept 2020.  She was seen by Jory Sims 05/22/2019 again complaining of fatigue and chest pain.  Her Amlodipine and Atenolol were cut back thinking hypotension and bradycardia may be contributing.   She was seen again 06/22/2019 and complained of chest pain that was concerning for angina.  I added Ranexa 500 mg BID and she was contacted today for follow up.  She told me she thinks her chest has improved.  Her main complaint was fatigue, especially in the morning before she takes her medications.  She has not had syncope but has had some documented orthostatic B/P by the physical therapist.   The patient does not have symptoms concerning for COVID-19 infection (fever, chills, cough, or new shortness of breath).    Past Medical History:  Diagnosis Date  . Allergy   . Anemia   . Anxiety   . Arthritis   . Blood transfusion without reported diagnosis   . Cataract    a. bilerteral cataracts removed  . Coronary atherosclerosis of native coronary artery    a. 2003 Cath/unsuccessful PCI of occluded RCA;  b. 2005 NSTEMI/Cath: CTO RCA w/ L->R collats, LAD 20, LCX 5m, nl EF;  c. 11/2011 Myoview: no ischemia.  . Diastolic dysfunction [id number]   a. 11/2011 Echo: Ejection fraction 60-65%, gr1 DD, basal inferior AK;  b. 09/2013 Echo: EF 55-60%, mild LVH, no rwma, Gr1 DD, mildly dil LA.  . Gastritis    a. 04/2016 EGD: Nl esophagus, gastritis, nl duodenal bulb & 2nd portion of the duodenum.  Marland Kitchen Headache(784.0)   .  Hyperlipidemia   . Hypertension   . Hypokalemia   . Tubular adenoma of colon 2013   Past Surgical History:  Procedure Laterality Date  . CARPAL TUNNEL RELEASE Bilateral   . CHOLECYSTECTOMY  2004  . COLONOSCOPY  08/06/2011   Procedure: COLONOSCOPY;  Surgeon: [name], MD;  [facility];  Service: Endoscopy;  Laterality: N/A;  10:40 AM  . CORONARY ANGIOGRAPHY N/A 08/02/2017   Procedure: CORONARY ANGIOGRAPHY;  Surgeon: Lorretta Harp, MD;  Location: [place] [facility];   Service: Cardiovascular;  Laterality: N/A;  . GALLBLADDER SURGERY    . LEFT HEART CATH AND CORONARY ANGIOGRAPHY N/A 06/15/2016   Procedure: Left Heart Cath and Coronary Angiography;  Surgeon: Lorretta Harp, MD;  Location: [place] [facility];  Service: Cardiovascular;  Laterality: N/A;  . PARTIAL HYSTERECTOMY    . repair of belly button    . UMBILICAL HERNIA REPAIR     Umbilical hernia repair as a child     Current Meds  Medication Sig  . amLODipine (NORVASC) 2.5 MG tablet Take 1 tablet (2.5 mg total) by mouth daily.  Marland Kitchen aspirin EC 81 MG tablet Take 81 mg by mouth every morning.  Marland Kitchen atenolol (TENORMIN) 50 MG tablet Take 1 tablet (50 mg total) by mouth 2 (two) times daily.  Marland Kitchen atorvastatin (LIPITOR) 80 MG tablet TAKE 1 TABLET BY MOUTH EVERY DAY IN THE EVENING FOR CHOLESTEROL  . busPIRone (BUSPAR) 7.5 MG tablet Take 1 tablet by mouth twice daily for anxiety.  . calcium carbonate (CALCIUM 600) 600 MG TABS tablet Take 1,200 mg by mouth daily.  . cetirizine (ZYRTEC) 10 MG tablet Take 10 mg by mouth daily.  . clopidogrel (PLAVIX) 75 MG tablet Take 1 tablet (75 mg total) by mouth daily.  . famotidine (PEPCID) 20 MG tablet Take 20 mg by mouth 2 (two) times daily.  Marland Kitchen gabapentin (NEURONTIN) 100 MG capsule Take 1 capsule up to three times daily for nerve pain. May cause drowsiness.  . isosorbide mononitrate (IMDUR) 120 MG 24 hr tablet Take 1 tablet (120 mg total) by mouth daily.  . Magnesium 200 MG TABS Take 1 tablet (200 mg total) by mouth daily.  . Melatonin 5 MG TABS Take 5 mg by mouth daily as needed (sleep).  . mirabegron ER (MYRBETRIQ) 25 MG TB24 tablet Take 1 tablet (25 mg total) by mouth daily. For bladder control.  . nitroGLYCERIN (NITROSTAT) 0.4 MG SL tablet PLACE 1 TABLET UNDER THE TONGUE EVERY 5 MIN AS NEEDED FOR CHEST PAIN  . pantoprazole (PROTONIX) 40 MG tablet Take 1 tablet by mouth once daily for heartburn.  . ranolazine (RANEXA) 500 MG 12 hr tablet Take 1 tablet (500 mg total) by  mouth 2 (two) times daily.  . sertraline (ZOLOFT) 100 MG tablet Take 1 tablet by mouth once daily for depression/anxiety.  Marland Kitchen spironolactone (ALDACTONE) 25 MG tablet Take 0.5 tablets (12.5 mg total) by mouth daily.     Allergies:   Tylenol [acetaminophen]   Social History   Tobacco Use  . Smoking status: Former Smoker    Packs/day: 0.50    Years: 25.00    Pack years: 12.50    Types: Cigarettes    Quit date: 04/06/1996    Years since quitting: 23.2  . Smokeless tobacco: Never Used  Substance Use Topics  . Alcohol use: No  . Drug use: No     Family Hx: The patient's family history includes Colon cancer in her father; Coronary  artery disease in her father and mother; Diabetes in her brother; Heart attack in her father; Heart disease in her brother; Stomach cancer in her mother. There is no history of Pancreatic cancer, Rectal cancer, or Esophageal cancer.  ROS:   Please see the history of present illness.    All other systems reviewed and are negative.   Prior CV studies:   The following studies were reviewed today: Cath June 2019  Labs/Other Tests and Data Reviewed:    EKG:  An ECG dated 05/19/2019 was personally reviewed today and demonstrated:  NSR-SB-55, incomplete RBBB  Recent Labs: 12/05/2018: ALT 15 05/22/2019: BUN 20; Creatinine, Ser 0.97; Hemoglobin 12.6; Magnesium 1.6; Platelets 207; Potassium 4.1; Sodium 139   Recent Lipid Panel Lab Results  Component Value Date/Time   CHOL 118 12/05/2018 09:48 AM   TRIG 64.0 12/05/2018 09:48 AM   HDL 54.10 12/05/2018 09:48 AM   CHOLHDL 2 12/05/2018 09:48 AM   LDLCALC 51 12/05/2018 09:48 AM    Wt Readings from Last 3 Encounters:  07/04/19 143 lb (64.9 kg)  06/26/19 143 lb 8 oz (65.1 kg)  06/22/19 145 lb (65.8 kg)     Objective:    Vital Signs:  BP (!) 151/86   Pulse 65   Ht 5' (1.524 m)   Wt 143 lb (64.9 kg)   BMI 27.93 kg/m    VITAL SIGNS:  reviewed  ASSESSMENT & PLAN:    Chest pain  She reports improvement  with the addition of Ranexa.  She is already beta blocked, on Imdur 120 mg, and on low dose Ca++ blocker. She is not very active at home and does not appear to be a great candidate for cath/PCI.   Essential hypertension B/P controlled on current medications.  I wonder if her fatigue is secondary to bradycardia.  I suggested we decrease her Atenolol to 25 mg daily.  If she has further documented orthostatic hypotension I would stop her Aldactone.   CAD S/P percutaneous coronary angioplasty RCA PCI in 2005. Cath April 2018- occluded RCA, occluded non dominant CFX, high grade RI, 50% LAD with L-R collaterals Normal LVF. Plan is medical Rx Re look cath 08/02/17 for chest pain- no real change except CFX now occluded, LAD unchanged-50%- continue medical rx  COVID-19 Education: The signs and symptoms of COVID-19 were discussed with the patient and how to seek care for testing (follow up with PCP or arrange E-visit).  The importance of social distancing was discussed today.  Time:   Today, I have spent 20 minutes with the patient with telehealth technology discussing the above problems.     Medication Adjustments/Labs and Tests Ordered: Current medicines are reviewed at length with the patient today.  Concerns regarding medicines are outlined above.   Tests Ordered: No orders of the defined types were placed in this encounter.   Medication Changes: No orders of the defined types were placed in this encounter.   Follow Up:  In Person Dr Gwenlyn Found in Sept  Signed, Ilario Dhaliwal, Hershal Coria  07/04/2019 1:57 PM    [place]

## 2019-07-06 DIAGNOSIS — R7303 Prediabetes: Secondary | ICD-10-CM | POA: Diagnosis not present

## 2019-07-06 DIAGNOSIS — I25119 Atherosclerotic heart disease of native coronary artery with unspecified angina pectoris: Secondary | ICD-10-CM | POA: Diagnosis not present

## 2019-07-06 DIAGNOSIS — R42 Dizziness and giddiness: Secondary | ICD-10-CM | POA: Diagnosis not present

## 2019-07-06 DIAGNOSIS — K219 Gastro-esophageal reflux disease without esophagitis: Secondary | ICD-10-CM | POA: Diagnosis not present

## 2019-07-06 DIAGNOSIS — M1611 Unilateral primary osteoarthritis, right hip: Secondary | ICD-10-CM | POA: Diagnosis not present

## 2019-07-06 DIAGNOSIS — Z9181 History of falling: Secondary | ICD-10-CM | POA: Diagnosis not present

## 2019-07-06 DIAGNOSIS — I10 Essential (primary) hypertension: Secondary | ICD-10-CM | POA: Diagnosis not present

## 2019-07-06 DIAGNOSIS — E785 Hyperlipidemia, unspecified: Secondary | ICD-10-CM | POA: Diagnosis not present

## 2019-07-06 DIAGNOSIS — K589 Irritable bowel syndrome without diarrhea: Secondary | ICD-10-CM | POA: Diagnosis not present

## 2019-07-06 DIAGNOSIS — D649 Anemia, unspecified: Secondary | ICD-10-CM | POA: Diagnosis not present

## 2019-07-08 ENCOUNTER — Other Ambulatory Visit: Payer: Self-pay | Admitting: Cardiology

## 2019-07-10 DIAGNOSIS — R7303 Prediabetes: Secondary | ICD-10-CM | POA: Diagnosis not present

## 2019-07-10 DIAGNOSIS — R42 Dizziness and giddiness: Secondary | ICD-10-CM | POA: Diagnosis not present

## 2019-07-10 DIAGNOSIS — K219 Gastro-esophageal reflux disease without esophagitis: Secondary | ICD-10-CM | POA: Diagnosis not present

## 2019-07-10 DIAGNOSIS — K589 Irritable bowel syndrome without diarrhea: Secondary | ICD-10-CM | POA: Diagnosis not present

## 2019-07-10 DIAGNOSIS — D649 Anemia, unspecified: Secondary | ICD-10-CM | POA: Diagnosis not present

## 2019-07-10 DIAGNOSIS — Z9181 History of falling: Secondary | ICD-10-CM | POA: Diagnosis not present

## 2019-07-10 DIAGNOSIS — I25119 Atherosclerotic heart disease of native coronary artery with unspecified angina pectoris: Secondary | ICD-10-CM | POA: Diagnosis not present

## 2019-07-10 DIAGNOSIS — M1611 Unilateral primary osteoarthritis, right hip: Secondary | ICD-10-CM | POA: Diagnosis not present

## 2019-07-10 DIAGNOSIS — E785 Hyperlipidemia, unspecified: Secondary | ICD-10-CM | POA: Diagnosis not present

## 2019-07-10 DIAGNOSIS — I10 Essential (primary) hypertension: Secondary | ICD-10-CM | POA: Diagnosis not present

## 2019-07-12 ENCOUNTER — Telehealth: Payer: Self-pay

## 2019-07-12 DIAGNOSIS — K589 Irritable bowel syndrome without diarrhea: Secondary | ICD-10-CM | POA: Diagnosis not present

## 2019-07-12 DIAGNOSIS — E785 Hyperlipidemia, unspecified: Secondary | ICD-10-CM | POA: Diagnosis not present

## 2019-07-12 DIAGNOSIS — I10 Essential (primary) hypertension: Secondary | ICD-10-CM | POA: Diagnosis not present

## 2019-07-12 DIAGNOSIS — K219 Gastro-esophageal reflux disease without esophagitis: Secondary | ICD-10-CM | POA: Diagnosis not present

## 2019-07-12 DIAGNOSIS — R7303 Prediabetes: Secondary | ICD-10-CM | POA: Diagnosis not present

## 2019-07-12 DIAGNOSIS — M1611 Unilateral primary osteoarthritis, right hip: Secondary | ICD-10-CM | POA: Diagnosis not present

## 2019-07-12 DIAGNOSIS — D649 Anemia, unspecified: Secondary | ICD-10-CM | POA: Diagnosis not present

## 2019-07-12 DIAGNOSIS — R42 Dizziness and giddiness: Secondary | ICD-10-CM | POA: Diagnosis not present

## 2019-07-12 DIAGNOSIS — Z9181 History of falling: Secondary | ICD-10-CM | POA: Diagnosis not present

## 2019-07-12 DIAGNOSIS — I25119 Atherosclerotic heart disease of native coronary artery with unspecified angina pectoris: Secondary | ICD-10-CM | POA: Diagnosis not present

## 2019-07-12 NOTE — Telephone Encounter (Signed)
Approved.  

## 2019-07-12 NOTE — Telephone Encounter (Signed)
Cindy Brandt PT left v/m requesting addition extension of Brandt PT 2 x a wk for 2 wks and 1 x a wk for 1 wk. Pt is making nice progress but would like for pt to improve her mobility safety around the home.

## 2019-07-12 NOTE — Telephone Encounter (Signed)
Gave the approval of the verbal orders. 

## 2019-07-17 DIAGNOSIS — E785 Hyperlipidemia, unspecified: Secondary | ICD-10-CM | POA: Diagnosis not present

## 2019-07-17 DIAGNOSIS — Z9181 History of falling: Secondary | ICD-10-CM | POA: Diagnosis not present

## 2019-07-17 DIAGNOSIS — M1611 Unilateral primary osteoarthritis, right hip: Secondary | ICD-10-CM | POA: Diagnosis not present

## 2019-07-17 DIAGNOSIS — D649 Anemia, unspecified: Secondary | ICD-10-CM | POA: Diagnosis not present

## 2019-07-17 DIAGNOSIS — R42 Dizziness and giddiness: Secondary | ICD-10-CM | POA: Diagnosis not present

## 2019-07-17 DIAGNOSIS — R7303 Prediabetes: Secondary | ICD-10-CM | POA: Diagnosis not present

## 2019-07-17 DIAGNOSIS — K589 Irritable bowel syndrome without diarrhea: Secondary | ICD-10-CM | POA: Diagnosis not present

## 2019-07-17 DIAGNOSIS — K219 Gastro-esophageal reflux disease without esophagitis: Secondary | ICD-10-CM | POA: Diagnosis not present

## 2019-07-17 DIAGNOSIS — I10 Essential (primary) hypertension: Secondary | ICD-10-CM | POA: Diagnosis not present

## 2019-07-17 DIAGNOSIS — I25119 Atherosclerotic heart disease of native coronary artery with unspecified angina pectoris: Secondary | ICD-10-CM | POA: Diagnosis not present

## 2019-07-20 DIAGNOSIS — I25119 Atherosclerotic heart disease of native coronary artery with unspecified angina pectoris: Secondary | ICD-10-CM | POA: Diagnosis not present

## 2019-07-20 DIAGNOSIS — K219 Gastro-esophageal reflux disease without esophagitis: Secondary | ICD-10-CM | POA: Diagnosis not present

## 2019-07-20 DIAGNOSIS — R7303 Prediabetes: Secondary | ICD-10-CM | POA: Diagnosis not present

## 2019-07-20 DIAGNOSIS — E785 Hyperlipidemia, unspecified: Secondary | ICD-10-CM | POA: Diagnosis not present

## 2019-07-20 DIAGNOSIS — R42 Dizziness and giddiness: Secondary | ICD-10-CM | POA: Diagnosis not present

## 2019-07-20 DIAGNOSIS — D649 Anemia, unspecified: Secondary | ICD-10-CM | POA: Diagnosis not present

## 2019-07-20 DIAGNOSIS — Z9181 History of falling: Secondary | ICD-10-CM | POA: Diagnosis not present

## 2019-07-20 DIAGNOSIS — K589 Irritable bowel syndrome without diarrhea: Secondary | ICD-10-CM | POA: Diagnosis not present

## 2019-07-20 DIAGNOSIS — M1611 Unilateral primary osteoarthritis, right hip: Secondary | ICD-10-CM | POA: Diagnosis not present

## 2019-07-20 DIAGNOSIS — I10 Essential (primary) hypertension: Secondary | ICD-10-CM | POA: Diagnosis not present

## 2019-07-24 DIAGNOSIS — D649 Anemia, unspecified: Secondary | ICD-10-CM | POA: Diagnosis not present

## 2019-07-24 DIAGNOSIS — I10 Essential (primary) hypertension: Secondary | ICD-10-CM | POA: Diagnosis not present

## 2019-07-24 DIAGNOSIS — R7303 Prediabetes: Secondary | ICD-10-CM | POA: Diagnosis not present

## 2019-07-24 DIAGNOSIS — E785 Hyperlipidemia, unspecified: Secondary | ICD-10-CM | POA: Diagnosis not present

## 2019-07-24 DIAGNOSIS — R42 Dizziness and giddiness: Secondary | ICD-10-CM | POA: Diagnosis not present

## 2019-07-24 DIAGNOSIS — Z9181 History of falling: Secondary | ICD-10-CM | POA: Diagnosis not present

## 2019-07-24 DIAGNOSIS — K219 Gastro-esophageal reflux disease without esophagitis: Secondary | ICD-10-CM | POA: Diagnosis not present

## 2019-07-24 DIAGNOSIS — I25119 Atherosclerotic heart disease of native coronary artery with unspecified angina pectoris: Secondary | ICD-10-CM | POA: Diagnosis not present

## 2019-07-24 DIAGNOSIS — K589 Irritable bowel syndrome without diarrhea: Secondary | ICD-10-CM | POA: Diagnosis not present

## 2019-07-24 DIAGNOSIS — M1611 Unilateral primary osteoarthritis, right hip: Secondary | ICD-10-CM | POA: Diagnosis not present

## 2019-07-27 DIAGNOSIS — R7303 Prediabetes: Secondary | ICD-10-CM | POA: Diagnosis not present

## 2019-07-27 DIAGNOSIS — K589 Irritable bowel syndrome without diarrhea: Secondary | ICD-10-CM | POA: Diagnosis not present

## 2019-07-27 DIAGNOSIS — I10 Essential (primary) hypertension: Secondary | ICD-10-CM | POA: Diagnosis not present

## 2019-07-27 DIAGNOSIS — E785 Hyperlipidemia, unspecified: Secondary | ICD-10-CM | POA: Diagnosis not present

## 2019-07-27 DIAGNOSIS — Z9181 History of falling: Secondary | ICD-10-CM | POA: Diagnosis not present

## 2019-07-27 DIAGNOSIS — K219 Gastro-esophageal reflux disease without esophagitis: Secondary | ICD-10-CM | POA: Diagnosis not present

## 2019-07-27 DIAGNOSIS — R42 Dizziness and giddiness: Secondary | ICD-10-CM | POA: Diagnosis not present

## 2019-07-27 DIAGNOSIS — M1611 Unilateral primary osteoarthritis, right hip: Secondary | ICD-10-CM | POA: Diagnosis not present

## 2019-07-27 DIAGNOSIS — D649 Anemia, unspecified: Secondary | ICD-10-CM | POA: Diagnosis not present

## 2019-07-27 DIAGNOSIS — I25119 Atherosclerotic heart disease of native coronary artery with unspecified angina pectoris: Secondary | ICD-10-CM | POA: Diagnosis not present

## 2019-07-31 ENCOUNTER — Other Ambulatory Visit: Payer: Self-pay | Admitting: Cardiovascular Disease

## 2019-08-01 DIAGNOSIS — K219 Gastro-esophageal reflux disease without esophagitis: Secondary | ICD-10-CM | POA: Diagnosis not present

## 2019-08-01 DIAGNOSIS — R42 Dizziness and giddiness: Secondary | ICD-10-CM | POA: Diagnosis not present

## 2019-08-01 DIAGNOSIS — D649 Anemia, unspecified: Secondary | ICD-10-CM | POA: Diagnosis not present

## 2019-08-01 DIAGNOSIS — R7303 Prediabetes: Secondary | ICD-10-CM | POA: Diagnosis not present

## 2019-08-01 DIAGNOSIS — E785 Hyperlipidemia, unspecified: Secondary | ICD-10-CM | POA: Diagnosis not present

## 2019-08-01 DIAGNOSIS — K589 Irritable bowel syndrome without diarrhea: Secondary | ICD-10-CM | POA: Diagnosis not present

## 2019-08-01 DIAGNOSIS — I10 Essential (primary) hypertension: Secondary | ICD-10-CM | POA: Diagnosis not present

## 2019-08-01 DIAGNOSIS — M1611 Unilateral primary osteoarthritis, right hip: Secondary | ICD-10-CM | POA: Diagnosis not present

## 2019-08-01 DIAGNOSIS — Z9181 History of falling: Secondary | ICD-10-CM | POA: Diagnosis not present

## 2019-08-01 DIAGNOSIS — I25119 Atherosclerotic heart disease of native coronary artery with unspecified angina pectoris: Secondary | ICD-10-CM | POA: Diagnosis not present

## 2019-08-03 ENCOUNTER — Telehealth: Payer: Self-pay

## 2019-08-03 NOTE — Telephone Encounter (Signed)
Approved and very glad to hear!

## 2019-08-03 NOTE — Telephone Encounter (Signed)
Jearline PT with [facility] left v/m requesting verbal orders to continue [facility] PT 1 x a wk for 3 more wks for compliance with home exercise program; at this time pt is doing well and making progress.

## 2019-08-09 ENCOUNTER — Other Ambulatory Visit: Payer: Self-pay | Admitting: Cardiovascular Disease

## 2019-08-09 ENCOUNTER — Other Ambulatory Visit: Payer: Self-pay | Admitting: Primary Care

## 2019-08-09 DIAGNOSIS — M79604 Pain in right leg: Secondary | ICD-10-CM

## 2019-08-10 DIAGNOSIS — K219 Gastro-esophageal reflux disease without esophagitis: Secondary | ICD-10-CM | POA: Diagnosis not present

## 2019-08-10 DIAGNOSIS — R7303 Prediabetes: Secondary | ICD-10-CM | POA: Diagnosis not present

## 2019-08-10 DIAGNOSIS — I10 Essential (primary) hypertension: Secondary | ICD-10-CM | POA: Diagnosis not present

## 2019-08-10 DIAGNOSIS — Z9181 History of falling: Secondary | ICD-10-CM | POA: Diagnosis not present

## 2019-08-10 DIAGNOSIS — D649 Anemia, unspecified: Secondary | ICD-10-CM | POA: Diagnosis not present

## 2019-08-10 DIAGNOSIS — I25119 Atherosclerotic heart disease of native coronary artery with unspecified angina pectoris: Secondary | ICD-10-CM | POA: Diagnosis not present

## 2019-08-10 DIAGNOSIS — E785 Hyperlipidemia, unspecified: Secondary | ICD-10-CM | POA: Diagnosis not present

## 2019-08-10 DIAGNOSIS — R42 Dizziness and giddiness: Secondary | ICD-10-CM | POA: Diagnosis not present

## 2019-08-10 DIAGNOSIS — M1611 Unilateral primary osteoarthritis, right hip: Secondary | ICD-10-CM | POA: Diagnosis not present

## 2019-08-10 DIAGNOSIS — K589 Irritable bowel syndrome without diarrhea: Secondary | ICD-10-CM | POA: Diagnosis not present

## 2019-08-11 NOTE — Telephone Encounter (Signed)
Refills sent to pharmacy. 

## 2019-08-11 NOTE — Telephone Encounter (Signed)
Last prescribed on 06/26/2019 Last OV (follow up/acute ) with Cindy Brandt on 06/26/2019 No future OV scheduled

## 2019-08-15 ENCOUNTER — Other Ambulatory Visit: Payer: Self-pay

## 2019-08-15 ENCOUNTER — Ambulatory Visit: Payer: Medicare Other | Admitting: Gastroenterology

## 2019-08-15 VITALS — BP 115/68 | HR 65 | Temp 98.0°F | Ht 60.0 in | Wt 139.0 lb

## 2019-08-15 DIAGNOSIS — R110 Nausea: Secondary | ICD-10-CM

## 2019-08-15 DIAGNOSIS — K5900 Constipation, unspecified: Secondary | ICD-10-CM | POA: Diagnosis not present

## 2019-08-15 NOTE — Progress Notes (Signed)
Jonathon Bellows MD, MRCP(U.K) [address]  [place]  [place], [place] [address]  Main: [phone]  Fax: [phone]   Gastroenterology Consultation  Referring Provider:     [name], NP Primary Care Physician:  [name], NP Primary Gastroenterologist:  Dr. Jonathon Bellows  Reason for Consultation:     GERD        HPI:   Cindy Brandt is a 86 y.o. y/o female referred for consultation & management  by  [name], NP.    She has been referred for GERD while on high-dose PPI and famotidine with breakthrough nausea and belching..  Previously been seen by Dr. Lucio Edward .  At that point of time she did have a history of heartburn and chest pain.  Felt that the chest pain she had in 2018 was multifactorial from GERD and musculoskeletal etiology.  Also was treated for IBS constipation.  She underwent an endoscopy in February 2018 which showed some inflammation in the gastric antrum biopsies showed reactive gastropathy.  In March 2021 hemoglobin 12.6 g and a creatinine that was normal.  She states that the main complaint she has is nausea when she wakes up.  Denies any heartburn as such.  Does not have a bowel movement every day.  When she does will have 1 every 2 or 3 days it is very hard.  She does state that her symptoms are worse when she has not had a bowel movement for a few days.  She is very hard of hearing and was accompanied by her daughter-in-law.  No other symptoms.  She is not taking anything for constipation except applesauce.  Past Medical History:  Diagnosis Date  . Allergy   . Anemia   . Anxiety   . Arthritis   . Blood transfusion without reported diagnosis   . Cataract    a. bilerteral cataracts removed  . Coronary atherosclerosis of native coronary artery    a. 2003 Cath/unsuccessful PCI of occluded RCA;  b. 2005 NSTEMI/Cath: CTO RCA w/ L->R collats, LAD 20, LCX 25m, nl EF;  c. 11/2011 Myoview: no ischemia.  . Diastolic dysfunction  77/82/4235   a. 11/2011 Echo: Ejection fraction 60-65%, gr1 DD, basal inferior AK;  b. 09/2013 Echo: EF 55-60%, mild LVH, no rwma, Gr1 DD, mildly dil LA.  . Gastritis    a. 04/2016 EGD: Nl esophagus, gastritis, nl duodenal bulb & 2nd portion of the duodenum.  Marland Kitchen Headache(784.0)   . Hyperlipidemia   . Hypertension   . Hypokalemia   . Tubular adenoma of colon 2013    Past Surgical History:  Procedure Laterality Date  . CARPAL TUNNEL RELEASE Bilateral   . CHOLECYSTECTOMY  2004  . COLONOSCOPY  08/06/2011   Procedure: COLONOSCOPY;  Surgeon: [name], MD;  [facility];  Service: Endoscopy;  Laterality: N/A;  10:40 AM  . CORONARY ANGIOGRAPHY N/A 08/02/2017   Procedure: CORONARY ANGIOGRAPHY;  Surgeon: Lorretta Harp, MD;  Location: [facility];  Service: Cardiovascular;  Laterality: N/A;  . GALLBLADDER SURGERY    . LEFT HEART CATH AND CORONARY ANGIOGRAPHY N/A 06/15/2016   Procedure: Left Heart Cath and Coronary Angiography;  Surgeon: Lorretta Harp, MD;  Location: [facility];  Service: Cardiovascular;  Laterality: N/A;  . PARTIAL HYSTERECTOMY    . repair of belly button    . UMBILICAL HERNIA REPAIR     Umbilical hernia repair as a child  Prior to Admission medications   Medication Sig Start Date End Date Taking? Authorizing Provider  amLODipine (NORVASC) 2.5 MG tablet Take 1 tablet (2.5 mg total) by mouth daily. 05/22/19   Lendon Colonel, NP  aspirin EC 81 MG tablet Take 81 mg by mouth every morning.    [provider]  atenolol (TENORMIN) 25 MG tablet Take 1 tablet (25 mg total) by mouth daily. 07/04/19   Erlene Quan, PA-C  atorvastatin (LIPITOR) 80 MG tablet TAKE 1 TABLET BY MOUTH EVERY DAY IN THE EVENING FOR CHOLESTEROL 02/16/19   [name], NP  busPIRone (BUSPAR) 7.5 MG tablet Take 1 tablet by mouth twice daily for anxiety. 12/05/18   [name], NP  calcium carbonate (CALCIUM 600) 600 MG TABS tablet Take 1,200 mg by mouth  daily.    [provider]  cetirizine (ZYRTEC) 10 MG tablet Take 10 mg by mouth daily.    [provider]  clopidogrel (PLAVIX) 75 MG tablet Take 1 tablet (75 mg total) by mouth daily. 09/21/18   Erlene Quan, PA-C  famotidine (PEPCID) 20 MG tablet Take 20 mg by mouth 2 (two) times daily.    [provider]  gabapentin (NEURONTIN) 100 MG capsule TAKE 1 CAPSULE UP TO THREE TIMES DAILY FOR NERVE PAIN. MAY CAUSE DROWSINESS. 08/11/19   [name], NP  isosorbide mononitrate (IMDUR) 120 MG 24 hr tablet TAKE 1 TABLET BY MOUTH EVERY DAY 07/11/19   Erlene Quan, PA-C  isosorbide mononitrate (IMDUR) 120 MG 24 hr tablet Take 1 tablet (120 mg total) by mouth daily. PT OVERDUE FOR OV PLEASE CALL FOR APPT 08/10/19   Lorretta Harp, MD  Magnesium 200 MG TABS Take 1 tablet (200 mg total) by mouth daily. 05/30/19   Lendon Colonel, NP  Melatonin 5 MG TABS Take 5 mg by mouth daily as needed (sleep).    [provider]  mirabegron ER (MYRBETRIQ) 25 MG TB24 tablet Take 1 tablet (25 mg total) by mouth daily. For bladder control. 12/05/18   [name], NP  nitroGLYCERIN (NITROSTAT) 0.4 MG SL tablet PLACE 1 TABLET UNDER THE TONGUE EVERY 5 MIN AS NEEDED FOR CHEST PAIN 08/01/19   Lorretta Harp, MD  pantoprazole (PROTONIX) 40 MG tablet Take 1 tablet by mouth once daily for heartburn. 12/05/18   [name], NP  ranolazine (RANEXA) 500 MG 12 hr tablet Take 1 tablet (500 mg total) by mouth 2 (two) times daily. 06/22/19   Erlene Quan, PA-C  sertraline (ZOLOFT) 100 MG tablet Take 1 tablet by mouth once daily for depression/anxiety. 02/15/19   [name], NP  spironolactone (ALDACTONE) 25 MG tablet Take 0.5 tablets (12.5 mg total) by mouth daily. 12/15/18   Erlene Quan, PA-C    Family History  Problem Relation Age of Onset  . Coronary artery disease Father   . Heart attack Father   . Colon cancer Father   . Coronary artery disease Mother   .  Stomach cancer Mother   . Heart disease Brother   . Diabetes Brother   . Pancreatic cancer Neg Hx   . Rectal cancer Neg Hx   . Esophageal cancer Neg Hx      Social History   Tobacco Use  . Smoking status: Former Smoker    Packs/day: 0.50    Years: 25.00    Pack years: 12.50    Types: Cigarettes    Quit date: 04/06/1996  Years since quitting: 23.3  . Smokeless tobacco: Never Used  Substance Use Topics  . Alcohol use: No  . Drug use: No    Allergies as of 08/15/2019 - Review Complete 06/26/2019  Allergen Reaction Noted  . Tylenol [acetaminophen] Other (See Comments) 01/08/2011    Review of Systems:    All systems reviewed and negative except where noted in HPI.   Physical Exam:  There were no vitals taken for this visit. No LMP recorded. Patient has had a hysterectomy. Psych:  Alert and cooperative. Normal mood and affect. General:   Alert,  Well-developed, well-nourished, pleasant and cooperative in NAD Head:  Normocephalic and atraumatic. Eyes:  Sclera clear, no icterus.   Conjunctiva pink. Ears:  Normal auditory acuity. Lungs:  Respirations even and unlabored.  Clear throughout to auscultation.   No wheezes, crackles, or rhonchi. No acute distress. Heart:  Regular rate and rhythm; no murmurs, clicks, rubs, or gallops. Abdomen:  Normal bowel sounds.  No bruits.  Soft, non-tender and non-distended without masses, hepatosplenomegaly or hernias noted.  No guarding or rebound tenderness.    Neurologic:  Alert and oriented x3;  grossly normal neurologically. Psych:  Alert and cooperative. Normal mood and affect.  Imaging Studies: No results found.  Assessment and Plan:   NIRVANA BLANCHETT is a 86 y.o. y/o female has been referred for GERD while on PPI and famotidine with breakthrough nausea and belching.  Her main symptom today is early morning nausea and also from a history she seems to suffer from constipation.  Her symptoms of constipation and nausea correlate.  If she has  not had a bowel movement for a few days as worsening of nausea.  Plan 1.  Commence on MiraLAX 1 capful twice a day.  Aim to have at least 1 soft bowel movement per day.  I have advised her daughter-in-law to keep a watch and update [place].  I will give her a call in 7 to 10 days to check how she is doing.  If not doing well will increase the medication or change to a different agent.  If the nausea does not respond to treatment of constipation then will think of other options  Follow up in 7 to 10 days  Dr Jonathon Bellows MD,MRCP(U.K)

## 2019-08-17 DIAGNOSIS — R42 Dizziness and giddiness: Secondary | ICD-10-CM | POA: Diagnosis not present

## 2019-08-17 DIAGNOSIS — I10 Essential (primary) hypertension: Secondary | ICD-10-CM | POA: Diagnosis not present

## 2019-08-17 DIAGNOSIS — D649 Anemia, unspecified: Secondary | ICD-10-CM | POA: Diagnosis not present

## 2019-08-17 DIAGNOSIS — Z9181 History of falling: Secondary | ICD-10-CM | POA: Diagnosis not present

## 2019-08-17 DIAGNOSIS — R7303 Prediabetes: Secondary | ICD-10-CM | POA: Diagnosis not present

## 2019-08-17 DIAGNOSIS — K219 Gastro-esophageal reflux disease without esophagitis: Secondary | ICD-10-CM | POA: Diagnosis not present

## 2019-08-17 DIAGNOSIS — M1611 Unilateral primary osteoarthritis, right hip: Secondary | ICD-10-CM | POA: Diagnosis not present

## 2019-08-17 DIAGNOSIS — I25119 Atherosclerotic heart disease of native coronary artery with unspecified angina pectoris: Secondary | ICD-10-CM | POA: Diagnosis not present

## 2019-08-17 DIAGNOSIS — K589 Irritable bowel syndrome without diarrhea: Secondary | ICD-10-CM | POA: Diagnosis not present

## 2019-08-17 DIAGNOSIS — E785 Hyperlipidemia, unspecified: Secondary | ICD-10-CM | POA: Diagnosis not present

## 2019-08-25 DIAGNOSIS — R42 Dizziness and giddiness: Secondary | ICD-10-CM | POA: Diagnosis not present

## 2019-08-25 DIAGNOSIS — E785 Hyperlipidemia, unspecified: Secondary | ICD-10-CM | POA: Diagnosis not present

## 2019-08-25 DIAGNOSIS — K219 Gastro-esophageal reflux disease without esophagitis: Secondary | ICD-10-CM | POA: Diagnosis not present

## 2019-08-25 DIAGNOSIS — K589 Irritable bowel syndrome without diarrhea: Secondary | ICD-10-CM | POA: Diagnosis not present

## 2019-08-25 DIAGNOSIS — D649 Anemia, unspecified: Secondary | ICD-10-CM | POA: Diagnosis not present

## 2019-08-25 DIAGNOSIS — I10 Essential (primary) hypertension: Secondary | ICD-10-CM | POA: Diagnosis not present

## 2019-08-25 DIAGNOSIS — R7303 Prediabetes: Secondary | ICD-10-CM | POA: Diagnosis not present

## 2019-08-25 DIAGNOSIS — Z9181 History of falling: Secondary | ICD-10-CM | POA: Diagnosis not present

## 2019-08-25 DIAGNOSIS — I25119 Atherosclerotic heart disease of native coronary artery with unspecified angina pectoris: Secondary | ICD-10-CM | POA: Diagnosis not present

## 2019-08-25 DIAGNOSIS — M1611 Unilateral primary osteoarthritis, right hip: Secondary | ICD-10-CM | POA: Diagnosis not present

## 2019-08-31 ENCOUNTER — Other Ambulatory Visit: Payer: Self-pay

## 2019-08-31 ENCOUNTER — Ambulatory Visit (INDEPENDENT_AMBULATORY_CARE_PROVIDER_SITE_OTHER): Payer: Medicare Other | Admitting: Primary Care

## 2019-08-31 ENCOUNTER — Ambulatory Visit (INDEPENDENT_AMBULATORY_CARE_PROVIDER_SITE_OTHER)
Admission: RE | Admit: 2019-08-31 | Discharge: 2019-08-31 | Disposition: A | Discharge status: Home / Self Care | Payer: Medicare Other | Source: Ambulatory Visit | Attending: Primary Care | Admitting: Primary Care

## 2019-08-31 ENCOUNTER — Encounter: Payer: Self-pay | Admitting: Primary Care

## 2019-08-31 VITALS — BP 118/72 | HR 64 | Temp 96.9°F | Ht 60.0 in | Wt 143.0 lb

## 2019-08-31 DIAGNOSIS — M7989 Other specified soft tissue disorders: Secondary | ICD-10-CM | POA: Diagnosis not present

## 2019-08-31 DIAGNOSIS — M25562 Pain in left knee: Secondary | ICD-10-CM

## 2019-08-31 DIAGNOSIS — G8929 Other chronic pain: Secondary | ICD-10-CM

## 2019-08-31 DIAGNOSIS — I70202 Unspecified atherosclerosis of native arteries of extremities, left leg: Secondary | ICD-10-CM | POA: Diagnosis not present

## 2019-08-31 DIAGNOSIS — M79604 Pain in right leg: Secondary | ICD-10-CM

## 2019-08-31 MED ORDER — GABAPENTIN 100 MG PO CAPS: ORAL_CAPSULE | ORAL | 0 refills | Status: DC

## 2019-08-31 NOTE — Patient Instructions (Signed)
We've increased the dose of your gabapentin to 1 capsule twice daily for pain.   Complete xray(s) prior to leaving today. I will notify you of your results once received.  It was a pleasure to see you today!

## 2019-08-31 NOTE — Assessment & Plan Note (Signed)
Acute on chronic flare, mild inflammation today. No trama. No evidence of septic joint or infection.  Suspect bursitis/arthritis. She is sedentary during most of her day, discussed to work on walking and endurance.   Start with plain films today. Increase gabapentin to BID, this has helped with pain. Consider PT vs sports medicine evaluation, await results.

## 2019-08-31 NOTE — Progress Notes (Signed)
Subjective:    Patient ID: Cindy Brandt, female    DOB: [birth year], 86 y.o.   MRN: [id number]  HPI  This visit occurred during the SARS-CoV-2 public health emergency.  Safety protocols were in place, including screening questions prior to the visit, additional usage of staff PPE, and extensive cleaning of exam room while observing appropriate contact time as indicated for disinfecting solutions.   Cindy Brandt is an 86 year old female with a history of hypertension, CAD, osteoarthritis, hyperlipidemia who presents today with a chief complaint of knee pain.   Her pain is located to the left anterior knee which is chronic for years, but worse over the last 2-3 months. She's noticed some mild swelling to the medial side of her knee. She denies trauma/injury, falls. She had been working with physical therapy for overall strength and endurance, but pain had been present prior to this. She was told by her physical therapist that she may need a steroid injection.   She's applying Aspercreme and Voltaren Gel with temporary improvement. She has noticed slight improvement since resuming gabapentin once daily, she is requesting a refill. Gabapentin does not cause significant drowsiness.   Review of Systems  Musculoskeletal: Positive for arthralgias and joint swelling.  Skin: Negative for color change.       Past Medical History:  Diagnosis Date  . Allergy   . Anemia   . Anxiety   . Arthritis   . Blood transfusion without reported diagnosis   . Cataract    a. bilerteral cataracts removed  . Coronary atherosclerosis of native coronary artery    a. 2003 Cath/unsuccessful PCI of occluded RCA;  b. 2005 NSTEMI/Cath: CTO RCA w/ L->R collats, LAD 20, LCX 16m, nl EF;  c. 11/2011 Myoview: no ischemia.  . Diastolic dysfunction 88/41/6606   a. 11/2011 Echo: Ejection fraction 60-65%, gr1 DD, basal inferior AK;  b. 09/2013 Echo: EF 55-60%, mild LVH, no rwma, Gr1 DD, mildly dil LA.  . Gastritis    a. 04/2016 EGD:  Nl esophagus, gastritis, nl duodenal bulb & 2nd portion of the duodenum.  Marland Kitchen Headache(784.0)   . Hyperlipidemia   . Hypertension   . Hypokalemia   . Tubular adenoma of colon 2013     Social History   Socioeconomic History  . Marital status: Widowed    Spouse name: Not on file  . Number of children: 5  . Years of education: Not on file  . Highest education level: Not on file  Occupational History  . Occupation: Retired  Tobacco Use  . Smoking status: Former Smoker    Packs/day: 0.50    Years: 25.00    Pack years: 12.50    Types: Cigarettes    Quit date: 04/06/1996    Years since quitting: 23.4  . Smokeless tobacco: Never Used  Substance and Sexual Activity  . Alcohol use: No  . Drug use: No  . Sexual activity: Not on file  Other Topics Concern  . Not on file  Social History Narrative   Currently living with dtr in [place].  No regular exercise.   Social Determinants of Health   Financial Resource Strain:   . Difficulty of Paying Living Expenses:   Food Insecurity:   . Worried About Charity fundraiser in the Last Year:   . Arboriculturist in the Last Year:   Transportation Needs:   . Film/video editor (Medical):   Marland Kitchen Lack of Transportation (Non-Medical):   Physical Activity:   .  Days of Exercise per Week:   . Minutes of Exercise per Session:   Stress:   . Feeling of Stress :   Social Connections:   . Frequency of Communication with Friends and Family:   . Frequency of Social Gatherings with Friends and Family:   . Attends Religious Services:   . Active Member of Clubs or Organizations:   . Attends Archivist Meetings:   Marland Kitchen Marital Status:   Intimate Partner Violence:   . Fear of Current or Ex-Partner:   . Emotionally Abused:   Marland Kitchen Physically Abused:   . Sexually Abused:     Past Surgical History:  Procedure Laterality Date  . CARPAL TUNNEL RELEASE Bilateral   . CHOLECYSTECTOMY  2004  . COLONOSCOPY  08/06/2011   Procedure: COLONOSCOPY;  Surgeon:  [name], MD;  [facility];  Service: Endoscopy;  Laterality: N/A;  10:40 AM  . CORONARY ANGIOGRAPHY N/A 08/02/2017   Procedure: CORONARY ANGIOGRAPHY;  Surgeon: Lorretta Harp, MD;  Location: [facility];  Service: Cardiovascular;  Laterality: N/A;  . GALLBLADDER SURGERY    . LEFT HEART CATH AND CORONARY ANGIOGRAPHY N/A 06/15/2016   Procedure: Left Heart Cath and Coronary Angiography;  Surgeon: Lorretta Harp, MD;  Location: [facility];  Service: Cardiovascular;  Laterality: N/A;  . PARTIAL HYSTERECTOMY    . repair of belly button    . UMBILICAL HERNIA REPAIR     Umbilical hernia repair as a child    Family History  Problem Relation Age of Onset  . Coronary artery disease Father   . Heart attack Father   . Colon cancer Father   . Coronary artery disease Mother   . Stomach cancer Mother   . Heart disease Brother   . Diabetes Brother   . Pancreatic cancer Neg Hx   . Rectal cancer Neg Hx   . Esophageal cancer Neg Hx     Allergies  Allergen Reactions  . Tylenol [Acetaminophen] Other (See Comments)    Funny feeling in chest.     Current Outpatient Medications on File Prior to Visit  Medication Sig Dispense Refill  . amLODipine (NORVASC) 2.5 MG tablet Take 1 tablet (2.5 mg total) by mouth daily. 90 tablet 1  . aspirin EC 81 MG tablet Take 81 mg by mouth every morning.    Marland Kitchen atenolol (TENORMIN) 25 MG tablet Take 1 tablet (25 mg total) by mouth daily. 90 tablet 2  . atorvastatin (LIPITOR) 80 MG tablet TAKE 1 TABLET BY MOUTH EVERY DAY IN THE EVENING FOR CHOLESTEROL 90 tablet 2  . busPIRone (BUSPAR) 7.5 MG tablet Take 1 tablet by mouth twice daily for anxiety. 180 tablet 3  . calcium carbonate (CALCIUM 600) 600 MG TABS tablet Take 1,200 mg by mouth daily.    . cetirizine (ZYRTEC) 10 MG tablet Take 10 mg by mouth daily.    . clopidogrel (PLAVIX) 75 MG tablet Take 1 tablet (75 mg total) by mouth daily. 90 tablet 3  . famotidine (PEPCID) 20 MG tablet  Take 20 mg by mouth 2 (two) times daily.    . isosorbide mononitrate (IMDUR) 120 MG 24 hr tablet TAKE 1 TABLET BY MOUTH EVERY DAY 90 tablet 0  . isosorbide mononitrate (IMDUR) 120 MG 24 hr tablet Take 1 tablet (120 mg total) by mouth daily. PT OVERDUE FOR OV PLEASE CALL FOR APPT 90 tablet 2  . Magnesium 200 MG TABS Take 1 tablet (200 mg total) by  mouth daily. 30 tablet 11  . Melatonin 5 MG TABS Take 5 mg by mouth daily as needed (sleep).    . mirabegron ER (MYRBETRIQ) 25 MG TB24 tablet Take 1 tablet (25 mg total) by mouth daily. For bladder control. 90 tablet 0  . nitroGLYCERIN (NITROSTAT) 0.4 MG SL tablet PLACE 1 TABLET UNDER THE TONGUE EVERY 5 MIN AS NEEDED FOR CHEST PAIN 25 tablet 11  . pantoprazole (PROTONIX) 40 MG tablet Take 1 tablet by mouth once daily for heartburn. 90 tablet 3  . ranolazine (RANEXA) 500 MG 12 hr tablet Take 1 tablet (500 mg total) by mouth 2 (two) times daily. 180 tablet 2  . sertraline (ZOLOFT) 100 MG tablet Take 1 tablet by mouth once daily for depression/anxiety. 90 tablet 1  . spironolactone (ALDACTONE) 25 MG tablet Take 0.5 tablets (12.5 mg total) by mouth daily. 45 tablet 2   No current facility-administered medications on file prior to visit.    BP 118/72   Pulse 64   Temp (!) 96.9 F (36.1 C) (Temporal)   Ht 5' (1.524 m)   Wt 143 lb (64.9 kg)   SpO2 98%   BMI 27.93 kg/m    Objective:   Physical Exam Musculoskeletal:     Left knee: Swelling present. No erythema or bony tenderness. Normal range of motion. No tenderness.       Legs:     Comments: Mild swelling to left medial knee, non tender. No erythema. Able to stand today, does ambulate at home some.   Skin:    General: Skin is warm and dry.     Findings: No erythema.  Neurological:     Mental Status: She is alert and oriented to person, place, and time.            Assessment & Plan:

## 2019-09-11 ENCOUNTER — Ambulatory Visit: Payer: Medicare Other | Admitting: Family Medicine

## 2019-09-14 ENCOUNTER — Encounter: Payer: Self-pay | Admitting: Family Medicine

## 2019-09-14 ENCOUNTER — Other Ambulatory Visit: Payer: Self-pay

## 2019-09-14 ENCOUNTER — Ambulatory Visit (INDEPENDENT_AMBULATORY_CARE_PROVIDER_SITE_OTHER): Payer: Medicare Other | Admitting: Family Medicine

## 2019-09-14 VITALS — BP 128/74 | HR 48 | Temp 98.0°F | Ht 60.0 in | Wt 142.5 lb

## 2019-09-14 DIAGNOSIS — Z9861 Coronary angioplasty status: Secondary | ICD-10-CM

## 2019-09-14 DIAGNOSIS — M1712 Unilateral primary osteoarthritis, left knee: Secondary | ICD-10-CM | POA: Diagnosis not present

## 2019-09-14 DIAGNOSIS — Z7902 Long term (current) use of antithrombotics/antiplatelets: Secondary | ICD-10-CM | POA: Diagnosis not present

## 2019-09-14 DIAGNOSIS — M958 Other specified acquired deformities of musculoskeletal system: Secondary | ICD-10-CM | POA: Diagnosis not present

## 2019-09-14 DIAGNOSIS — I2510 Atherosclerotic heart disease of native coronary artery without angina pectoris: Secondary | ICD-10-CM

## 2019-09-14 DIAGNOSIS — I214 Non-ST elevation (NSTEMI) myocardial infarction: Secondary | ICD-10-CM

## 2019-09-14 MED ORDER — METHYLPREDNISOLONE ACETATE 40 MG/ML IJ SUSP
80.0000 mg | Freq: Once | INTRAMUSCULAR | Status: AC
  Administered 2019-09-14: 80 mg via INTRA_ARTICULAR

## 2019-09-14 NOTE — Progress Notes (Signed)
Cindy Bagg T. Able Malloy, MD, [place]  Primary Care and Sports Medicine [facility] at [facility] [place], [address]  Phone: [phone]  FAX: [phone]  Cindy Brandt - 86 y.o. female  MRN [id number]  Date of Birth: Feb 04, 1932  Date: 09/14/2019  PCP: [name], NP  Referral: [name], NP  Chief Complaint  Patient presents with  . Knee Pain    Left    This visit occurred during the SARS-CoV-2 public health emergency.  Safety protocols were in place, including screening questions prior to the visit, additional usage of staff PPE, and extensive cleaning of exam room while observing appropriate contact time as indicated for disinfecting solutions.   Subjective:   Cindy Brandt is a 86 y.o. very pleasant female patient with Body mass index is 27.83 kg/m. who presents with the following:  L knee pain:  Has been bothering her a lot and she has a brace and she has had some chronic knee pain, but this is gotten quite a worse over the last few months.  L knee saw Dr. Aline Brochure up years ago.   At this point she is minimally active limited by her coronary disease.  She does use a wheelchair when she is out of the house with her family, but this is due to chest pain and not limited by her knee pain.  She does have pain medially as well as laterally in the knee.  The radiological images were independently reviewed by myself in the office and results were reviewed with the patient. My independent interpretation of images: These were ordered by Mrs. Clark on August 31, 2019.  She has notable tricompartmental osteoarthritis.  She also has some significant cortical irregularity on the lateral greater than the medial femoral condyles.  There is also some subchondral sclerosis and opacity greater in this region.  There also does appear to be at least 1 loose body in the knee.  This would be most consistent with clinically of OCD  medially and laterally. Electronically Signed  By: Owens Loffler, MD On: 09/14/2019 11:20 AM EDT   There is no significant trauma.  She does have a complex medical history with a history of MI, coronary disease limiting her function as well as chronic Plavix use.  Review of Systems is noted in the HPI, as appropriate   Objective:   BP 128/74   Pulse (!) 48 Comment: Irregular  Temp 98 F (36.7 C) (Temporal)   Ht 5' (1.524 m)   Wt 142 lb 8 oz (64.6 kg)   SpO2 92%   BMI 27.83 kg/m    GEN: No acute distress; alert,appropriate. PULM: Breathing comfortably in no respiratory distress PSYCH: Normally interactive.    Examined in the wheelchair. Left knee: Grossly full extension and flexion to 100 degrees.  Mild effusion.  ACL, PCL, MCL, and LCL are intact, she does have pain at the medial and lateral femoral condyles as well as the joint lines.  Difficult to assess which is worse pain.  No significant pain with patellar compression.  Radiology: DG Knee 4 Views W/Patella Left  Result Date: 08/31/2019 CLINICAL DATA:  Acute on chronic knee pain, left knee pain and swelling EXAM: LEFT KNEE - COMPLETE 4+ VIEW COMPARISON:  None. FINDINGS: Standing upright frontal weight-bearing views of both knees as well as tunnel view, lateral view, and sunrise views of the left knee are obtained. No gross abnormalities on limited evaluation of the  right knee. On the left, there irregularities with subchondral sclerosis along the articular margins of the bilateral femoral condyles, compatible with large osteochondral defects. The amount of sclerosis is increased above what is typically seen for degenerative change, and superimposed bone infarcts cannot be excluded. There is no joint effusion. No acute fracture. Extensive atherosclerosis of the bilateral lower extremity arterial structures is noted. IMPRESSION: 1. Marked irregularity of the articular margins of the left medial and lateral femoral condyles, consistent  with osteochondral defects. Underlying sclerosis may be reactive or related to bone infarct. 2. No acute fractures. 3. Extensive atherosclerosis. Electronically Signed   By: Randa Ngo M.D.   On: 08/31/2019 23:04    Assessment and Plan:     ICD-10-CM   1. Osteochondral defect of condyle of femur  M95.8   2. Primary osteoarthritis of left knee  M17.12 methylPREDNISolone acetate (DEPO-MEDROL) injection 80 mg  3. CAD S/P percutaneous coronary angioplasty  I25.10    Z98.61   4. NSTEMI (non-ST elevated myocardial infarction) ([place])  I21.4   5. Platelet inhibition due to Plavix  O96.29    Complicated knee case in the setting of complex medical patient with limiting coronary disease that limits her function overall and chronically on Plavix.  Fairly long discussion with the patient and her daughter about her knee.  There is clearly evidence of some extensive sclerosis in the distal femur.  To be there are what appear to be obvious OCD lesions, and there also is at least one loose body in the knee.  I agree with radiology, and bone infarctions cannot fully be excluded.  In her case where she is fairly limited and 86 years old with advanced coronary disease, doing some supportive measures to help with her function and pain management is appropriate.  She is limited in terms of her medication that she can possibly take.  We did talk about how this would be addressed more aggressively in a younger more active patient, but under no circumstances should she can think about any form of surgery.  Today I am going to do a Depo-Medrol injection in her left knee to give her some symptomatic relief.  Follow-up: No follow-ups on file.  Meds ordered this encounter  Medications  . methylPREDNISolone acetate (DEPO-MEDROL) injection 80 mg   There are no discontinued medications. No orders of the defined types were placed in this encounter.   Signed,  Maud Deed. Nathania Waldman, MD   Outpatient Encounter  Medications as of 09/14/2019  Medication Sig  . amLODipine (NORVASC) 2.5 MG tablet Take 1 tablet (2.5 mg total) by mouth daily.  Marland Kitchen aspirin EC 81 MG tablet Take 81 mg by mouth every morning.  Marland Kitchen atenolol (TENORMIN) 25 MG tablet Take 1 tablet (25 mg total) by mouth daily.  Marland Kitchen atorvastatin (LIPITOR) 80 MG tablet TAKE 1 TABLET BY MOUTH EVERY DAY IN THE EVENING FOR CHOLESTEROL  . busPIRone (BUSPAR) 7.5 MG tablet Take 1 tablet by mouth twice daily for anxiety.  . calcium carbonate (CALCIUM 600) 600 MG TABS tablet Take 1,200 mg by mouth daily.  . cetirizine (ZYRTEC) 10 MG tablet Take 10 mg by mouth daily.  . clopidogrel (PLAVIX) 75 MG tablet Take 1 tablet (75 mg total) by mouth daily.  . famotidine (PEPCID) 20 MG tablet Take 20 mg by mouth 2 (two) times daily.  Marland Kitchen gabapentin (NEURONTIN) 100 MG capsule Take 1 capsule by mouth twice daily for pain.  . isosorbide mononitrate (IMDUR) 120 MG 24 hr tablet TAKE  1 TABLET BY MOUTH EVERY DAY  . isosorbide mononitrate (IMDUR) 120 MG 24 hr tablet Take 1 tablet (120 mg total) by mouth daily. PT OVERDUE FOR OV PLEASE CALL FOR APPT  . Magnesium 200 MG TABS Take 1 tablet (200 mg total) by mouth daily.  . Melatonin 5 MG TABS Take 5 mg by mouth daily as needed (sleep).  . mirabegron ER (MYRBETRIQ) 25 MG TB24 tablet Take 1 tablet (25 mg total) by mouth daily. For bladder control.  . nitroGLYCERIN (NITROSTAT) 0.4 MG SL tablet PLACE 1 TABLET UNDER THE TONGUE EVERY 5 MIN AS NEEDED FOR CHEST PAIN  . pantoprazole (PROTONIX) 40 MG tablet Take 1 tablet by mouth once daily for heartburn.  . ranolazine (RANEXA) 500 MG 12 hr tablet Take 1 tablet (500 mg total) by mouth 2 (two) times daily.  . sertraline (ZOLOFT) 100 MG tablet Take 1 tablet by mouth once daily for depression/anxiety.  Marland Kitchen spironolactone (ALDACTONE) 25 MG tablet Take 0.5 tablets (12.5 mg total) by mouth daily.  . [EXPIRED] methylPREDNISolone acetate (DEPO-MEDROL) injection 80 mg    No facility-administered encounter  medications on file as of 09/14/2019.

## 2019-09-15 ENCOUNTER — Other Ambulatory Visit: Payer: Self-pay | Admitting: Cardiology

## 2019-09-15 NOTE — Telephone Encounter (Signed)
Rx(s) sent to pharmacy electronically.  

## 2019-09-17 ENCOUNTER — Other Ambulatory Visit: Payer: Self-pay | Admitting: Primary Care

## 2019-09-17 DIAGNOSIS — F411 Generalized anxiety disorder: Secondary | ICD-10-CM

## 2019-10-09 ENCOUNTER — Ambulatory Visit
Admission: EM | Admit: 2019-10-09 | Discharge: 2019-10-09 | Disposition: A | Discharge status: Home / Self Care | Payer: Medicare Other | Attending: Emergency Medicine | Admitting: Emergency Medicine

## 2019-10-09 ENCOUNTER — Other Ambulatory Visit: Payer: Self-pay

## 2019-10-09 ENCOUNTER — Ambulatory Visit (INDEPENDENT_AMBULATORY_CARE_PROVIDER_SITE_OTHER): Payer: Medicare Other

## 2019-10-09 DIAGNOSIS — J209 Acute bronchitis, unspecified: Secondary | ICD-10-CM | POA: Diagnosis not present

## 2019-10-09 DIAGNOSIS — R05 Cough: Secondary | ICD-10-CM

## 2019-10-09 DIAGNOSIS — R0602 Shortness of breath: Secondary | ICD-10-CM | POA: Diagnosis not present

## 2019-10-09 MED ORDER — DOXYCYCLINE HYCLATE 100 MG PO CAPS
100.0000 mg | ORAL_CAPSULE | Freq: Two times a day (BID) | ORAL | 0 refills | Status: DC
Start: 2019-10-09 — End: 2019-10-27

## 2019-10-09 MED ORDER — PREDNISONE 20 MG PO TABS
20.0000 mg | ORAL_TABLET | Freq: Every day | ORAL | 0 refills | Status: DC
Start: 2019-10-09 — End: 2019-10-27

## 2019-10-09 MED ORDER — BENZONATATE 100 MG PO CAPS
100.0000 mg | ORAL_CAPSULE | Freq: Three times a day (TID) | ORAL | 0 refills | Status: DC
Start: 2019-10-09 — End: 2019-10-27

## 2019-10-09 MED ORDER — ALBUTEROL SULFATE HFA 108 (90 BASE) MCG/ACT IN AERS
2.0000 | INHALATION_SPRAY | RESPIRATORY_TRACT | 0 refills | Status: DC | PRN
Start: 2019-10-09 — End: 2020-01-04

## 2019-10-09 MED ORDER — AEROCHAMBER PLUS FLO-VU MEDIUM MISC: 1.0000 | Freq: Once | 0 refills | Status: AC

## 2019-10-09 NOTE — ED Triage Notes (Signed)
Pt c/o productive cough, chest tightness and SOB x2 wks. States took OTC cough medicine with no relief.

## 2019-10-09 NOTE — ED Provider Notes (Signed)
[name]    CSN: [id number] Arrival date & time: 10/09/19  1026      History   Chief Complaint Chief Complaint  Patient presents with  . Cough    HPI Cindy Brandt is a 86 y.o. female with extensive medical history as outlined below presenting for 2-week course of cough.  Patient provides history: Endorsing productive cough without blood.  Does endorse chest tightness and increased dyspnea.  No lower leg swelling, palpitations, chest pain, fever.  Denies known sick contacts.  Is Covid vaccinated.  No inhalers at home.  Lives with her daughter and son-in-law.   Past Medical History:  Diagnosis Date  . Allergy   . Anemia   . Anxiety   . Arthritis   . Blood transfusion without reported diagnosis   . Cataract    a. bilerteral cataracts removed  . Coronary atherosclerosis of native coronary artery    a. 2003 Cath/unsuccessful PCI of occluded RCA;  b. 2005 NSTEMI/Cath: CTO RCA w/ L->R collats, LAD 20, LCX 35m, nl EF;  c. 11/2011 Myoview: no ischemia.  . Diastolic dysfunction 27/08/2374   a. 11/2011 Echo: Ejection fraction 60-65%, gr1 DD, basal inferior AK;  b. 09/2013 Echo: EF 55-60%, mild LVH, no rwma, Gr1 DD, mildly dil LA.  . Gastritis    a. 04/2016 EGD: Nl esophagus, gastritis, nl duodenal bulb & 2nd portion of the duodenum.  Marland Kitchen Headache(784.0)   . Hyperlipidemia   . Hypertension   . Hypokalemia   . Tubular adenoma of colon 2013    Patient Active Problem List   Diagnosis Date Noted  . Pain of right lower extremity 06/26/2019  . Dizziness 06/26/2019  . Herpes zoster without complication 28/31/5176  . Angular cheilitis 05/04/2018  . Hemorrhoids 04/11/2018  . Lower abdominal pain 04/11/2018  . Osteoporosis 11/15/2017  . Prediabetes 11/15/2017  . Presbycusis of both ears 11/16/2016  . Anemia 06/20/2016  . Dyspnea   . NSTEMI (non-ST elevated myocardial infarction) ([place]) 06/14/2016  . Unstable angina ([place]) 06/14/2016  . Hx of adenomatous colonic polyps  04/06/2016  . GERD (gastroesophageal reflux disease) 10/29/2015  . IBS (irritable bowel syndrome) 10/29/2015  . Urinary incontinence 10/29/2015  . Osteoarthritis 10/29/2015  . Diastolic dysfunction 16/08/3708  . Chest pain with moderate risk for cardiac etiology 11/23/2011  . Chronic pain of left knee 01/08/2011  . HYPOKALEMIA 07/31/2009  . Dyslipidemia, goal LDL below 70 05/22/2009  . Anxiety and depression 05/22/2009  . Essential hypertension 05/22/2009  . CAD S/P percutaneous coronary angioplasty 05/22/2009    Past Surgical History:  Procedure Laterality Date  . CARPAL TUNNEL RELEASE Bilateral   . CHOLECYSTECTOMY  2004  . COLONOSCOPY  08/06/2011   Procedure: COLONOSCOPY;  Surgeon: [name], MD;  Location: [facility];  Service: Endoscopy;  Laterality: N/A;  10:40 AM  . CORONARY ANGIOGRAPHY N/A 08/02/2017   Procedure: CORONARY ANGIOGRAPHY;  Surgeon: Lorretta Harp, MD;  Location: [facility];  Service: Cardiovascular;  Laterality: N/A;  . GALLBLADDER SURGERY    . LEFT HEART CATH AND CORONARY ANGIOGRAPHY N/A 06/15/2016   Procedure: Left Heart Cath and Coronary Angiography;  Surgeon: Lorretta Harp, MD;  Location: [facility];  Service: Cardiovascular;  Laterality: N/A;  . PARTIAL HYSTERECTOMY    . repair of belly button    . UMBILICAL HERNIA REPAIR     Umbilical hernia repair as a child    OB History   No obstetric history on file.  Home Medications    Prior to Admission medications   Medication Sig Start Date End Date Taking? Authorizing Provider  sertraline (ZOLOFT) 100 MG tablet TAKE 1 TABLET BY MOUTH ONCE DAILY FOR DEPRESSION/ANXIETY. 09/18/19   [name], NP  albuterol (VENTOLIN HFA) 108 (90 Base) MCG/ACT inhaler Inhale 2 puffs into the lungs every 4 (four) hours as needed for wheezing or shortness of breath. 10/09/19   Hall-Potvin, [place], PA-C  amLODipine (NORVASC) 2.5 MG tablet Take 1 tablet (2.5 mg total) by mouth daily. 05/22/19    Lendon Colonel, NP  aspirin EC 81 MG tablet Take 81 mg by mouth every morning.    [provider]  atenolol (TENORMIN) 25 MG tablet Take 1 tablet (25 mg total) by mouth daily. 07/04/19   Erlene Quan, PA-C  atorvastatin (LIPITOR) 80 MG tablet TAKE 1 TABLET BY MOUTH EVERY DAY IN THE EVENING FOR CHOLESTEROL 02/16/19   [name], NP  benzonatate (TESSALON) 100 MG capsule Take 1 capsule (100 mg total) by mouth every 8 (eight) hours. 10/09/19   Hall-Potvin, [place], PA-C  busPIRone (BUSPAR) 7.5 MG tablet Take 1 tablet by mouth twice daily for anxiety. 12/05/18   [name], NP  calcium carbonate (CALCIUM 600) 600 MG TABS tablet Take 1,200 mg by mouth daily.    [provider]  cetirizine (ZYRTEC) 10 MG tablet Take 10 mg by mouth daily.    [provider]  clopidogrel (PLAVIX) 75 MG tablet Take 1 tablet (75 mg total) by mouth daily. 09/21/18   Erlene Quan, PA-C  doxycycline (VIBRAMYCIN) 100 MG capsule Take 1 capsule (100 mg total) by mouth 2 (two) times daily. 10/09/19   Hall-Potvin, [place], PA-C  famotidine (PEPCID) 20 MG tablet Take 20 mg by mouth 2 (two) times daily.    [provider]  gabapentin (NEURONTIN) 100 MG capsule Take 1 capsule by mouth twice daily for pain. 08/31/19   [name], NP  isosorbide mononitrate (IMDUR) 120 MG 24 hr tablet TAKE 1 TABLET BY MOUTH EVERY DAY 07/11/19   Erlene Quan, PA-C  isosorbide mononitrate (IMDUR) 120 MG 24 hr tablet Take 1 tablet (120 mg total) by mouth daily. PT OVERDUE FOR OV PLEASE CALL FOR APPT 08/10/19   Lorretta Harp, MD  Magnesium 200 MG TABS Take 1 tablet (200 mg total) by mouth daily. 05/30/19   Lendon Colonel, NP  Melatonin 5 MG TABS Take 5 mg by mouth daily as needed (sleep).    [provider]  mirabegron ER (MYRBETRIQ) 25 MG TB24 tablet Take 1 tablet (25 mg total) by mouth daily. For bladder control. 12/05/18   [name], NP  nitroGLYCERIN (NITROSTAT) 0.4 MG  SL tablet PLACE 1 TABLET UNDER THE TONGUE EVERY 5 MIN AS NEEDED FOR CHEST PAIN 08/01/19   Lorretta Harp, MD  pantoprazole (PROTONIX) 40 MG tablet Take 1 tablet by mouth once daily for heartburn. 12/05/18   [name], NP  predniSONE (DELTASONE) 20 MG tablet Take 1 tablet (20 mg total) by mouth daily. 10/09/19   Hall-Potvin, [place], PA-C  ranolazine (RANEXA) 500 MG 12 hr tablet Take 1 tablet (500 mg total) by mouth 2 (two) times daily. 06/22/19   Erlene Quan, PA-C  Spacer/Aero-Holding Chambers (AEROCHAMBER PLUS FLO-VU MEDIUM) MISC 1 each by Other route once for 1 dose. 10/09/19 10/09/19  Hall-Potvin, [place], PA-C  spironolactone (ALDACTONE) 25 MG tablet Take 0.5 tablets (12.5 mg total) by mouth daily. 09/15/19  Lorretta Harp, MD    Family History Family History  Problem Relation Age of Onset  . Coronary artery disease Father   . Heart attack Father   . Colon cancer Father   . Coronary artery disease Mother   . Stomach cancer Mother   . Heart disease Brother   . Diabetes Brother   . Pancreatic cancer Neg Hx   . Rectal cancer Neg Hx   . Esophageal cancer Neg Hx     Social History Social History   Tobacco Use  . Smoking status: Former Smoker    Packs/day: 0.50    Years: 25.00    Pack years: 12.50    Types: Cigarettes    Quit date: 04/06/1996    Years since quitting: 23.5  . Smokeless tobacco: Never Used  Substance Use Topics  . Alcohol use: No  . Drug use: No     Allergies   Patient has no known allergies.   Review of Systems As per HPI   Physical Exam Triage Vital Signs ED Triage Vitals [10/09/19 1127]  Enc Vitals Group     BP (!) 155/77     Pulse Rate 66     Resp 20     Temp 98.7 F (37.1 C)     Temp Source Oral     SpO2 91 %     Weight      Height      Head Circumference      Peak Flow      Pain Score 6     Pain Loc      Pain Edu?      Excl. in [place]?    No data found.  Updated Vital Signs BP (!) 155/77 (BP Location: Left Arm)   Pulse 66    Temp 98.7 F (37.1 C) (Oral)   Resp 20   SpO2 91%   Visual Acuity Right Eye Distance:   Left Eye Distance:   Bilateral Distance:    Right Eye Near:   Left Eye Near:    Bilateral Near:     Physical Exam Constitutional:      General: She is not in acute distress.    Appearance: She is obese. She is not ill-appearing or diaphoretic.  HENT:     Head: Normocephalic and atraumatic.     Mouth/Throat:     Mouth: Mucous membranes are moist.     Pharynx: Oropharynx is clear. No oropharyngeal exudate or posterior oropharyngeal erythema.  Eyes:     General: No scleral icterus.    Conjunctiva/sclera: Conjunctivae normal.     Pupils: Pupils are equal, round, and reactive to light.  Neck:     Comments: Trachea midline, negative JVD Cardiovascular:     Rate and Rhythm: Normal rate and regular rhythm.     Heart sounds: No murmur heard.  No gallop.   Pulmonary:     Effort: Pulmonary effort is normal. No respiratory distress.     Breath sounds: Wheezing and rhonchi present. No rales.     Comments: diffuse Musculoskeletal:     Cervical back: Neck supple. No tenderness.  Lymphadenopathy:     Cervical: No cervical adenopathy.  Skin:    Capillary Refill: Capillary refill takes less than 2 seconds.     Coloration: Skin is not jaundiced or pale.     Findings: No rash.  Neurological:     General: No focal deficit present.     Mental Status: She is alert and oriented to  person, place, and time.      UC Treatments / Results  Labs (all labs ordered are listed, but only abnormal results are displayed) Labs Reviewed - No data to display  EKG   Radiology DG Chest 2 View  Result Date: 10/09/2019 CLINICAL DATA:  Cough and shortness of breath. EXAM: CHEST - 2 VIEW COMPARISON:  07/02/2016 FINDINGS: The cardiac silhouette, mediastinal and hilar contours are within normal limits and stable. Stable tortuosity and calcification of the thoracic aorta. Mild chronic bronchitic type changes but  no acute overlying pulmonary process. No pleural effusion. No pulmonary lesions. The bony thorax is intact. Stable degenerative changes involving the thoracic spine. IMPRESSION: Chronic lung changes but no acute pulmonary findings. Electronically Signed   By: Marijo Sanes M.D.   On: 10/09/2019 12:10    Procedures Procedures (including critical care time)  Medications Ordered in UC Medications - No data to display  Initial Impression / Assessment and Plan / UC Course  I have reviewed the triage vital signs and the nursing notes.  Pertinent labs & imaging results that were available during my care of the patient were reviewed by me and considered in my medical decision making (see chart for details).     Patient afebrile, nontoxic, without acute respiratory distress.  X-ray done office, reviewed by me radiology: Significant for chronic lung changes without acute pulmonary findings.  Given patient's age, comorbidities, cardiopulmonary exam, will start doxycycline for bronchitis.  Will defer Covid testing given duration of symptoms.  Return precautions discussed, pt & daughter-in-law verbalized understanding and are agreeable to plan. Final Clinical Impressions(s) / UC Diagnoses   Final diagnoses:  Acute bronchitis, unspecified organism     Discharge Instructions     Tessalon for cough. Start flonase, atrovent nasal spray for nasal congestion/drainage. You can use over the counter nasal saline rinse such as neti pot for nasal congestion. Keep hydrated, your urine should be clear to pale yellow in color. Tylenol/motrin for fever and pain. Monitor for any worsening of symptoms, chest pain, shortness of breath, wheezing, swelling of the throat, go to the emergency department for further evaluation needed.     ED Prescriptions    Medication Sig Dispense Auth. Provider   predniSONE (DELTASONE) 20 MG tablet Take 1 tablet (20 mg total) by mouth daily. 5 tablet Hall-Potvin, [place], PA-C    doxycycline (VIBRAMYCIN) 100 MG capsule Take 1 capsule (100 mg total) by mouth 2 (two) times daily. 20 capsule Hall-Potvin, [place], PA-C   albuterol (VENTOLIN HFA) 108 (90 Base) MCG/ACT inhaler Inhale 2 puffs into the lungs every 4 (four) hours as needed for wheezing or shortness of breath. 18 g Hall-Potvin, [place], PA-C   Spacer/Aero-Holding Chambers (AEROCHAMBER PLUS FLO-VU MEDIUM) MISC 1 each by Other route once for 1 dose. 1 each Hall-Potvin, [place], PA-C   benzonatate (TESSALON) 100 MG capsule Take 1 capsule (100 mg total) by mouth every 8 (eight) hours. 21 capsule Hall-Potvin, [place], PA-C     PDMP not reviewed this encounter.   Hall-Potvin, [place], [place] 10/09/19 1220

## 2019-10-09 NOTE — Discharge Instructions (Addendum)

## 2019-10-27 ENCOUNTER — Telehealth (INDEPENDENT_AMBULATORY_CARE_PROVIDER_SITE_OTHER): Payer: Medicare Other | Admitting: Primary Care

## 2019-10-27 ENCOUNTER — Ambulatory Visit (INDEPENDENT_AMBULATORY_CARE_PROVIDER_SITE_OTHER)
Admission: RE | Admit: 2019-10-27 | Discharge: 2019-10-27 | Disposition: A | Discharge status: Home / Self Care | Payer: Medicare Other | Source: Ambulatory Visit | Attending: Primary Care | Admitting: Primary Care

## 2019-10-27 ENCOUNTER — Encounter: Payer: Self-pay | Admitting: Primary Care

## 2019-10-27 ENCOUNTER — Other Ambulatory Visit: Payer: Self-pay

## 2019-10-27 ENCOUNTER — Other Ambulatory Visit: Payer: Self-pay | Admitting: Primary Care

## 2019-10-27 VITALS — BP 124/78 | HR 72 | Temp 98.3°F | Wt 139.0 lb

## 2019-10-27 DIAGNOSIS — R0989 Other specified symptoms and signs involving the circulatory and respiratory systems: Secondary | ICD-10-CM | POA: Diagnosis not present

## 2019-10-27 DIAGNOSIS — R059 Cough, unspecified: Secondary | ICD-10-CM

## 2019-10-27 DIAGNOSIS — R05 Cough: Secondary | ICD-10-CM

## 2019-10-27 LAB — CBC WITH DIFFERENTIAL/PLATELET
Basophils Absolute: 0 K/uL (ref 0.0–0.1)
Basophils Relative: 0.4 % (ref 0.0–3.0)
Eosinophils Absolute: 0.2 K/uL (ref 0.0–0.7)
Eosinophils Relative: 5.9 % — ABNORMAL HIGH (ref 0.0–5.0)
HCT: 37.5 % (ref 36.0–46.0)
Hemoglobin: 12.8 g/dL (ref 12.0–15.0)
Lymphocytes Relative: 27 % (ref 12.0–46.0)
Lymphs Abs: 1.1 K/uL (ref 0.7–4.0)
MCHC: 34.2 g/dL (ref 30.0–36.0)
MCV: 105.9 fl — ABNORMAL HIGH (ref 78.0–100.0)
Monocytes Absolute: 0.5 K/uL (ref 0.1–1.0)
Monocytes Relative: 10.8 % (ref 3.0–12.0)
Neutro Abs: 2.4 K/uL (ref 1.4–7.7)
Neutrophils Relative %: 55.9 % (ref 43.0–77.0)
Platelets: 229 K/uL (ref 150.0–400.0)
RBC: 3.54 Mil/uL — ABNORMAL LOW (ref 3.87–5.11)
RDW: 12.3 % (ref 11.5–15.5)
WBC: 4.2 K/uL (ref 4.0–10.5)

## 2019-10-27 LAB — BRAIN NATRIURETIC PEPTIDE: Pro B Natriuretic peptide (BNP): 97 pg/mL (ref 0.0–100.0)

## 2019-10-27 MED ORDER — GUAIFENESIN-CODEINE 100-10 MG/5ML PO SYRP: 5.0000 mL | ORAL_SOLUTION | Freq: Three times a day (TID) | ORAL | 0 refills | Status: DC | PRN

## 2019-10-27 NOTE — Assessment & Plan Note (Signed)
Acute congested cough x 4-6 weeks. Appears quite congested on exam today, has been treated with oral antibiotics and steroids without improvement in cough, but with improvement in dyspnea.   She doesn't appear toxic or sickly. Appears euvolemic.   Differentials include CAP, chronic bronchitis, CHF.  Repeat chest xray pending today. Checking cbc with diff and BNP.  Await results.

## 2019-10-27 NOTE — Progress Notes (Deleted)
   Subjective:    Patient ID: Cindy Brandt, female    DOB: 09/02/1931, 86 y.o.   MRN: [id number]  HPI    Review of Systems     Objective:   Physical Exam         Assessment & Plan:

## 2019-10-27 NOTE — Patient Instructions (Signed)
Stop by the lab and xray prior to leaving today.   I'll be in touch later today.  It was a pleasure to see you today!

## 2019-10-27 NOTE — Progress Notes (Signed)
Subjective:    Patient ID: Cindy Brandt, female    DOB: [birth year], 86 y.o.   MRN: [id number]  HPI  This visit occurred during the SARS-CoV-2 public health emergency.  Safety protocols were in place, including screening questions prior to the visit, additional usage of staff PPE, and extensive cleaning of exam room while observing appropriate contact time as indicated for disinfecting solutions.   Cindy Brandt is a 86 year old female with a history of hypertension, CAD, GERD, tobacco abuse (quit in 1998) who presents today with a chief complaint of cough and congestion.  She was evaluated at [facility] on 10/09/19 for a 2 week history of productive cough with chest tightness and dyspnea. Exam revealed wheezing and rhonchi. She underwent chest xray which demonstrated "chronic lung changes" but without acute findings. She was treated with Doxycycline, Flonase, Tessalon Perles, oral steroids and discharged home. She was not tested for Covid-19.   Since her UC visit she continues to experience a deep, congested cough with expulsion of clear sputum. She feels tired, fatigued. She is using her albuterol inhaler 1-2 times daily several days weekly, but her shortness of breath has overall improved. She denies fevers and family denies acute confusion or changes in mental status.   She's been vaccinated from [place]. She completed her course of Doxycycline, prednisone, and Tessalon Perles. She quit smoking in 1998.  Review of Systems  Constitutional: Positive for fatigue. Negative for chills and fever.  HENT: Positive for congestion. Negative for sore throat.   Respiratory: Positive for cough and shortness of breath.        Shortness of breath has improved  Cardiovascular: Negative for chest pain.  Psychiatric/Behavioral: Negative for confusion.       Past Medical History:  Diagnosis Date  . Allergy   . Anemia   . Anxiety   . Arthritis   . Blood transfusion without reported diagnosis     . Cataract    a. bilerteral cataracts removed  . Coronary atherosclerosis of native coronary artery    a. 2003 Cath/unsuccessful PCI of occluded RCA;  b. 2005 NSTEMI/Cath: CTO RCA w/ L->R collats, LAD 20, LCX 1m, nl EF;  c. 11/2011 Myoview: no ischemia.  . Diastolic dysfunction 48/54/6270   a. 11/2011 Echo: Ejection fraction 60-65%, gr1 DD, basal inferior AK;  b. 09/2013 Echo: EF 55-60%, mild LVH, no rwma, Gr1 DD, mildly dil LA.  . Gastritis    a. 04/2016 EGD: Nl esophagus, gastritis, nl duodenal bulb & 2nd portion of the duodenum.  Marland Kitchen Headache(784.0)   . Hyperlipidemia   . Hypertension   . Hypokalemia   . Tubular adenoma of colon 2013     Social History   Socioeconomic History  . Marital status: Widowed    Spouse name: Not on file  . Number of children: 5  . Years of education: Not on file  . Highest education level: Not on file  Occupational History  . Occupation: Retired  Tobacco Use  . Smoking status: Former Smoker    Packs/day: 0.50    Years: 25.00    Pack years: 12.50    Types: Cigarettes    Quit date: 04/06/1996    Years since quitting: 23.5  . Smokeless tobacco: Never Used  Substance and Sexual Activity  . Alcohol use: No  . Drug use: No  . Sexual activity: Not on file  Other Topics Concern  . Not on file  Social History Narrative   Currently living with  dtr in [place].  No regular exercise.   Social Determinants of Health   Financial Resource Strain:   . Difficulty of Paying Living Expenses: Not on file  Food Insecurity:   . Worried About Charity fundraiser in the Last Year: Not on file  . Ran Out of Food in the Last Year: Not on file  Transportation Needs:   . Lack of Transportation (Medical): Not on file  . Lack of Transportation (Non-Medical): Not on file  Physical Activity:   . Days of Exercise per Week: Not on file  . Minutes of Exercise per Session: Not on file  Stress:   . Feeling of Stress : Not on file  Social Connections:   . Frequency of  Communication with Friends and Family: Not on file  . Frequency of Social Gatherings with Friends and Family: Not on file  . Attends Religious Services: Not on file  . Active Member of Clubs or Organizations: Not on file  . Attends Archivist Meetings: Not on file  . Marital Status: Not on file  Intimate Partner Violence:   . Fear of Current or Ex-Partner: Not on file  . Emotionally Abused: Not on file  . Physically Abused: Not on file  . Sexually Abused: Not on file    Past Surgical History:  Procedure Laterality Date  . CARPAL TUNNEL RELEASE Bilateral   . CHOLECYSTECTOMY  2004  . COLONOSCOPY  08/06/2011   Procedure: COLONOSCOPY;  Surgeon: [name], MD;  Location: [facility];  Service: Endoscopy;  Laterality: N/A;  10:40 AM  . CORONARY ANGIOGRAPHY N/A 08/02/2017   Procedure: CORONARY ANGIOGRAPHY;  Surgeon: Lorretta Harp, MD;  Location: [place] [facility];  Service: Cardiovascular;  Laterality: N/A;  . GALLBLADDER SURGERY    . LEFT HEART CATH AND CORONARY ANGIOGRAPHY N/A 06/15/2016   Procedure: Left Heart Cath and Coronary Angiography;  Surgeon: Lorretta Harp, MD;  Location: [place] [facility];  Service: Cardiovascular;  Laterality: N/A;  . PARTIAL HYSTERECTOMY    . repair of belly button    . UMBILICAL HERNIA REPAIR     Umbilical hernia repair as a child    Family History  Problem Relation Age of Onset  . Coronary artery disease Father   . Heart attack Father   . Colon cancer Father   . Coronary artery disease Mother   . Stomach cancer Mother   . Heart disease Brother   . Diabetes Brother   . Pancreatic cancer Neg Hx   . Rectal cancer Neg Hx   . Esophageal cancer Neg Hx     No Known Allergies  Current Outpatient Medications on File Prior to Visit  Medication Sig Dispense Refill  . sertraline (ZOLOFT) 100 MG tablet TAKE 1 TABLET BY MOUTH ONCE DAILY FOR DEPRESSION/ANXIETY. 90 tablet 1  . albuterol (VENTOLIN HFA) 108 (90 Base) MCG/ACT inhaler Inhale  2 puffs into the lungs every 4 (four) hours as needed for wheezing or shortness of breath. 18 g 0  . amLODipine (NORVASC) 2.5 MG tablet Take 1 tablet (2.5 mg total) by mouth daily. 90 tablet 1  . aspirin EC 81 MG tablet Take 81 mg by mouth every morning.    Marland Kitchen atenolol (TENORMIN) 25 MG tablet Take 1 tablet (25 mg total) by mouth daily. 90 tablet 2  . atorvastatin (LIPITOR) 80 MG tablet TAKE 1 TABLET BY MOUTH EVERY DAY IN THE EVENING FOR CHOLESTEROL 90 tablet 2  . busPIRone (BUSPAR) 7.5  MG tablet Take 1 tablet by mouth twice daily for anxiety. 180 tablet 3  . calcium carbonate (CALCIUM 600) 600 MG TABS tablet Take 1,200 mg by mouth daily.    . cetirizine (ZYRTEC) 10 MG tablet Take 10 mg by mouth daily.    . clopidogrel (PLAVIX) 75 MG tablet Take 1 tablet (75 mg total) by mouth daily. 90 tablet 3  . famotidine (PEPCID) 20 MG tablet Take 20 mg by mouth 2 (two) times daily.    Marland Kitchen gabapentin (NEURONTIN) 100 MG capsule Take 1 capsule by mouth twice daily for pain. 180 capsule 0  . isosorbide mononitrate (IMDUR) 120 MG 24 hr tablet TAKE 1 TABLET BY MOUTH EVERY DAY 90 tablet 0  . isosorbide mononitrate (IMDUR) 120 MG 24 hr tablet Take 1 tablet (120 mg total) by mouth daily. PT OVERDUE FOR OV PLEASE CALL FOR APPT 90 tablet 2  . Magnesium 200 MG TABS Take 1 tablet (200 mg total) by mouth daily. 30 tablet 11  . Melatonin 5 MG TABS Take 5 mg by mouth daily as needed (sleep).    . mirabegron ER (MYRBETRIQ) 25 MG TB24 tablet Take 1 tablet (25 mg total) by mouth daily. For bladder control. 90 tablet 0  . nitroGLYCERIN (NITROSTAT) 0.4 MG SL tablet PLACE 1 TABLET UNDER THE TONGUE EVERY 5 MIN AS NEEDED FOR CHEST PAIN 25 tablet 11  . pantoprazole (PROTONIX) 40 MG tablet Take 1 tablet by mouth once daily for heartburn. 90 tablet 3  . ranolazine (RANEXA) 500 MG 12 hr tablet Take 1 tablet (500 mg total) by mouth 2 (two) times daily. 180 tablet 2  . spironolactone (ALDACTONE) 25 MG tablet Take 0.5 tablets (12.5 mg total)  by mouth daily. 45 tablet 3   No current facility-administered medications on file prior to visit.    BP 124/78   Pulse 72   Temp 98.3 F (36.8 C) (Temporal)   Wt 139 lb (63 kg)   SpO2 95%   BMI 27.15 kg/m    Objective:   Physical Exam Pulmonary:     Effort: Pulmonary effort is normal.     Breath sounds: No decreased air movement. Examination of the right-upper field reveals wheezing. Examination of the left-upper field reveals wheezing. Examination of the right-lower field reveals rhonchi. Examination of the left-lower field reveals rhonchi. Wheezing and rhonchi present. No decreased breath sounds.     Comments: Congested cough during exam  Neurological:     Mental Status: She is alert and oriented to person, place, and time.            Assessment & Plan:

## 2019-11-05 DIAGNOSIS — R059 Cough, unspecified: Secondary | ICD-10-CM

## 2019-11-07 DIAGNOSIS — H40033 Anatomical narrow angle, bilateral: Secondary | ICD-10-CM | POA: Diagnosis not present

## 2019-11-07 DIAGNOSIS — H43393 Other vitreous opacities, bilateral: Secondary | ICD-10-CM | POA: Diagnosis not present

## 2019-11-25 ENCOUNTER — Other Ambulatory Visit: Payer: Self-pay | Admitting: Adult Health

## 2019-11-28 ENCOUNTER — Other Ambulatory Visit: Payer: Self-pay

## 2019-11-28 ENCOUNTER — Encounter: Payer: Self-pay | Admitting: Cardiovascular Disease

## 2019-11-28 ENCOUNTER — Other Ambulatory Visit: Payer: Self-pay | Admitting: Primary Care

## 2019-11-28 ENCOUNTER — Ambulatory Visit: Payer: Medicare Other | Admitting: Cardiovascular Disease

## 2019-11-28 DIAGNOSIS — I2510 Atherosclerotic heart disease of native coronary artery without angina pectoris: Secondary | ICD-10-CM

## 2019-11-28 DIAGNOSIS — Z9861 Coronary angioplasty status: Secondary | ICD-10-CM

## 2019-11-28 DIAGNOSIS — I10 Essential (primary) hypertension: Secondary | ICD-10-CM | POA: Diagnosis not present

## 2019-11-28 DIAGNOSIS — M79604 Pain in right leg: Secondary | ICD-10-CM

## 2019-11-28 DIAGNOSIS — E785 Hyperlipidemia, unspecified: Secondary | ICD-10-CM | POA: Diagnosis not present

## 2019-11-28 MED ORDER — ISOSORBIDE MONONITRATE ER 60 MG PO TB24
90.0000 mg | ORAL_TABLET | Freq: Every day | ORAL | 1 refills | Status: DC
Start: 2019-11-28 — End: 2019-12-25

## 2019-11-28 MED ORDER — RANOLAZINE ER 500 MG PO TB12: 500.0000 mg | ORAL_TABLET | Freq: Two times a day (BID) | ORAL | 1 refills | Status: DC

## 2019-11-28 NOTE — Patient Instructions (Signed)
Medication Instructions:  Increase Imdur to 90  Start Renexa 500 mg twice daily   *If you need a refill on your cardiac medications before your next appointment, please call your pharmacy*   Follow-Up: At [facility], you and your health needs are our priority.  As part of our continuing mission to provide you with exceptional heart care, we have created designated Provider Care Teams.  These Care Teams include your primary Cardiologist (physician) and Advanced Practice Providers (APPs -  Physician Assistants and Nurse Practitioners) who all work together to provide you with the care you need, when you need it.  We recommend signing up for the patient portal called "MyChart".  Sign up information is provided on this After Visit Summary.  MyChart is used to connect with patients for Virtual Visits (Telemedicine).  Patients are able to view lab/test results, encounter notes, upcoming appointments, etc.  Non-urgent messages can be sent to your provider as well.   To learn more about what you can do with MyChart, go to [email or web address].    Your next appointment:   3 month(s)  The format for your next appointment:   In Person  Provider:   Quay Burow, MD

## 2019-11-28 NOTE — Assessment & Plan Note (Signed)
History of dyslipidemia on statin therapy with lipid profile performed 12/05/2018 revealing total cluster 118, LDL 51 and HDL 54.

## 2019-11-28 NOTE — Assessment & Plan Note (Signed)
History of essential hypertension a blood pressure measured today 124/72.  She is on amlodipine and atenolol.

## 2019-11-28 NOTE — Progress Notes (Signed)
11/28/2019 Cindy Brandt   1931-03-17  [id number]  Primary Physician [name], NP Primary Cardiologist: Lorretta Harp MD [name], [place]  HPI:  Cindy Brandt is a 86 y.o.  widowed African-American female mother of 2 childrenhe was accompanied by her daughter Cindy Brandt . I last saw her in the office  11/30/2018. she has a h/o CAD (CTO of RCA &nonobs LAD/LCX dzs - 2005), HTN, HL, Diast Dysfxn, hypokalemia, and gastritis (EGD 04/2016), who presented on transfer from [facility] for admission after presentation w/ nitrate responsive chest pain and mild troponin elevation. I performed cardiac catheterization on her 06/15/16 revealed an occluded RCA with left right collaterals, occluded nondominant circumflex, high-grade ostial ramus branch stenosis and 50% proximal LAD stenosis. She had normal LV function with grade 2 diastolic dysfunction. She is on optimal medical therapy. She does suffer from anxiety.She has nocturnal chest pain especially when she is recumbent with which sounds more like GERD. She is on an H2 antagonist.  She saw Kerin Ransom in the office 07/04/2019 complaining of increased nitroglycerin use.  He discussed beginning ranolazine but this test not appear to be on her med list.  She says over the last several months has had increased frequency of sublingual nitroglycerin utilization.  She is fairly sedentary, lives with her younger daughter, and never leaves the house.  There is also component of reflux.   Current Meds  Medication Sig  . albuterol (VENTOLIN HFA) 108 (90 Base) MCG/ACT inhaler Inhale 2 puffs into the lungs every 4 (four) hours as needed for wheezing or shortness of breath.  Marland Kitchen amLODipine (NORVASC) 2.5 MG tablet TAKE 1 TABLET BY MOUTH EVERY DAY  . aspirin EC 81 MG tablet Take 81 mg by mouth every morning.  Marland Kitchen atenolol (TENORMIN) 25 MG tablet Take 1 tablet (25 mg total) by mouth daily.  Marland Kitchen atorvastatin (LIPITOR) 80 MG tablet TAKE 1 TABLET BY MOUTH EVERY DAY IN  THE EVENING FOR CHOLESTEROL  . busPIRone (BUSPAR) 7.5 MG tablet Take 1 tablet by mouth twice daily for anxiety.  . calcium carbonate (CALCIUM 600) 600 MG TABS tablet Take 1,200 mg by mouth daily.  . cetirizine (ZYRTEC) 10 MG tablet Take 10 mg by mouth daily.  . clopidogrel (PLAVIX) 75 MG tablet Take 1 tablet (75 mg total) by mouth daily.  . famotidine (PEPCID) 20 MG tablet Take 20 mg by mouth daily.   Marland Kitchen gabapentin (NEURONTIN) 100 MG capsule Take 1 capsule by mouth twice daily for pain. (Patient taking differently: as needed. Take 1 capsule by mouth twice daily for pain as needed)  . isosorbide mononitrate (IMDUR) 60 MG 24 hr tablet Take 1.5 tablets (90 mg total) by mouth daily.  . Magnesium 200 MG TABS Take 1 tablet (200 mg total) by mouth daily. (Patient taking differently: Take 250 mg by mouth daily. )  . Melatonin 5 MG TABS Take 5 mg by mouth daily as needed (sleep).  . nitroGLYCERIN (NITROSTAT) 0.4 MG SL tablet PLACE 1 TABLET UNDER THE TONGUE EVERY 5 MIN AS NEEDED FOR CHEST PAIN  . pantoprazole (PROTONIX) 40 MG tablet Take 1 tablet by mouth once daily for heartburn.  . sertraline (ZOLOFT) 100 MG tablet TAKE 1 TABLET BY MOUTH ONCE DAILY FOR DEPRESSION/ANXIETY.  Marland Kitchen spironolactone (ALDACTONE) 25 MG tablet Take 0.5 tablets (12.5 mg total) by mouth daily.  . [DISCONTINUED] isosorbide mononitrate (IMDUR) 120 MG 24 hr tablet TAKE 1 TABLET BY MOUTH EVERY DAY (Patient taking differently: 60 mg.  Patient is taking half a tablet daily.)  . [DISCONTINUED] isosorbide mononitrate (IMDUR) 120 MG 24 hr tablet Take 1 tablet (120 mg total) by mouth daily. PT OVERDUE FOR OV PLEASE CALL FOR APPT     No Known Allergies  Social History   Socioeconomic History  . Marital status: Widowed    Spouse name: Not on file  . Number of children: 5  . Years of education: Not on file  . Highest education level: Not on file  Occupational History  . Occupation: Retired  Tobacco Use  . Smoking status: Former Smoker     Packs/day: 0.50    Years: 25.00    Pack years: 12.50    Types: Cigarettes    Quit date: 04/06/1996    Years since quitting: 23.6  . Smokeless tobacco: Never Used  Substance and Sexual Activity  . Alcohol use: No  . Drug use: No  . Sexual activity: Not on file  Other Topics Concern  . Not on file  Social History Narrative   Currently living with dtr in [place].  No regular exercise.   Social Determinants of Health   Financial Resource Strain:   . Difficulty of Paying Living Expenses: Not on file  Food Insecurity:   . Worried About Charity fundraiser in the Last Year: Not on file  . Ran Out of Food in the Last Year: Not on file  Transportation Needs:   . Lack of Transportation (Medical): Not on file  . Lack of Transportation (Non-Medical): Not on file  Physical Activity:   . Days of Exercise per Week: Not on file  . Minutes of Exercise per Session: Not on file  Stress:   . Feeling of Stress : Not on file  Social Connections:   . Frequency of Communication with Friends and Family: Not on file  . Frequency of Social Gatherings with Friends and Family: Not on file  . Attends Religious Services: Not on file  . Active Member of Clubs or Organizations: Not on file  . Attends Archivist Meetings: Not on file  . Marital Status: Not on file  Intimate Partner Violence:   . Fear of Current or Ex-Partner: Not on file  . Emotionally Abused: Not on file  . Physically Abused: Not on file  . Sexually Abused: Not on file     Review of Systems: General: negative for chills, fever, night sweats or weight changes.  Cardiovascular: negative for chest pain, dyspnea on exertion, edema, orthopnea, palpitations, paroxysmal nocturnal dyspnea or shortness of breath Dermatological: negative for rash Respiratory: negative for cough or wheezing Urologic: negative for hematuria Abdominal: negative for nausea, vomiting, diarrhea, bright red blood per rectum, melena, or hematemesis Neurologic:  negative for visual changes, syncope, or dizziness All other systems reviewed and are otherwise negative except as noted above.    Blood pressure 124/72, pulse 71, height 4\' 11"  (1.499 m), weight 141 lb 9.6 oz (64.2 kg), SpO2 93 %.  General appearance: alert and no distress Neck: no adenopathy, no carotid bruit, no JVD, supple, symmetrical, trachea midline and thyroid not enlarged, symmetric, no tenderness/mass/nodules Lungs: clear to auscultation bilaterally Heart: regular rate and rhythm, S1, S2 normal, no murmur, click, rub or gallop Extremities: extremities normal, atraumatic, no cyanosis or edema Pulses: 2+ and symmetric Skin: Skin color, texture, turgor normal. No rashes or lesions Neurologic: Alert and oriented X 3, normal strength and tone. Normal symmetric reflexes. Normal coordination and gait  EKG sinus rhythm at 71 without  ST or T wave changes.  Personally reviewed this EKG.  ASSESSMENT AND PLAN:   Dyslipidemia, goal LDL below 70 History of dyslipidemia on statin therapy with lipid profile performed 12/05/2018 revealing total cluster 118, LDL 51 and HDL 54.  Essential hypertension History of essential hypertension a blood pressure measured today 124/72.  She is on amlodipine and atenolol.  CAD S/P percutaneous coronary angioplasty History of CAD status post cardiac catheterization performed by myself 08/02/2017 revealing an occluded RCA which is chronic, occluded nondominant circumflex and moderate LAD disease with left-to-right collaterals.  She does complain of increased frequency of nitro responsive chest pain on taking nitroglycerin sublingually several times a week.  She is on Imdur 60 mg a day.  I am going to increase to 90 mg a day and start ranolazine 500 mg p.o. twice daily.  I will see her back in 3 months.  If she still having chest pain we will discuss CTO intervention.      Lorretta Harp MD FACP,FACC,FAHA, [facility] 11/28/2019 11:37 AM

## 2019-11-28 NOTE — Assessment & Plan Note (Signed)
History of CAD status post cardiac catheterization performed by myself 08/02/2017 revealing an occluded RCA which is chronic, occluded nondominant circumflex and moderate LAD disease with left-to-right collaterals.  She does complain of increased frequency of nitro responsive chest pain on taking nitroglycerin sublingually several times a week.  She is on Imdur 60 mg a day.  I am going to increase to 90 mg a day and start ranolazine 500 mg p.o. twice daily.  I will see her back in 3 months.  If she still having chest pain we will discuss CTO intervention.

## 2019-11-30 ENCOUNTER — Other Ambulatory Visit: Payer: Self-pay | Admitting: Cardiology

## 2019-11-30 ENCOUNTER — Other Ambulatory Visit: Payer: Self-pay

## 2019-12-04 ENCOUNTER — Other Ambulatory Visit: Payer: Self-pay | Admitting: Primary Care

## 2019-12-04 DIAGNOSIS — K219 Gastro-esophageal reflux disease without esophagitis: Secondary | ICD-10-CM

## 2019-12-07 ENCOUNTER — Other Ambulatory Visit: Payer: Self-pay | Admitting: Primary Care

## 2019-12-07 DIAGNOSIS — E785 Hyperlipidemia, unspecified: Secondary | ICD-10-CM

## 2019-12-15 ENCOUNTER — Ambulatory Visit (INDEPENDENT_AMBULATORY_CARE_PROVIDER_SITE_OTHER): Payer: Medicare Other | Admitting: Primary Care

## 2019-12-15 ENCOUNTER — Other Ambulatory Visit: Payer: Self-pay

## 2019-12-15 ENCOUNTER — Encounter: Payer: Self-pay | Admitting: Primary Care

## 2019-12-15 VITALS — BP 118/64 | HR 70 | Temp 98.2°F | Ht 59.0 in | Wt 142.0 lb

## 2019-12-15 DIAGNOSIS — R059 Cough, unspecified: Secondary | ICD-10-CM

## 2019-12-15 MED ORDER — SPIRIVA HANDIHALER 18 MCG IN CAPS: 18.0000 ug | ORAL_CAPSULE | Freq: Every day | RESPIRATORY_TRACT | 0 refills | Status: DC

## 2019-12-15 NOTE — Patient Instructions (Signed)
Start tiotropium (Spiriva) inhaler once daily for cough and congestion.  Please update me in one week as discussed.   It was a pleasure to see you today!

## 2019-12-15 NOTE — Assessment & Plan Note (Signed)
Ongoing since early August 2021 with congestion. Active cough today with deep congestion.  Prior work up negative for CHF cause, pneumonia, lung masses. She is already on PPI.  Suspect congested cough is secondary to tobacco abuse history, it seems that she smoked for over 30 years, quit about 20 years ago.   Also noted that her pulse oxygen level has decreased since mid July 2021 from high 90's to low 90's. Given this coupled with exertional dyspnea I recommended we prescribe a daily use inhaler and also refer to pulmonology.  She agrees to inhaler, Rx for Spiriva sent to pharmacy, she declines pulmonology evaluation as she would like to try the inhaler first. She is stable.  Will discuss with patient's daughter as well. They will update me in a week.

## 2019-12-15 NOTE — Progress Notes (Signed)
Subjective:    Patient ID: Cindy Brandt, female    DOB: 1931-07-12, 86 y.o.   MRN: [id number]  HPI  This visit occurred during the SARS-CoV-2 public health emergency.  Safety protocols were in place, including screening questions prior to the visit, additional usage of staff PPE, and extensive cleaning of exam room while observing appropriate contact time as indicated for disinfecting solutions.   Cindy Brandt is a 86 year old female with a history of tobacco abuse, hypertension, CAD, GERD, prediabetes, chronic cough who presents today for follow up of chronic cough.  Chronic, congested cough since early August 2021, has been evaluated at Urgent Care and our office for continued symptoms. She's undergone a total of two xrays which were negative for pneumonia, cardiomegaly, or mass. She's completed a course of Doxycycline and has been taking Mucinex. Labs including BNP and CBC negative.  She saw her cardiologist a few weeks ago who increased her Imdur to 90 mg daily and to start ranolazine 500 mg BID.   Today she continues to experience a productive, congested cough throughout the day. She has a history of tobacco abuse and began in her mid 33's to early 91's. She does experience shortness of breath with mild exertion. She feels well otherwise, denies fevers/chills. She has been vaccinated with Covid-19.   BP Readings from Last 3 Encounters:  12/15/19 118/64  11/28/19 124/72  10/27/19 124/78     Review of Systems  Constitutional: Positive for fatigue.  HENT: Positive for congestion.   Respiratory: Positive for cough and shortness of breath.   Cardiovascular: Negative for leg swelling.       Past Medical History:  Diagnosis Date  . Allergy   . Anemia   . Anxiety   . Arthritis   . Blood transfusion without reported diagnosis   . Cataract    a. bilerteral cataracts removed  . Coronary atherosclerosis of native coronary artery    a. 2003 Cath/unsuccessful PCI of occluded RCA;  b.  2005 NSTEMI/Cath: CTO RCA w/ L->R collats, LAD 20, LCX 42m, nl EF;  c. 11/2011 Myoview: no ischemia.  . Diastolic dysfunction 62/70/3500   a. 11/2011 Echo: Ejection fraction 60-65%, gr1 DD, basal inferior AK;  b. 09/2013 Echo: EF 55-60%, mild LVH, no rwma, Gr1 DD, mildly dil LA.  . Gastritis    a. 04/2016 EGD: Nl esophagus, gastritis, nl duodenal bulb & 2nd portion of the duodenum.  Marland Kitchen Headache(784.0)   . Hyperlipidemia   . Hypertension   . Hypokalemia   . Tubular adenoma of colon 2013     Social History   Socioeconomic History  . Marital status: Widowed    Spouse name: Not on file  . Number of children: 5  . Years of education: Not on file  . Highest education level: Not on file  Occupational History  . Occupation: Retired  Tobacco Use  . Smoking status: Former Smoker    Packs/day: 0.50    Years: 25.00    Pack years: 12.50    Types: Cigarettes    Quit date: 04/06/1996    Years since quitting: 23.7  . Smokeless tobacco: Never Used  Substance and Sexual Activity  . Alcohol use: No  . Drug use: No  . Sexual activity: Not on file  Other Topics Concern  . Not on file  Social History Narrative   Currently living with dtr in [place].  No regular exercise.   Social Determinants of Health   Financial Resource Strain:   .  Difficulty of Paying Living Expenses: Not on file  Food Insecurity:   . Worried About Charity fundraiser in the Last Year: Not on file  . Ran Out of Food in the Last Year: Not on file  Transportation Needs:   . Lack of Transportation (Medical): Not on file  . Lack of Transportation (Non-Medical): Not on file  Physical Activity:   . Days of Exercise per Week: Not on file  . Minutes of Exercise per Session: Not on file  Stress:   . Feeling of Stress : Not on file  Social Connections:   . Frequency of Communication with Friends and Family: Not on file  . Frequency of Social Gatherings with Friends and Family: Not on file  . Attends Religious Services: Not on file   . Active Member of Clubs or Organizations: Not on file  . Attends Archivist Meetings: Not on file  . Marital Status: Not on file  Intimate Partner Violence:   . Fear of Current or Ex-Partner: Not on file  . Emotionally Abused: Not on file  . Physically Abused: Not on file  . Sexually Abused: Not on file    Past Surgical History:  Procedure Laterality Date  . CARPAL TUNNEL RELEASE Bilateral   . CHOLECYSTECTOMY  2004  . COLONOSCOPY  08/06/2011   Procedure: COLONOSCOPY;  Surgeon: [name], MD;  Location: [facility];  Service: Endoscopy;  Laterality: N/A;  10:40 AM  . CORONARY ANGIOGRAPHY N/A 08/02/2017   Procedure: CORONARY ANGIOGRAPHY;  Surgeon: Lorretta Harp, MD;  Location: [facility];  Service: Cardiovascular;  Laterality: N/A;  . GALLBLADDER SURGERY    . LEFT HEART CATH AND CORONARY ANGIOGRAPHY N/A 06/15/2016   Procedure: Left Heart Cath and Coronary Angiography;  Surgeon: Lorretta Harp, MD;  Location: [facility];  Service: Cardiovascular;  Laterality: N/A;  . PARTIAL HYSTERECTOMY    . repair of belly button    . UMBILICAL HERNIA REPAIR     Umbilical hernia repair as a child    Family History  Problem Relation Age of Onset  . Coronary artery disease Father   . Heart attack Father   . Colon cancer Father   . Coronary artery disease Mother   . Stomach cancer Mother   . Heart disease Brother   . Diabetes Brother   . Pancreatic cancer Neg Hx   . Rectal cancer Neg Hx   . Esophageal cancer Neg Hx     No Known Allergies  Current Outpatient Medications on File Prior to Visit  Medication Sig Dispense Refill  . albuterol (VENTOLIN HFA) 108 (90 Base) MCG/ACT inhaler Inhale 2 puffs into the lungs every 4 (four) hours as needed for wheezing or shortness of breath. 18 g 0  . amLODipine (NORVASC) 2.5 MG tablet TAKE 1 TABLET BY MOUTH EVERY DAY 90 tablet 1  . aspirin EC 81 MG tablet Take 81 mg by mouth every morning.    Marland Kitchen atenolol (TENORMIN) 25  MG tablet Take 1 tablet (25 mg total) by mouth daily. 90 tablet 2  . atorvastatin (LIPITOR) 80 MG tablet TAKE 1 TABLET BY MOUTH EVERY DAY IN THE EVENING FOR CHOLESTEROL 90 tablet 2  . busPIRone (BUSPAR) 7.5 MG tablet Take 1 tablet by mouth twice daily for anxiety. 180 tablet 3  . calcium carbonate (CALCIUM 600) 600 MG TABS tablet Take 1,200 mg by mouth daily.    . cetirizine (ZYRTEC) 10 MG tablet Take 10 mg  by mouth daily.    . clopidogrel (PLAVIX) 75 MG tablet TAKE 1 TABLET BY MOUTH EVERY DAY 90 tablet 3  . famotidine (PEPCID) 20 MG tablet Take 20 mg by mouth daily.     Marland Kitchen gabapentin (NEURONTIN) 100 MG capsule TAKE 1 CAPSULE BY MOUTH TWICE DAILY FOR PAIN. 180 capsule 0  . isosorbide mononitrate (IMDUR) 60 MG 24 hr tablet Take 1.5 tablets (90 mg total) by mouth daily. 90 tablet 1  . Magnesium 200 MG TABS Take 1 tablet (200 mg total) by mouth daily. (Patient taking differently: Take 250 mg by mouth daily. ) 30 tablet 11  . Melatonin 5 MG TABS Take 5 mg by mouth daily as needed (sleep).    . nitroGLYCERIN (NITROSTAT) 0.4 MG SL tablet PLACE 1 TABLET UNDER THE TONGUE EVERY 5 MIN AS NEEDED FOR CHEST PAIN 25 tablet 11  . pantoprazole (PROTONIX) 40 MG tablet TAKE 1 TABLET BY MOUTH ONCE DAILY FOR HEARTBURN. 90 tablet 3  . ranolazine (RANEXA) 500 MG 12 hr tablet Take 1 tablet (500 mg total) by mouth 2 (two) times daily. 180 tablet 1  . sertraline (ZOLOFT) 100 MG tablet TAKE 1 TABLET BY MOUTH ONCE DAILY FOR DEPRESSION/ANXIETY. 90 tablet 1  . spironolactone (ALDACTONE) 25 MG tablet Take 0.5 tablets (12.5 mg total) by mouth daily. 45 tablet 3   No current facility-administered medications on file prior to visit.    BP 118/64   Pulse 70   Temp 98.2 F (36.8 C) (Temporal)   Ht 4\' 11"  (1.499 m)   Wt 142 lb (64.4 kg)   SpO2 91%   BMI 28.68 kg/m    Objective:   Physical Exam Constitutional:      Appearance: She is not ill-appearing.  Cardiovascular:     Rate and Rhythm: Normal rate and regular  rhythm.  Pulmonary:     Effort: Pulmonary effort is normal.     Breath sounds: Rhonchi present. No wheezing.  Neurological:     Mental Status: She is alert and oriented to person, place, and time.  Psychiatric:        Mood and Affect: Mood normal.            Assessment & Plan:

## 2019-12-23 ENCOUNTER — Emergency Department (HOSPITAL_COMMUNITY): Admit: 2019-12-23 | Payer: Medicare Other | Admitting: Interventional Cardiology

## 2019-12-23 ENCOUNTER — Emergency Department (HOSPITAL_COMMUNITY): Payer: Medicare Other

## 2019-12-23 ENCOUNTER — Encounter (HOSPITAL_COMMUNITY)
Admission: EM | Disposition: A | Discharge status: Home Health | Payer: Self-pay | Source: Home / Self Care | Attending: Internal Medicine

## 2019-12-23 ENCOUNTER — Inpatient Hospital Stay (HOSPITAL_COMMUNITY)
Admission: EM | Admit: 2019-12-23 | Discharge: 2019-12-29 | DRG: 871 | Disposition: A | Discharge status: Home Health | Payer: Medicare Other | Attending: Internal Medicine | Admitting: Internal Medicine

## 2019-12-23 ENCOUNTER — Encounter (HOSPITAL_COMMUNITY): Payer: Self-pay

## 2019-12-23 DIAGNOSIS — I2510 Atherosclerotic heart disease of native coronary artery without angina pectoris: Secondary | ICD-10-CM | POA: Diagnosis present

## 2019-12-23 DIAGNOSIS — I5032 Chronic diastolic (congestive) heart failure: Secondary | ICD-10-CM | POA: Diagnosis present

## 2019-12-23 DIAGNOSIS — I499 Cardiac arrhythmia, unspecified: Secondary | ICD-10-CM | POA: Diagnosis not present

## 2019-12-23 DIAGNOSIS — I25119 Atherosclerotic heart disease of native coronary artery with unspecified angina pectoris: Secondary | ICD-10-CM | POA: Diagnosis not present

## 2019-12-23 DIAGNOSIS — R6520 Severe sepsis without septic shock: Secondary | ICD-10-CM | POA: Diagnosis present

## 2019-12-23 DIAGNOSIS — J9601 Acute respiratory failure with hypoxia: Secondary | ICD-10-CM | POA: Diagnosis not present

## 2019-12-23 DIAGNOSIS — J439 Emphysema, unspecified: Secondary | ICD-10-CM | POA: Diagnosis not present

## 2019-12-23 DIAGNOSIS — Z87891 Personal history of nicotine dependence: Secondary | ICD-10-CM | POA: Diagnosis not present

## 2019-12-23 DIAGNOSIS — Z7902 Long term (current) use of antithrombotics/antiplatelets: Secondary | ICD-10-CM | POA: Diagnosis not present

## 2019-12-23 DIAGNOSIS — H9190 Unspecified hearing loss, unspecified ear: Secondary | ICD-10-CM | POA: Diagnosis not present

## 2019-12-23 DIAGNOSIS — Z9049 Acquired absence of other specified parts of digestive tract: Secondary | ICD-10-CM

## 2019-12-23 DIAGNOSIS — I2582 Chronic total occlusion of coronary artery: Secondary | ICD-10-CM | POA: Diagnosis present

## 2019-12-23 DIAGNOSIS — I110 Hypertensive heart disease with heart failure: Secondary | ICD-10-CM | POA: Diagnosis not present

## 2019-12-23 DIAGNOSIS — R079 Chest pain, unspecified: Secondary | ICD-10-CM

## 2019-12-23 DIAGNOSIS — K219 Gastro-esophageal reflux disease without esophagitis: Secondary | ICD-10-CM | POA: Diagnosis not present

## 2019-12-23 DIAGNOSIS — Z90711 Acquired absence of uterus with remaining cervical stump: Secondary | ICD-10-CM | POA: Diagnosis not present

## 2019-12-23 DIAGNOSIS — R7989 Other specified abnormal findings of blood chemistry: Secondary | ICD-10-CM | POA: Diagnosis present

## 2019-12-23 DIAGNOSIS — I252 Old myocardial infarction: Secondary | ICD-10-CM

## 2019-12-23 DIAGNOSIS — Z8249 Family history of ischemic heart disease and other diseases of the circulatory system: Secondary | ICD-10-CM

## 2019-12-23 DIAGNOSIS — Z833 Family history of diabetes mellitus: Secondary | ICD-10-CM | POA: Diagnosis not present

## 2019-12-23 DIAGNOSIS — R0602 Shortness of breath: Secondary | ICD-10-CM | POA: Diagnosis not present

## 2019-12-23 DIAGNOSIS — J189 Pneumonia, unspecified organism: Secondary | ICD-10-CM

## 2019-12-23 DIAGNOSIS — R059 Cough, unspecified: Secondary | ICD-10-CM | POA: Diagnosis not present

## 2019-12-23 DIAGNOSIS — R0902 Hypoxemia: Secondary | ICD-10-CM

## 2019-12-23 DIAGNOSIS — E782 Mixed hyperlipidemia: Secondary | ICD-10-CM | POA: Diagnosis not present

## 2019-12-23 DIAGNOSIS — A419 Sepsis, unspecified organism: Secondary | ICD-10-CM | POA: Diagnosis present

## 2019-12-23 DIAGNOSIS — E119 Type 2 diabetes mellitus without complications: Secondary | ICD-10-CM | POA: Diagnosis not present

## 2019-12-23 DIAGNOSIS — R0789 Other chest pain: Secondary | ICD-10-CM | POA: Diagnosis present

## 2019-12-23 DIAGNOSIS — I10 Essential (primary) hypertension: Secondary | ICD-10-CM | POA: Diagnosis not present

## 2019-12-23 DIAGNOSIS — Z888 Allergy status to other drugs, medicaments and biological substances status: Secondary | ICD-10-CM | POA: Diagnosis not present

## 2019-12-23 DIAGNOSIS — I517 Cardiomegaly: Secondary | ICD-10-CM | POA: Diagnosis not present

## 2019-12-23 DIAGNOSIS — R0781 Pleurodynia: Secondary | ICD-10-CM | POA: Diagnosis not present

## 2019-12-23 DIAGNOSIS — Z79899 Other long term (current) drug therapy: Secondary | ICD-10-CM

## 2019-12-23 DIAGNOSIS — Z7982 Long term (current) use of aspirin: Secondary | ICD-10-CM

## 2019-12-23 DIAGNOSIS — I447 Left bundle-branch block, unspecified: Secondary | ICD-10-CM | POA: Diagnosis present

## 2019-12-23 DIAGNOSIS — R6889 Other general symptoms and signs: Secondary | ICD-10-CM | POA: Diagnosis not present

## 2019-12-23 DIAGNOSIS — Z20822 Contact with and (suspected) exposure to covid-19: Secondary | ICD-10-CM | POA: Diagnosis present

## 2019-12-23 DIAGNOSIS — Z800 Family history of malignant neoplasm of digestive organs: Secondary | ICD-10-CM

## 2019-12-23 DIAGNOSIS — J9811 Atelectasis: Secondary | ICD-10-CM | POA: Diagnosis not present

## 2019-12-23 DIAGNOSIS — Z743 Need for continuous supervision: Secondary | ICD-10-CM | POA: Diagnosis not present

## 2019-12-23 DIAGNOSIS — R072 Precordial pain: Secondary | ICD-10-CM | POA: Diagnosis not present

## 2019-12-23 HISTORY — DX: Pneumonia, unspecified organism: J18.9

## 2019-12-23 HISTORY — DX: Sepsis, unspecified organism: A41.9

## 2019-12-23 HISTORY — DX: Acute respiratory failure with hypoxia: J96.01

## 2019-12-23 LAB — URINALYSIS, ROUTINE W REFLEX MICROSCOPIC
Bacteria, UA: NONE SEEN
Bilirubin Urine: NEGATIVE
Glucose, UA: NEGATIVE mg/dL
Ketones, ur: 5 mg/dL — AB
Leukocytes,Ua: NEGATIVE
Nitrite: NEGATIVE
Protein, ur: 100 mg/dL — AB
Specific Gravity, Urine: >1.046 — ABNORMAL HIGH (ref 1.005–1.030)
pH: 5 (ref 5.0–8.0)

## 2019-12-23 LAB — LIPASE, BLOOD: Lipase: 17 U/L (ref 11–51)

## 2019-12-23 LAB — I-STAT ARTERIAL BLOOD GAS, ED
Acid-Base Excess: 3 mmol/L — ABNORMAL HIGH (ref 0.0–2.0)
Bicarbonate: 28.4 mmol/L — ABNORMAL HIGH (ref 20.0–28.0)
Calcium, Ion: 1.24 mmol/L (ref 1.15–1.40)
HCT: 33 % — ABNORMAL LOW (ref 36.0–46.0)
Hemoglobin: 11.2 g/dL — ABNORMAL LOW (ref 12.0–15.0)
O2 Saturation: 98 %
Patient temperature: 100.7
Potassium: 4.3 mmol/L (ref 3.5–5.1)
Sodium: 136 mmol/L (ref 135–145)
TCO2: 30 mmol/L (ref 22–32)
pCO2 arterial: 46 mmHg (ref 32.0–48.0)
pH, Arterial: 7.403 (ref 7.350–7.450)
pO2, Arterial: 104 mmHg (ref 83.0–108.0)

## 2019-12-23 LAB — CBC WITH DIFFERENTIAL/PLATELET
Abs Immature Granulocytes: 0.08 10*3/uL — ABNORMAL HIGH (ref 0.00–0.07)
Basophils Absolute: 0 10*3/uL (ref 0.0–0.1)
Basophils Relative: 0 %
Eosinophils Absolute: 0 10*3/uL (ref 0.0–0.5)
Eosinophils Relative: 0 %
HCT: 36.6 % (ref 36.0–46.0)
Hemoglobin: 11.9 g/dL — ABNORMAL LOW (ref 12.0–15.0)
Immature Granulocytes: 1 %
Lymphocytes Relative: 5 %
Lymphs Abs: 0.5 10*3/uL — ABNORMAL LOW (ref 0.7–4.0)
MCH: 34.1 pg — ABNORMAL HIGH (ref 26.0–34.0)
MCHC: 32.5 g/dL (ref 30.0–36.0)
MCV: 104.9 fL — ABNORMAL HIGH (ref 80.0–100.0)
Monocytes Absolute: 0.5 10*3/uL (ref 0.1–1.0)
Monocytes Relative: 4 %
Neutro Abs: 9.7 10*3/uL — ABNORMAL HIGH (ref 1.7–7.7)
Neutrophils Relative %: 90 %
Platelets: 223 10*3/uL (ref 150–400)
RBC: 3.49 MIL/uL — ABNORMAL LOW (ref 3.87–5.11)
RDW: 11.7 % (ref 11.5–15.5)
WBC: 10.7 10*3/uL — ABNORMAL HIGH (ref 4.0–10.5)
nRBC: 0 % (ref 0.0–0.2)

## 2019-12-23 LAB — HIV ANTIBODY (ROUTINE TESTING W REFLEX): HIV Screen 4th Generation wRfx: NONREACTIVE

## 2019-12-23 LAB — PROTIME-INR
INR: 0.9 (ref 0.8–1.2)
Prothrombin Time: 11.8 seconds (ref 11.4–15.2)

## 2019-12-23 LAB — COMPREHENSIVE METABOLIC PANEL
ALT: 15 U/L (ref 0–44)
AST: 22 U/L (ref 15–41)
Albumin: 3.4 g/dL — ABNORMAL LOW (ref 3.5–5.0)
Alkaline Phosphatase: 57 U/L (ref 38–126)
Anion gap: 12 (ref 5–15)
BUN: 17 mg/dL (ref 8–23)
CO2: 24 mmol/L (ref 22–32)
Calcium: 9.5 mg/dL (ref 8.9–10.3)
Chloride: 99 mmol/L (ref 98–111)
Creatinine, Ser: 0.93 mg/dL (ref 0.44–1.00)
GFR, Estimated: 60 mL/min — ABNORMAL LOW (ref >60)
Glucose, Bld: 161 mg/dL — ABNORMAL HIGH (ref 70–99)
Potassium: 4.6 mmol/L (ref 3.5–5.1)
Sodium: 135 mmol/L (ref 135–145)
Total Bilirubin: 1.1 mg/dL (ref 0.3–1.2)
Total Protein: 7.6 g/dL (ref 6.5–8.1)

## 2019-12-23 LAB — LACTIC ACID, PLASMA
Lactic Acid, Venous: 1.6 mmol/L (ref 0.5–1.9)
Lactic Acid, Venous: 0.8 mmol/L (ref 0.5–1.9)

## 2019-12-23 LAB — RESPIRATORY PANEL BY RT PCR (FLU A&B, COVID)
Influenza A by PCR: NEGATIVE
Influenza B by PCR: NEGATIVE
SARS Coronavirus 2 by RT PCR: NEGATIVE

## 2019-12-23 LAB — BRAIN NATRIURETIC PEPTIDE: B Natriuretic Peptide: 347.1 pg/mL — ABNORMAL HIGH (ref 0.0–100.0)

## 2019-12-23 LAB — TROPONIN I (HIGH SENSITIVITY)
Troponin I (High Sensitivity): 17 ng/L (ref <18)
Troponin I (High Sensitivity): 17 ng/L (ref <18)

## 2019-12-23 LAB — C-REACTIVE PROTEIN: CRP: 15.3 mg/dL — ABNORMAL HIGH (ref <1.0)

## 2019-12-23 LAB — PROCALCITONIN: Procalcitonin: 0.52 ng/mL

## 2019-12-23 LAB — D-DIMER, QUANTITATIVE: D-Dimer, Quant: 1.5 ug/mL-FEU — ABNORMAL HIGH (ref 0.00–0.50)

## 2019-12-23 SURGERY — CORONARY/GRAFT ACUTE MI REVASCULARIZATION
Anesthesia: LOCAL

## 2019-12-23 MED ORDER — ONDANSETRON HCL 4 MG/2ML IJ SOLN: 4.0000 mg | Freq: Four times a day (QID) | INTRAMUSCULAR | Status: DC | PRN

## 2019-12-23 MED ORDER — SERTRALINE HCL 100 MG PO TABS
100.0000 mg | ORAL_TABLET | Freq: Every day | ORAL | Status: DC
  Administered 2019-12-23 – 2019-12-28 (×6): 100 mg via ORAL
  Filled 2019-12-23 (×4): qty 1
  Filled 2019-12-23: qty 2
  Filled 2019-12-23: qty 1

## 2019-12-23 MED ORDER — IOHEXOL 350 MG/ML SOLN
65.0000 mL | Freq: Once | INTRAVENOUS | Status: AC | PRN
  Administered 2019-12-23: 65 mL via INTRAVENOUS

## 2019-12-23 MED ORDER — ASPIRIN EC 81 MG PO TBEC
81.0000 mg | DELAYED_RELEASE_TABLET | Freq: Every morning | ORAL | Status: DC
  Administered 2019-12-24 – 2019-12-29 (×6): 81 mg via ORAL
  Filled 2019-12-23 (×6): qty 1

## 2019-12-23 MED ORDER — MELATONIN 5 MG PO TABS
5.0000 mg | ORAL_TABLET | Freq: Every evening | ORAL | Status: DC | PRN
  Administered 2019-12-24 – 2019-12-27 (×3): 5 mg via ORAL
  Filled 2019-12-23 (×4): qty 1

## 2019-12-23 MED ORDER — ACETAMINOPHEN 650 MG RE SUPP: 650.0000 mg | Freq: Four times a day (QID) | RECTAL | Status: DC | PRN

## 2019-12-23 MED ORDER — NITROGLYCERIN 0.4 MG SL SUBL
0.4000 mg | SUBLINGUAL_TABLET | SUBLINGUAL | Status: DC | PRN
  Administered 2019-12-25 – 2019-12-28 (×5): 0.4 mg via SUBLINGUAL
  Filled 2019-12-23 (×5): qty 1

## 2019-12-23 MED ORDER — ATORVASTATIN CALCIUM 80 MG PO TABS
80.0000 mg | ORAL_TABLET | Freq: Every evening | ORAL | Status: DC
  Administered 2019-12-23 – 2019-12-29 (×7): 80 mg via ORAL
  Filled 2019-12-23: qty 1
  Filled 2019-12-23: qty 8
  Filled 2019-12-23 (×5): qty 1

## 2019-12-23 MED ORDER — LACTATED RINGERS IV SOLN: INTRAVENOUS | Status: DC

## 2019-12-23 MED ORDER — ONDANSETRON HCL 4 MG PO TABS: 4.0000 mg | ORAL_TABLET | Freq: Four times a day (QID) | ORAL | Status: DC | PRN

## 2019-12-23 MED ORDER — PANTOPRAZOLE SODIUM 40 MG PO TBEC
40.0000 mg | DELAYED_RELEASE_TABLET | Freq: Every day | ORAL | Status: DC
  Administered 2019-12-24 – 2019-12-29 (×6): 40 mg via ORAL
  Filled 2019-12-23 (×6): qty 1

## 2019-12-23 MED ORDER — ALBUTEROL SULFATE HFA 108 (90 BASE) MCG/ACT IN AERS
2.0000 | INHALATION_SPRAY | RESPIRATORY_TRACT | Status: DC | PRN
  Filled 2019-12-23: qty 6.7

## 2019-12-23 MED ORDER — LORATADINE 10 MG PO TABS
10.0000 mg | ORAL_TABLET | Freq: Every day | ORAL | Status: DC
  Administered 2019-12-24 – 2019-12-29 (×6): 10 mg via ORAL
  Filled 2019-12-23 (×6): qty 1

## 2019-12-23 MED ORDER — ATENOLOL 25 MG PO TABS
25.0000 mg | ORAL_TABLET | Freq: Every day | ORAL | Status: DC
  Administered 2019-12-24 – 2019-12-29 (×6): 25 mg via ORAL
  Filled 2019-12-23 (×7): qty 1

## 2019-12-23 MED ORDER — FAMOTIDINE 20 MG PO TABS
20.0000 mg | ORAL_TABLET | Freq: Every day | ORAL | Status: DC
  Administered 2019-12-24 – 2019-12-29 (×6): 20 mg via ORAL
  Filled 2019-12-23 (×6): qty 1

## 2019-12-23 MED ORDER — AMLODIPINE BESYLATE 2.5 MG PO TABS
2.5000 mg | ORAL_TABLET | Freq: Every day | ORAL | Status: DC
  Administered 2019-12-24 – 2019-12-29 (×6): 2.5 mg via ORAL
  Filled 2019-12-23 (×6): qty 1

## 2019-12-23 MED ORDER — POLYETHYLENE GLYCOL 3350 17 G PO PACK: 17.0000 g | PACK | Freq: Every day | ORAL | Status: DC | PRN

## 2019-12-23 MED ORDER — CLOPIDOGREL BISULFATE 75 MG PO TABS
75.0000 mg | ORAL_TABLET | Freq: Every day | ORAL | Status: DC
  Administered 2019-12-23 – 2019-12-28 (×6): 75 mg via ORAL
  Filled 2019-12-23 (×6): qty 1

## 2019-12-23 MED ORDER — ENOXAPARIN SODIUM 40 MG/0.4ML ~~LOC~~ SOLN
40.0000 mg | Freq: Every day | SUBCUTANEOUS | Status: DC
  Administered 2019-12-24 – 2019-12-29 (×6): 40 mg via SUBCUTANEOUS
  Filled 2019-12-23 (×6): qty 0.4

## 2019-12-23 MED ORDER — ISOSORBIDE MONONITRATE ER 60 MG PO TB24
60.0000 mg | ORAL_TABLET | Freq: Every morning | ORAL | Status: DC
  Administered 2019-12-25 – 2019-12-29 (×5): 60 mg via ORAL
  Filled 2019-12-23 (×6): qty 1

## 2019-12-23 MED ORDER — UMECLIDINIUM BROMIDE 62.5 MCG/INH IN AEPB
1.0000 | INHALATION_SPRAY | Freq: Every day | RESPIRATORY_TRACT | Status: DC
  Administered 2019-12-24: 09:00:00 1 via RESPIRATORY_TRACT
  Filled 2019-12-23: qty 7

## 2019-12-23 MED ORDER — GABAPENTIN 100 MG PO CAPS
100.0000 mg | ORAL_CAPSULE | Freq: Two times a day (BID) | ORAL | Status: DC
  Administered 2019-12-23 – 2019-12-29 (×12): 100 mg via ORAL
  Filled 2019-12-23 (×12): qty 1

## 2019-12-23 MED ORDER — ACETAMINOPHEN 325 MG PO TABS
650.0000 mg | ORAL_TABLET | Freq: Four times a day (QID) | ORAL | Status: DC | PRN
  Administered 2019-12-24 – 2019-12-28 (×6): 650 mg via ORAL
  Filled 2019-12-23 (×5): qty 2

## 2019-12-23 MED ORDER — SODIUM CHLORIDE 0.9 % IV SOLN
2.0000 g | Freq: Every day | INTRAVENOUS | Status: AC
  Administered 2019-12-23 – 2019-12-27 (×5): 2 g via INTRAVENOUS
  Filled 2019-12-23 (×5): qty 20

## 2019-12-23 MED ORDER — SODIUM CHLORIDE 0.9 % IV SOLN
500.0000 mg | Freq: Every day | INTRAVENOUS | Status: DC
  Administered 2019-12-24 – 2019-12-25 (×3): 500 mg via INTRAVENOUS
  Filled 2019-12-23 (×3): qty 500

## 2019-12-23 MED ORDER — BUSPIRONE HCL 5 MG PO TABS
7.5000 mg | ORAL_TABLET | Freq: Two times a day (BID) | ORAL | Status: DC
  Administered 2019-12-23 – 2019-12-29 (×12): 7.5 mg via ORAL
  Filled 2019-12-23 (×8): qty 2
  Filled 2019-12-23: qty 1
  Filled 2019-12-23 (×3): qty 2

## 2019-12-23 NOTE — ED Provider Notes (Signed)
[facility] EMERGENCY DEPARTMENT Provider Note   CSN: [id number] Arrival date & time:        History Chief Complaint  Patient presents with  . Code STEMI    Cindy Brandt is a 86 y.o. female.  The history is provided by the patient and medical records. No language interpreter was used.  Chest Pain Pain location:  Substernal area Pain quality: aching, crushing, pressure and tightness   Pain radiates to:  Does not radiate Pain severity:  Severe Onset quality:  Gradual Duration:  1 day Timing:  Intermittent Progression:  Resolved Chronicity:  Recurrent Worsened by:  Nothing Ineffective treatments:  None tried Associated symptoms: cough, fatigue, fever, nausea and shortness of breath   Associated symptoms: no abdominal pain, no altered mental status, no back pain, no claudication, no dizziness, no headache, no lower extremity edema, no palpitations, no vomiting and no weakness        Past Medical History:  Diagnosis Date  . Allergy   . Anemia   . Anxiety   . Arthritis   . Blood transfusion without reported diagnosis   . Cataract    a. bilerteral cataracts removed  . Coronary atherosclerosis of native coronary artery    a. 2003 Cath/unsuccessful PCI of occluded RCA;  b. 2005 NSTEMI/Cath: CTO RCA w/ L->R collats, LAD 20, LCX 34m, nl EF;  c. 11/2011 Myoview: no ischemia.  . Diastolic dysfunction 62/94/7654   a. 11/2011 Echo: Ejection fraction 60-65%, gr1 DD, basal inferior AK;  b. 09/2013 Echo: EF 55-60%, mild LVH, no rwma, Gr1 DD, mildly dil LA.  . Gastritis    a. 04/2016 EGD: Nl esophagus, gastritis, nl duodenal bulb & 2nd portion of the duodenum.  Marland Kitchen Headache(784.0)   . Hyperlipidemia   . Hypertension   . Hypokalemia   . Tubular adenoma of colon 2013    Patient Active Problem List   Diagnosis Date Noted  . Cough 10/27/2019  . Pain of right lower extremity 06/26/2019  . Dizziness 06/26/2019  . Herpes zoster without complication 65/05/5463  .  Angular cheilitis 05/04/2018  . Hemorrhoids 04/11/2018  . Lower abdominal pain 04/11/2018  . Osteoporosis 11/15/2017  . Prediabetes 11/15/2017  . Presbycusis of both ears 11/16/2016  . Anemia 06/20/2016  . Dyspnea   . NSTEMI (non-ST elevated myocardial infarction) ([place]) 06/14/2016  . Unstable angina ([place]) 06/14/2016  . Hx of adenomatous colonic polyps 04/06/2016  . GERD (gastroesophageal reflux disease) 10/29/2015  . IBS (irritable bowel syndrome) 10/29/2015  . Urinary incontinence 10/29/2015  . Osteoarthritis 10/29/2015  . Diastolic dysfunction 68/01/7516  . Chest pain with moderate risk for cardiac etiology 11/23/2011  . Chronic pain of left knee 01/08/2011  . HYPOKALEMIA 07/31/2009  . Dyslipidemia, goal LDL below 70 05/22/2009  . Anxiety and depression 05/22/2009  . Essential hypertension 05/22/2009  . CAD S/P percutaneous coronary angioplasty 05/22/2009    Past Surgical History:  Procedure Laterality Date  . CARPAL TUNNEL RELEASE Bilateral   . CHOLECYSTECTOMY  2004  . COLONOSCOPY  08/06/2011   Procedure: COLONOSCOPY;  Surgeon: [name], MD;  Location: [facility];  Service: Endoscopy;  Laterality: N/A;  10:40 AM  . CORONARY ANGIOGRAPHY N/A 08/02/2017   Procedure: CORONARY ANGIOGRAPHY;  Surgeon: Lorretta Harp, MD;  Location: [facility];  Service: Cardiovascular;  Laterality: N/A;  . GALLBLADDER SURGERY    . LEFT HEART CATH AND CORONARY ANGIOGRAPHY N/A 06/15/2016   Procedure: Left Heart Cath and Coronary Angiography;  Surgeon: Lorretta Harp, MD;  Location: [facility];  Service: Cardiovascular;  Laterality: N/A;  . PARTIAL HYSTERECTOMY    . repair of belly button    . UMBILICAL HERNIA REPAIR     Umbilical hernia repair as a child     OB History   No obstetric history on file.     Family History  Problem Relation Age of Onset  . Coronary artery disease Father   . Heart attack Father   . Colon cancer Father   . Coronary artery disease  Mother   . Stomach cancer Mother   . Heart disease Brother   . Diabetes Brother   . Pancreatic cancer Neg Hx   . Rectal cancer Neg Hx   . Esophageal cancer Neg Hx     Social History   Tobacco Use  . Smoking status: Former Smoker    Packs/day: 0.50    Years: 25.00    Pack years: 12.50    Types: Cigarettes    Quit date: 04/06/1996    Years since quitting: 23.7  . Smokeless tobacco: Never Used  Substance Use Topics  . Alcohol use: No  . Drug use: No    Home Medications Prior to Admission medications   Medication Sig Start Date End Date Taking? Authorizing Provider  albuterol (VENTOLIN HFA) 108 (90 Base) MCG/ACT inhaler Inhale 2 puffs into the lungs every 4 (four) hours as needed for wheezing or shortness of breath. 10/09/19   Hall-Potvin, [place], PA-C  amLODipine (NORVASC) 2.5 MG tablet TAKE 1 TABLET BY MOUTH EVERY DAY 11/27/19   Lorretta Harp, MD  aspirin EC 81 MG tablet Take 81 mg by mouth every morning.    [provider]  atenolol (TENORMIN) 25 MG tablet Take 1 tablet (25 mg total) by mouth daily. 07/04/19   Erlene Quan, PA-C  atorvastatin (LIPITOR) 80 MG tablet TAKE 1 TABLET BY MOUTH EVERY DAY IN THE EVENING FOR CHOLESTEROL 12/07/19   [name], NP  busPIRone (BUSPAR) 7.5 MG tablet Take 1 tablet by mouth twice daily for anxiety. 12/05/18   [name], NP  calcium carbonate (CALCIUM 600) 600 MG TABS tablet Take 1,200 mg by mouth daily.    [provider]  cetirizine (ZYRTEC) 10 MG tablet Take 10 mg by mouth daily.    [provider]  clopidogrel (PLAVIX) 75 MG tablet TAKE 1 TABLET BY MOUTH EVERY DAY 11/30/19   Kerin Ransom K, PA-C  famotidine (PEPCID) 20 MG tablet Take 20 mg by mouth daily.     [provider]  gabapentin (NEURONTIN) 100 MG capsule TAKE 1 CAPSULE BY MOUTH TWICE DAILY FOR PAIN. 11/30/19   [name], NP  isosorbide mononitrate (IMDUR) 60 MG 24 hr tablet Take 1.5 tablets (90 mg total) by mouth daily.  11/28/19   Lorretta Harp, MD  Magnesium 200 MG TABS Take 1 tablet (200 mg total) by mouth daily. Patient taking differently: Take 250 mg by mouth daily.  05/30/19   Lendon Colonel, NP  Melatonin 5 MG TABS Take 5 mg by mouth daily as needed (sleep).    [provider]  nitroGLYCERIN (NITROSTAT) 0.4 MG SL tablet PLACE 1 TABLET UNDER THE TONGUE EVERY 5 MIN AS NEEDED FOR CHEST PAIN 08/01/19   Lorretta Harp, MD  pantoprazole (PROTONIX) 40 MG tablet TAKE 1 TABLET BY MOUTH ONCE DAILY FOR HEARTBURN. 12/07/19   [name], NP  ranolazine (RANEXA) 500 MG 12 hr  tablet Take 1 tablet (500 mg total) by mouth 2 (two) times daily. 11/28/19   Lorretta Harp, MD  sertraline (ZOLOFT) 100 MG tablet TAKE 1 TABLET BY MOUTH ONCE DAILY FOR DEPRESSION/ANXIETY. 09/18/19   [name], NP  spironolactone (ALDACTONE) 25 MG tablet Take 0.5 tablets (12.5 mg total) by mouth daily. 09/15/19   Lorretta Harp, MD  tiotropium (SPIRIVA HANDIHALER) 18 MCG inhalation capsule Place 1 capsule (18 mcg total) into inhaler and inhale daily. 12/15/19   [name], NP    Allergies    Patient has no known allergies.  Review of Systems   Review of Systems  Constitutional: Positive for chills, fatigue and fever.  HENT: Positive for congestion.   Respiratory: Positive for cough, chest tightness and shortness of breath. Negative for wheezing.   Cardiovascular: Positive for chest pain. Negative for palpitations, claudication and leg swelling.  Gastrointestinal: Positive for nausea. Negative for abdominal pain, constipation, diarrhea and vomiting.  Genitourinary: Negative for dysuria.  Musculoskeletal: Negative for back pain, neck pain and neck stiffness.  Skin: Negative for wound.  Neurological: Negative for dizziness, weakness, light-headedness and headaches.  Psychiatric/Behavioral: Negative for agitation.  All other systems reviewed and are negative.   Physical Exam Updated Vital Signs BP  (!) 157/66   Pulse 93   Temp (!) 101.4 F (38.6 C) (Oral)   Resp 19   SpO2 96%   Physical Exam Vitals and nursing note reviewed.  Constitutional:      General: She is not in acute distress.    Appearance: She is well-developed. She is ill-appearing. She is not toxic-appearing or diaphoretic.  HENT:     Head: Normocephalic and atraumatic.     Right Ear: External ear normal.     Left Ear: External ear normal.     Nose: Congestion present. No rhinorrhea.     Mouth/Throat:     Mouth: Mucous membranes are moist.     Pharynx: No oropharyngeal exudate or posterior oropharyngeal erythema.  Eyes:     Conjunctiva/sclera: Conjunctivae normal.     Pupils: Pupils are equal, round, and reactive to light.  Cardiovascular:     Rate and Rhythm: Normal rate and regular rhythm.     Heart sounds: No murmur heard.   Pulmonary:     Effort: No respiratory distress.     Breath sounds: No stridor. Rhonchi and rales present. No wheezing.  Chest:     Chest wall: No tenderness.  Abdominal:     General: There is no distension.     Tenderness: There is no abdominal tenderness. There is no right CVA tenderness, left CVA tenderness or rebound.  Musculoskeletal:     Cervical back: Normal range of motion and neck supple. No tenderness.  Skin:    General: Skin is warm.     Findings: No erythema or rash.  Neurological:     General: No focal deficit present.     Mental Status: She is alert and oriented to person, place, and time.     Motor: No abnormal muscle tone.     Deep Tendon Reflexes: Reflexes are normal and symmetric.  Psychiatric:        Mood and Affect: Mood normal.     ED Results / Procedures / Treatments   Labs (all labs ordered are listed, but only abnormal results are displayed) Labs Reviewed  COMPREHENSIVE METABOLIC PANEL - Abnormal; Notable for the following components:      Result Value   Glucose, Bld  161 (*)    Albumin 3.4 (*)    GFR, Estimated 60 (*)    All other components  within normal limits  D-DIMER, QUANTITATIVE (NOT AT [facility]) - Abnormal; Notable for the following components:   D-Dimer, Quant 1.50 (*)    All other components within normal limits  URINALYSIS, ROUTINE W REFLEX MICROSCOPIC - Abnormal; Notable for the following components:   Specific Gravity, Urine >1.046 (*)    Hgb urine dipstick MODERATE (*)    Ketones, ur 5 (*)    Protein, ur 100 (*)    All other components within normal limits  BRAIN NATRIURETIC PEPTIDE - Abnormal; Notable for the following components:   B Natriuretic Peptide 347.1 (*)    All other components within normal limits  CBC WITH DIFFERENTIAL/PLATELET - Abnormal; Notable for the following components:   WBC 10.7 (*)    RBC 3.49 (*)    Hemoglobin 11.9 (*)    MCV 104.9 (*)    MCH 34.1 (*)    Neutro Abs 9.7 (*)    Lymphs Abs 0.5 (*)    Abs Immature Granulocytes 0.08 (*)    All other components within normal limits  RESPIRATORY PANEL BY RT PCR (FLU A&B, COVID)  CULTURE, BLOOD (ROUTINE X 2)  CULTURE, BLOOD (ROUTINE X 2)  URINE CULTURE  LACTIC ACID, PLASMA  LIPASE, BLOOD  PROTIME-INR  CBC WITH DIFFERENTIAL/PLATELET  LACTIC ACID, PLASMA  C-REACTIVE PROTEIN  PROCALCITONIN  HEMOGLOBIN A1C  BLOOD GAS, ARTERIAL  HIV ANTIBODY (ROUTINE TESTING W REFLEX)  COMPREHENSIVE METABOLIC PANEL  MAGNESIUM  CBC WITH DIFFERENTIAL/PLATELET  TROPONIN I (HIGH SENSITIVITY)  TROPONIN I (HIGH SENSITIVITY)    EKG None ED ECG REPORT   Date: 12/23/2019  Rate: 94  Rhythm: normal sinus rhythm  QRS Axis: normal  Intervals: normal  ST/T Wave abnormalities: nonspecific ST changes  Conduction Disutrbances:none  Narrative Interpretation:   Old EKG Reviewed: unchanged  I have personally reviewed the EKG tracing and agree with the computerized printout as noted.    Radiology CT Angio Chest PE W and/or Wo Contrast  Result Date: 12/23/2019 CLINICAL DATA:  Positive D-dimer EXAM: CT ANGIOGRAPHY CHEST WITH CONTRAST TECHNIQUE: Multidetector  CT imaging of the chest was performed using the standard protocol during bolus administration of intravenous contrast. Multiplanar CT image reconstructions and MIPs were obtained to evaluate the vascular anatomy. CONTRAST:  42mL OMNIPAQUE IOHEXOL 350 MG/ML SOLN COMPARISON:  Chest x-ray from earlier in the same day. FINDINGS: Cardiovascular: Atherosclerotic calcifications of the thoracic aorta are noted without aneurysmal dilatation. The degree of opacification is limited. The pulmonary artery shows a normal branching pattern bilaterally. No filling defects to suggest pulmonary emboli are identified. Coronary calcifications are seen. No cardiac enlargement is noted. Mediastinum/Nodes: Thoracic inlet is within normal limits. The esophagus as visualized is within normal limits. No mediastinal adenopathy is noted. Small hilar nodes are noted particularly on the right likely reactive in nature Lungs/Pleura: Mild bibasilar atelectatic changes are noted. No sizable effusion is seen. No sizable pulmonary nodules are noted. Upper Abdomen: No acute abnormality. Musculoskeletal: Degenerative changes of the thoracic spine are seen. No acute rib abnormality is noted. Review of the MIP images confirms the above findings. IMPRESSION: No evidence of pulmonary emboli. Mild bibasilar atelectasis. Aortic Atherosclerosis (ICD10-I70.0). Electronically Signed   By: Inez Catalina M.D.   On: 12/23/2019 19:55   DG Chest Portable 1 View  Result Date: 12/23/2019 CLINICAL DATA:  Hypoxia, short of breath EXAM: PORTABLE CHEST 1 VIEW COMPARISON:  10/27/2019  FINDINGS: Stable enlarged cardiac silhouette. Ectatic aorta. No effusion, infiltrate or pneumothorax. Chronic bronchitic markings. Hyperinflated lungs. IMPRESSION: 1. No acute findings. 2. Chronic bronchitic markings. 3. Ectatic calcified aorta. 4. Hyperinflated lungs. Electronically Signed   By: Suzy Bouchard M.D.   On: 12/23/2019 18:33    Procedures Procedures (including critical  care time)  CRITICAL CARE Performed by: Gwenyth Allegra Jamyiah Labella Total critical care time: 40 minutes Critical care time was exclusive of separately billable procedures and treating other patients. Critical care was necessary to treat or prevent imminent or life-threatening deterioration. Critical care was time spent personally by me on the following activities: development of treatment plan with patient and/or surrogate as well as nursing, discussions with consultants, evaluation of patient's response to treatment, examination of patient, obtaining history from patient or surrogate, ordering and performing treatments and interventions, ordering and review of laboratory studies, ordering and review of radiographic studies, pulse oximetry and re-evaluation of patient's condition.   Medications Ordered in ED Medications  cefTRIAXone (ROCEPHIN) 2 g in sodium chloride 0.9 % 100 mL IVPB (has no administration in time range)  azithromycin (ZITHROMAX) 500 mg in sodium chloride 0.9 % 250 mL IVPB (has no administration in time range)  albuterol (VENTOLIN HFA) 108 (90 Base) MCG/ACT inhaler 2 puff (has no administration in time range)  aspirin EC tablet 81 mg (has no administration in time range)  amLODipine (NORVASC) tablet 2.5 mg (has no administration in time range)  atenolol (TENORMIN) tablet 25 mg (has no administration in time range)  atorvastatin (LIPITOR) tablet 80 mg (has no administration in time range)  isosorbide mononitrate (IMDUR) 24 hr tablet 60 mg (has no administration in time range)  nitroGLYCERIN (NITROSTAT) SL tablet 0.4 mg (has no administration in time range)  busPIRone (BUSPAR) tablet 7.5 mg (has no administration in time range)  sertraline (ZOLOFT) tablet 100 mg (has no administration in time range)  famotidine (PEPCID) tablet 20 mg (has no administration in time range)  pantoprazole (PROTONIX) EC tablet 40 mg (has no administration in time range)  clopidogrel (PLAVIX) tablet 75 mg  (has no administration in time range)  melatonin tablet 5 mg (has no administration in time range)  gabapentin (NEURONTIN) capsule 100 mg (has no administration in time range)  loratadine (CLARITIN) tablet 10 mg (has no administration in time range)  umeclidinium bromide (INCRUSE ELLIPTA) 62.5 MCG/INH 1 puff (has no administration in time range)  enoxaparin (LOVENOX) injection 40 mg (has no administration in time range)  acetaminophen (TYLENOL) tablet 650 mg (has no administration in time range)    Or  acetaminophen (TYLENOL) suppository 650 mg (has no administration in time range)  polyethylene glycol (MIRALAX / GLYCOLAX) packet 17 g (has no administration in time range)  ondansetron (ZOFRAN) tablet 4 mg (has no administration in time range)    Or  ondansetron (ZOFRAN) injection 4 mg (has no administration in time range)  iohexol (OMNIPAQUE) 350 MG/ML injection 65 mL (65 mLs Intravenous Contrast Given 12/23/19 1948)    ED Course  I have reviewed the triage vital signs and the nursing notes.  Pertinent labs & imaging results that were available during my care of the patient were reviewed by me and considered in my medical decision making (see chart for details).    MDM Rules/Calculators/A&P                          LUNDYNN COHOON is a 86 y.o. female with a past medical  history significant for CAD status post PCI, hypertension, dyslipidemia, GERD, and osteoporosis who presents as a code STEMI for chest discomfort.  Patient brought in by EMS.  According to EMS, patient has had URI symptoms with shortness of breath and cough for the last week has been worsening.  Today, she developed chest discomfort.  Patient had dynamic EKG changes going in and out of a left bundle branch block causing them to activated code STEMI.  Just before arrival, patient went back into a sinus rhythm with no evidence of bundle branch block and her pain resolved.  She currently denies any chest discomfort but does report  chest tightness.  She reports some nausea but no vomiting.  She reports no constipation, diarrhea, or urinary symptoms.  She denies new leg pain or leg swelling.  She chronically has some left knee pain.  She denies recent trauma.  On arrival, patient was found to be febrile with a temp of 101.4.  She is not tachycardic, tachypneic, or hypoxic.  She is not hypotensive.  Patient reports that she has been fully vaccinated against COVID-19.  On exam, patient did have rhonchi and some rales in her lungs.  No significant wheezing.  Chest and abdomen were nontender.  Good pulses in extremities.  Legs were not edematous and nontender.  Patient resting.  Cardiology came to bedside and reviewed the EKGs.  They did not feel this was a STEMI.  They would like further work-up including Covid test and a more broad work-up although patient with hypoxic and is needing to be on around 8 L nasal cannula.  EMS reports patient does not take oxygen at home normally.  She will need admission for new hypoxia.  Cardiology will likely see patient when work-up is further along.  She will get a Covid test, x-ray, and other labs.  Anticipate admission after work-up is completed.  8:13 PM CT scan was completed and did not show evidence of pulmonary embolism or large pneumonia.  Given her new oxygen requirement, I do feel she needs admission.  I am concerned about fluid overload with her BNP at 347.1.  Initial troponin is normal.  Lactic acid is not elevated.  Her Covid test was negative however I still am concerned she may be a person under investigation who was negative on initial Covid test.  We will call cardiology given the likely fluid overload with new hypoxia.   Cardiology requested she be admitted by medicine.  They will admit for further management.  It is still unclear the etiology of patient's fever.  Urinalysis showed no nitrites or leukocytes.  I still suspect patient may have Covid with a negative test.  She will  be admitted for further management of chest pain, intermittent left bundle branch block, and new hypoxia.   Final Clinical Impression(s) / ED Diagnoses Final diagnoses:  Chest pain, unspecified type  Cough  Hypoxia  Elevated brain natriuretic peptide (BNP) level    Clinical Impression: 1. Chest pain, unspecified type   2. Cough   3. Hypoxia   4. Elevated brain natriuretic peptide (BNP) level     Disposition: Admit  This note was prepared with assistance of Dragon voice recognition software. Occasional wrong-word or sound-a-like substitutions may have occurred due to the inherent limitations of voice recognition software.      Krew Hortman, Gwenyth Allegra, MD 12/23/19 [id number]

## 2019-12-23 NOTE — ED Triage Notes (Signed)
Assume care from EMS, where they report chief complaint of intermittent chest pain  And cold like s/s for a couple days. EMS states they administered 324 Aspirin ans 2 Sublingual NTG. Upon arrival no complaints of chest pain at this tine another ekg obtain however Pt o2 sats were 85% on 4L [place], increase oxygen to 7L [place] o2 sats improved to 96%. Pt is place on the monitor x 3, VSS at this time

## 2019-12-23 NOTE — H&P (Signed)
History and Physical    Cindy Brandt:[id number] DOB: February 14, 1932 DOA: 12/23/2019  PCP: [name], NP  Patient coming from: Home via EMS   Chief Complaint:   Cough, chest pain  HPI:    86 year old female with past medical history of coronary artery disease (NSTEMI in 2018, multivessel disease, last cath 10/2017), hypertension, hyperlipidemia, diastolic congestive heart failure (Echo 05/2016 EF 60-65%), prediabetes and gastroesophageal reflux disease who presents to [facility] emergency department via EMS due to complaints of cough and chest discomfort.  Patient is a rather poor historian and the majority the history has been obtained from the daughter who is at the bedside at time of interview.  The patient has been experiencing symptoms of cough and "congestion" for approximately 8 weeks, since early August.  Patient's cough has been intermittently productive with thick white mucus.  Patient's symptoms have been gradually worsening over the span of time.  Patient presented to a [place] urgent care clinic in August and at that time was placed on a course of doxycycline and prednisone without clinical improvement.  Patient has been following with her primary care provider since and due to persisting symptoms of cough with progressively worsening shortness of breath a referral to pulmonology was placed during the 10/15 appointment.  Patient's daughter explains that in the past 3 or 4 days the patient has exhibited progressively worsening lethargy and weakness.  This is worsened to the point where the patient hardly ever is out of bed due to exhaustion.  Patient shortness of breath is continue to worsen as well and is severe in intensity with minimal exertion.  Patient denies paroxysmal nocturnal dyspnea or pillow orthopnea.  Patient denies lower extremity edema.  Upon further questioning patient daughter denies any recent travel, sick contacts or confirmed contact with known  COVID-19 infection.  Patient is also been experiencing episodes of chest discomfort.  Patient describes this pain as pressure-like in quality, midsternal and typically occurring with deep inspiration or cough.  Patient does report that this pain does seem to improve with as needed nitroglycerin however.  Due to progressively worsening symptoms of shortness of breath cough and chest discomfort the patient eventually presented via EMS to [facility] emergency department.  Due to patient's history, and due to observations by EMS of an intermittent left bundle branch block there was initially concern for ACS.  However, shortly after arrival and review of the case with cardiology it was felt the patient was not suffering from acute coronary syndrome.    Upon evaluation in the emergency department patient has been found to be quite hypoxic requiring high flow nasal cannula.  Patient was also found to be febrile upon arrival at 101.4 F.  Because of patient's persisting hypoxia and fever with ongoing complaints of chest discomfort hospitalist group has been called to assess the patient for admission to the hospital.  Review of Systems:   Review of Systems  Constitutional: Positive for fever and malaise/fatigue.  Respiratory: Positive for cough and shortness of breath.   Cardiovascular: Positive for chest pain.  Neurological: Positive for weakness.  All other systems reviewed and are negative.   Past Medical History:  Diagnosis Date  . Allergy   . Anemia   . Anxiety   . Arthritis   . Blood transfusion without reported diagnosis   . Cataract    a. bilerteral cataracts removed  . Coronary atherosclerosis of native coronary artery    a. 2003 Cath/unsuccessful PCI of  occluded RCA;  b. 2005 NSTEMI/Cath: CTO RCA w/ L->R collats, LAD 20, LCX 76m, nl EF;  c. 11/2011 Myoview: no ischemia.  . Diastolic dysfunction 25/07/3974   a. 11/2011 Echo: Ejection fraction 60-65%, gr1 DD, basal inferior AK;   b. 09/2013 Echo: EF 55-60%, mild LVH, no rwma, Gr1 DD, mildly dil LA.  . Gastritis    a. 04/2016 EGD: Nl esophagus, gastritis, nl duodenal bulb & 2nd portion of the duodenum.  Marland Kitchen Headache(784.0)   . Hyperlipidemia   . Hypertension   . Hypokalemia   . Tubular adenoma of colon 2013    Past Surgical History:  Procedure Laterality Date  . CARPAL TUNNEL RELEASE Bilateral   . CHOLECYSTECTOMY  2004  . COLONOSCOPY  08/06/2011   Procedure: COLONOSCOPY;  Surgeon: [name], MD;  Location[facility];  Service: Endoscopy;  Laterality: N/A;  10:40 AM  . CORONARY ANGIOGRAPHY N/A 08/02/2017   Procedure: CORONARY ANGIOGRAPHY;  Surgeon: Lorretta Harp, MD;  Location: [place] CV LAB;  Service: Cardiovascular;  Laterality: N/A;  . GALLBLADDER SURGERY    . LEFT HEART CATH AND CORONARY ANGIOGRAPHY N/A 06/15/2016   Procedure: Left Heart Cath and Coronary Angiography;  Surgeon: Lorretta Harp, MD;  Location: [place] CV LAB;  Service: Cardiovascular;  Laterality: N/A;  . PARTIAL HYSTERECTOMY    . repair of belly button    . UMBILICAL HERNIA REPAIR     Umbilical hernia repair as a child     reports that she quit smoking about 23 years ago. Her smoking use included cigarettes. She has a 12.50 pack-year smoking history. She has never used smokeless tobacco. She reports that she does not drink alcohol and does not use drugs.  Allergies  Allergen Reactions  . Ranexa [Ranolazine] Other (See Comments)    Does NOT "agree" with the patient- does not feel like herself    Family History  Problem Relation Age of Onset  . Coronary artery disease Father   . Heart attack Father   . Colon cancer Father   . Coronary artery disease Mother   . Stomach cancer Mother   . Heart disease Brother   . Diabetes Brother   . Pancreatic cancer Neg Hx   . Rectal cancer Neg Hx   . Esophageal cancer Neg Hx      Prior to Admission medications   Medication Sig Start Date End Date Taking? Authorizing Provider    amLODipine (NORVASC) 2.5 MG tablet TAKE 1 TABLET BY MOUTH EVERY DAY Patient taking differently: Take 2.5 mg by mouth daily.  11/27/19  Yes Lorretta Harp, MD  aspirin EC 81 MG tablet Take 81 mg by mouth every morning.   Yes [provider]  atenolol (TENORMIN) 25 MG tablet Take 1 tablet (25 mg total) by mouth daily. 07/04/19  Yes Kilroy, Luke K, PA-C  atorvastatin (LIPITOR) 80 MG tablet TAKE 1 TABLET BY MOUTH EVERY DAY IN THE EVENING FOR CHOLESTEROL Patient taking differently: Take 80 mg by mouth every evening.  12/07/19  Yes [name], NP  busPIRone (BUSPAR) 7.5 MG tablet Take 1 tablet by mouth twice daily for anxiety. Patient taking differently: Take 7.5 mg by mouth 2 (two) times daily.  12/05/18  Yes [name], NP  calcium carbonate (CALCIUM 600) 600 MG TABS tablet Take 1,200 mg by mouth daily.   Yes [provider]  cetirizine (ZYRTEC) 10 MG tablet Take 10 mg by mouth daily.   Yes [provider]  clopidogrel (PLAVIX) 75 MG tablet TAKE 1 TABLET BY MOUTH EVERY DAY Patient taking differently: Take 75 mg by mouth at bedtime.  11/30/19  Yes Kilroy, Luke K, PA-C  famotidine (PEPCID) 20 MG tablet Take 20 mg by mouth daily.    Yes [provider]  gabapentin (NEURONTIN) 100 MG capsule TAKE 1 CAPSULE BY MOUTH TWICE DAILY FOR PAIN. Patient taking differently: Take 100 mg by mouth in the morning and at bedtime.  11/30/19  Yes [name], NP  isosorbide mononitrate (IMDUR) 120 MG 24 hr tablet Take 60 mg by mouth in the morning.   Yes [provider]  Melatonin 5 MG TABS Take 5 mg by mouth at bedtime as needed (sleep).    Yes [provider]  nitroGLYCERIN (NITROSTAT) 0.4 MG SL tablet PLACE 1 TABLET UNDER THE TONGUE EVERY 5 MIN AS NEEDED FOR CHEST PAIN Patient taking differently: Place 0.4 mg under the tongue every 5 (five) minutes as needed for chest pain.  08/01/19  Yes Lorretta Harp, MD  pantoprazole (PROTONIX) 40 MG tablet  TAKE 1 TABLET BY MOUTH ONCE DAILY FOR HEARTBURN. Patient taking differently: Take 40 mg by mouth daily before breakfast.  12/07/19  Yes [name], NP  PROAIR HFA 108 [phone] Base) MCG/ACT inhaler Inhale 2 puffs into the lungs every 6 (six) hours as needed for wheezing or shortness of breath.   Yes [provider]  sertraline (ZOLOFT) 100 MG tablet TAKE 1 TABLET BY MOUTH ONCE DAILY FOR DEPRESSION/ANXIETY. Patient taking differently: Take 100 mg by mouth at bedtime.  09/18/19  Yes [name], NP  spironolactone (ALDACTONE) 25 MG tablet Take 0.5 tablets (12.5 mg total) by mouth daily. 09/15/19  Yes Lorretta Harp, MD  tiotropium (SPIRIVA HANDIHALER) 18 MCG inhalation capsule Place 1 capsule (18 mcg total) into inhaler and inhale daily. 12/15/19  Yes [name], NP  albuterol (VENTOLIN HFA) 108 (90 Base) MCG/ACT inhaler Inhale 2 puffs into the lungs every 4 (four) hours as needed for wheezing or shortness of breath. Patient not taking: Reported on 12/23/2019 10/09/19   Hall-Potvin, [place], PA-C  isosorbide mononitrate (IMDUR) 60 MG 24 hr tablet Take 1.5 tablets (90 mg total) by mouth daily. Patient not taking: Reported on 12/23/2019 11/28/19   Lorretta Harp, MD  Magnesium 200 MG TABS Take 1 tablet (200 mg total) by mouth daily. Patient not taking: Reported on 12/23/2019 05/30/19   Lendon Colonel, NP  ranolazine (RANEXA) 500 MG 12 hr tablet Take 1 tablet (500 mg total) by mouth 2 (two) times daily. Patient not taking: Reported on 12/23/2019 11/28/19   Lorretta Harp, MD    Physical Exam: Vitals:   12/23/19 1738 12/23/19 1815 12/23/19 1818 12/23/19 1915  BP:  (!) 154/87  (!) 148/72  Pulse: 93 84  87  Resp: 19 20  18   Temp:      TempSrc:      SpO2: 96% 99% 96% 98%    Constitutional: Acute alert and oriented x3, no associated distress.   Skin: no rashes, no lesions, poor skin turgor noted. Eyes: Pupils are equally reactive to light.  No evidence of scleral  icterus or conjunctival pallor.  ENMT: Dry mucous membranes noted.  Posterior pharynx clear of any exudate or lesions.   Neck: normal, supple, no masses, no thyromegaly.  No evidence of jugular venous distension.   Respiratory: Notably coarse rales in the bilateral lower and mid fields.  No evidence of wheezing.  Scattered rhonchi bilaterally.  Normal respiratory effort. No accessory muscle use.  Cardiovascular: Regular rate and rhythm, no murmurs / rubs / gallops. No extremity edema. 2+ pedal pulses. No carotid bruits.  Chest: Notable anterior chest wall tenderness without evidence of crepitus or deformity.   Back:   Nontender without crepitus or deformity. Abdomen: Abdomen is soft and nontender.  No evidence of intra-abdominal masses.  Positive bowel sounds noted in all quadrants.   Musculoskeletal: No joint deformity upper and lower extremities. Good ROM, no contractures. Normal muscle tone.  Neurologic: CN 2-12 grossly intact. Sensation intact.  Patient moving all 4 extremities spontaneously.  Patient is following all commands.  Patient is responsive to verbal stimuli.   Psychiatric: Patient exhibits normal mood with appropriate affect.  Patient seems to possess insight as to their current situation.     Labs on Admission: I have personally reviewed following labs and imaging studies -   CBC: Recent Labs  Lab 12/23/19 1844  WBC 10.7*  NEUTROABS 9.7*  HGB 11.9*  HCT 36.6  MCV 104.9*  PLT 096   Basic Metabolic Panel: Recent Labs  Lab 12/23/19 1804  NA 135  K 4.6  CL 99  CO2 24  GLUCOSE 161*  BUN 17  CREATININE 0.93  CALCIUM 9.5   GFR: Estimated Creatinine Clearance: 35.4 mL/min (by C-G formula based on SCr of 0.93 mg/dL). Liver Function Tests: Recent Labs  Lab 12/23/19 1804  AST 22  ALT 15  ALKPHOS 57  BILITOT 1.1  PROT 7.6  ALBUMIN 3.4*   Recent Labs  Lab 12/23/19 1804  LIPASE 17   No results for input(s): AMMONIA in the last 168 hours. Coagulation  Profile: Recent Labs  Lab 12/23/19 1804  INR 0.9   Cardiac Enzymes: No results for input(s): CKTOTAL, CKMB, CKMBINDEX, TROPONINI in the last 168 hours. BNP (last 3 results) Recent Labs    10/27/19 1123  PROBNP 97.0   HbA1C: No results for input(s): HGBA1C in the last 72 hours. CBG: No results for input(s): GLUCAP in the last 168 hours. Lipid Profile: No results for input(s): CHOL, HDL, LDLCALC, TRIG, CHOLHDL, LDLDIRECT in the last 72 hours. Thyroid Function Tests: No results for input(s): TSH, T4TOTAL, FREET4, T3FREE, THYROIDAB in the last 72 hours. Anemia Panel: No results for input(s): VITAMINB12, FOLATE, FERRITIN, TIBC, IRON, RETICCTPCT in the last 72 hours. Urine analysis:    Component Value Date/Time   COLORURINE YELLOW 03/02/2008 1545   APPEARANCEUR CLOUDY (A) 03/02/2008 1545   LABSPEC 1.015 03/02/2008 1545   PHURINE 6.0 03/02/2008 1545   GLUCOSEU NEGATIVE 03/02/2008 1545   HGBUR NEGATIVE 03/02/2008 1545   BILIRUBINUR 1+ 04/11/2018 1436   KETONESUR TRACE (A) 03/02/2008 1545   PROTEINUR Negative 04/11/2018 1436   PROTEINUR 30 (A) 03/02/2008 1545   UROBILINOGEN 0.2 04/11/2018 1436   UROBILINOGEN 0.2 03/02/2008 1545   NITRITE Negative 04/11/2018 1436   NITRITE NEGATIVE 03/02/2008 1545   LEUKOCYTESUR Small (1+) (A) 04/11/2018 1436    Radiological Exams on Admission - Personally Reviewed: CT Angio Chest PE W and/or Wo Contrast  Result Date: 12/23/2019 CLINICAL DATA:  Positive D-dimer EXAM: CT ANGIOGRAPHY CHEST WITH CONTRAST TECHNIQUE: Multidetector CT imaging of the chest was performed using the standard protocol during bolus administration of intravenous contrast. Multiplanar CT image reconstructions and MIPs were obtained to evaluate the vascular anatomy. CONTRAST:  40mL OMNIPAQUE IOHEXOL 350 MG/ML SOLN COMPARISON:  Chest x-ray from earlier in the same day. FINDINGS: Cardiovascular: Atherosclerotic calcifications of the thoracic aorta are  noted without aneurysmal  dilatation. The degree of opacification is limited. The pulmonary artery shows a normal branching pattern bilaterally. No filling defects to suggest pulmonary emboli are identified. Coronary calcifications are seen. No cardiac enlargement is noted. Mediastinum/Nodes: Thoracic inlet is within normal limits. The esophagus as visualized is within normal limits. No mediastinal adenopathy is noted. Small hilar nodes are noted particularly on the right likely reactive in nature Lungs/Pleura: Mild bibasilar atelectatic changes are noted. No sizable effusion is seen. No sizable pulmonary nodules are noted. Upper Abdomen: No acute abnormality. Musculoskeletal: Degenerative changes of the thoracic spine are seen. No acute rib abnormality is noted. Review of the MIP images confirms the above findings. IMPRESSION: No evidence of pulmonary emboli. Mild bibasilar atelectasis. Aortic Atherosclerosis (ICD10-I70.0). Electronically Signed   By: Inez Catalina M.D.   On: 12/23/2019 19:55   DG Chest Portable 1 View  Result Date: 12/23/2019 CLINICAL DATA:  Hypoxia, short of breath EXAM: PORTABLE CHEST 1 VIEW COMPARISON:  10/27/2019 FINDINGS: Stable enlarged cardiac silhouette. Ectatic aorta. No effusion, infiltrate or pneumothorax. Chronic bronchitic markings. Hyperinflated lungs. IMPRESSION: 1. No acute findings. 2. Chronic bronchitic markings. 3. Ectatic calcified aorta. 4. Hyperinflated lungs. Electronically Signed   By: Suzy Bouchard M.D.   On: 12/23/2019 18:33    EKG: Personally reviewed.  Rhythm is normal sinus rhythm with heart rate of 94 bpm.  No dynamic ST segment changes appreciated.  Assessment/Plan Principal Problem:   Acute respiratory failure with hypoxia [facility])   Patient presenting with substantial hypoxia requiring high flow nasal cannula shortly after arrival to the emergency department  No evidence of acute pulmonary embolism on CT imaging of the chest.  Bilateral lower lobe infiltrates on CT imaging  that give the appearance of atelectasis however due to the patient's leukocytosis and fever on arrival with ongoing symptoms I feel clinically that this is likely bilateral lower lobe pneumonia  Patient does exhibit a slight elevation of BNP but clinically actually appears dry with normal JVP, dry mucous membranes, dark minimal urine output and a lack of peripheral edema   cardiology agrees that this is likely not acute congestive heart failure and recommends against providing patient with any diuretics.  Obtaining echocardiogram in the morning per cardiology recommendations  Placing patient on empiric intravenous antibiotic therapy for now  As needed bronchodilator therapy  Continuing supplemental oxygen  A room air ABG has been ordered to determine the true degree of hypoxia without supplemental oxygen however unfortunately this was performed on supplemental oxygen.  Active Problems:   Pneumonia of both lungs due to infectious organism   Intravenous antibiotic therapy with ceftriaxone and azithromycin  Blood cultures obtained   Intravenous isotonic fluids initiated  Please see remainder of assessment and plan above for associated acute hypoxic respiratory failure    Sepsis ([place])   Clinical concern for early sepsis with multiple sirs criteria including fever and leukocytosis in the setting of a suspected bilateral lower lobe pneumonia and evidence of organ dysfunction with acute hypoxic respiratory failure  Please see remainder of assessment and plan above    Atypical chest pain   Patient presenting with frequent bouts of atypical chest pain with known history of complicated multivessel coronary disease  Chest discomfort typically occurs with deep inspiration or cough.  Chest discomfort is also reproducible with palpation all of these features are extremely atypical  Per my discussion with Dr. Vickki Muff with cardiology, they agree at this point that the patient does not seem to  be suffering  from ACS.  Patient does report that her symptoms of chest discomfort do improve with as needed nitroglycerin which she has been taking in the outpatient setting for years, this will be continued.  Cardiac enzymes unremarkable up to this point.  We will continue to cycle cardiac enzymes  Monitoring patient on telemetry  Formal cardiology consultation in the morning.  Echocardiogram in the morning  Left bundle branch block   There was concern for an intermittent left bundle branch block noted on EMS telemetry strips  These were reviewed by Dr. Vickki Muff and the emergency department provider  Episodes have resolved  Patient is not felt to be suffering from ACS at this point.    Essential hypertension  Continue home regimen of antihypertensive therapy    Coronary artery disease involving native coronary artery of native heart   Please see assessment and plan above for treatment plan concerning patient's atypical chest discomfort  Monitoring patient on telemetry  Continuing home regimen of platelet therapy, statin therapy and AV nodal blocking agents    Chronic diastolic CHF (congestive heart failure) (HCC)   No clinical evidence of cardiogenic volume overload    Mixed hyperlipidemia   Continue home regimen of statin therapy    GERD without esophagitis    Continue home regimen of PPI   Code Status:  Full code Family Communication: Daughter is at bedside and has been updated on plan of care  Status is: Inpatient  Remains inpatient appropriate because:Inpatient level of care appropriate due to severity of illness   Dispo: The patient is from: Home              Anticipated d/c is to: Home              Anticipated d/c date is: 3 days              Patient currently is not medically stable to d/c.        Vernelle Emerald MD [facility] Pager [phone]  If 7PM-7AM, please contact night-coverage [email or web address] Use universal [facility] password for that web site. If you do not have the password, please call the hospital operator.  12/23/2019, 10:26 PM

## 2019-12-23 NOTE — ED Notes (Addendum)
Patient transported to CT  2133 MD at bedside evaluating pt

## 2019-12-23 NOTE — ED Notes (Signed)
ED Provider at bedside. 

## 2019-12-24 ENCOUNTER — Other Ambulatory Visit: Payer: Self-pay

## 2019-12-24 DIAGNOSIS — I25119 Atherosclerotic heart disease of native coronary artery with unspecified angina pectoris: Secondary | ICD-10-CM

## 2019-12-24 DIAGNOSIS — R079 Chest pain, unspecified: Secondary | ICD-10-CM

## 2019-12-24 DIAGNOSIS — J9601 Acute respiratory failure with hypoxia: Secondary | ICD-10-CM

## 2019-12-24 LAB — BLOOD CULTURE ID PANEL (REFLEXED) - BCID2

## 2019-12-24 LAB — CBC WITH DIFFERENTIAL/PLATELET
Abs Immature Granulocytes: 0.05 10*3/uL (ref 0.00–0.07)
Basophils Absolute: 0 10*3/uL (ref 0.0–0.1)
Basophils Relative: 0 %
Eosinophils Absolute: 0 10*3/uL (ref 0.0–0.5)
Eosinophils Relative: 0 %
HCT: 34.4 % — ABNORMAL LOW (ref 36.0–46.0)
Hemoglobin: 11.1 g/dL — ABNORMAL LOW (ref 12.0–15.0)
Immature Granulocytes: 1 %
Lymphocytes Relative: 13 %
Lymphs Abs: 1.3 10*3/uL (ref 0.7–4.0)
MCH: 34.3 pg — ABNORMAL HIGH (ref 26.0–34.0)
MCHC: 32.3 g/dL (ref 30.0–36.0)
MCV: 106.2 fL — ABNORMAL HIGH (ref 80.0–100.0)
Monocytes Absolute: 0.7 10*3/uL (ref 0.1–1.0)
Monocytes Relative: 7 %
Neutro Abs: 8.1 10*3/uL — ABNORMAL HIGH (ref 1.7–7.7)
Neutrophils Relative %: 79 %
Platelets: 227 10*3/uL (ref 150–400)
RBC: 3.24 MIL/uL — ABNORMAL LOW (ref 3.87–5.11)
RDW: 11.6 % (ref 11.5–15.5)
WBC: 10.2 10*3/uL (ref 4.0–10.5)
nRBC: 0 % (ref 0.0–0.2)

## 2019-12-24 LAB — MAGNESIUM: Magnesium: 1.8 mg/dL (ref 1.7–2.4)

## 2019-12-24 LAB — COMPREHENSIVE METABOLIC PANEL WITH GFR
ALT: 14 U/L (ref 0–44)
AST: 19 U/L (ref 15–41)
Albumin: 2.8 g/dL — ABNORMAL LOW (ref 3.5–5.0)
Alkaline Phosphatase: 54 U/L (ref 38–126)
Anion gap: 10 (ref 5–15)
BUN: 20 mg/dL (ref 8–23)
CO2: 24 mmol/L (ref 22–32)
Calcium: 8.8 mg/dL — ABNORMAL LOW (ref 8.9–10.3)
Chloride: 100 mmol/L (ref 98–111)
Creatinine, Ser: 0.89 mg/dL (ref 0.44–1.00)
GFR, Estimated: >60 mL/min
Glucose, Bld: 135 mg/dL — ABNORMAL HIGH (ref 70–99)
Potassium: 3.5 mmol/L (ref 3.5–5.1)
Sodium: 134 mmol/L — ABNORMAL LOW (ref 135–145)
Total Bilirubin: 0.7 mg/dL (ref 0.3–1.2)
Total Protein: 7 g/dL (ref 6.5–8.1)

## 2019-12-24 LAB — TROPONIN I (HIGH SENSITIVITY): Troponin I (High Sensitivity): 16 ng/L

## 2019-12-24 MED ORDER — ALUM & MAG HYDROXIDE-SIMETH 200-200-20 MG/5ML PO SUSP
30.0000 mL | ORAL | Status: DC | PRN
  Administered 2019-12-24: 30 mL via ORAL
  Filled 2019-12-24: qty 30

## 2019-12-24 MED ORDER — FUROSEMIDE 10 MG/ML IJ SOLN
20.0000 mg | Freq: Once | INTRAMUSCULAR | Status: AC
  Administered 2019-12-24: 20 mg via INTRAVENOUS
  Filled 2019-12-24: qty 2

## 2019-12-24 MED ORDER — ALBUTEROL SULFATE (2.5 MG/3ML) 0.083% IN NEBU
2.5000 mg | INHALATION_SOLUTION | RESPIRATORY_TRACT | Status: DC | PRN
  Administered 2019-12-24 – 2019-12-26 (×3): 2.5 mg via RESPIRATORY_TRACT
  Filled 2019-12-24 (×3): qty 3

## 2019-12-24 MED ORDER — RANOLAZINE ER 500 MG PO TB12
500.0000 mg | ORAL_TABLET | Freq: Two times a day (BID) | ORAL | Status: DC
  Administered 2019-12-24 – 2019-12-29 (×11): 500 mg via ORAL
  Filled 2019-12-24 (×11): qty 1

## 2019-12-24 NOTE — Progress Notes (Signed)
Cardiology consulted on this patient overnight.  Ms. Cindy Brandt is an 48F with CAD (CTO RCA with left-to-right collaterals), hypertension,, hyperlipidemia, and prior tobacco abuse admitted with chest pain and shortness of breath.  She was recently seen by Dr. Gwenlyn Found and Kerin Ransom for chest pain responsive to nitrates.  She was started on ranolazine and her Imdur was increased.  She now presents to the hospital with fever, leukocytosis, and cough.  She reports that her chest pain this admission was related to her cough and also pleuritic.  She has been treated empirically with antibioticbiotics given her elevated procalcitonin.  No focal infiltrates on CT or chest x-ray.  She is feeling much better today.  Seems at her current symptoms are related to infection rather than ischemia.  High-sensitivity troponin has been negative.  We will continue with medical management of her coronary disease for now.  She will follow up with cardiology as an outpatient as planned.  Cindy Brandt C. Oval Linsey, MD, [facility] 12/24/2019 9:32 AM

## 2019-12-24 NOTE — Progress Notes (Signed)
Daughter arrived to bedside. Updated on plan of care and sequence of events. Pt on 3L [place] with O2 sats of 98% and tolerating it well.

## 2019-12-24 NOTE — Progress Notes (Signed)
PHARMACY - PHYSICIAN COMMUNICATION CRITICAL VALUE ALERT - BLOOD CULTURE IDENTIFICATION (BCID)  Cindy Brandt is an 86 y.o. female who presented to [facility] on 12/23/2019 with a chief complaint of cough and chest discomfort.  Assessment:  Chest pain likely due to PNA and was started on abx. WBC now 10.2, Scr 0.89 (CrCl 36 mL/min). Afebrile. CRP 15.3, LA 0.8, PCT 0.52.   1/4 bottles (aerobic) growing GPC in clusters - BCID showing staph species.   Name of physician (or Provider) Contacted: Dr Aileen Fass  Current antibiotics: Ceftriaxone/azithromycin  Changes to prescribed antibiotics recommended:  Continue current antibiotics for PNA treatment - possible contaminant.   Results for orders placed or performed during the hospital encounter of 12/23/19  Blood Culture ID Panel (Reflexed) (Collected: 12/23/2019  6:04 PM)  Result Value Ref Range   Enterococcus faecalis NOT DETECTED NOT DETECTED   Enterococcus Faecium NOT DETECTED NOT DETECTED   Listeria monocytogenes NOT DETECTED NOT DETECTED   Staphylococcus species DETECTED (A) NOT DETECTED   Staphylococcus aureus (BCID) NOT DETECTED NOT DETECTED   Staphylococcus epidermidis NOT DETECTED NOT DETECTED   Staphylococcus lugdunensis NOT DETECTED NOT DETECTED   Streptococcus species NOT DETECTED NOT DETECTED   Streptococcus agalactiae NOT DETECTED NOT DETECTED   Streptococcus pneumoniae NOT DETECTED NOT DETECTED   Streptococcus pyogenes NOT DETECTED NOT DETECTED   A.calcoaceticus-baumannii NOT DETECTED NOT DETECTED   Bacteroides fragilis NOT DETECTED NOT DETECTED   Enterobacterales NOT DETECTED NOT DETECTED   Enterobacter cloacae complex NOT DETECTED NOT DETECTED   Escherichia coli NOT DETECTED NOT DETECTED   Klebsiella aerogenes NOT DETECTED NOT DETECTED   Klebsiella oxytoca NOT DETECTED NOT DETECTED   Klebsiella pneumoniae NOT DETECTED NOT DETECTED   Proteus species NOT DETECTED NOT DETECTED   Salmonella species NOT DETECTED NOT  DETECTED   Serratia marcescens NOT DETECTED NOT DETECTED   Haemophilus influenzae NOT DETECTED NOT DETECTED   Neisseria meningitidis NOT DETECTED NOT DETECTED   Pseudomonas aeruginosa NOT DETECTED NOT DETECTED   Stenotrophomonas maltophilia NOT DETECTED NOT DETECTED   Candida albicans NOT DETECTED NOT DETECTED   Candida auris NOT DETECTED NOT DETECTED   Candida glabrata NOT DETECTED NOT DETECTED   Candida krusei NOT DETECTED NOT DETECTED   Candida parapsilosis NOT DETECTED NOT DETECTED   Candida tropicalis NOT DETECTED NOT DETECTED   Cryptococcus neoformans/gattii NOT DETECTED NOT DETECTED    Antonietta Jewel, PharmD, BCCCP Clinical Pharmacist  Phone: [phone] 12/24/2019 4:50 PM  Please check AMION for all [facility] phone numbers After 10:00 PM, call [place] [phone]

## 2019-12-24 NOTE — Progress Notes (Addendum)
[facility] PROGRESS NOTE    Progress Note  SHYNIECE Brandt  TJQ:[id number] DOB: [birth year] DOA: 12/23/2019 PCP: [name], NP     Brief Narrative:   Cindy Brandt is an 86 y.o. female past medical history of coronary artery disease in 2018 with multivessel involvement with the last cath in 7622, chronic diastolic heart failure, diet-controlled diabetes mellitus comes into the hospital for cough and chest discomfort  Assessment/Plan:   Acute respiratory failure with hypoxia ([place]) multifocal bilateral pneumonia/sepsis: She was started empirically on admission on IV Rocephin and azithromycin she has defervesced her leukocytosis is improved. Culture data has been sent, procalcitonin is elevated.  We will continue current regimen of IV antibiotics and IV fluid. Check a CBC with differential tomorrow morning. She appears euvolemic on physical exam, discontinue IV fluids and IV Lasix, she is not in heart failure this is all multifocal pneumonia  Pleuritic chest pain: Her chest pain is pleuritic likely due to pneumonia. Twelve-lead EKG showed no signs of ACS cardiac biomarkers are negative and her chest pain is pleuritic discontinue troponins.  Coronary artery disease involving the native heart: Twelve-lead EKG showed no signs of ischemia, cardiac biomarkers basically flat we will stop checking them.  Chronic diastolic heart failure: She appears dry on physical exam she was fluid resuscitated on admission. We will restart her home medications, except for her diuretic therapy.  Mixed lipidemia: Continue statins.  GERD: Without esophagitis continue home PPI.   DVT prophylaxis: lovenox Family Communication:none Status is: Inpatient  Remains inpatient appropriate because:Hemodynamically unstable   Dispo: The patient is from: Home              Anticipated d/c is to: Home              Anticipated d/c date is: 2 days              Patient currently is not medically  stable to d/c.        Code Status:     Code Status Orders  (From admission, onward)         Start     Ordered   12/23/19 2245  Full code  Continuous        12/23/19 2244        Code Status History    Date Active Date Inactive Code Status Order ID Comments User Context   08/02/2017 1432 08/02/2017 2118 Full Code [id number]  Lorretta Harp, MD Inpatient   06/14/2016 0910 06/20/2016 1349 Full Code [id number]  Rogelia Mire, NP Inpatient   10/29/2013 0118 10/30/2013 1550 Full Code [id number]  Oswald Hillock, MD Inpatient   11/23/2011 1353 11/25/2011 2151 Full Code [id number]  Delfina Redwood, MD Inpatient   Advance Care Planning Activity        IV Access:    Peripheral IV   Procedures and diagnostic studies:   CT Angio Chest PE W and/or Wo Contrast  Result Date: 12/23/2019 CLINICAL DATA:  Positive D-dimer EXAM: CT ANGIOGRAPHY CHEST WITH CONTRAST TECHNIQUE: Multidetector CT imaging of the chest was performed using the standard protocol during bolus administration of intravenous contrast. Multiplanar CT image reconstructions and MIPs were obtained to evaluate the vascular anatomy. CONTRAST:  22mL OMNIPAQUE IOHEXOL 350 MG/ML SOLN COMPARISON:  Chest x-ray from earlier in the same day. FINDINGS: Cardiovascular: Atherosclerotic calcifications of the thoracic aorta are noted without aneurysmal dilatation. The degree of opacification is limited. The pulmonary artery shows a normal branching pattern bilaterally. No  filling defects to suggest pulmonary emboli are identified. Coronary calcifications are seen. No cardiac enlargement is noted. Mediastinum/Nodes: Thoracic inlet is within normal limits. The esophagus as visualized is within normal limits. No mediastinal adenopathy is noted. Small hilar nodes are noted particularly on the right likely reactive in nature Lungs/Pleura: Mild bibasilar atelectatic changes are noted. No sizable effusion is seen. No sizable pulmonary nodules are  noted. Upper Abdomen: No acute abnormality. Musculoskeletal: Degenerative changes of the thoracic spine are seen. No acute rib abnormality is noted. Review of the MIP images confirms the above findings. IMPRESSION: No evidence of pulmonary emboli. Mild bibasilar atelectasis. Aortic Atherosclerosis (ICD10-I70.0). Electronically Signed   By: Inez Catalina M.D.   On: 12/23/2019 19:55   DG Chest Portable 1 View  Result Date: 12/23/2019 CLINICAL DATA:  Hypoxia, short of breath EXAM: PORTABLE CHEST 1 VIEW COMPARISON:  10/27/2019 FINDINGS: Stable enlarged cardiac silhouette. Ectatic aorta. No effusion, infiltrate or pneumothorax. Chronic bronchitic markings. Hyperinflated lungs. IMPRESSION: 1. No acute findings. 2. Chronic bronchitic markings. 3. Ectatic calcified aorta. 4. Hyperinflated lungs. Electronically Signed   By: Suzy Bouchard M.D.   On: 12/23/2019 18:33     Medical Consultants:    None.  Anti-Infectives:   IV Rocephin and azithromycin  Subjective:    [name] relates she still has pleuritic chest pain cough and shortness of breath does not feel any better than yesterday.  Objective:    Vitals:   12/23/19 1915 12/24/19 0010 12/24/19 0130 12/24/19 0345  BP: (!) 148/72 134/69 109/61 (!) 141/73  Pulse: 87 78 73 83  Resp: 18 20 20 20   Temp: (!) 100.7 F (38.2 C) (!) 100.9 F (38.3 C) 98.6 F (37 C) 98.4 F (36.9 C)  TempSrc: Oral Oral    SpO2: 98% 100% 99% 96%  Weight:   63.2 kg   Height:   4\' 11"  (1.499 m)    SpO2: 96 % O2 Flow Rate (L/min): 4 L/min   Intake/Output Summary (Last 24 hours) at 12/24/2019 0725 Last data filed at 12/24/2019 0509 Gross per 24 hour  Intake 100 ml  Output 250 ml  Net -150 ml   Filed Weights   12/24/19 0130  Weight: 63.2 kg    Exam: General exam: In no acute distress. Respiratory system: Good air movement and diffuse crackles at bases. Cardiovascular system: S1 & S2 heard, RRR. No JVD. Gastrointestinal system: Abdomen is  nondistended, soft and nontender.  Extremities: No pedal edema. Skin: No rashes, lesions or ulcers Psychiatry: Judgement and insight appear normal. Mood & affect appropriate.    Data Reviewed:    Labs: Basic Metabolic Panel: Recent Labs  Lab 12/23/19 1804 12/23/19 1804 12/23/19 2257 12/24/19 0441  NA 135  --  136 134*  K 4.6   < > 4.3 3.5  CL 99  --   --  100  CO2 24  --   --  24  GLUCOSE 161*  --   --  135*  BUN 17  --   --  20  CREATININE 0.93  --   --  0.89  CALCIUM 9.5  --   --  8.8*  MG  --   --   --  1.8   < > = values in this interval not displayed.   GFR Estimated Creatinine Clearance: 36.7 mL/min (by C-G formula based on SCr of 0.89 mg/dL). Liver Function Tests: Recent Labs  Lab 12/23/19 1804 12/24/19 0441  AST 22 19  ALT 15 14  ALKPHOS 57 54  BILITOT 1.1 0.7  PROT 7.6 7.0  ALBUMIN 3.4* 2.8*   Recent Labs  Lab 12/23/19 1804  LIPASE 17   No results for input(s): AMMONIA in the last 168 hours. Coagulation profile Recent Labs  Lab 12/23/19 1804  INR 0.9   COVID-19 Labs  Recent Labs    12/23/19 1804 12/23/19 2235  DDIMER 1.50*  --   CRP  --  15.3*    Lab Results  Component Value Date   SARSCOV2NAA NEGATIVE 12/23/2019    CBC: Recent Labs  Lab 12/23/19 1844 12/23/19 2257 12/24/19 0441  WBC 10.7*  --  10.2  NEUTROABS 9.7*  --  8.1*  HGB 11.9* 11.2* 11.1*  HCT 36.6 33.0* 34.4*  MCV 104.9*  --  106.2*  PLT 223  --  227   Cardiac Enzymes: No results for input(s): CKTOTAL, CKMB, CKMBINDEX, TROPONINI in the last 168 hours. BNP (last 3 results) Recent Labs    10/27/19 1123  PROBNP 97.0   CBG: No results for input(s): GLUCAP in the last 168 hours. D-Dimer: Recent Labs    12/23/19 1804  DDIMER 1.50*   Hgb A1c: No results for input(s): HGBA1C in the last 72 hours. Lipid Profile: No results for input(s): CHOL, HDL, LDLCALC, TRIG, CHOLHDL, LDLDIRECT in the last 72 hours. Thyroid function studies: No results for input(s):  TSH, T4TOTAL, T3FREE, THYROIDAB in the last 72 hours.  Invalid input(s): FREET3 Anemia work up: No results for input(s): VITAMINB12, FOLATE, FERRITIN, TIBC, IRON, RETICCTPCT in the last 72 hours. Sepsis Labs: Recent Labs  Lab 12/23/19 1804 12/23/19 1844 12/23/19 1943 12/23/19 2235 12/24/19 0441  PROCALCITON  --   --   --  0.52  --   WBC  --  10.7*  --   --  10.2  LATICACIDVEN 1.6  --  0.8  --   --    Microbiology Recent Results (from the past 240 hour(s))  Respiratory Panel by RT PCR (Flu A&B, Covid) - Nasopharyngeal Swab     Status: None   Collection Time: 12/23/19  5:29 PM   Specimen: Nasopharyngeal Swab  Result Value Ref Range Status   SARS Coronavirus 2 by RT PCR NEGATIVE NEGATIVE Final    Comment: (NOTE) SARS-CoV-2 target nucleic acids are NOT DETECTED.  The SARS-CoV-2 RNA is generally detectable in upper respiratoy specimens during the acute phase of infection. The lowest concentration of SARS-CoV-2 viral copies this assay can detect is 131 copies/mL. A negative result does not preclude SARS-Cov-2 infection and should not be used as the sole basis for treatment or other patient management decisions. A negative result may occur with  improper specimen collection/handling, submission of specimen other than nasopharyngeal swab, presence of viral mutation(s) within the areas targeted by this assay, and inadequate number of viral copies (<131 copies/mL). A negative result must be combined with clinical observations, patient history, and epidemiological information. The expected result is Negative.  Fact Sheet for Patients:  PinkCheek.be  Fact Sheet for Healthcare Providers:  GravelBags.it  This test is no t yet approved or cleared by the [place] FDA and  has been authorized for detection and/or diagnosis of SARS-CoV-2 by FDA under an Emergency Use Authorization (EUA). This EUA will remain  in effect  (meaning this test can be used) for the duration of the COVID-19 declaration under Section 564(b)(1) of the Act, 21 U.S.C. section 360bbb-3(b)(1), unless the authorization is terminated or revoked sooner.     Influenza A by PCR NEGATIVE  NEGATIVE Final   Influenza B by PCR NEGATIVE NEGATIVE Final    Comment: (NOTE) The Xpert Xpress SARS-CoV-2/FLU/RSV assay is intended as an aid in  the diagnosis of influenza from Nasopharyngeal swab specimens and  should not be used as a sole basis for treatment. Nasal washings and  aspirates are unacceptable for Xpert Xpress SARS-CoV-2/FLU/RSV  testing.  Fact Sheet for Patients: PinkCheek.be  Fact Sheet for Healthcare Providers: GravelBags.it  This test is not yet approved or cleared by the [place] FDA and  has been authorized for detection and/or diagnosis of SARS-CoV-2 by  FDA under an Emergency Use Authorization (EUA). This EUA will remain  in effect (meaning this test can be used) for the duration of the  Covid-19 declaration under Section 564(b)(1) of the Act, 21  U.S.C. section 360bbb-3(b)(1), unless the authorization is  terminated or revoked. Performed at [facility], [place] [address], [place], [place] [address]      Medications:   . amLODipine  2.5 mg Oral Daily  . aspirin EC  81 mg Oral q morning - 10a  . atenolol  25 mg Oral Daily  . atorvastatin  80 mg Oral QPM  . busPIRone  7.5 mg Oral BID  . clopidogrel  75 mg Oral QHS  . enoxaparin (LOVENOX) injection  40 mg Subcutaneous Daily  . famotidine  20 mg Oral Daily  . gabapentin  100 mg Oral BID  . isosorbide mononitrate  60 mg Oral q AM  . loratadine  10 mg Oral Daily  . pantoprazole  40 mg Oral QAC breakfast  . sertraline  100 mg Oral QHS  . umeclidinium bromide  1 puff Inhalation Daily   Continuous Infusions: . azithromycin Stopped (12/24/19 0140)  . cefTRIAXone (ROCEPHIN)  IV Stopped (12/24/19 0008)    . lactated ringers 100 mL/hr at 12/23/19 2329      LOS: 1 day   Charlynne Cousins  [facility]  12/24/2019, 7:25 AM

## 2019-12-24 NOTE — ED Notes (Signed)
Pt admitted to 3E20; report called to [facility], [place].

## 2019-12-24 NOTE — Progress Notes (Signed)
Patient admitted to the floor from ED, alert and oriented times a four denies chest pain/pressure at this time.. Oriented to room/unit policies, skin assesesement completed by 2 RN''s, myself and Patricia Nettle. Admission assesement questions not fully completed since pt states that her hearing is impaired and do not have her hearing aids.  Instructed pt. to request family to bring her hearing aids when they come to visit at the hospital.

## 2019-12-24 NOTE — Consult Note (Signed)
CARDIOLOGY CONSULT NOTE  Patient ID: Cindy Brandt MRN: [id number] DOB/AGE: 04-18-1931 86 y.o.  Admit date: 12/23/2019 Primary Physician [name], NP Primary Cardiologist Dr. Gwenlyn Found Chief Complaint  Chest Pain Requesting  ED  HPI:    Cindy Brandt is an 86 year old female with multivessel CAD, hypertension, dyslipidemia, HFpEF, remote smoking history, remote smoking history (smoked for over 30 years) and gastroesophageal reflux disease who presents to the [facility] emergency department for evaluation of shortness of breath and chest discomfort. Cardiology is now consulted for transient LBBB on ECG in the setting of her chest pain.   Cindy Brandt is accompanied by one of her daughters who helps to fill in some of the details of the patient's story. Cindy Brandt reports that over the past 3 or 4 days she has experienced worsening weakness and has largely been confined to her bed and bedside commode. Along with this weakness, she has noticed some shortness of breath, cough, and chest discomfort. She reports that her chest discomfort worsens with cough or when she rolls over in bed. She has been taking SLNTG which intermittently helps. She has not noticed any weight gain, leg swelling, or PND/orthopnea. She was last seen in cardiology clinic on 11/28/19. At that appointment, it was documented that was reporting increased frequency of nitro-responsive chest pain, resorting to its use several times a week. Her Imdur was increased to 90 mg daily and was started on ranolazine 500 mg BID. If she failed this medical therapy, then the plan was to discuss CTO intervention.  Her cough and shortness of breath symptoms have been going on for much longer. She has been experiencing these symptoms for over two months now. Her cough is sometimes productive. She is being evaluated by her PCP and was recently started on Spiriva for her cough/congestion given her smoking history; she was also referred to see outpatient  pulmonology.   Given the symptoms above, EMS was called at the behest of her daughters. Code STEMI was activated given EMS 12-lead showing the development of new-onset LBBB. On arrival to the ED, patient's chest pain had resolved and so had the LBBB on ECG which now showed NSR. She was however noted to be hypoxic and febrile (Tmax 101.9F). Lab workup in the ED revealed trop 17 x2, normal lactate, CRP 15.3, WBC 10.7, Hgb 11.9, BNP 347.1.      Past Medical History:  Diagnosis Date  . Allergy   . Anemia   . Anxiety   . Arthritis   . Blood transfusion without reported diagnosis   . Cataract    a. bilerteral cataracts removed  . Coronary atherosclerosis of native coronary artery    a. 2003 Cath/unsuccessful PCI of occluded RCA;  b. 2005 NSTEMI/Cath: CTO RCA w/ L->R collats, LAD 20, LCX 28m, nl EF;  c. 11/2011 Myoview: no ischemia.  . Diastolic dysfunction 81/44/8185   a. 11/2011 Echo: Ejection fraction 60-65%, gr1 DD, basal inferior AK;  b. 09/2013 Echo: EF 55-60%, mild LVH, no rwma, Gr1 DD, mildly dil LA.  . Gastritis    a. 04/2016 EGD: Nl esophagus, gastritis, nl duodenal bulb & 2nd portion of the duodenum.  Marland Kitchen Headache(784.0)   . Hyperlipidemia   . Hypertension   . Hypokalemia   . Tubular adenoma of colon 2013    Past Surgical History:  Procedure Laterality Date  . CARPAL TUNNEL RELEASE Bilateral   . CHOLECYSTECTOMY  2004  . COLONOSCOPY  08/06/2011   Procedure: COLONOSCOPY;  Surgeon: Carlyon Prows  Rexene Edison, MD;  Location: [facility];  Service: Endoscopy;  Laterality: N/A;  10:40 AM  . CORONARY ANGIOGRAPHY N/A 08/02/2017   Procedure: CORONARY ANGIOGRAPHY;  Surgeon: Lorretta Harp, MD;  Location: [facility];  Service: Cardiovascular;  Laterality: N/A;  . GALLBLADDER SURGERY    . LEFT HEART CATH AND CORONARY ANGIOGRAPHY N/A 06/15/2016   Procedure: Left Heart Cath and Coronary Angiography;  Surgeon: Lorretta Harp, MD;  Location: [employer] [facility];  Service: Cardiovascular;   Laterality: N/A;  . PARTIAL HYSTERECTOMY    . repair of belly button    . UMBILICAL HERNIA REPAIR     Umbilical hernia repair as a child    Allergies  Allergen Reactions  . Ranexa [Ranolazine] Other (See Comments)    Does NOT "agree" with the patient- does not feel like herself   (Not in a hospital admission)  Family History  Problem Relation Age of Onset  . Coronary artery disease Father   . Heart attack Father   . Colon cancer Father   . Coronary artery disease Mother   . Stomach cancer Mother   . Heart disease Brother   . Diabetes Brother   . Pancreatic cancer Neg Hx   . Rectal cancer Neg Hx   . Esophageal cancer Neg Hx     Social History   Socioeconomic History  . Marital status: Widowed    Spouse name: Not on file  . Number of children: 5  . Years of education: Not on file  . Highest education level: Not on file  Occupational History  . Occupation: Retired  Tobacco Use  . Smoking status: Former Smoker    Packs/day: 0.50    Years: 25.00    Pack years: 12.50    Types: Cigarettes    Quit date: 04/06/1996    Years since quitting: 23.7  . Smokeless tobacco: Never Used  Substance and Sexual Activity  . Alcohol use: No  . Drug use: No  . Sexual activity: Not on file  Other Topics Concern  . Not on file  Social History Narrative   Currently living with dtr in [place].  No regular exercise.   Social Determinants of Health   Financial Resource Strain:   . Difficulty of Paying Living Expenses: Not on file  Food Insecurity:   . Worried About Charity fundraiser in the Last Year: Not on file  . Ran Out of Food in the Last Year: Not on file  Transportation Needs:   . Lack of Transportation (Medical): Not on file  . Lack of Transportation (Non-Medical): Not on file  Physical Activity:   . Days of Exercise per Week: Not on file  . Minutes of Exercise per Session: Not on file  Stress:   . Feeling of Stress : Not on file  Social Connections:   . Frequency of  Communication with Friends and Family: Not on file  . Frequency of Social Gatherings with Friends and Family: Not on file  . Attends Religious Services: Not on file  . Active Member of Clubs or Organizations: Not on file  . Attends Archivist Meetings: Not on file  . Marital Status: Not on file  Intimate Partner Violence:   . Fear of Current or Ex-Partner: Not on file  . Emotionally Abused: Not on file  . Physically Abused: Not on file  . Sexually Abused: Not on file     Review of Systems: [y] = yes, [ ]  =  no       General: Weight gain [] ; Weight loss [ ] ; Anorexia [ ] ; Fatigue [ ] ; Fever [ ] ; Chills [ ] ; Weakness [ ]     Cardiac: Chest pain/pressure [ ] ; Resting SOB [ ] ; Exertional SOB [ ] ; Orthopnea [ ] ; Pedal Edema [ ] ; Palpitations [ ] ; Syncope [ ] ; Presyncope [ ] ; Paroxysmal nocturnal dyspnea[ ]     Pulmonary: Cough [ ] ; Wheezing[ ] ; Hemoptysis[ ] ; Sputum [ ] ; Snoring [ ]     GI: Vomiting[ ] ; Dysphagia[ ] ; Melena[ ] ; Hematochezia [ ] ; Heartburn[ ] ; Abdominal pain [ ] ; Constipation [ ] ; Diarrhea [ ] ; BRBPR [ ]     GU: Hematuria[ ] ; Dysuria [ ] ; Nocturia[ ]   Vascular: Pain in legs with walking [ ] ; Pain in feet with lying flat [ ] ; Non-healing sores [ ] ; Stroke [ ] ; TIA [ ] ; Slurred speech [ ] ;    Neuro: Headaches[ ] ; Vertigo[ ] ; Seizures[ ] ; Paresthesias[ ] ;Blurred vision [ ] ; Diplopia [ ] ; Vision changes [ ]     Ortho/Skin: Arthritis [ ] ; Joint pain [ ] ; Muscle pain [ ] ; Joint swelling [ ] ; Back Pain [ ] ; Rash [ ]     Psych: Depression[ ] ; Anxiety[ ]     Heme: Bleeding problems [ ] ; Clotting disorders [ ] ; Anemia [ ]     Endocrine: Diabetes [ ] ; Thyroid dysfunction[ ]   Physical Exam: Blood pressure 134/69, pulse 78, temperature (!) 100.9 F (38.3 C), temperature source Oral, resp. rate 20, SpO2 100 %.   GENERAL: Vital signs reviewed, Well appearing, Patient with mild respiratory distress; Alert. HEENT:  normocephalic, atraumatic  NECK:  supple , normal  inspection. CARD:  regular rate and rhythm, heart sounds normal, normal JVP RESP:  Faint bibasilar crackles (L > R), intercostal retractions noted with expiratory wheezing ABD: soft, nontender to palpation, non distended EXT: Warm and well perfused, no lower extremity edema NEURO: No focal deficits PSYCH: mood/affect normal.  Labs: Lab Results  Component Value Date   BUN 17 12/23/2019   Lab Results  Component Value Date   CREATININE 0.93 12/23/2019   Lab Results  Component Value Date   NA 136 12/23/2019   K 4.3 12/23/2019   CL 99 12/23/2019   CO2 24 12/23/2019   Lab Results  Component Value Date   TROPONINI 0.57 (HH) 06/14/2016   Lab Results  Component Value Date   WBC 10.7 (H) 12/23/2019   HGB 11.2 (L) 12/23/2019   HCT 33.0 (L) 12/23/2019   MCV 104.9 (H) 12/23/2019   PLT 223 12/23/2019   Lab Results  Component Value Date   CHOL 118 12/05/2018   HDL 54.10 12/05/2018   LDLCALC 51 12/05/2018   TRIG 64.0 12/05/2018   CHOLHDL 2 12/05/2018   Lab Results  Component Value Date   ALT 15 12/23/2019   AST 22 12/23/2019   ALKPHOS 57 12/23/2019   BILITOT 1.1 12/23/2019      Radiology:    CXR - 1. No acute findings. 2. Chronic bronchitic markings. 3. Ectatic calcified aorta. 4. Hyperinflated lungs.  CT-PE - No evidence of pulmonary emboli  LHC 08/02/17  Ost LM lesion is 45% stenosed.  Prox LAD to Mid LAD lesion is 50% stenosed.  Ost Ramus lesion is 95% stenosed.  Ost Cx to Prox Cx lesion is 100% stenosed.  Mid RCA lesion is 100% stenosed.     ASSESSMENT AND PLAN:   Cindy Brandt is an 85 year old female with multivessel CAD, hypertension, dyslipidemia, HFpEF, remote smoking history, remote  smoking history (smoked for over 30 years) and gastroesophageal reflux disease who presents to the [facility] emergency department for evaluation of shortness of breath and chest discomfort. Cardiology is now consulted for transient LBBB on ECG in the setting of her chest  pain.  Patient certainly has reasons to have chest pain and intermittent LBBB given her extensive coronary history. She is fortunately now chest pain free with normal troponin and resolution of her ECG changes. More puzzling is her respiratory complaints and fever. She does not appear to be grossly volume overloaded. Although her BNP is up, I suspect her respiratory complaints to be of the infectious etiology, rather than HF related. I think that she needs workup of her fever and agree with empiric antibiotics for the time being.  From a cardiovascular standpoint, her intermittent LBBB is concerning and deserves further risk/benefit discussion between her primary cardiologist, Cindy Brandt, and her family members given her lack of great options.  - Can hold off on heparin IV  - Cont home GDMT and antiangial therapies as tolerated from a BP standpoint - Further symptom management with SLNTG prn - Trend trop and serial ECGs; monitor on telemetry - TTE in AM - Strict I&Os and daily weights  - Will trial spot dose of Lasix to see if this helps her respiratory status somewhat  - Defer workup of fever to wonderful colleagues on the hospitalist team    Signed: Clois Dupes 12/24/2019, 1:21 AM

## 2019-12-24 NOTE — Progress Notes (Addendum)
Patient report chest pain , 7/10, BP 152/96, HR 91, SPO2 stas  93% on 4L [place]. EKG done, given maalox, reported chest pain is resolving before she received nitro tab, monitoring closely, notified oncall hospitalist.

## 2019-12-25 ENCOUNTER — Inpatient Hospital Stay (HOSPITAL_COMMUNITY): Payer: Medicare Other

## 2019-12-25 LAB — BASIC METABOLIC PANEL
Anion gap: 9 (ref 5–15)
BUN: 24 mg/dL — ABNORMAL HIGH (ref 8–23)
CO2: 26 mmol/L (ref 22–32)
Calcium: 8.5 mg/dL — ABNORMAL LOW (ref 8.9–10.3)
Chloride: 101 mmol/L (ref 98–111)
Creatinine, Ser: 1.02 mg/dL — ABNORMAL HIGH (ref 0.44–1.00)
GFR, Estimated: 54 mL/min — ABNORMAL LOW (ref >60)
Glucose, Bld: 205 mg/dL — ABNORMAL HIGH (ref 70–99)
Potassium: 4.1 mmol/L (ref 3.5–5.1)
Sodium: 136 mmol/L (ref 135–145)

## 2019-12-25 LAB — CBC WITH DIFFERENTIAL/PLATELET
Abs Immature Granulocytes: 0.02 10*3/uL (ref 0.00–0.07)
Basophils Absolute: 0 10*3/uL (ref 0.0–0.1)
Basophils Relative: 0 %
Eosinophils Absolute: 0.2 10*3/uL (ref 0.0–0.5)
Eosinophils Relative: 4 %
HCT: 32.9 % — ABNORMAL LOW (ref 36.0–46.0)
Hemoglobin: 10.7 g/dL — ABNORMAL LOW (ref 12.0–15.0)
Immature Granulocytes: 0 %
Lymphocytes Relative: 14 %
Lymphs Abs: 0.8 10*3/uL (ref 0.7–4.0)
MCH: 34.9 pg — ABNORMAL HIGH (ref 26.0–34.0)
MCHC: 32.5 g/dL (ref 30.0–36.0)
MCV: 107.2 fL — ABNORMAL HIGH (ref 80.0–100.0)
Monocytes Absolute: 0.5 10*3/uL (ref 0.1–1.0)
Monocytes Relative: 9 %
Neutro Abs: 4.2 10*3/uL (ref 1.7–7.7)
Neutrophils Relative %: 73 %
Platelets: 263 10*3/uL (ref 150–400)
RBC: 3.07 MIL/uL — ABNORMAL LOW (ref 3.87–5.11)
RDW: 11.6 % (ref 11.5–15.5)
WBC: 5.7 10*3/uL (ref 4.0–10.5)
nRBC: 0 % (ref 0.0–0.2)

## 2019-12-25 LAB — HEMOGLOBIN A1C
Hgb A1c MFr Bld: 6.6 % — ABNORMAL HIGH (ref 4.8–5.6)
Mean Plasma Glucose: 143 mg/dL

## 2019-12-25 LAB — URINE CULTURE: Culture: NO GROWTH

## 2019-12-25 MED ORDER — FUROSEMIDE 10 MG/ML IJ SOLN
INTRAMUSCULAR | Status: AC
  Filled 2019-12-25: qty 2

## 2019-12-25 MED ORDER — DICLOFENAC SODIUM 1 % EX GEL
2.0000 g | Freq: Four times a day (QID) | CUTANEOUS | Status: DC | PRN
  Administered 2019-12-25: 2 g via TOPICAL
  Filled 2019-12-25: qty 100

## 2019-12-25 MED ORDER — FUROSEMIDE 10 MG/ML IJ SOLN
20.0000 mg | Freq: Once | INTRAMUSCULAR | Status: AC
  Administered 2019-12-25: 20 mg via INTRAVENOUS

## 2019-12-25 NOTE — Progress Notes (Signed)
Nutrition Brief Note  Patient identified on the Malnutrition Screening Tool (MST) Report  Wt Readings from Last 15 Encounters:  12/25/19 63 kg  12/15/19 64.4 kg  11/28/19 64.2 kg  10/27/19 63 kg  09/14/19 64.6 kg  08/31/19 64.9 kg  08/15/19 63 kg  07/04/19 64.9 kg  06/26/19 65.1 kg  06/22/19 65.8 kg  05/22/19 65.1 kg  12/09/18 64.2 kg  12/05/18 65.5 kg  11/30/18 64.9 kg  09/21/18 61.1 kg   86 year old female with past medical history of coronary artery disease (NSTEMI in 2018, multivessel disease, last cath 10/2017), hypertension, hyperlipidemia, diastolic congestive heart failure (Echo 05/2016 EF 60-65%), prediabetes and gastroesophageal reflux disease who presents to [facility] emergency department via EMS due to complaints of cough and chest discomfort.  Pt admitted with pneumonia.  Reviewed I/O's: +1.1 L x 24 hours and +934 ml since admission  UOP: 800 ml x 24 hours  Spoke with pt and daughter at bedside. Both confirm pt with good appetite. Per pt, she typically consumes 3 meals and 1-2 snacks per day (Breakfast: oatmeal or cereal and eggs; Lunch: soup and sandwich; Dinner: meat, starch, and vegetable; Snacks: fruit, pringles, muffins). Pt reports she has been eating about 50-75%of her meals since being here and has not difficulty tolerating food despite missing her bottom dentures.   Pt and daughter deny any weight loss.   Nutrition-Focused physical exam completed. Findings are no fat depletion, no muscle depletion, and no edema.   Discussed importance of good meal and supplement intake to promote healing. Pt and daughter have no further questions, but express appreciation for visit.   Labs reviewed: Na: 134.   Current diet order is Heart Healthy, patient is consuming approximately 50-75% of meals at this time. Labs and medications reviewed.   No nutrition interventions warranted at this time. If nutrition issues arise, please consult RD.   Loistine Chance, RD, LDN,  [place] Registered Dietitian II Certified Diabetes Care and Education Specialist Please refer to [facility] for RD and/or RD on-call/weekend/after hours pager

## 2019-12-25 NOTE — Plan of Care (Signed)
  Problem: Clinical Measurements: Goal: Ability to maintain clinical measurements within normal limits will improve Outcome: Progressing   

## 2019-12-25 NOTE — Progress Notes (Signed)
   Chest pain improved overnight on therapy for pneumonia/sepsis - plan continue medical therapy as per Dr. Blenda Mounts recommendations.  [facility] will sign off.   Medication Recommendations:  Continue current meds Other recommendations (labs, testing, etc):  none Follow up as an outpatient:  Dr. Larae Grooms, MD, [facility], [place] Director of the Advanced Lipid Disorders &  Cardiovascular Risk Reduction Clinic Diplomate of the American Board of Clinical Lipidology Attending Cardiologist  Direct Dial: [phone]  Fax: [phone]  Website:  [email or web address]

## 2019-12-25 NOTE — Evaluation (Signed)
Physical Therapy Evaluation Patient Details Name: Cindy Brandt MRN: [id number] DOB: [birth year] Today's Date: 12/25/2019   History of Present Illness  pt is an 86 y.o. female past medical history of coronary artery disease in 2018 with multivessel involvement with the last cath in 8101, chronic diastolic heart failure, diet-controlled diabetes mellitus comes into the hospital for cough and chest discomfort  Clinical Impression  Pt participated in evaluation but limited due to drop in BP and becoming symptomatic; pt states she was I with ambulation and stair negotiation prior to arrival wtihout use of AD; pt requiring use of w/c for MD appointments (uses a friends); pt demonstrating deficits in strength, coordination, endurance and safety at this time and will benefit from skilled PT to address; at this time anticipate pt will be able to d/c home with family assist and HHPT    Follow Up Recommendations Home health PT;Supervision/Assistance - 24 hour    Equipment Recommendations  None recommended by PT    Recommendations for Other Services       Precautions / Restrictions Precautions Precautions: Fall Restrictions Weight Bearing Restrictions: No      Mobility  Bed Mobility Overal bed mobility: Needs Assistance Bed Mobility: Supine to Sit;Sit to Supine     Supine to sit: Mod assist;HOB elevated Sit to supine: Min assist   General bed mobility comments: attempted to scoot up in bed sitting EOB but pt requested to return to supine and bridge up wtihout assist from therapist    Transfers                    Ambulation/Gait                Stairs            Wheelchair Mobility    Modified Rankin (Stroke Patients Only)       Balance Overall balance assessment: Needs assistance Sitting-balance support: Single extremity supported Sitting balance-Leahy Scale: Fair                                       Pertinent Vitals/Pain Pain  Assessment: No/denies pain    Home Living Family/patient expects to be discharged to:: Private residence Living Arrangements: Children Available Help at Discharge: Family;Available PRN/intermittently Type of Home: House Home Access: Stairs to enter   Entrance Stairs-Number of Steps: 1 Home Layout: Two level Home Equipment: Walker - 2 wheels;Shower seat;Grab bars - tub/shower      Prior Function Level of Independence: Independent               Hand Dominance   Dominant Hand: Right    Extremity/Trunk Assessment   Upper Extremity Assessment Upper Extremity Assessment: Overall WFL for tasks assessed    Lower Extremity Assessment Lower Extremity Assessment: Generalized weakness    Cervical / Trunk Assessment Cervical / Trunk Assessment: Kyphotic  Communication   Communication: HOH  Cognition Arousal/Alertness: Awake/alert Behavior During Therapy: WFL for tasks assessed/performed Overall Cognitive Status: Within Functional Limits for tasks assessed                                        General Comments General comments (skin integrity, edema, etc.): unable to stand due to drop in BP and pt becoming symptomatic    Exercises  Assessment/Plan    PT Assessment Patient needs continued PT services  PT Problem List Decreased strength;Decreased mobility;Decreased coordination;Decreased activity tolerance;Decreased balance       PT Treatment Interventions Therapeutic exercise;Gait training;Balance training;Stair training;Functional mobility training;Therapeutic activities    PT Goals (Current goals can be found in the Care Plan section)  Acute Rehab PT Goals Patient Stated Goal: To go home PT Goal Formulation: With patient/family Time For Goal Achievement: 01/08/20 Potential to Achieve Goals: Good    Frequency Min 3X/week   Barriers to discharge        Brandt-evaluation               AM-PAC PT "6 Clicks" Mobility  Outcome Measure  Help needed turning from your back to your side while in a flat bed without using bedrails?: None Help needed moving from lying on your back to sitting on the side of a flat bed without using bedrails?: A Lot Help needed moving to and from a bed to a chair (including a wheelchair)?: A Little Help needed standing up from a chair using your arms (e.g., wheelchair or bedside chair)?: A Little Help needed to walk in hospital room?: A Lot Help needed climbing 3-5 steps with a railing? : A Lot 6 Click Score: 16    End of Session Equipment Utilized During Treatment: Oxygen Activity Tolerance: Treatment limited secondary to medical complications (Comment) (drop in BP) Patient left: in bed;with nursing/sitter in room;with call bell/phone within reach;with family/visitor present Nurse Communication: Mobility status PT Visit Diagnosis: Unsteadiness on feet (R26.81);Muscle weakness (generalized) (M62.81);Other abnormalities of gait and mobility (R26.89)    Time: 1345-1405 PT Time Calculation (min) (ACUTE ONLY): 20 min   Charges:   PT Evaluation $PT Eval Low Complexity: 1 Low          Cindy Brandt, DPT Acute Rehabilitation Services [phone]  Cindy Brandt 12/25/2019, 2:26 PM

## 2019-12-25 NOTE — Progress Notes (Signed)
[facility] PROGRESS NOTE    Progress Note  Cindy Brandt  ZHY:[id number] DOB: 01-Jun-1931 DOA: 12/23/2019 PCP: [name], NP     Brief Narrative:   Cindy Brandt is an 86 y.o. female past medical history of coronary artery disease in 2018 with multivessel involvement with the last cath in 2952, chronic diastolic heart failure, diet-controlled diabetes mellitus comes into the hospital for cough and chest discomfort  Assessment/Plan:   Acute respiratory failure with hypoxia ([place]) multifocal bilateral pneumonia/sepsis: She has remained afebrile on IV Rocephin and azithromycin still requiring 3 L of oxygen to keep saturations greater than 95%. Culture data has remained negative till date. IV diuretics were discontinued she appears euvolemic on physical exam her dyspnea is likely due to multifocal bilateral pneumonia. Consult physical therapy ambulating check saturations with ambulation.  Pleuritic chest pain: Her chest pain is pleuritic likely due to pneumonia.  Chronic diastolic heart failure: She appears dry on physical exam she was fluid resuscitated on admission. We will restart her home medications, except for her diuretic therapy.  Mixed lipidemia: Continue statins.  GERD: Without esophagitis continue home PPI.   DVT prophylaxis: lovenox Family Communication:none Status is: Inpatient  Remains inpatient appropriate because:Hemodynamically unstable   Dispo: The patient is from: Home              Anticipated d/c is to: Home              Anticipated d/c date is: 2 days              Patient currently is not medically stable to d/c.        Code Status:     Code Status Orders  (From admission, onward)         Start     Ordered   12/23/19 2245  Full code  Continuous        12/23/19 2244        Code Status History    Date Active Date Inactive Code Status Order ID Comments User Context   08/02/2017 1432 08/02/2017 2118 Full Code [id number]  Lorretta Harp, MD Inpatient   06/14/2016 0910 06/20/2016 1349 Full Code [id number]  Rogelia Mire, NP Inpatient   10/29/2013 0118 10/30/2013 1550 Full Code [id number]  Oswald Hillock, MD Inpatient   11/23/2011 1353 11/25/2011 2151 Full Code [id number]  Delfina Redwood, MD Inpatient   Advance Care Planning Activity        IV Access:    Peripheral IV   Procedures and diagnostic studies:   CT Angio Chest PE W and/or Wo Contrast  Result Date: 12/23/2019 CLINICAL DATA:  Positive D-dimer EXAM: CT ANGIOGRAPHY CHEST WITH CONTRAST TECHNIQUE: Multidetector CT imaging of the chest was performed using the standard protocol during bolus administration of intravenous contrast. Multiplanar CT image reconstructions and MIPs were obtained to evaluate the vascular anatomy. CONTRAST:  75mL OMNIPAQUE IOHEXOL 350 MG/ML SOLN COMPARISON:  Chest x-ray from earlier in the same day. FINDINGS: Cardiovascular: Atherosclerotic calcifications of the thoracic aorta are noted without aneurysmal dilatation. The degree of opacification is limited. The pulmonary artery shows a normal branching pattern bilaterally. No filling defects to suggest pulmonary emboli are identified. Coronary calcifications are seen. No cardiac enlargement is noted. Mediastinum/Nodes: Thoracic inlet is within normal limits. The esophagus as visualized is within normal limits. No mediastinal adenopathy is noted. Small hilar nodes are noted particularly on the right likely reactive in nature Lungs/Pleura: Mild bibasilar atelectatic changes are  noted. No sizable effusion is seen. No sizable pulmonary nodules are noted. Upper Abdomen: No acute abnormality. Musculoskeletal: Degenerative changes of the thoracic spine are seen. No acute rib abnormality is noted. Review of the MIP images confirms the above findings. IMPRESSION: No evidence of pulmonary emboli. Mild bibasilar atelectasis. Aortic Atherosclerosis (ICD10-I70.0). Electronically Signed   By: Inez Catalina  M.D.   On: 12/23/2019 19:55   DG Chest Portable 1 View  Result Date: 12/23/2019 CLINICAL DATA:  Hypoxia, short of breath EXAM: PORTABLE CHEST 1 VIEW COMPARISON:  10/27/2019 FINDINGS: Stable enlarged cardiac silhouette. Ectatic aorta. No effusion, infiltrate or pneumothorax. Chronic bronchitic markings. Hyperinflated lungs. IMPRESSION: 1. No acute findings. 2. Chronic bronchitic markings. 3. Ectatic calcified aorta. 4. Hyperinflated lungs. Electronically Signed   By: Suzy Bouchard M.D.   On: 12/23/2019 18:33     Medical Consultants:    None.  Anti-Infectives:   IV Rocephin and azithromycin  Subjective:    [name] she relates her pain is significantly better today, her shortness of breath is about the same.  Relates she has an appetite this morning.  Objective:    Vitals:   12/25/19 0300 12/25/19 0500 12/25/19 0600 12/25/19 0800  BP: 133/69 128/68 122/67 119/62  Pulse:   67 70  Resp: 20 16 19 18   Temp:  98.4 F (36.9 C)    TempSrc:  Oral    SpO2:  92% 96% 95%  Weight:  63 kg    Height:       SpO2: 95 % O2 Flow Rate (L/min): 3 L/min   Intake/Output Summary (Last 24 hours) at 12/25/2019 0846 Last data filed at 12/25/2019 0600 Gross per 24 hour  Intake 1883.83 ml  Output 800 ml  Net 1083.83 ml   Filed Weights   12/24/19 0130 12/25/19 0500  Weight: 63.2 kg 63 kg    Exam: General exam: In no acute distress. Respiratory system: Good air movement and clear to auscultation. Cardiovascular system: S1 & S2 heard, RRR. No JVD. Gastrointestinal system: Abdomen is nondistended, soft and nontender.  Extremities: No pedal edema. Skin: No rashes, lesions or ulcers Psychiatry: Judgement and insight appear normal. Mood & affect appropriate.   Data Reviewed:    Labs: Basic Metabolic Panel: Recent Labs  Lab 12/23/19 1804 12/23/19 1804 12/23/19 2257 12/24/19 0441  NA 135  --  136 134*  K 4.6   < > 4.3 3.5  CL 99  --   --  100  CO2 24  --   --  24    GLUCOSE 161*  --   --  135*  BUN 17  --   --  20  CREATININE 0.93  --   --  0.89  CALCIUM 9.5  --   --  8.8*  MG  --   --   --  1.8   < > = values in this interval not displayed.   GFR Estimated Creatinine Clearance: 36.7 mL/min (by C-G formula based on SCr of 0.89 mg/dL). Liver Function Tests: Recent Labs  Lab 12/23/19 1804 12/24/19 0441  AST 22 19  ALT 15 14  ALKPHOS 57 54  BILITOT 1.1 0.7  PROT 7.6 7.0  ALBUMIN 3.4* 2.8*   Recent Labs  Lab 12/23/19 1804  LIPASE 17   No results for input(s): AMMONIA in the last 168 hours. Coagulation profile Recent Labs  Lab 12/23/19 1804  INR 0.9   COVID-19 Labs  Recent Labs    12/23/19 1804 12/23/19 2235  DDIMER 1.50*  --   CRP  --  15.3*    Lab Results  Component Value Date   [place] NEGATIVE 12/23/2019    CBC: Recent Labs  Lab 12/23/19 1844 12/23/19 2257 12/24/19 0441  WBC 10.7*  --  10.2  NEUTROABS 9.7*  --  8.1*  HGB 11.9* 11.2* 11.1*  HCT 36.6 33.0* 34.4*  MCV 104.9*  --  106.2*  PLT 223  --  227   Cardiac Enzymes: No results for input(s): CKTOTAL, CKMB, CKMBINDEX, TROPONINI in the last 168 hours. BNP (last 3 results) Recent Labs    10/27/19 1123  PROBNP 97.0   CBG: No results for input(s): GLUCAP in the last 168 hours. D-Dimer: Recent Labs    12/23/19 1804  DDIMER 1.50*   Hgb A1c: No results for input(s): HGBA1C in the last 72 hours. Lipid Profile: No results for input(s): CHOL, HDL, LDLCALC, TRIG, CHOLHDL, LDLDIRECT in the last 72 hours. Thyroid function studies: No results for input(s): TSH, T4TOTAL, T3FREE, THYROIDAB in the last 72 hours.  Invalid input(s): FREET3 Anemia work up: No results for input(s): VITAMINB12, FOLATE, FERRITIN, TIBC, IRON, RETICCTPCT in the last 72 hours. Sepsis Labs: Recent Labs  Lab 12/23/19 1804 12/23/19 1844 12/23/19 1943 12/23/19 2235 12/24/19 0441  PROCALCITON  --   --   --  0.52  --   WBC  --  10.7*  --   --  10.2  LATICACIDVEN 1.6  --   0.8  --   --    Microbiology Recent Results (from the past 240 hour(s))  Respiratory Panel by RT PCR (Flu A&B, Covid) - Nasopharyngeal Swab     Status: None   Collection Time: 12/23/19  5:29 PM   Specimen: Nasopharyngeal Swab  Result Value Ref Range Status   SARS Coronavirus 2 by RT PCR NEGATIVE NEGATIVE Final    Comment: (NOTE) SARS-CoV-2 target nucleic acids are NOT DETECTED.  The SARS-CoV-2 RNA is generally detectable in upper respiratoy specimens during the acute phase of infection. The lowest concentration of SARS-CoV-2 viral copies this assay can detect is 131 copies/mL. A negative result does not preclude SARS-Cov-2 infection and should not be used as the sole basis for treatment or other patient management decisions. A negative result may occur with  improper specimen collection/handling, submission of specimen other than nasopharyngeal swab, presence of viral mutation(s) within the areas targeted by this assay, and inadequate number of viral copies (<131 copies/mL). A negative result must be combined with clinical observations, patient history, and epidemiological information. The expected result is Negative.  Fact Sheet for Patients:  PinkCheek.be  Fact Sheet for Healthcare Providers:  GravelBags.it  This test is no t yet approved or cleared by the [place] FDA and  has been authorized for detection and/or diagnosis of SARS-CoV-2 by FDA under an Emergency Use Authorization (EUA). This EUA will remain  in effect (meaning this test can be used) for the duration of the COVID-19 declaration under Section 564(b)(1) of the Act, 21 U.S.C. section 360bbb-3(b)(1), unless the authorization is terminated or revoked sooner.     Influenza A by PCR NEGATIVE NEGATIVE Final   Influenza B by PCR NEGATIVE NEGATIVE Final    Comment: (NOTE) The Xpert Xpress SARS-CoV-2/FLU/RSV assay is intended as an aid in  the diagnosis  of influenza from Nasopharyngeal swab specimens and  should not be used as a sole basis for treatment. Nasal washings and  aspirates are unacceptable for Xpert Xpress SARS-CoV-2/FLU/RSV  testing.  Fact Sheet  for Patients: PinkCheek.be  Fact Sheet for Healthcare Providers: GravelBags.it  This test is not yet approved or cleared by the [place] FDA and  has been authorized for detection and/or diagnosis of SARS-CoV-2 by  FDA under an Emergency Use Authorization (EUA). This EUA will remain  in effect (meaning this test can be used) for the duration of the  Covid-19 declaration under Section 564(b)(1) of the Act, 21  U.S.C. section 360bbb-3(b)(1), unless the authorization is  terminated or revoked. Performed at [facility], [place] [address], [place], [place] [address]   Blood culture (routine x 2)     Status: None (Preliminary result)   Collection Time: 12/23/19  6:04 PM   Specimen: BLOOD  Result Value Ref Range Status   Specimen Description BLOOD LEFT ANTECUBITAL  Final   Special Requests   Final    BOTTLES DRAWN AEROBIC AND ANAEROBIC Blood Culture results may not be optimal due to an inadequate volume of blood received in culture bottles   Culture  Setup Time   Final    GRAM POSITIVE COCCI IN CLUSTERS AEROBIC BOTTLE ONLY Organism ID to follow CRITICAL RESULT CALLED TO, READ BACK BY AND VERIFIED WITH: K. [place], AT [place] 12/24/19 BY D. VANHOOK Performed at [facility], [place] [address], [place], [place] [address]    Culture GRAM POSITIVE COCCI  Final   Report Status PENDING  Incomplete  Blood Culture ID Panel (Reflexed)     Status: Abnormal   Collection Time: 12/23/19  6:04 PM  Result Value Ref Range Status   Enterococcus faecalis NOT DETECTED NOT DETECTED Final   Enterococcus Faecium NOT DETECTED NOT DETECTED Final   Listeria monocytogenes NOT DETECTED NOT DETECTED Final   Staphylococcus species  DETECTED (A) NOT DETECTED Final    Comment: CRITICAL RESULT CALLED TO, READ BACK BY AND VERIFIED WITH: K. HURTH PHARMD, AT 1617 12/24/19 BY D. VANHOOK    Staphylococcus aureus (BCID) NOT DETECTED NOT DETECTED Final   Staphylococcus epidermidis NOT DETECTED NOT DETECTED Final   Staphylococcus lugdunensis NOT DETECTED NOT DETECTED Final   Streptococcus species NOT DETECTED NOT DETECTED Final   Streptococcus agalactiae NOT DETECTED NOT DETECTED Final   Streptococcus pneumoniae NOT DETECTED NOT DETECTED Final   Streptococcus pyogenes NOT DETECTED NOT DETECTED Final   A.calcoaceticus-baumannii NOT DETECTED NOT DETECTED Final   Bacteroides fragilis NOT DETECTED NOT DETECTED Final   Enterobacterales NOT DETECTED NOT DETECTED Final   Enterobacter cloacae complex NOT DETECTED NOT DETECTED Final   Escherichia coli NOT DETECTED NOT DETECTED Final   Klebsiella aerogenes NOT DETECTED NOT DETECTED Final   Klebsiella oxytoca NOT DETECTED NOT DETECTED Final   Klebsiella pneumoniae NOT DETECTED NOT DETECTED Final   Proteus species NOT DETECTED NOT DETECTED Final   Salmonella species NOT DETECTED NOT DETECTED Final   Serratia marcescens NOT DETECTED NOT DETECTED Final   Haemophilus influenzae NOT DETECTED NOT DETECTED Final   Neisseria meningitidis NOT DETECTED NOT DETECTED Final   Pseudomonas aeruginosa NOT DETECTED NOT DETECTED Final   Stenotrophomonas maltophilia NOT DETECTED NOT DETECTED Final   Candida albicans NOT DETECTED NOT DETECTED Final   Candida auris NOT DETECTED NOT DETECTED Final   Candida glabrata NOT DETECTED NOT DETECTED Final   Candida krusei NOT DETECTED NOT DETECTED Final   Candida parapsilosis NOT DETECTED NOT DETECTED Final   Candida tropicalis NOT DETECTED NOT DETECTED Final   Cryptococcus neoformans/gattii NOT DETECTED NOT DETECTED Final    Comment: Performed at [facility], [place] [address], [place], [place] [address]  Blood culture (routine x 2)     Status: None  (Preliminary result)   Collection Time: 12/23/19  6:08 PM   Specimen: BLOOD  Result Value Ref Range Status   Specimen Description BLOOD SITE NOT SPECIFIED  Final   Special Requests   Final    BOTTLES DRAWN AEROBIC AND ANAEROBIC Blood Culture adequate volume   Culture   Final    NO GROWTH < 24 HOURS Performed at [facility], [place] [address], [place], [place] [address]    Report Status PENDING  Incomplete  Urine culture     Status: None   Collection Time: 12/23/19 10:27 PM   Specimen: Urine, Random  Result Value Ref Range Status   Specimen Description URINE, RANDOM  Final   Special Requests NONE  Final   Culture   Final    NO GROWTH Performed at [facility], [place] [address], [place], [place] [address]    Report Status 12/25/2019 FINAL  Final     Medications:   . amLODipine  2.5 mg Oral Daily  . aspirin EC  81 mg Oral q morning - 10a  . atenolol  25 mg Oral Daily  . atorvastatin  80 mg Oral QPM  . busPIRone  7.5 mg Oral BID  . clopidogrel  75 mg Oral QHS  . enoxaparin (LOVENOX) injection  40 mg Subcutaneous Daily  . famotidine  20 mg Oral Daily  . gabapentin  100 mg Oral BID  . isosorbide mononitrate  60 mg Oral q AM  . loratadine  10 mg Oral Daily  . pantoprazole  40 mg Oral QAC breakfast  . ranolazine  500 mg Oral BID  . sertraline  100 mg Oral QHS  . umeclidinium bromide  1 puff Inhalation Daily   Continuous Infusions: . azithromycin 500 mg (12/24/19 2214)  . cefTRIAXone (ROCEPHIN)  IV 2 g (12/24/19 2319)      LOS: 2 days   Charlynne Cousins  [facility]  12/25/2019, 8:46 AM

## 2019-12-26 ENCOUNTER — Other Ambulatory Visit: Payer: Self-pay | Admitting: Primary Care

## 2019-12-26 DIAGNOSIS — F411 Generalized anxiety disorder: Secondary | ICD-10-CM

## 2019-12-26 LAB — LACTIC ACID, PLASMA
Lactic Acid, Venous: 0.6 mmol/L (ref 0.5–1.9)
Lactic Acid, Venous: 0.9 mmol/L (ref 0.5–1.9)

## 2019-12-26 LAB — CULTURE, BLOOD (ROUTINE X 2)

## 2019-12-26 LAB — BRAIN NATRIURETIC PEPTIDE: B Natriuretic Peptide: 96.1 pg/mL (ref 0.0–100.0)

## 2019-12-26 LAB — TROPONIN I (HIGH SENSITIVITY): Troponin I (High Sensitivity): 9 ng/L (ref <18)

## 2019-12-26 MED ORDER — METHYLPREDNISOLONE SODIUM SUCC 40 MG IJ SOLR
40.0000 mg | Freq: Two times a day (BID) | INTRAMUSCULAR | Status: DC
  Administered 2019-12-26 – 2019-12-28 (×5): 40 mg via INTRAVENOUS
  Filled 2019-12-26 (×5): qty 1

## 2019-12-26 MED ORDER — IPRATROPIUM-ALBUTEROL 0.5-2.5 (3) MG/3ML IN SOLN: 3.0000 mL | RESPIRATORY_TRACT | Status: DC | PRN

## 2019-12-26 MED ORDER — ALBUTEROL SULFATE (2.5 MG/3ML) 0.083% IN NEBU
2.5000 mg | INHALATION_SOLUTION | RESPIRATORY_TRACT | Status: DC | PRN
  Administered 2019-12-28: 2.5 mg via RESPIRATORY_TRACT
  Filled 2019-12-26: qty 3

## 2019-12-26 MED ORDER — BUDESONIDE 0.25 MG/2ML IN SUSP
0.2500 mg | Freq: Two times a day (BID) | RESPIRATORY_TRACT | Status: DC
  Administered 2019-12-26 – 2019-12-29 (×7): 0.25 mg via RESPIRATORY_TRACT
  Filled 2019-12-26 (×8): qty 2

## 2019-12-26 MED ORDER — AZITHROMYCIN 500 MG PO TABS
500.0000 mg | ORAL_TABLET | Freq: Once | ORAL | Status: AC
  Administered 2019-12-26: 500 mg via ORAL
  Filled 2019-12-26: qty 1

## 2019-12-26 MED ORDER — ALBUTEROL SULFATE (2.5 MG/3ML) 0.083% IN NEBU
2.5000 mg | INHALATION_SOLUTION | Freq: Three times a day (TID) | RESPIRATORY_TRACT | Status: DC
  Administered 2019-12-26 – 2019-12-29 (×8): 2.5 mg via RESPIRATORY_TRACT
  Filled 2019-12-26 (×9): qty 3

## 2019-12-26 MED ORDER — ALBUTEROL SULFATE (2.5 MG/3ML) 0.083% IN NEBU: 2.5000 mg | INHALATION_SOLUTION | RESPIRATORY_TRACT | Status: DC

## 2019-12-26 MED ORDER — UMECLIDINIUM BROMIDE 62.5 MCG/INH IN AEPB
1.0000 | INHALATION_SPRAY | Freq: Every day | RESPIRATORY_TRACT | Status: DC
  Administered 2019-12-28 – 2019-12-29 (×2): 1 via RESPIRATORY_TRACT
  Filled 2019-12-26: qty 7

## 2019-12-26 MED ORDER — TIOTROPIUM BROMIDE MONOHYDRATE 18 MCG IN CAPS: 18.0000 ug | ORAL_CAPSULE | Freq: Every day | RESPIRATORY_TRACT | Status: DC

## 2019-12-26 MED ORDER — IPRATROPIUM-ALBUTEROL 0.5-2.5 (3) MG/3ML IN SOLN
3.0000 mL | RESPIRATORY_TRACT | Status: DC
  Administered 2019-12-26 (×2): 3 mL via RESPIRATORY_TRACT
  Filled 2019-12-26 (×2): qty 3

## 2019-12-26 NOTE — Progress Notes (Signed)
Physical Therapy Treatment Patient Details Name: Cindy Brandt MRN: [id number] DOB: Mar 30, 1931 Today's Date: 12/26/2019    History of Present Illness pt is an 86 y.o. female past medical history of coronary artery disease in 2018 with multivessel involvement with the last cath in 2376, chronic diastolic heart failure, diet-controlled diabetes mellitus comes into the hospital for cough and chest discomfort    PT Comments    Pt with improved tolerance to get OOB, pt not symptomatic with slight drop in BP; pt requiring cueing to improve breathing; pt unsteady requiring B UE support with all standing activities; pt continues to demonstrate deficits in balance, strength, coordination, gait, safety and endurance and will benefit from skilled PT to address deficits to maximize independence with functional mobility prior to discharge.     Follow Up Recommendations  Home health PT;Supervision/Assistance - 24 hour     Equipment Recommendations       Recommendations for Other Services       Precautions / Restrictions      Mobility  Bed Mobility Overal bed mobility: Needs Assistance Bed Mobility: Supine to Sit     Supine to sit: Min assist;HOB elevated        Transfers Overall transfer level: Needs assistance Equipment used: 1 person hand held assist Transfers: Sit to/from Stand Sit to Stand: Min assist         General transfer comment: min A needed for stand step from EOB to recliner  Ambulation/Gait                 Stairs             Wheelchair Mobility    Modified Rankin (Stroke Patients Only)       Balance Overall balance assessment: Needs assistance Sitting-balance support: Feet supported Sitting balance-Leahy Scale: Fair Sitting balance - Comments: pt able to maintain balance wtihout UE support or support from therapist; pt maintained sitting for increased time while therapist assisting with changing clothes due to incontinence   Standing  balance support: Bilateral upper extremity supported Standing balance-Leahy Scale: Poor Standing balance comment: required B UE support during static and dynamic standing                            Cognition                                              Exercises      General Comments        Pertinent Vitals/Pain      Home Living                      Prior Function            PT Goals (current goals can now be found in the care plan section) Acute Rehab PT Goals Patient Stated Goal: To go home PT Goal Formulation: With patient/family Time For Goal Achievement: 01/08/20 Potential to Achieve Goals: Good Progress towards PT goals: Progressing toward goals    Frequency    Min 3X/week      PT Plan Current plan remains appropriate    Co-evaluation              AM-PAC PT "6 Clicks" Mobility   Outcome Measure  Help needed turning from your back to your  side while in a flat bed without using bedrails?: None Help needed moving from lying on your back to sitting on the side of a flat bed without using bedrails?: A Little Help needed moving to and from a bed to a chair (including a wheelchair)?: A Little Help needed standing up from a chair using your arms (e.g., wheelchair or bedside chair)?: A Little Help needed to walk in hospital room?: A Little Help needed climbing 3-5 steps with a railing? : A Lot 6 Click Score: 18    End of Session Equipment Utilized During Treatment: Gait belt Activity Tolerance: Patient tolerated treatment well Patient left: in chair;with nursing/sitter in room;with call bell/phone within reach Nurse Communication: Mobility status PT Visit Diagnosis: Unsteadiness on feet (R26.81);Muscle weakness (generalized) (M62.81);Other abnormalities of gait and mobility (R26.89)     Time: 1047-1110 PT Time Calculation (min) (ACUTE ONLY): 23 min  Charges:  $Therapeutic Activity: 23-37 mins                      Lyanne Co, DPT Acute Rehabilitation Services [phone]   [place] 12/26/2019, 12:15 PM

## 2019-12-26 NOTE — Progress Notes (Addendum)
[facility] PROGRESS NOTE    Progress Note  Cindy Brandt  RXV:[id number] DOB: 1931/06/28 DOA: 12/23/2019 PCP: [name], NP     Brief Narrative:   Cindy Brandt is an 86 y.o. female past medical history of coronary artery disease in 2018 with multivessel involvement with the last cath in 5093, chronic diastolic heart failure, diet-controlled diabetes mellitus comes into the hospital for cough and chest discomfort  Assessment/Plan:   Acute respiratory failure with hypoxia ([place]) multifocal bilateral pneumonia/sepsis: She has remained afebrile on IV Rocephin and azithromycin. Overnight her saturations worsen she is requiring 7 L of high flow nasal cannula. On physical exam she has poor air movement with wheezing bilaterally. We will start her on IV steroids, spiriva, schedule albuterol and as needed. The Blood culturee ID reflex panel was positive for staph, 2 out of 2 blood cultures are negative question a contaminant.  Pleuritic chest pain: Her chest pain is pleuritic likely due to pneumonia.  Chronic diastolic heart failure: KVO IV fluids continue, atenolol, Imdur and diuretic therapy.  Mixed lipidemia: Continue statins.  GERD: Without esophagitis continue home PPI.   DVT prophylaxis: lovenox Family Communication:none Status is: Inpatient  Remains inpatient appropriate because:Hemodynamically unstable   Dispo: The patient is from: Home              Anticipated d/c is to: Home              Anticipated d/c date is: 2 days              Patient currently is not medically stable to d/c.  When she is off oxygen.        Code Status:     Code Status Orders  (From admission, onward)         Start     Ordered   12/23/19 2245  Full code  Continuous        12/23/19 2244        Code Status History    Date Active Date Inactive Code Status Order ID Comments User Context   08/02/2017 1432 08/02/2017 2118 Full Code [id number]  Lorretta Harp, MD  Inpatient   06/14/2016 0910 06/20/2016 1349 Full Code [id number]  Rogelia Mire, NP Inpatient   10/29/2013 0118 10/30/2013 1550 Full Code [id number]  Oswald Hillock, MD Inpatient   11/23/2011 1353 11/25/2011 2151 Full Code [id number]  Delfina Redwood, MD Inpatient   Advance Care Planning Activity        IV Access:    Peripheral IV   Procedures and diagnostic studies:   DG CHEST PORT 1 VIEW  Result Date: 12/25/2019 CLINICAL DATA:  Chest pain and shortness of breath. EXAM: PORTABLE CHEST 1 VIEW COMPARISON:  Radiograph and CT 12/23/2019 FINDINGS: Stable mild cardiomegaly. Unchanged mediastinal contours. Aortic atherosclerosis. Chronic bronchial thickening with mild emphysema on CT. Slight increase in streaky opacities in the right perihilar lung. Left lower lobe airspace disease is similar to CT. No pulmonary edema or pneumothorax. No large pleural effusion. Stable osseous structures. IMPRESSION: 1. Slight increase in streaky right perihilar opacities, favoring atelectasis. 2. Stable left lower lobe airspace disease/atelectasis. 3. Stable mild cardiomegaly. Electronically Signed   By: Keith Rake M.D.   On: 12/25/2019 23:29     Medical Consultants:    None.  Anti-Infectives:   IV Rocephin and azithromycin  Subjective:    Cindy Brandt she relates he had a bat night as she was more short of breath  cannot catch her sleep.  Objective:    Vitals:   12/25/19 2320 12/26/19 0300 12/26/19 0354 12/26/19 0417  BP: 130/76 113/68 113/68 110/65  Pulse: 90 92 95 96  Resp: 20 17 (!) 25 19  Temp:  98.8 F (37.1 C)  98.1 F (36.7 C)  TempSrc:  Oral  Axillary  SpO2: 92% 98% 93% 96%  Weight:    63 kg  Height:       SpO2: 96 % O2 Flow Rate (L/min): 5 L/min   Intake/Output Summary (Last 24 hours) at 12/26/2019 0740 Last data filed at 12/26/2019 0100 Gross per 24 hour  Intake 120 ml  Output 600 ml  Net -480 ml   Filed Weights   12/24/19 0130 12/25/19 0500 12/26/19 0417    Weight: 63.2 kg 63 kg 63 kg    Exam: General exam: In no acute distress. Respiratory system: Moderate air movement with wheezing bilaterally. Cardiovascular system: S1 & S2 heard, RRR. No JVD. Gastrointestinal system: Abdomen is nondistended, soft and nontender.  Extremities: No pedal edema. Skin: No rashes, lesions or ulcers Psychiatry: Judgment and insight appear normal mood and affect are appropriate mentally she is sharp..   Data Reviewed:    Labs: Basic Metabolic Panel: Recent Labs  Lab 12/23/19 1804 12/23/19 1804 12/23/19 2257 12/23/19 2257 12/24/19 0441 12/25/19 1121  NA 135  --  136  --  134* 136  K 4.6   < > 4.3   < > 3.5 4.1  CL 99  --   --   --  100 101  CO2 24  --   --   --  24 26  GLUCOSE 161*  --   --   --  135* 205*  BUN 17  --   --   --  20 24*  CREATININE 0.93  --   --   --  0.89 1.02*  CALCIUM 9.5  --   --   --  8.8* 8.5*  MG  --   --   --   --  1.8  --    < > = values in this interval not displayed.   GFR Estimated Creatinine Clearance: 31.9 mL/min (A) (by C-G formula based on SCr of 1.02 mg/dL (H)). Liver Function Tests: Recent Labs  Lab 12/23/19 1804 12/24/19 0441  AST 22 19  ALT 15 14  ALKPHOS 57 54  BILITOT 1.1 0.7  PROT 7.6 7.0  ALBUMIN 3.4* 2.8*   Recent Labs  Lab 12/23/19 1804  LIPASE 17   No results for input(s): AMMONIA in the last 168 hours. Coagulation profile Recent Labs  Lab 12/23/19 1804  INR 0.9   COVID-19 Labs  Recent Labs    12/23/19 1804 12/23/19 2235  DDIMER 1.50*  --   CRP  --  15.3*    Lab Results  Component Value Date   SARSCOV2NAA NEGATIVE 12/23/2019    CBC: Recent Labs  Lab 12/23/19 1844 12/23/19 2257 12/24/19 0441 12/25/19 1121  WBC 10.7*  --  10.2 5.7  NEUTROABS 9.7*  --  8.1* 4.2  HGB 11.9* 11.2* 11.1* 10.7*  HCT 36.6 33.0* 34.4* 32.9*  MCV 104.9*  --  106.2* 107.2*  PLT 223  --  227 263   Cardiac Enzymes: No results for input(s): CKTOTAL, CKMB, CKMBINDEX, TROPONINI in the last  168 hours. BNP (last 3 results) Recent Labs    10/27/19 1123  PROBNP 97.0   CBG: No results for input(s): GLUCAP in the last 168  hours. D-Dimer: Recent Labs    12/23/19 1804  DDIMER 1.50*   Hgb A1c: Recent Labs    12/23/19 2235  HGBA1C 6.6*   Lipid Profile: No results for input(s): CHOL, HDL, LDLCALC, TRIG, CHOLHDL, LDLDIRECT in the last 72 hours. Thyroid function studies: No results for input(s): TSH, T4TOTAL, T3FREE, THYROIDAB in the last 72 hours.  Invalid input(s): FREET3 Anemia work up: No results for input(s): VITAMINB12, FOLATE, FERRITIN, TIBC, IRON, RETICCTPCT in the last 72 hours. Sepsis Labs: Recent Labs  Lab 12/23/19 1804 12/23/19 1844 12/23/19 1943 12/23/19 2235 12/24/19 0441 12/25/19 1121 12/25/19 2352 12/26/19 0253  PROCALCITON  --   --   --  0.52  --   --   --   --   WBC  --  10.7*  --   --  10.2 5.7  --   --   LATICACIDVEN 1.6  --  0.8  --   --   --  0.6 0.9   Microbiology Recent Results (from the past 240 hour(s))  Respiratory Panel by RT PCR (Flu A&B, Covid) - Nasopharyngeal Swab     Status: None   Collection Time: 12/23/19  5:29 PM   Specimen: Nasopharyngeal Swab  Result Value Ref Range Status   SARS Coronavirus 2 by RT PCR NEGATIVE NEGATIVE Final    Comment: (NOTE) SARS-CoV-2 target nucleic acids are NOT DETECTED.  The SARS-CoV-2 RNA is generally detectable in upper respiratoy specimens during the acute phase of infection. The lowest concentration of SARS-CoV-2 viral copies this assay can detect is 131 copies/mL. A negative result does not preclude SARS-Cov-2 infection and should not be used as the sole basis for treatment or other patient management decisions. A negative result may occur with  improper specimen collection/handling, submission of specimen other than nasopharyngeal swab, presence of viral mutation(s) within the areas targeted by this assay, and inadequate number of viral copies (<131 copies/mL). A negative result must  be combined with clinical observations, patient history, and epidemiological information. The expected result is Negative.  Fact Sheet for Patients:  PinkCheek.be  Fact Sheet for Healthcare Providers:  GravelBags.it  This test is no t yet approved or cleared by the [place] FDA and  has been authorized for detection and/or diagnosis of SARS-CoV-2 by FDA under an Emergency Use Authorization (EUA). This EUA will remain  in effect (meaning this test can be used) for the duration of the COVID-19 declaration under Section 564(b)(1) of the Act, 21 U.S.C. section 360bbb-3(b)(1), unless the authorization is terminated or revoked sooner.     Influenza A by PCR NEGATIVE NEGATIVE Final   Influenza B by PCR NEGATIVE NEGATIVE Final    Comment: (NOTE) The Xpert Xpress SARS-CoV-2/FLU/RSV assay is intended as an aid in  the diagnosis of influenza from Nasopharyngeal swab specimens and  should not be used as a sole basis for treatment. Nasal washings and  aspirates are unacceptable for Xpert Xpress SARS-CoV-2/FLU/RSV  testing.  Fact Sheet for Patients: PinkCheek.be  Fact Sheet for Healthcare Providers: GravelBags.it  This test is not yet approved or cleared by the [place] FDA and  has been authorized for detection and/or diagnosis of SARS-CoV-2 by  FDA under an Emergency Use Authorization (EUA). This EUA will remain  in effect (meaning this test can be used) for the duration of the  Covid-19 declaration under Section 564(b)(1) of the Act, 21  U.S.C. section 360bbb-3(b)(1), unless the authorization is  terminated or revoked. Performed at [facility], [place]  [address], [place], [place] [address]   Blood culture (routine x 2)     Status: None (Preliminary result)   Collection Time: 12/23/19  6:04 PM   Specimen: BLOOD  Result Value Ref Range Status   Specimen  Description BLOOD LEFT ANTECUBITAL  Final   Special Requests   Final    BOTTLES DRAWN AEROBIC AND ANAEROBIC Blood Culture results may not be optimal due to an inadequate volume of blood received in culture bottles   Culture  Setup Time   Final    GRAM POSITIVE COCCI IN CLUSTERS AEROBIC BOTTLE ONLY Organism ID to follow CRITICAL RESULT CALLED TO, READ BACK BY AND VERIFIED WITH: K. [place], AT [place] 12/24/19 BY D. VANHOOK Performed at [facility], [place] [address], [place], [place] [address]    Culture GRAM POSITIVE COCCI  Final   Report Status PENDING  Incomplete  Blood Culture ID Panel (Reflexed)     Status: Abnormal   Collection Time: 12/23/19  6:04 PM  Result Value Ref Range Status   Enterococcus faecalis NOT DETECTED NOT DETECTED Final   Enterococcus Faecium NOT DETECTED NOT DETECTED Final   Listeria monocytogenes NOT DETECTED NOT DETECTED Final   Staphylococcus species DETECTED (A) NOT DETECTED Final    Comment: CRITICAL RESULT CALLED TO, READ BACK BY AND VERIFIED WITH: K. HURTH PHARMD, AT 1617 12/24/19 BY D. VANHOOK    Staphylococcus aureus (BCID) NOT DETECTED NOT DETECTED Final   Staphylococcus epidermidis NOT DETECTED NOT DETECTED Final   Staphylococcus lugdunensis NOT DETECTED NOT DETECTED Final   Streptococcus species NOT DETECTED NOT DETECTED Final   Streptococcus agalactiae NOT DETECTED NOT DETECTED Final   Streptococcus pneumoniae NOT DETECTED NOT DETECTED Final   Streptococcus pyogenes NOT DETECTED NOT DETECTED Final   A.calcoaceticus-baumannii NOT DETECTED NOT DETECTED Final   Bacteroides fragilis NOT DETECTED NOT DETECTED Final   Enterobacterales NOT DETECTED NOT DETECTED Final   Enterobacter cloacae complex NOT DETECTED NOT DETECTED Final   Escherichia coli NOT DETECTED NOT DETECTED Final   Klebsiella aerogenes NOT DETECTED NOT DETECTED Final   Klebsiella oxytoca NOT DETECTED NOT DETECTED Final   Klebsiella pneumoniae NOT DETECTED NOT DETECTED Final    Proteus species NOT DETECTED NOT DETECTED Final   Salmonella species NOT DETECTED NOT DETECTED Final   Serratia marcescens NOT DETECTED NOT DETECTED Final   Haemophilus influenzae NOT DETECTED NOT DETECTED Final   Neisseria meningitidis NOT DETECTED NOT DETECTED Final   Pseudomonas aeruginosa NOT DETECTED NOT DETECTED Final   Stenotrophomonas maltophilia NOT DETECTED NOT DETECTED Final   Candida albicans NOT DETECTED NOT DETECTED Final   Candida auris NOT DETECTED NOT DETECTED Final   Candida glabrata NOT DETECTED NOT DETECTED Final   Candida krusei NOT DETECTED NOT DETECTED Final   Candida parapsilosis NOT DETECTED NOT DETECTED Final   Candida tropicalis NOT DETECTED NOT DETECTED Final   Cryptococcus neoformans/gattii NOT DETECTED NOT DETECTED Final    Comment: Performed at [facility], [address], [place], [place] [address]  Blood culture (routine x 2)     Status: None (Preliminary result)   Collection Time: 12/23/19  6:08 PM   Specimen: BLOOD  Result Value Ref Range Status   Specimen Description BLOOD SITE NOT SPECIFIED  Final   Special Requests   Final    BOTTLES DRAWN AEROBIC AND ANAEROBIC Blood Culture adequate volume   Culture   Final    NO GROWTH 3 DAYS Performed at [facility], [address], [place], [place] [address]    Report Status PENDING  Incomplete  Urine culture     Status: None   Collection Time: 12/23/19 10:27 PM   Specimen: Urine, Random  Result Value Ref Range Status   Specimen Description URINE, RANDOM  Final   Special Requests NONE  Final   Culture   Final    NO GROWTH Performed at [facility], [place] [address], [place], [place] [address]    Report Status 12/25/2019 FINAL  Final     Medications:   . amLODipine  2.5 mg Oral Daily  . aspirin EC  81 mg Oral q morning - 10a  . atenolol  25 mg Oral Daily  . atorvastatin  80 mg Oral QPM  . budesonide (PULMICORT) nebulizer solution  0.25 mg Nebulization BID  . busPIRone  7.5 mg  Oral BID  . clopidogrel  75 mg Oral QHS  . enoxaparin (LOVENOX) injection  40 mg Subcutaneous Daily  . famotidine  20 mg Oral Daily  . gabapentin  100 mg Oral BID  . ipratropium-albuterol  3 mL Nebulization Q4H  . isosorbide mononitrate  60 mg Oral q AM  . loratadine  10 mg Oral Daily  . pantoprazole  40 mg Oral QAC breakfast  . ranolazine  500 mg Oral BID  . sertraline  100 mg Oral QHS   Continuous Infusions: . azithromycin 500 mg (12/25/19 2131)  . cefTRIAXone (ROCEPHIN)  IV 2 g (12/25/19 2029)      LOS: 3 days   Charlynne Cousins  [facility]  12/26/2019, 7:40 AM

## 2019-12-26 NOTE — Progress Notes (Signed)
Pt having difficulty breathing upon shift change. Albuterol neb given. Pt continued to feel "uneasy" and had shortness of breath. Pt c/o of chest pain d/t wet coughing, 02 levels in the 80s, wheezing and congestion. Paged Dr. Hal Hope. MD ordered 20 mg of lasix at bedside. Also, ordered bipap if no relief from lasix. MD also ordered another dose of albuterol. Pt states that she is feeling somewhat better. Will continue to monitor.   Fransico Michael, RN

## 2019-12-26 NOTE — Progress Notes (Signed)
PHARMACIST - PHYSICIAN COMMUNICATION DR:   Aileen Fass CONCERNING: Antibiotic IV to Oral Route Change Policy  RECOMMENDATION: This patient is receiving azithromycin by the intravenous route.  Based on criteria approved by the Pharmacy and Therapeutics Committee, the antibiotic(s) is/are being converted to the equivalent oral dose form(s).   DESCRIPTION: These criteria include:  Patient being treated for a respiratory tract infection, urinary tract infection, cellulitis or clostridium difficile associated diarrhea if on metronidazole  The patient is not neutropenic and does not exhibit a GI malabsorption state  The patient is eating (either orally or via tube) and/or has been taking other orally administered medications for a least 24 hours  The patient is improving clinically and has a Tmax < 100.5  If you have questions about this conversion, please contact the Pharmacy Department  []   [phone] )  [facility] []   [phone] )  [facility] [x]   [phone] )  [facility] []   [phone] )  [facility] []   [phone] )  [facility]

## 2019-12-26 NOTE — Significant Event (Signed)
At around 11:20 PM December 25, 2019 the nurse notified that patient was getting short of breath.  On exam at bedside patient is found to be wheezing bilaterally.  Blood pressure is found to be around 130/80.  Heart rate is around 90 bpm.  Afebrile.  Chest x-ray has been ordered I gave one dose of Lasix 20 mg IV placed patient on BiPAP and since patient is wheezing I placed patient on scheduled dose of nebulizer Pulmicort with as needed duo nebs.  We will continue to monitor.  In addition ordered BNP and lactic acid and troponin.  Cindy Brandt

## 2019-12-27 MED ORDER — FLUTICASONE PROPIONATE 50 MCG/ACT NA SUSP
1.0000 | Freq: Every day | NASAL | Status: DC
  Administered 2019-12-27 – 2019-12-29 (×3): 1 via NASAL
  Filled 2019-12-27: qty 16

## 2019-12-27 NOTE — Progress Notes (Addendum)
PROGRESS NOTE    Cindy Brandt  TFT:[id number] DOB: 1931/05/13 DOA: 12/23/2019 PCP: [name], NP   Brief Narrative:  HPI on 12/23/2019 by Dr. Inda Merlin 86 year old female with past medical history of coronary artery disease (NSTEMI in 2018, multivessel disease, last cath 10/2017), hypertension, hyperlipidemia, diastolic congestive heart failure (Echo 05/2016 EF 60-65%), prediabetes and gastroesophageal reflux disease who presents to [facility] emergency department via EMS due to complaints of cough and chest discomfort.  Patient is a rather poor historian and the majority the history has been obtained from the daughter who is at the bedside at time of interview.  The patient has been experiencing symptoms of cough and "congestion" for approximately 8 weeks, since early August.  Patient's cough has been intermittently productive with thick white mucus.  Patient's symptoms have been gradually worsening over the span of time.  Patient presented to a [facility] urgent care clinic in August and at that time was placed on a course of doxycycline and prednisone without clinical improvement.  Patient has been following with her primary care provider since and due to persisting symptoms of cough with progressively worsening shortness of breath a referral to pulmonology was placed during the 10/15 appointment.  Patient's daughter explains that in the past 3 or 4 days the patient has exhibited progressively worsening lethargy and weakness.  This is worsened to the point where the patient hardly ever is out of bed due to exhaustion.  Patient shortness of breath is continue to worsen as well and is severe in intensity with minimal exertion.  Patient denies paroxysmal nocturnal dyspnea or pillow orthopnea.  Patient denies lower extremity edema.  Upon further questioning patient daughter denies any recent travel, sick contacts or confirmed contact with known COVID-19 infection.  Patient  is also been experiencing episodes of chest discomfort.  Patient describes this pain as pressure-like in quality, midsternal and typically occurring with deep inspiration or cough.  Patient does report that this pain does seem to improve with as needed nitroglycerin however.  Due to progressively worsening symptoms of shortness of breath cough and chest discomfort the patient eventually presented via EMS to [facility] emergency department.  Due to patient's history, and due to observations by EMS of an intermittent left bundle branch block there was initially concern for ACS.  However, shortly after arrival and review of the case with cardiology it was felt the patient was not suffering from acute coronary syndrome.    Upon evaluation in the emergency department patient has been found to be quite hypoxic requiring high flow nasal cannula.  Patient was also found to be febrile upon arrival at 101.4 F.  Because of patient's persisting hypoxia and fever with ongoing complaints of chest discomfort hospitalist group has been called to assess the patient for admission to the hospital.  Interim history Patient admitted with cough and chest discomfort.  Found to have respiratory failure and currently on nasal cannula.  Also being treated for pneumonia. Assessment & Plan   Severe sepsis/acute respiratory failure with hypoxia secondary to multifocal bilateral pneumonia -On admission, patient presented with fever, leukocytosis -Found to be hypoxic and requiring 7 L high flow nasal cannula on admission.  She has been weaned down to 3 L of high flow today. -CT chest showed bilateral lower lobe infiltrates however negative for PE -Currently on ceftriaxone -Blood cultures from 12/23/2019 showed GPC, Staph hominis, likely contaminant -Patient did have shortness of breath early morning of 12/26/2019.  She  was then started on steroids along with Pulmicort -Will continue to wean oxygen as well as  steroids  Pleuritic chest pain/CAD -Likely secondary to pneumonia -Denies current chest pain -Continue Plavix, Imdur, Ranexa and aspirin, atenolol -Cardiology consulted and felt the patient's pain was likely secondary to her pneumonia and can follow-up as an outpatient  Chronic diastolic heart failure -Appears to be euvolemic and compensated -[place] in 2018 showed an EF of 60 to 75%, grade 2 diastolic dysfunction -Monitor intake and output, daily weights -Continue atenolol, Imdur  Hyperlipidemia -Continue statin  GERD -Without esophagitis -Continue PPI, H2 blocker  Deconditioning -PT consulted recommended home health versus supervision/assistance 24 hours  DVT Prophylaxis  Lovenox  Code Status: Full  Family Communication: None at bedside  Disposition Plan:  Status is: Inpatient  Remains inpatient appropriate because:Hemodynamically unstable   Dispo: The patient is from: Home              Anticipated d/c is to: Home              Anticipated d/c date is: 2 days              Patient currently is not medically stable to d/c.  Consultants Cardiology  Procedures  None  Antibiotics   Anti-infectives (From admission, onward)   Start     Dose/Rate Route Frequency Ordered Stop   12/26/19 2000  azithromycin (ZITHROMAX) tablet 500 mg        500 mg Oral  Once 12/26/19 1127 12/26/19 2031   12/23/19 2300  cefTRIAXone (ROCEPHIN) 2 g in sodium chloride 0.9 % 100 mL IVPB        2 g 200 mL/hr over 30 Minutes Intravenous Daily at bedtime 12/23/19 2212 12/28/19 2159   12/23/19 2300  azithromycin (ZITHROMAX) 500 mg in sodium chloride 0.9 % 250 mL IVPB  Status:  Discontinued        500 mg 250 mL/hr over 60 Minutes Intravenous Daily at bedtime 12/23/19 2212 12/26/19 1127      Subjective:   Cindy Brandt seen and examined today.  Patient with no complaints this morning.  Feeling that her breathing has improved as compared to previous days.  Was able to rest overnight.  States she  had a much better night than the previous night.  Denies current abdominal pain, nausea or vomiting, diarrhea constipation, dizziness or headache.  Continues to have cough occasionally.  Objective:   Vitals:   12/27/19 0359 12/27/19 0600 12/27/19 0739 12/27/19 0855  BP: 137/70 127/64 132/71 129/83  Pulse: 71 71 69 71  Resp: 17 16 19    Temp:  98 F (36.7 C) 98 F (36.7 C)   TempSrc:  Oral Oral   SpO2: 97% 97% 96%   Weight:      Height:        Intake/Output Summary (Last 24 hours) at 12/27/2019 1246 Last data filed at 12/27/2019 0600 Gross per 24 hour  Intake 480 ml  Output 50 ml  Net 430 ml   Filed Weights   12/25/19 0500 12/26/19 0417 12/27/19 0012  Weight: 63 kg 63 kg 63.2 kg    Exam  General: Well developed, well nourished, NAD, appears stated age  HEENT: NCAT, mucous membranes moist.   Cardiovascular: S1 S2 auscultated, RRR  Respiratory: Diminished breath sounds, however clear  Abdomen: Soft, nontender, nondistended, + bowel sounds  Extremities: warm dry without cyanosis clubbing or edema  Neuro: AAOx3, hard of hearing, otherwise nonfocal  Psych: pleasant, appropriate mood and  affect   Data Reviewed: I have personally reviewed following labs and imaging studies  CBC: Recent Labs  Lab 12/23/19 1844 12/23/19 2257 12/24/19 0441 12/25/19 1121  WBC 10.7*  --  10.2 5.7  NEUTROABS 9.7*  --  8.1* 4.2  HGB 11.9* 11.2* 11.1* 10.7*  HCT 36.6 33.0* 34.4* 32.9*  MCV 104.9*  --  106.2* 107.2*  PLT 223  --  227 751   Basic Metabolic Panel: Recent Labs  Lab 12/23/19 1804 12/23/19 2257 12/24/19 0441 12/25/19 1121  NA 135 136 134* 136  K 4.6 4.3 3.5 4.1  CL 99  --  100 101  CO2 24  --  24 26  GLUCOSE 161*  --  135* 205*  BUN 17  --  20 24*  CREATININE 0.93  --  0.89 1.02*  CALCIUM 9.5  --  8.8* 8.5*  MG  --   --  1.8  --    GFR: Estimated Creatinine Clearance: 32 mL/min (A) (by C-G formula based on SCr of 1.02 mg/dL (H)). Liver Function  Tests: Recent Labs  Lab 12/23/19 1804 12/24/19 0441  AST 22 19  ALT 15 14  ALKPHOS 57 54  BILITOT 1.1 0.7  PROT 7.6 7.0  ALBUMIN 3.4* 2.8*   Recent Labs  Lab 12/23/19 1804  LIPASE 17   No results for input(s): AMMONIA in the last 168 hours. Coagulation Profile: Recent Labs  Lab 12/23/19 1804  INR 0.9   Cardiac Enzymes: No results for input(s): CKTOTAL, CKMB, CKMBINDEX, TROPONINI in the last 168 hours. BNP (last 3 results) Recent Labs    10/27/19 1123  PROBNP 97.0   HbA1C: No results for input(s): HGBA1C in the last 72 hours. CBG: No results for input(s): GLUCAP in the last 168 hours. Lipid Profile: No results for input(s): CHOL, HDL, LDLCALC, TRIG, CHOLHDL, LDLDIRECT in the last 72 hours. Thyroid Function Tests: No results for input(s): TSH, T4TOTAL, FREET4, T3FREE, THYROIDAB in the last 72 hours. Anemia Panel: No results for input(s): VITAMINB12, FOLATE, FERRITIN, TIBC, IRON, RETICCTPCT in the last 72 hours. Urine analysis:    Component Value Date/Time   COLORURINE YELLOW 12/23/2019 2227   APPEARANCEUR CLEAR 12/23/2019 2227   LABSPEC >1.046 (H) 12/23/2019 2227   PHURINE 5.0 12/23/2019 2227   GLUCOSEU NEGATIVE 12/23/2019 2227   HGBUR MODERATE (A) 12/23/2019 2227   BILIRUBINUR NEGATIVE 12/23/2019 2227   BILIRUBINUR 1+ 04/11/2018 1436   KETONESUR 5 (A) 12/23/2019 2227   PROTEINUR 100 (A) 12/23/2019 2227   UROBILINOGEN 0.2 04/11/2018 1436   UROBILINOGEN 0.2 03/02/2008 1545   NITRITE NEGATIVE 12/23/2019 2227   LEUKOCYTESUR NEGATIVE 12/23/2019 2227   Sepsis Labs: @LABRCNTIP (procalcitonin:4,lacticidven:4)  ) Recent Results (from the past 240 hour(s))  Respiratory Panel by RT PCR (Flu A&B, Covid) - Nasopharyngeal Swab     Status: None   Collection Time: 12/23/19  5:29 PM   Specimen: Nasopharyngeal Swab  Result Value Ref Range Status   SARS Coronavirus 2 by RT PCR NEGATIVE NEGATIVE Final    Comment: (NOTE) SARS-CoV-2 target nucleic acids are NOT  DETECTED.  The SARS-CoV-2 RNA is generally detectable in upper respiratoy specimens during the acute phase of infection. The lowest concentration of SARS-CoV-2 viral copies this assay can detect is 131 copies/mL. A negative result does not preclude SARS-Cov-2 infection and should not be used as the sole basis for treatment or other patient management decisions. A negative result may occur with  improper specimen collection/handling, submission of specimen other than nasopharyngeal swab, presence  of viral mutation(s) within the areas targeted by this assay, and inadequate number of viral copies (<131 copies/mL). A negative result must be combined with clinical observations, patient history, and epidemiological information. The expected result is Negative.  Fact Sheet for Patients:  PinkCheek.be  Fact Sheet for Healthcare Providers:  GravelBags.it  This test is no t yet approved or cleared by the [place] FDA and  has been authorized for detection and/or diagnosis of SARS-CoV-2 by FDA under an Emergency Use Authorization (EUA). This EUA will remain  in effect (meaning this test can be used) for the duration of the COVID-19 declaration under Section 564(b)(1) of the Act, 21 U.S.C. section 360bbb-3(b)(1), unless the authorization is terminated or revoked sooner.     Influenza A by PCR NEGATIVE NEGATIVE Final   Influenza B by PCR NEGATIVE NEGATIVE Final    Comment: (NOTE) The Xpert Xpress SARS-CoV-2/FLU/RSV assay is intended as an aid in  the diagnosis of influenza from Nasopharyngeal swab specimens and  should not be used as a sole basis for treatment. Nasal washings and  aspirates are unacceptable for Xpert Xpress SARS-CoV-2/FLU/RSV  testing.  Fact Sheet for Patients: PinkCheek.be  Fact Sheet for Healthcare Providers: GravelBags.it  This test is not yet  approved or cleared by the [place] FDA and  has been authorized for detection and/or diagnosis of SARS-CoV-2 by  FDA under an Emergency Use Authorization (EUA). This EUA will remain  in effect (meaning this test can be used) for the duration of the  Covid-19 declaration under Section 564(b)(1) of the Act, 21  U.S.C. section 360bbb-3(b)(1), unless the authorization is  terminated or revoked. Performed at [facility], [place] [address], [place], [place] [address]   Blood culture (routine x 2)     Status: Abnormal   Collection Time: 12/23/19  6:04 PM   Specimen: BLOOD  Result Value Ref Range Status   Specimen Description BLOOD LEFT ANTECUBITAL  Final   Special Requests   Final    BOTTLES DRAWN AEROBIC AND ANAEROBIC Blood Culture results may not be optimal due to an inadequate volume of blood received in culture bottles   Culture  Setup Time   Final    GRAM POSITIVE COCCI IN CLUSTERS AEROBIC BOTTLE ONLY CRITICAL RESULT CALLED TO, READ BACK BY AND VERIFIED WITH: K. [place], AT 3149 12/24/19 BY D. VANHOOK    Culture (A)  Final    STAPHYLOCOCCUS HOMINIS THE SIGNIFICANCE OF ISOLATING THIS ORGANISM FROM A SINGLE SET OF BLOOD CULTURES WHEN MULTIPLE SETS ARE DRAWN IS UNCERTAIN. PLEASE NOTIFY THE MICROBIOLOGY DEPARTMENT WITHIN ONE WEEK IF SPECIATION AND SENSITIVITIES ARE REQUIRED. Performed at [facility], [place] [address], [place], [place] [address]    Report Status 12/26/2019 FINAL  Final  Blood Culture ID Panel (Reflexed)     Status: Abnormal   Collection Time: 12/23/19  6:04 PM  Result Value Ref Range Status   Enterococcus faecalis NOT DETECTED NOT DETECTED Final   Enterococcus Faecium NOT DETECTED NOT DETECTED Final   Listeria monocytogenes NOT DETECTED NOT DETECTED Final   Staphylococcus species DETECTED (A) NOT DETECTED Final    Comment: CRITICAL RESULT CALLED TO, READ BACK BY AND VERIFIED WITH: K. HURTH PHARMD, AT 1617 12/24/19 BY D. VANHOOK    Staphylococcus  aureus (BCID) NOT DETECTED NOT DETECTED Final   Staphylococcus epidermidis NOT DETECTED NOT DETECTED Final   Staphylococcus lugdunensis NOT DETECTED NOT DETECTED Final   Streptococcus species NOT DETECTED NOT DETECTED Final  Streptococcus agalactiae NOT DETECTED NOT DETECTED Final   Streptococcus pneumoniae NOT DETECTED NOT DETECTED Final   Streptococcus pyogenes NOT DETECTED NOT DETECTED Final   A.calcoaceticus-baumannii NOT DETECTED NOT DETECTED Final   Bacteroides fragilis NOT DETECTED NOT DETECTED Final   Enterobacterales NOT DETECTED NOT DETECTED Final   Enterobacter cloacae complex NOT DETECTED NOT DETECTED Final   Escherichia coli NOT DETECTED NOT DETECTED Final   Klebsiella aerogenes NOT DETECTED NOT DETECTED Final   Klebsiella oxytoca NOT DETECTED NOT DETECTED Final   Klebsiella pneumoniae NOT DETECTED NOT DETECTED Final   Proteus species NOT DETECTED NOT DETECTED Final   Salmonella species NOT DETECTED NOT DETECTED Final   Serratia marcescens NOT DETECTED NOT DETECTED Final   Haemophilus influenzae NOT DETECTED NOT DETECTED Final   Neisseria meningitidis NOT DETECTED NOT DETECTED Final   Pseudomonas aeruginosa NOT DETECTED NOT DETECTED Final   Stenotrophomonas maltophilia NOT DETECTED NOT DETECTED Final   Candida albicans NOT DETECTED NOT DETECTED Final   Candida auris NOT DETECTED NOT DETECTED Final   Candida glabrata NOT DETECTED NOT DETECTED Final   Candida krusei NOT DETECTED NOT DETECTED Final   Candida parapsilosis NOT DETECTED NOT DETECTED Final   Candida tropicalis NOT DETECTED NOT DETECTED Final   Cryptococcus neoformans/gattii NOT DETECTED NOT DETECTED Final    Comment: Performed at [facility], [address], [place], [place] [address]  Blood culture (routine x 2)     Status: None (Preliminary result)   Collection Time: 12/23/19  6:08 PM   Specimen: BLOOD  Result Value Ref Range Status   Specimen Description BLOOD SITE NOT SPECIFIED  Final   Special  Requests   Final    BOTTLES DRAWN AEROBIC AND ANAEROBIC Blood Culture adequate volume   Culture   Final    NO GROWTH 4 DAYS Performed at [facility], [address] [place], [place] [address]    Report Status PENDING  Incomplete  Urine culture     Status: None   Collection Time: 12/23/19 10:27 PM   Specimen: Urine, Random  Result Value Ref Range Status   Specimen Description URINE, RANDOM  Final   Special Requests NONE  Final   Culture   Final    NO GROWTH Performed at [facility], [place] [address] [place], [place] [address]    Report Status 12/25/2019 FINAL  Final      Radiology Studies: DG CHEST PORT 1 VIEW  Result Date: 12/25/2019 CLINICAL DATA:  Chest pain and shortness of breath. EXAM: PORTABLE CHEST 1 VIEW COMPARISON:  Radiograph and CT 12/23/2019 FINDINGS: Stable mild cardiomegaly. Unchanged mediastinal contours. Aortic atherosclerosis. Chronic bronchial thickening with mild emphysema on CT. Slight increase in streaky opacities in the right perihilar lung. Left lower lobe airspace disease is similar to CT. No pulmonary edema or pneumothorax. No large pleural effusion. Stable osseous structures. IMPRESSION: 1. Slight increase in streaky right perihilar opacities, favoring atelectasis. 2. Stable left lower lobe airspace disease/atelectasis. 3. Stable mild cardiomegaly. Electronically Signed   By: Keith Rake M.D.   On: 12/25/2019 23:29     Scheduled Meds: . albuterol  2.5 mg Nebulization TID  . amLODipine  2.5 mg Oral Daily  . aspirin EC  81 mg Oral q morning - 10a  . atenolol  25 mg Oral Daily  . atorvastatin  80 mg Oral QPM  . budesonide (PULMICORT) nebulizer solution  0.25 mg Nebulization BID  . busPIRone  7.5 mg Oral BID  .  clopidogrel  75 mg Oral QHS  . enoxaparin (LOVENOX) injection  40 mg Subcutaneous Daily  . famotidine  20 mg Oral Daily  . fluticasone  1 spray Each Nare Daily  . gabapentin  100 mg Oral BID  . isosorbide mononitrate  60 mg  Oral q AM  . loratadine  10 mg Oral Daily  . methylPREDNISolone (SOLU-MEDROL) injection  40 mg Intravenous BID  . pantoprazole  40 mg Oral QAC breakfast  . ranolazine  500 mg Oral BID  . sertraline  100 mg Oral QHS  . umeclidinium bromide  1 puff Inhalation Daily   Continuous Infusions: . cefTRIAXone (ROCEPHIN)  IV 2 g (12/26/19 2051)     LOS: 4 days   Time Spent in minutes   45 minutes  Lavenia Stumpo D.O. on 12/27/2019 at 12:46 PM  Between 7am to 7pm - Please see pager noted on [email or web address]  After 7pm go to [email or web address]  And look for the night coverage person covering for me after hours  [facility] Office  [phone]

## 2019-12-28 DIAGNOSIS — J189 Pneumonia, unspecified organism: Secondary | ICD-10-CM

## 2019-12-28 DIAGNOSIS — R0781 Pleurodynia: Secondary | ICD-10-CM | POA: Diagnosis not present

## 2019-12-28 DIAGNOSIS — J9601 Acute respiratory failure with hypoxia: Secondary | ICD-10-CM | POA: Diagnosis not present

## 2019-12-28 LAB — CULTURE, BLOOD (ROUTINE X 2)
Culture: NO GROWTH
Special Requests: ADEQUATE

## 2019-12-28 LAB — TROPONIN I (HIGH SENSITIVITY)
Troponin I (High Sensitivity): 7 ng/L (ref <18)
Troponin I (High Sensitivity): 4 ng/L (ref <18)

## 2019-12-28 MED ORDER — METHYLPREDNISOLONE SODIUM SUCC 40 MG IJ SOLR
40.0000 mg | Freq: Every day | INTRAMUSCULAR | Status: DC
  Administered 2019-12-29: 40 mg via INTRAVENOUS
  Filled 2019-12-28: qty 1

## 2019-12-28 NOTE — Progress Notes (Signed)
DAILY PROGRESS NOTE   Patient Name: Cindy Brandt Date of Encounter: 12/28/2019 Cardiologist: Quay Burow, MD  Chief Complaint   Chest pain  Patient Profile   Cindy Brandt is an 86 year old female with multivessel CAD,hypertension, dyslipidemia, HFpEF, remote smoking history, remote smoking history (smoked for over 30 years) and gastroesophageal reflux disease who presents to the [facility] emergency department for evaluation of shortness of breath and chest discomfort. Cardiology is now consulted for transient LBBB on ECG in the setting of her chest pain.   Subjective   Asked to see patient again regarding nitrate responsive chest pain. I have personally reviewed the cardiology consult note dated 12/24/2019. She presented with fever, leukocytosis and cough. Chest pain on admission was though to be pleuritic. Troponins have been negative (16, 9, 7). BNP is low at 96. She does have known CAD with chronic total occlusion of the RCA and left to right collaterals based on cath in 2019. She has been seen recently for nitrate responsive chest pain which lead to adding ranolazine and an increase in her nitrates.   Objective   Vitals:   12/28/19 0115 12/28/19 0200 12/28/19 0300 12/28/19 0916  BP: (!) 167/87 126/68 (!) 148/69   Pulse: 90 78 64 71  Resp: 19 18 20 17   Temp:   97.8 F (36.6 C)   TempSrc:   Oral   SpO2: 95% 95% 98% 96%  Weight:   63.9 kg   Height:        Intake/Output Summary (Last 24 hours) at 12/28/2019 1134 Last data filed at 12/28/2019 0459 Gross per 24 hour  Intake --  Output 1050 ml  Net -1050 ml   Filed Weights   12/26/19 0417 12/27/19 0012 12/28/19 0300  Weight: 63 kg 63.2 kg 63.9 kg    Physical Exam   General appearance: alert and no distress Neck: no carotid bruit, no JVD and thyroid not enlarged, symmetric, no tenderness/mass/nodules Lungs: clear to auscultation bilaterally Heart: regular rate and rhythm Abdomen: soft, non-tender; bowel sounds  normal; no masses,  no organomegaly Extremities: extremities normal, atraumatic, no cyanosis or edema Pulses: 2+ and symmetric Skin: Skin color, texture, turgor normal. No rashes or lesions Neurologic: Mental status: Alert, oriented, thought content appropriate Psych: Pleasant  Inpatient Medications    Scheduled Meds: . albuterol  2.5 mg Nebulization TID  . amLODipine  2.5 mg Oral Daily  . aspirin EC  81 mg Oral q morning - 10a  . atenolol  25 mg Oral Daily  . atorvastatin  80 mg Oral QPM  . budesonide (PULMICORT) nebulizer solution  0.25 mg Nebulization BID  . busPIRone  7.5 mg Oral BID  . clopidogrel  75 mg Oral QHS  . enoxaparin (LOVENOX) injection  40 mg Subcutaneous Daily  . famotidine  20 mg Oral Daily  . fluticasone  1 spray Each Nare Daily  . gabapentin  100 mg Oral BID  . isosorbide mononitrate  60 mg Oral q AM  . loratadine  10 mg Oral Daily  . methylPREDNISolone (SOLU-MEDROL) injection  40 mg Intravenous BID  . pantoprazole  40 mg Oral QAC breakfast  . ranolazine  500 mg Oral BID  . sertraline  100 mg Oral QHS  . umeclidinium bromide  1 puff Inhalation Daily    Continuous Infusions:   PRN Meds: acetaminophen **OR** acetaminophen, albuterol, diclofenac Sodium, melatonin, nitroGLYCERIN, ondansetron **OR** ondansetron (ZOFRAN) IV, polyethylene glycol   Labs   Results for orders placed or performed during the  hospital encounter of 12/23/19 (from the past 48 hour(s))  Troponin I (High Sensitivity)     Status: None   Collection Time: 12/28/19  7:15 AM  Result Value Ref Range   Troponin I (High Sensitivity) 7 <18 ng/L    Comment: (NOTE) Elevated high sensitivity troponin I (hsTnI) values and significant  changes across serial measurements may suggest ACS but many other  chronic and acute conditions are known to elevate hsTnI results.  Refer to the "Links" section for chest pain algorithms and additional  guidance. Performed at [facility], [place] [address], [place], [place] [address]     ECG   Sinus rhythm, inferior infarct pattern, no ischemia - Personally Reviewed  Telemetry   Sinus rhythm - Personally Reviewed  Radiology    No results found.  Cardiac Studies   N/A  Assessment   1. Principal Problem: 2.   Acute respiratory failure with hypoxia (HCC) 3. Active Problems: 4.   Mixed hyperlipidemia 5.   Essential hypertension 6.   Coronary artery disease involving native coronary artery of native heart 7.   Chest pain 8.   GERD without esophagitis 9.   Pneumonia of both lungs due to infectious organism 10.   Chronic diastolic CHF (congestive heart failure) ([place]) 11.   Sepsis ([place]) 12.   Left bundle branch block 13.   Plan   1. Noted to have coughing, breathing difficulty and chest discomfort overnight- per nursing she was given albeterol, tylenol and nitro - symptoms eventually resolved. Not clear this was angina - chest pain is still pleuritic and associated with coughing. Troponins (including repeat) have been negative - no ischemic EKG changes. BNP normal. She does have CTO's with collaterals - if she were having ongoing ischemic pain, only options would be CTO intervention, which would require special preparation and not likely to be done during this admission. I would primarily treat for wheezing/cough suppression and pleurisy and try to avoid additional nitro. Continue daily imdur and ranexa. Plan was for follow-up with Dr. Gwenlyn Found as an outpatient. Will sign-off - please call with questions.  [facility] will sign off.   Medication Recommendations:  As above Other recommendations (labs, testing, etc):  none Follow up as an outpatient:  Dr. Gwenlyn Found   Time Spent Directly with Patient:  I have spent a total of 25 minutes with the patient reviewing hospital notes, telemetry, EKGs, labs and examining the patient as well as establishing an assessment and plan that was discussed personally with the patient.  > 50% of time  was spent in direct patient care.  Length of Stay:  LOS: 5 days   Pixie Casino, MD, [facility], [place] Director of the Advanced Lipid Disorders &  Cardiovascular Risk Reduction [facility] Diplomate of the American Board of Clinical Lipidology Attending Cardiologist  Direct Dial: [phone]  Fax: [phone]  Website:  [email or web address] Colbi Schiltz 12/28/2019, 11:34 AM

## 2019-12-28 NOTE — Care Management Important Message (Signed)
Important Message  Patient Details  Name: Cindy Brandt MRN: [id number] Date of Birth: January 27, 1932   Medicare Important Message Given:  Yes     Shelda Altes 12/28/2019, 8:37 AM

## 2019-12-28 NOTE — Progress Notes (Addendum)
0000 Pt coughing, SOB, complaining of chest pain, wheezing upon auscultation. BP 169/83, HR 98, RR 30, spO2 80's on 3L. PRN tylenol, albuterol and nitro given. Pt states she feels better. RR 20 HR 84 SpO2 95 on 4L. BP 109/69. Will continue to monitor.   0113 Pt complained her chest hurting due to coughing, VS and EKG taken, relieved with nitro. Pt resting comfortably at this time.

## 2019-12-28 NOTE — Progress Notes (Signed)
PROGRESS NOTE    Cindy Brandt  EVO:[id number] DOB: Jan 31, 1932 DOA: 12/23/2019 PCP: [name], NP   Brief Narrative:  HPI on 12/23/2019 by Dr. Inda Merlin 86 year old female with past medical history of coronary artery disease (NSTEMI in 2018, multivessel disease, last cath 10/2017), hypertension, hyperlipidemia, diastolic congestive heart failure (Echo 05/2016 EF 60-65%), prediabetes and gastroesophageal reflux disease who presents to [facility] emergency department via EMS due to complaints of cough and chest discomfort.  Patient is a rather poor historian and the majority the history has been obtained from the daughter who is at the bedside at time of interview.  The patient has been experiencing symptoms of cough and "congestion" for approximately 8 weeks, since early August.  Patient's cough has been intermittently productive with thick white mucus.  Patient's symptoms have been gradually worsening over the span of time.  Patient presented to a [place] urgent care clinic in August and at that time was placed on a course of doxycycline and prednisone without clinical improvement.  Patient has been following with her primary care provider since and due to persisting symptoms of cough with progressively worsening shortness of breath a referral to pulmonology was placed during the 10/15 appointment.  Patient's daughter explains that in the past 3 or 4 days the patient has exhibited progressively worsening lethargy and weakness.  This is worsened to the point where the patient hardly ever is out of bed due to exhaustion.  Patient shortness of breath is continue to worsen as well and is severe in intensity with minimal exertion.  Patient denies paroxysmal nocturnal dyspnea or pillow orthopnea.  Patient denies lower extremity edema.  Upon further questioning patient daughter denies any recent travel, sick contacts or confirmed contact with known COVID-19 infection.  Patient  is also been experiencing episodes of chest discomfort.  Patient describes this pain as pressure-like in quality, midsternal and typically occurring with deep inspiration or cough.  Patient does report that this pain does seem to improve with as needed nitroglycerin however.  Due to progressively worsening symptoms of shortness of breath cough and chest discomfort the patient eventually presented via EMS to [facility] emergency department.  Due to patient's history, and due to observations by EMS of an intermittent left bundle branch block there was initially concern for ACS.  However, shortly after arrival and review of the case with cardiology it was felt the patient was not suffering from acute coronary syndrome.    Upon evaluation in the emergency department patient has been found to be quite hypoxic requiring high flow nasal cannula.  Patient was also found to be febrile upon arrival at 101.4 F.  Because of patient's persisting hypoxia and fever with ongoing complaints of chest discomfort hospitalist group has been called to assess the patient for admission to the hospital.  Interim history Patient admitted with cough and chest discomfort.  Found to have respiratory failure and currently on nasal cannula.  Also being treated for pneumonia. Assessment & Plan   Severe sepsis/acute respiratory failure with hypoxia secondary to multifocal bilateral pneumonia -On admission, patient presented with fever, leukocytosis -Found to be hypoxic and requiring 7 L high flow nasal cannula on admission.  She has been weaned down to 3 L of high flow today. -CT chest showed bilateral lower lobe infiltrates however negative for PE -Currently on ceftriaxone -Blood cultures from 12/23/2019 showed GPC, Staph hominis, likely contaminant -Patient did have shortness of breath early morning of 12/26/2019.  She  was then started on steroids along with Pulmicort -Will continue to wean oxygen if possible -Will  decrease Solu-Medrol to 40 mg daily  Pleuritic chest pain/CAD -Likely secondarWe will continue to wean steroidsy to pneumonia -Denies current chest pain -Continue Plavix, Imdur, Ranexa and aspirin, atenolol -Cardiology consulted and felt the patient's pain was likely secondary to her pneumonia and can follow-up as an outpatient -Overnight, patient had another episode of chest pain along with shortness of breath following coughing spell.  He was given Tylenol, albuterol and nitroglycerin.  She had another episode of chest pain during coughing and was given nitroglycerin which helped relieve her pain.  EKG was obtained and unremarkable for acute findings. -Discussed with Dr. Debara Pickett, cardiology, continue current Imdur and Ranexa with outpatient follow-up.  No ischemic changes noted on EKG.  Patient does have CTO's with collaterals, if she continued to have ischemic pain she would need intervention which would require special preparation not likely to occur during this admission.  Feel pain is likely pleuritic in nature.  Chronic diastolic heart failure -Appears to be euvolemic and compensated -[place] in 2018 showed an EF of 60 to 32%, grade 2 diastolic dysfunction -Monitor intake and output, daily weights -Continue atenolol, Imdur  Hyperlipidemia -Continue statin  GERD -Without esophagitis -Continue PPI, H2 blocker  Deconditioning -PT consulted recommended home health versus supervision/assistance 24 hours  DVT Prophylaxis  Lovenox  Code Status: Full  Family Communication: None at bedside  Disposition Plan:  Status is: Inpatient  Remains inpatient appropriate because:Hemodynamically unstable- still requiring high flow oxygen.   Dispo: The patient is from: Home              Anticipated d/c is to: Home with Home health              Anticipated d/c date is: 2 days              Patient currently is not medically stable to d/c.  Consultants Cardiology  Procedures   None  Antibiotics   Anti-infectives (From admission, onward)   Start     Dose/Rate Route Frequency Ordered Stop   12/26/19 2000  azithromycin (ZITHROMAX) tablet 500 mg        500 mg Oral  Once 12/26/19 1127 12/26/19 2031   12/23/19 2300  cefTRIAXone (ROCEPHIN) 2 g in sodium chloride 0.9 % 100 mL IVPB        2 g 200 mL/hr over 30 Minutes Intravenous Daily at bedtime 12/23/19 2212 12/28/19 0752   12/23/19 2300  azithromycin (ZITHROMAX) 500 mg in sodium chloride 0.9 % 250 mL IVPB  Status:  Discontinued        500 mg 250 mL/hr over 60 Minutes Intravenous Daily at bedtime 12/23/19 2212 12/26/19 1127      Subjective:   [name] seen and examined today.  Patient states she did have chest pain overnight and earlier this morning although feels that it is improved.  Denies current abdominal pain, nausea or vomiting, diarrhea or constipation, dizziness or headache.  Continues to have cough.    Objective:   Vitals:   12/28/19 0115 12/28/19 0200 12/28/19 0300 12/28/19 0916  BP: (!) 167/87 126/68 (!) 148/69   Pulse: 90 78 64 71  Resp: 19 18 20 17   Temp:   97.8 F (36.6 C)   TempSrc:   Oral   SpO2: 95% 95% 98% 96%  Weight:   63.9 kg   Height:        Intake/Output Summary (Last  24 hours) at 12/28/2019 1302 Last data filed at 12/28/2019 0459 Gross per 24 hour  Intake --  Output 1050 ml  Net -1050 ml   Filed Weights   12/26/19 0417 12/27/19 0012 12/28/19 0300  Weight: 63 kg 63.2 kg 63.9 kg   Exam  General: Well developed, well nourished, NAD, appears stated age  36: NCAT, mucous membranes moist.   Cardiovascular: S1 S2 auscultated, RRR  Respiratory: Diminished however clear  Abdomen: Soft, nontender, nondistended, + bowel sounds  Extremities: warm dry without cyanosis clubbing or edema  Neuro: AAOx3, hard of hearing otherwise nonfocal  Psych: appropriate mood and affect, pleasant  Data Reviewed: I have personally reviewed following labs and imaging  studies  CBC: Recent Labs  Lab 12/23/19 1844 12/23/19 2257 12/24/19 0441 12/25/19 1121  WBC 10.7*  --  10.2 5.7  NEUTROABS 9.7*  --  8.1* 4.2  HGB 11.9* 11.2* 11.1* 10.7*  HCT 36.6 33.0* 34.4* 32.9*  MCV 104.9*  --  106.2* 107.2*  PLT 223  --  227 284   Basic Metabolic Panel: Recent Labs  Lab 12/23/19 1804 12/23/19 2257 12/24/19 0441 12/25/19 1121  NA 135 136 134* 136  K 4.6 4.3 3.5 4.1  CL 99  --  100 101  CO2 24  --  24 26  GLUCOSE 161*  --  135* 205*  BUN 17  --  20 24*  CREATININE 0.93  --  0.89 1.02*  CALCIUM 9.5  --  8.8* 8.5*  MG  --   --  1.8  --    GFR: Estimated Creatinine Clearance: 32.2 mL/min (A) (by C-G formula based on SCr of 1.02 mg/dL (H)). Liver Function Tests: Recent Labs  Lab 12/23/19 1804 12/24/19 0441  AST 22 19  ALT 15 14  ALKPHOS 57 54  BILITOT 1.1 0.7  PROT 7.6 7.0  ALBUMIN 3.4* 2.8*   Recent Labs  Lab 12/23/19 1804  LIPASE 17   No results for input(s): AMMONIA in the last 168 hours. Coagulation Profile: Recent Labs  Lab 12/23/19 1804  INR 0.9   Cardiac Enzymes: No results for input(s): CKTOTAL, CKMB, CKMBINDEX, TROPONINI in the last 168 hours. BNP (last 3 results) Recent Labs    10/27/19 1123  PROBNP 97.0   HbA1C: No results for input(s): HGBA1C in the last 72 hours. CBG: No results for input(s): GLUCAP in the last 168 hours. Lipid Profile: No results for input(s): CHOL, HDL, LDLCALC, TRIG, CHOLHDL, LDLDIRECT in the last 72 hours. Thyroid Function Tests: No results for input(s): TSH, T4TOTAL, FREET4, T3FREE, THYROIDAB in the last 72 hours. Anemia Panel: No results for input(s): VITAMINB12, FOLATE, FERRITIN, TIBC, IRON, RETICCTPCT in the last 72 hours. Urine analysis:    Component Value Date/Time   COLORURINE YELLOW 12/23/2019 2227   APPEARANCEUR CLEAR 12/23/2019 2227   LABSPEC >1.046 (H) 12/23/2019 2227   PHURINE 5.0 12/23/2019 2227   GLUCOSEU NEGATIVE 12/23/2019 2227   HGBUR MODERATE (A) 12/23/2019 2227    BILIRUBINUR NEGATIVE 12/23/2019 2227   BILIRUBINUR 1+ 04/11/2018 1436   KETONESUR 5 (A) 12/23/2019 2227   PROTEINUR 100 (A) 12/23/2019 2227   UROBILINOGEN 0.2 04/11/2018 1436   UROBILINOGEN 0.2 03/02/2008 1545   NITRITE NEGATIVE 12/23/2019 2227   LEUKOCYTESUR NEGATIVE 12/23/2019 2227   Sepsis Labs: @LABRCNTIP (procalcitonin:4,lacticidven:4)  ) Recent Results (from the past 240 hour(s))  Respiratory Panel by RT PCR (Flu A&B, Covid) - Nasopharyngeal Swab     Status: None   Collection Time: 12/23/19  5:29 PM  Specimen: Nasopharyngeal Swab  Result Value Ref Range Status   SARS Coronavirus 2 by RT PCR NEGATIVE NEGATIVE Final    Comment: (NOTE) SARS-CoV-2 target nucleic acids are NOT DETECTED.  The SARS-CoV-2 RNA is generally detectable in upper respiratoy specimens during the acute phase of infection. The lowest concentration of SARS-CoV-2 viral copies this assay can detect is 131 copies/mL. A negative result does not preclude SARS-Cov-2 infection and should not be used as the sole basis for treatment or other patient management decisions. A negative result may occur with  improper specimen collection/handling, submission of specimen other than nasopharyngeal swab, presence of viral mutation(s) within the areas targeted by this assay, and inadequate number of viral copies (<131 copies/mL). A negative result must be combined with clinical observations, patient history, and epidemiological information. The expected result is Negative.  Fact Sheet for Patients:  PinkCheek.be  Fact Sheet for Healthcare Providers:  GravelBags.it  This test is no t yet approved or cleared by the [place] FDA and  has been authorized for detection and/or diagnosis of SARS-CoV-2 by FDA under an Emergency Use Authorization (EUA). This EUA will remain  in effect (meaning this test can be used) for the duration of the COVID-19 declaration  under Section 564(b)(1) of the Act, 21 U.S.C. section 360bbb-3(b)(1), unless the authorization is terminated or revoked sooner.     Influenza A by PCR NEGATIVE NEGATIVE Final   Influenza B by PCR NEGATIVE NEGATIVE Final    Comment: (NOTE) The Xpert Xpress SARS-CoV-2/FLU/RSV assay is intended as an aid in  the diagnosis of influenza from Nasopharyngeal swab specimens and  should not be used as a sole basis for treatment. Nasal washings and  aspirates are unacceptable for Xpert Xpress SARS-CoV-2/FLU/RSV  testing.  Fact Sheet for Patients: PinkCheek.be  Fact Sheet for Healthcare Providers: GravelBags.it  This test is not yet approved or cleared by the [place] FDA and  has been authorized for detection and/or diagnosis of SARS-CoV-2 by  FDA under an Emergency Use Authorization (EUA). This EUA will remain  in effect (meaning this test can be used) for the duration of the  Covid-19 declaration under Section 564(b)(1) of the Act, 21  U.S.C. section 360bbb-3(b)(1), unless the authorization is  terminated or revoked. Performed at [facility], [place] [address], [place], [place] [address]   Blood culture (routine x 2)     Status: Abnormal   Collection Time: 12/23/19  6:04 PM   Specimen: BLOOD  Result Value Ref Range Status   Specimen Description BLOOD LEFT ANTECUBITAL  Final   Special Requests   Final    BOTTLES DRAWN AEROBIC AND ANAEROBIC Blood Culture results may not be optimal due to an inadequate volume of blood received in culture bottles   Culture  Setup Time   Final    GRAM POSITIVE COCCI IN CLUSTERS AEROBIC BOTTLE ONLY CRITICAL RESULT CALLED TO, READ BACK BY AND VERIFIED WITH: K. Sherwood, AT 3474 12/24/19 BY D. VANHOOK    Culture (A)  Final    STAPHYLOCOCCUS HOMINIS THE SIGNIFICANCE OF ISOLATING THIS ORGANISM FROM A SINGLE SET OF BLOOD CULTURES WHEN MULTIPLE SETS ARE DRAWN IS UNCERTAIN. PLEASE NOTIFY THE  MICROBIOLOGY DEPARTMENT WITHIN ONE WEEK IF SPECIATION AND SENSITIVITIES ARE REQUIRED. Performed at [facility], [place] [address] [place], [place] [address]    Report Status 12/26/2019 FINAL  Final  Blood Culture ID Panel (Reflexed)     Status: Abnormal   Collection Time: 12/23/19  6:04  PM  Result Value Ref Range Status   Enterococcus faecalis NOT DETECTED NOT DETECTED Final   Enterococcus Faecium NOT DETECTED NOT DETECTED Final   Listeria monocytogenes NOT DETECTED NOT DETECTED Final   Staphylococcus species DETECTED (A) NOT DETECTED Final    Comment: CRITICAL RESULT CALLED TO, READ BACK BY AND VERIFIED WITH: K. HURTH PHARMD, AT 1617 12/24/19 BY D. VANHOOK    Staphylococcus aureus (BCID) NOT DETECTED NOT DETECTED Final   Staphylococcus epidermidis NOT DETECTED NOT DETECTED Final   Staphylococcus lugdunensis NOT DETECTED NOT DETECTED Final   Streptococcus species NOT DETECTED NOT DETECTED Final   Streptococcus agalactiae NOT DETECTED NOT DETECTED Final   Streptococcus pneumoniae NOT DETECTED NOT DETECTED Final   Streptococcus pyogenes NOT DETECTED NOT DETECTED Final   A.calcoaceticus-baumannii NOT DETECTED NOT DETECTED Final   Bacteroides fragilis NOT DETECTED NOT DETECTED Final   Enterobacterales NOT DETECTED NOT DETECTED Final   Enterobacter cloacae complex NOT DETECTED NOT DETECTED Final   Escherichia coli NOT DETECTED NOT DETECTED Final   Klebsiella aerogenes NOT DETECTED NOT DETECTED Final   Klebsiella oxytoca NOT DETECTED NOT DETECTED Final   Klebsiella pneumoniae NOT DETECTED NOT DETECTED Final   Proteus species NOT DETECTED NOT DETECTED Final   Salmonella species NOT DETECTED NOT DETECTED Final   Serratia marcescens NOT DETECTED NOT DETECTED Final   Haemophilus influenzae NOT DETECTED NOT DETECTED Final   Neisseria meningitidis NOT DETECTED NOT DETECTED Final   Pseudomonas aeruginosa NOT DETECTED NOT DETECTED Final   Stenotrophomonas maltophilia NOT DETECTED NOT  DETECTED Final   Candida albicans NOT DETECTED NOT DETECTED Final   Candida auris NOT DETECTED NOT DETECTED Final   Candida glabrata NOT DETECTED NOT DETECTED Final   Candida krusei NOT DETECTED NOT DETECTED Final   Candida parapsilosis NOT DETECTED NOT DETECTED Final   Candida tropicalis NOT DETECTED NOT DETECTED Final   Cryptococcus neoformans/gattii NOT DETECTED NOT DETECTED Final    Comment: Performed at [facility], [address], [place], [place] [address]  Blood culture (routine x 2)     Status: None   Collection Time: 12/23/19  6:08 PM   Specimen: BLOOD  Result Value Ref Range Status   Specimen Description BLOOD SITE NOT SPECIFIED  Final   Special Requests   Final    BOTTLES DRAWN AEROBIC AND ANAEROBIC Blood Culture adequate volume   Culture   Final    NO GROWTH 5 DAYS Performed at [facility], [address], [place], [place] [address]    Report Status 12/28/2019 FINAL  Final  Urine culture     Status: None   Collection Time: 12/23/19 10:27 PM   Specimen: Urine, Random  Result Value Ref Range Status   Specimen Description URINE, RANDOM  Final   Special Requests NONE  Final   Culture   Final    NO GROWTH Performed at [facility], [place] [address], [place], [place] [address]    Report Status 12/25/2019 FINAL  Final      Radiology Studies: No results found.   Scheduled Meds: . albuterol  2.5 mg Nebulization TID  . amLODipine  2.5 mg Oral Daily  . aspirin EC  81 mg Oral q morning - 10a  . atenolol  25 mg Oral Daily  . atorvastatin  80 mg Oral QPM  . budesonide (PULMICORT) nebulizer solution  0.25 mg Nebulization BID  . busPIRone  7.5 mg Oral BID  . clopidogrel  75 mg Oral QHS  .  enoxaparin (LOVENOX) injection  40 mg Subcutaneous Daily  . famotidine  20 mg Oral Daily  . fluticasone  1 spray Each Nare Daily  . gabapentin  100 mg Oral BID  . isosorbide mononitrate  60 mg Oral q AM  . loratadine  10 mg Oral Daily  . methylPREDNISolone  (SOLU-MEDROL) injection  40 mg Intravenous BID  . pantoprazole  40 mg Oral QAC breakfast  . ranolazine  500 mg Oral BID  . sertraline  100 mg Oral QHS  . umeclidinium bromide  1 puff Inhalation Daily   Continuous Infusions:    LOS: 5 days   Time Spent in minutes   45 minutes  Jermayne Sweeney D.O. on 12/28/2019 at 1:02 PM  Between 7am to 7pm - Please see pager noted on [email or web address]  After 7pm go to [email or web address]  And look for the night coverage person covering for me after hours  [facility] Office  [phone]

## 2019-12-28 NOTE — TOC Progression Note (Signed)
Transition of Care [facility]) - Progression Note    Patient Details  Name: Cindy Brandt MRN: [id number] Date of Birth: 04/24/1931  [facility]) CM/SW Contact  Zenon Mayo, RN Phone Number: 12/28/2019, 9:56 PM  Clinical Narrative:    Patient from home, Severe Sepsis, PNA, resp failure, conts on solumedrol.  PT rec HHPT , TOC team will continue to follow for dc needs.        Expected Discharge Plan and Services                                                 Social Determinants of Health (SDOH) Interventions    Readmission Risk Interventions No flowsheet data found.

## 2019-12-28 NOTE — Progress Notes (Signed)
Physical Therapy Treatment Patient Details Name: Cindy Brandt MRN: [id number] DOB: 1931-05-15 Today's Date: 12/28/2019    History of Present Illness pt is an 86 y.o. female past medical history of coronary artery disease in 2018 with multivessel involvement with the last cath in 0962, chronic diastolic heart failure, diet-controlled diabetes mellitus comes into the hospital for cough and chest discomfort    PT Comments    Pt progressing slowly towards her physical therapy goals. Session focused on functional mobility and toileting. Pt requiring min assist for ambulating 10 feet with no assistive device; often reaching for external objects for additional support. SpO2 > 90% on 4L O2, HR stable. Recommended use of walker for additional stability. Has good family support and will benefit from follow up [place].     Follow Up Recommendations  Home health PT;Supervision/Assistance - 24 hour     Equipment Recommendations  3in1 (PT)    Recommendations for Other Services       Precautions / Restrictions Precautions Precautions: Fall Restrictions Weight Bearing Restrictions: No    Mobility  Bed Mobility Overal bed mobility: Needs Assistance Bed Mobility: Supine to Sit     Supine to sit: Supervision        Transfers Overall transfer level: Needs assistance Equipment used: 1 person hand held assist Transfers: Sit to/from Stand;Stand Pivot Transfers Sit to Stand: Min guard Stand pivot transfers: Min assist       General transfer comment: Min guard to rise from edge of bed, minA for stability with pivot onto [facility]  Ambulation/Gait Ambulation/Gait assistance: Min assist Gait Distance (Feet): 10 Feet Assistive device: None Gait Pattern/deviations: Step-through pattern;Decreased stride length;Trunk flexed Gait velocity: decreased Gait velocity interpretation: <1.31 ft/sec, indicative of household ambulator General Gait Details: Pt requiring minA for stability, reaching for  external support    Stairs             Wheelchair Mobility    Modified Rankin (Stroke Patients Only)       Balance Overall balance assessment: Needs assistance Sitting-balance support: Feet supported Sitting balance-Leahy Scale: Good     Standing balance support: Bilateral upper extremity supported Standing balance-Leahy Scale: Poor Standing balance comment: required B UE support during static and dynamic standing                            Cognition Arousal/Alertness: Awake/alert Behavior During Therapy: WFL for tasks assessed/performed Overall Cognitive Status: Within Functional Limits for tasks assessed                                        Exercises      General Comments        Pertinent Vitals/Pain Pain Assessment: No/denies pain    Home Living                      Prior Function            PT Goals (current goals can now be found in the care plan section) Acute Rehab PT Goals Patient Stated Goal: home Potential to Achieve Goals: Good Progress towards PT goals: Progressing toward goals    Frequency    Min 3X/week      PT Plan Current plan remains appropriate    Co-evaluation              AM-PAC PT "  6 Clicks" Mobility   Outcome Measure  Help needed turning from your back to your side while in a flat bed without using bedrails?: None Help needed moving from lying on your back to sitting on the side of a flat bed without using bedrails?: A Little Help needed moving to and from a bed to a chair (including a wheelchair)?: A Little Help needed standing up from a chair using your arms (e.g., wheelchair or bedside chair)?: A Little Help needed to walk in hospital room?: A Little Help needed climbing 3-5 steps with a railing? : A Lot 6 Click Score: 18    End of Session Equipment Utilized During Treatment: Gait belt Activity Tolerance: Patient tolerated treatment well Patient left: in chair;with  call bell/phone within reach;with chair alarm set Nurse Communication: Mobility status PT Visit Diagnosis: Unsteadiness on feet (R26.81);Muscle weakness (generalized) (M62.81);Other abnormalities of gait and mobility (R26.89)     Time: 8022-3361 PT Time Calculation (min) (ACUTE ONLY): 34 min  Charges:  $Therapeutic Activity: 23-37 mins                     Wyona Almas, PT, DPT Acute Rehabilitation Services Pager [phone] Office [phone]    Deno Etienne 12/28/2019, 11:00 AM

## 2019-12-29 MED ORDER — PREDNISONE 10 MG PO TABS: ORAL_TABLET | ORAL | 0 refills | Status: AC

## 2019-12-29 MED ORDER — ALBUTEROL SULFATE (2.5 MG/3ML) 0.083% IN NEBU: 2.5000 mg | INHALATION_SOLUTION | Freq: Two times a day (BID) | RESPIRATORY_TRACT | Status: DC

## 2019-12-29 NOTE — Evaluation (Signed)
Occupational Therapy Evaluation Patient Details Name: Cindy Brandt MRN: [id number] DOB: 1931-05-28 Today's Date: 12/29/2019    History of Present Illness pt is an 86 y.o. female past medical history of coronary artery disease in 2018 with multivessel involvement with the last cath in 7209, chronic diastolic heart failure, diet-controlled diabetes mellitus comes into the hospital for cough and chest discomfort   Clinical Impression   Pt admitted with the above diagnoses and presents with below problem list. Pt will benefit from continued acute OT to address the below listed deficits and maximize independence with basic ADLs prior to d/c home. PTA pt was independent with basic ADLs. Lives with family who work during the day. Pt is currently min guard A with LB ADLs and functional mobility/transfers. Utilized rw and worked on Nutritional therapist during session. Daughter present throughout session.      Follow Up Recommendations  Home health OT;Supervision - Intermittent    Equipment Recommendations  None recommended by OT    Recommendations for Other Services       Precautions / Restrictions Precautions Precautions: Fall Precaution Comments: watch 02 Restrictions Weight Bearing Restrictions: No      Mobility Bed Mobility               General bed mobility comments: up in recliner    Transfers Overall transfer level: Needs assistance Equipment used: Rolling walker (2 wheeled) Transfers: Sit to/from Stand Sit to Stand: Min guard         General transfer comment: min guard for safety to/from recliner. Cues for technique with rw.    Balance Overall balance assessment: Needs assistance Sitting-balance support: Feet supported;No upper extremity supported Sitting balance-Leahy Scale: Good     Standing balance support: During functional activity Standing balance-Leahy Scale: Poor Standing balance comment: Requires UE support for standing.                            ADL either performed or assessed with clinical judgement   ADL Overall ADL's : Needs assistance/impaired Eating/Feeding: Set up;Sitting   Grooming: Min guard;Standing;Oral care;Wash/dry hands   Upper Body Bathing: Min guard;Standing Upper Body Bathing Details (indicate cue type and reason): washed armpits only. standing position, leaning into sink for support Lower Body Bathing: Min guard;Sit to/from stand   Upper Body Dressing : Set up;Sitting   Lower Body Dressing: Min guard;Sit to/from stand   Toilet Transfer: Min guard;Ambulation;RW   Toileting- Clothing Manipulation and Hygiene: Set up;Min guard;Sitting/lateral lean;Sit to/from stand   Tub/ Shower Transfer: Tub transfer;Min guard;Ambulation;Rolling walker;Shower seat;Grab bars   Functional mobility during ADLs: Min guard;Rolling walker General ADL Comments: Pt completed in room mobility, stood at sink to complete 2 grooming tasks and quick UB washup. Leaning into sink for external support but able to stand for all tasks at sink. rw utilized for transfers and Psychiatrist      Pertinent Vitals/Pain Pain Assessment: No/denies pain     Hand Dominance Right   Extremity/Trunk Assessment Upper Extremity Assessment Upper Extremity Assessment: Generalized weakness;Overall [facility] for tasks assessed   Lower Extremity Assessment Lower Extremity Assessment: Defer to PT evaluation   Cervical / Trunk Assessment Cervical / Trunk Assessment: Kyphotic   Communication Communication Communication: HOH   Cognition Arousal/Alertness: Awake/alert Behavior During Therapy: WFL for tasks assessed/performed Overall Cognitive Status: Within Functional Limits for tasks assessed  General Comments: HOH   General Comments  Daughter present during session.    Exercises     Shoulder Instructions      Home Living Family/patient expects to be  discharged to:: Private residence Living Arrangements: Children Available Help at Discharge: Family;Available PRN/intermittently Type of Home: House Home Access: Stairs to enter Entrance Stairs-Number of Steps: 1   Home Layout: Two level;Bed/bath upstairs Alternate Level Stairs-Number of Steps: full flight to access bed room   Bathroom Shower/Tub: Teacher, early years/pre: Standard     Home Equipment: Environmental consultant - 4 wheels;Shower seat;Bedside commode;Grab bars - tub/shower          Prior Functioning/Environment Level of Independence: Independent                 OT Problem List: Decreased strength;Impaired balance (sitting and/or standing);Decreased knowledge of use of DME or AE;Decreased knowledge of precautions;Cardiopulmonary status limiting activity      OT Treatment/Interventions: Self-care/ADL training;Therapeutic exercise;Energy conservation;DME and/or AE instruction;Therapeutic activities;Patient/family education;Balance training    OT Goals(Current goals can be found in the care plan section) Acute Rehab OT Goals Patient Stated Goal: home OT Goal Formulation: With patient/family Time For Goal Achievement: 01/12/20 Potential to Achieve Goals: Good ADL Goals Pt Will Perform Lower Body Bathing: with modified independence;sit to/from stand Pt Will Perform Lower Body Dressing: with modified independence;sit to/from stand Pt Will Transfer to Toilet: with modified independence;ambulating Pt Will Perform Tub/Shower Transfer: Tub transfer;with supervision;ambulating;shower seat;rolling walker;grab bars  OT Frequency: Min 2X/week   Barriers to D/C:            Co-evaluation              AM-PAC OT "6 Clicks" Daily Activity     Outcome Measure Help from another person eating meals?: None Help from another person taking care of personal grooming?: None Help from another person toileting, which includes using toliet, bedpan, or urinal?: None Help from  another person bathing (including washing, rinsing, drying)?: A Little Help from another person to put on and taking off regular upper body clothing?: None Help from another person to put on and taking off regular lower body clothing?: A Little 6 Click Score: 22   End of Session Equipment Utilized During Treatment: Rolling walker;Oxygen (1.5L)  Activity Tolerance: Patient tolerated treatment well Patient left: in chair;with call bell/phone within reach;with family/visitor present  OT Visit Diagnosis: Unsteadiness on feet (R26.81);Muscle weakness (generalized) (M62.81)                Time: 1235-1310 OT Time Calculation (min): 35 min Charges:  OT General Charges $OT Visit: 1 Visit OT Evaluation $OT Eval Low Complexity: 1 Low OT Treatments $Self Care/Home Management : 8-22 mins  Tyrone Schimke, OT Acute Rehabilitation Services Pager: [phone] Office: [phone]   Hortencia Pilar 12/29/2019, 2:09 PM

## 2019-12-29 NOTE — Discharge Summary (Signed)
Physician Discharge Summary  Cindy Brandt XLK:[id number] DOB: 30-Jul-1931 DOA: 12/23/2019  PCP: [name], NP  Admit date: 12/23/2019 Discharge date: 12/29/2019  Time spent: 45 minutes  Recommendations for Outpatient Follow-up:  Patient will be discharged to home with home health services (physical and occupational therapy, nursing).  Patient will need to follow up with primary care provider within one week of discharge.  Follow-up with Dr. Gwenlyn Found, cardiology.  Patient should continue medications as prescribed.  Patient should follow a heart healthy diet.   Discharge Diagnoses:  Severe sepsis/acute respiratory failure with hypoxia secondary to multifocal bilateral pneumonia Pleuritic chest pain/CAD Chronic diastolic heart failure Hyperlipidemia GERD Deconditioning  Discharge Condition: Stable  Diet recommendation: Heart healthy  Filed Weights   12/27/19 0012 12/28/19 0300 12/29/19 0320  Weight: 63.2 kg 63.9 kg 63.1 kg    History of present illness:  on 12/23/2019 by Dr. Inda Merlin 86 year old female with past medical history of coronary artery disease(NSTEMI in 2018, multivessel disease, last cath 10/2017),hypertension, hyperlipidemia, diastolic congestive heart failure(Echo 05/2016 EF 60-65%),prediabetes and gastroesophageal reflux disease who presents to [facility] emergency department via EMS due to complaints of cough and chest discomfort.  Patient is a rather poor historian and the majority the history has been obtained from the daughter who is at the bedside at time of interview.  The patient has been experiencing symptoms of cough and "congestion" for approximately8weeks, since early August. Patient's cough has been intermittently productive with thick white mucus. Patient's symptoms have been gradually worsening over the span of time. Patient presented to a [place] urgent care clinic in August and at that time was placed on a course of  doxycycline and prednisone without clinical improvement. Patient has been following with her primary care provider since and due to persisting symptoms of cough with progressively worsening shortness of breath a referral to pulmonology was placed during the 10/15 appointment.  Patient's daughter explains that in the past 3 or 4 days the patient has exhibited progressively worsening lethargy and weakness. This is worsened to the point where the patient hardly ever is out of bed due to exhaustion. Patient shortness of breath is continue to worsen as well and is severe in intensity with minimal exertion. Patient denies paroxysmal nocturnal dyspnea or pillow orthopnea. Patient denies lower extremity edema.  Upon further questioning patient daughter denies any recent travel, sick contacts or confirmed contact with known COVID-19 infection.  Patient is also been experiencing episodes of chest discomfort. Patient describes this pain as pressure-like in quality, midsternal and typically occurring with deep inspiration or cough. Patient does report that this pain does seem to improve with as needed nitroglycerin however.  Due to progressively worsening symptoms of shortness of breath cough and chest discomfort the patient eventually presented via EMS to [facility] emergency department. Due to patient's history, and due to observations by EMS of an intermittent left bundle branch block there was initially concern for ACS. However, shortly after arrival and review of the case with cardiology it was felt the patient was not suffering from acute coronary syndrome.   Upon evaluation in the emergency department patient has been found to be quite hypoxic requiring high flow nasal cannula. Patient was also found to be febrile upon arrival at 101.4 F. Because of patient's persisting hypoxia and fever with ongoing complaints of chest discomfort hospitalist group has been called to assess the  patient for admission to the hospital.  Hospital Course:  Severe sepsis/acute respiratory failure  with hypoxia secondary to multifocal bilateral pneumonia -On admission, patient presented with fever, leukocytosis -Found to be hypoxic and requiring 7 L high flow nasal cannula on admission.  She has been weaned to 2 L of oxygen, only with ambulation. -CT chest showed bilateral lower lobe infiltrates however negative for PE -Completed antibiotics during hospitalization -Blood cultures from 12/23/2019 showed GPC, Staph hominis, likely contaminant -Patient did have shortness of breath early morning of 12/26/2019.  She was then started on steroids along with Pulmicort -Will discharge with prednisone taper along with supplemental oxygen to be used with ambulation -Oxygenation was 91% on room air, however dropped to 87% while ambulating.  Patient was placed on 2 L of oxygen while ambulating with oxygen saturations at 94%.  Pleuritic chest pain/CAD -Likely secondary to pneumonia -Denies current chest pain -Continue Plavix, Imdur, Ranexa and aspirin, atenolol -Cardiology consulted and felt the patient's pain was likely secondary to her pneumonia and can follow-up as an outpatient -Overnight, patient had another episode of chest pain along with shortness of breath following coughing spell.  He was given Tylenol, albuterol and nitroglycerin.  She had another episode of chest pain during coughing and was given nitroglycerin which helped relieve her pain.  EKG was obtained and unremarkable for acute findings. -Discussed with Dr. Debara Pickett, cardiology, continue current Imdur and Ranexa with outpatient follow-up.  No ischemic changes noted on EKG.  Patient does have CTO's with collaterals, if she continued to have ischemic pain she would need intervention which would require special preparation not likely to occur during this admission.  Feel pain is likely pleuritic in nature.  Chronic diastolic heart  failure -Appears to be euvolemic and compensated -Echocardiogram in 2018 showed an EF of 60 to 74%, grade 2 diastolic dysfunction -Monitor intake and output, daily weights -Continue atenolol, Imdur  Hyperlipidemia -Continue statin  GERD -Without esophagitis -Continue PPI, H2 blocker  Deconditioning -PT consulted recommended home health versus supervision/assistance 24 hours -OT recommending home health  Consultants Cardiology  Procedures  None  Discharge Exam: Vitals:   12/29/19 0900 12/29/19 0920  BP:  (!) 125/94  Pulse: 78 78  Resp: 18 19  Temp:    SpO2:  99%   Exam  General: Well developed, well nourished, NAD, appears stated age  HEENT: NCAT, mucous membranes moist.   Cardiovascular: S1 S2 auscultated, RRR  Respiratory: Diminished however clear  Abdomen: Soft, nontender, nondistended, + bowel sounds  Extremities: warm dry without cyanosis clubbing or edema  Neuro: AAOx3, hard of hearing otherwise nonfocal  Psych: appropriate mood and affect, pleasant  Discharge Instructions Discharge Instructions    Diet - low sodium heart healthy   Complete by: As directed    Discharge instructions   Complete by: As directed    Patient will be discharged to home with home health services (physical and occupational therapy, nursing).  Patient will need to follow up with primary care provider within one week of discharge.  Follow-up with Dr. Gwenlyn Found, cardiology.  Patient should continue medications as prescribed.  Patient should follow a heart healthy diet. Use oxygen with ambulation.   Increase activity slowly   Complete by: As directed      Allergies as of 12/29/2019      Reactions   Ranexa [ranolazine] Other (See Comments)   Does NOT "agree" with the patient- does not feel like herself      Medication List    TAKE these medications   ProAir HFA 108 (90 Base) MCG/ACT inhaler Generic drug: albuterol  Inhale 2 puffs into the lungs every 6 (six) hours as  needed for wheezing or shortness of breath.   albuterol 108 (90 Base) MCG/ACT inhaler Commonly known as: VENTOLIN HFA Inhale 2 puffs into the lungs every 4 (four) hours as needed for wheezing or shortness of breath.   amLODipine 2.5 MG tablet Commonly known as: NORVASC TAKE 1 TABLET BY MOUTH EVERY DAY   aspirin EC 81 MG tablet Take 81 mg by mouth every morning.   atenolol 25 MG tablet Commonly known as: TENORMIN Take 1 tablet (25 mg total) by mouth daily.   atorvastatin 80 MG tablet Commonly known as: LIPITOR TAKE 1 TABLET BY MOUTH EVERY DAY IN THE EVENING FOR CHOLESTEROL What changed: See the new instructions.   busPIRone 7.5 MG tablet Commonly known as: BUSPAR Take 1 tablet (7.5 mg total) by mouth 2 (two) times daily.   Calcium 600 600 MG Tabs tablet Generic drug: calcium carbonate Take 1,200 mg by mouth daily.   cetirizine 10 MG tablet Commonly known as: ZYRTEC Take 10 mg by mouth daily.   clopidogrel 75 MG tablet Commonly known as: PLAVIX TAKE 1 TABLET BY MOUTH EVERY DAY What changed: when to take this   famotidine 20 MG tablet Commonly known as: PEPCID Take 20 mg by mouth daily.   gabapentin 100 MG capsule Commonly known as: NEURONTIN TAKE 1 CAPSULE BY MOUTH TWICE DAILY FOR PAIN. What changed: See the new instructions.   isosorbide mononitrate 120 MG 24 hr tablet Commonly known as: IMDUR Take 60 mg by mouth in the morning.   melatonin 5 MG Tabs Take 5 mg by mouth at bedtime as needed (sleep).   nitroGLYCERIN 0.4 MG SL tablet Commonly known as: NITROSTAT PLACE 1 TABLET UNDER THE TONGUE EVERY 5 MIN AS NEEDED FOR CHEST PAIN What changed: See the new instructions.   pantoprazole 40 MG tablet Commonly known as: PROTONIX TAKE 1 TABLET BY MOUTH ONCE DAILY FOR HEARTBURN. What changed: See the new instructions.   predniSONE 10 MG tablet Commonly known as: DELTASONE Take 4 tablets (40 mg total) by mouth daily for 1 day, THEN 3 tablets (30 mg total) daily  for 1 day, THEN 2 tablets (20 mg total) daily for 1 day, THEN 1 tablet (10 mg total) daily for 1 day. Start taking on: December 29, 2019   ranolazine 500 MG 12 hr tablet Commonly known as: Ranexa Take 1 tablet (500 mg total) by mouth 2 (two) times daily.   sertraline 100 MG tablet Commonly known as: ZOLOFT TAKE 1 TABLET BY MOUTH ONCE DAILY FOR DEPRESSION/ANXIETY. What changed: See the new instructions.   Spiriva HandiHaler 18 MCG inhalation capsule Generic drug: tiotropium Place 1 capsule (18 mcg total) into inhaler and inhale daily.   spironolactone 25 MG tablet Commonly known as: ALDACTONE Take 0.5 tablets (12.5 mg total) by mouth daily.            Durable Medical Equipment  (From admission, onward)         Start     Ordered   12/29/19 1450  For home use only DME 3 n 1  Once        12/29/19 1449   12/29/19 1440  For home use only DME oxygen  Once       Question Answer Comment  Length of Need 6 Months   Mode or (Route) Nasal cannula   Liters per Minute 2   Frequency Continuous (stationary and portable oxygen unit needed)   Oxygen  conserving device Yes   Oxygen delivery system Gas      12/29/19 1439         Allergies  Allergen Reactions  . Ranexa [Ranolazine] Other (See Comments)    Does NOT "agree" with the patient- does not feel like herself    Follow-up Information    [name], NP. Schedule an appointment as soon as possible for a visit in 1 week(s).   Specialty: Internal Medicine Why: Hospital follow up Contact information: [place] [address] [phone]        Lorretta Harp, MD .   Specialties: Cardiology, Radiology Contact information: [address] [place] [address] [phone]                The results of significant diagnostics from this hospitalization (including imaging, microbiology, ancillary and laboratory) are listed below for reference.    Significant Diagnostic  Studies: CT Angio Chest PE W and/or Wo Contrast  Result Date: 12/23/2019 CLINICAL DATA:  Positive D-dimer EXAM: CT ANGIOGRAPHY CHEST WITH CONTRAST TECHNIQUE: Multidetector CT imaging of the chest was performed using the standard protocol during bolus administration of intravenous contrast. Multiplanar CT image reconstructions and MIPs were obtained to evaluate the vascular anatomy. CONTRAST:  24mL OMNIPAQUE IOHEXOL 350 MG/ML SOLN COMPARISON:  Chest x-ray from earlier in the same day. FINDINGS: Cardiovascular: Atherosclerotic calcifications of the thoracic aorta are noted without aneurysmal dilatation. The degree of opacification is limited. The pulmonary artery shows a normal branching pattern bilaterally. No filling defects to suggest pulmonary emboli are identified. Coronary calcifications are seen. No cardiac enlargement is noted. Mediastinum/Nodes: Thoracic inlet is within normal limits. The esophagus as visualized is within normal limits. No mediastinal adenopathy is noted. Small hilar nodes are noted particularly on the right likely reactive in nature Lungs/Pleura: Mild bibasilar atelectatic changes are noted. No sizable effusion is seen. No sizable pulmonary nodules are noted. Upper Abdomen: No acute abnormality. Musculoskeletal: Degenerative changes of the thoracic spine are seen. No acute rib abnormality is noted. Review of the MIP images confirms the above findings. IMPRESSION: No evidence of pulmonary emboli. Mild bibasilar atelectasis. Aortic Atherosclerosis (ICD10-I70.0). Electronically Signed   By: Inez Catalina M.D.   On: 12/23/2019 19:55   DG CHEST PORT 1 VIEW  Result Date: 12/25/2019 CLINICAL DATA:  Chest pain and shortness of breath. EXAM: PORTABLE CHEST 1 VIEW COMPARISON:  Radiograph and CT 12/23/2019 FINDINGS: Stable mild cardiomegaly. Unchanged mediastinal contours. Aortic atherosclerosis. Chronic bronchial thickening with mild emphysema on CT. Slight increase in streaky opacities in the  right perihilar lung. Left lower lobe airspace disease is similar to CT. No pulmonary edema or pneumothorax. No large pleural effusion. Stable osseous structures. IMPRESSION: 1. Slight increase in streaky right perihilar opacities, favoring atelectasis. 2. Stable left lower lobe airspace disease/atelectasis. 3. Stable mild cardiomegaly. Electronically Signed   By: Keith Rake M.D.   On: 12/25/2019 23:29   DG Chest Portable 1 View  Result Date: 12/23/2019 CLINICAL DATA:  Hypoxia, short of breath EXAM: PORTABLE CHEST 1 VIEW COMPARISON:  10/27/2019 FINDINGS: Stable enlarged cardiac silhouette. Ectatic aorta. No effusion, infiltrate or pneumothorax. Chronic bronchitic markings. Hyperinflated lungs. IMPRESSION: 1. No acute findings. 2. Chronic bronchitic markings. 3. Ectatic calcified aorta. 4. Hyperinflated lungs. Electronically Signed   By: Suzy Bouchard M.D.   On: 12/23/2019 18:33    Microbiology: Recent Results (from the past 240 hour(s))  Respiratory Panel by RT PCR (Flu A&B, Covid) - Nasopharyngeal Swab  Status: None   Collection Time: 12/23/19  5:29 PM   Specimen: Nasopharyngeal Swab  Result Value Ref Range Status   SARS Coronavirus 2 by RT PCR NEGATIVE NEGATIVE Final    Comment: (NOTE) SARS-CoV-2 target nucleic acids are NOT DETECTED.  The SARS-CoV-2 RNA is generally detectable in upper respiratoy specimens during the acute phase of infection. The lowest concentration of SARS-CoV-2 viral copies this assay can detect is 131 copies/mL. A negative result does not preclude SARS-Cov-2 infection and should not be used as the sole basis for treatment or other patient management decisions. A negative result may occur with  improper specimen collection/handling, submission of specimen other than nasopharyngeal swab, presence of viral mutation(s) within the areas targeted by this assay, and inadequate number of viral copies (<131 copies/mL). A negative result must be combined with  clinical observations, patient history, and epidemiological information. The expected result is Negative.  Fact Sheet for Patients:  PinkCheek.be  Fact Sheet for Healthcare Providers:  GravelBags.it  This test is no t yet approved or cleared by the [place] FDA and  has been authorized for detection and/or diagnosis of SARS-CoV-2 by FDA under an Emergency Use Authorization (EUA). This EUA will remain  in effect (meaning this test can be used) for the duration of the COVID-19 declaration under Section 564(b)(1) of the Act, 21 U.S.C. section 360bbb-3(b)(1), unless the authorization is terminated or revoked sooner.     Influenza A by PCR NEGATIVE NEGATIVE Final   Influenza B by PCR NEGATIVE NEGATIVE Final    Comment: (NOTE) The Xpert Xpress SARS-CoV-2/FLU/RSV assay is intended as an aid in  the diagnosis of influenza from Nasopharyngeal swab specimens and  should not be used as a sole basis for treatment. Nasal washings and  aspirates are unacceptable for Xpert Xpress SARS-CoV-2/FLU/RSV  testing.  Fact Sheet for Patients: PinkCheek.be  Fact Sheet for Healthcare Providers: GravelBags.it  This test is not yet approved or cleared by the [place] FDA and  has been authorized for detection and/or diagnosis of SARS-CoV-2 by  FDA under an Emergency Use Authorization (EUA). This EUA will remain  in effect (meaning this test can be used) for the duration of the  Covid-19 declaration under Section 564(b)(1) of the Act, 21  U.S.C. section 360bbb-3(b)(1), unless the authorization is  terminated or revoked. Performed at [facility], [place] [address], [place], [place] [address]   Blood culture (routine x 2)     Status: Abnormal   Collection Time: 12/23/19  6:04 PM   Specimen: BLOOD  Result Value Ref Range Status   Specimen Description BLOOD LEFT ANTECUBITAL   Final   Special Requests   Final    BOTTLES DRAWN AEROBIC AND ANAEROBIC Blood Culture results may not be optimal due to an inadequate volume of blood received in culture bottles   Culture  Setup Time   Final    GRAM POSITIVE COCCI IN CLUSTERS AEROBIC BOTTLE ONLY CRITICAL RESULT CALLED TO, READ BACK BY AND VERIFIED WITH: K. [place], AT 3419 12/24/19 BY D. VANHOOK    Culture (A)  Final    STAPHYLOCOCCUS HOMINIS THE SIGNIFICANCE OF ISOLATING THIS ORGANISM FROM A SINGLE SET OF BLOOD CULTURES WHEN MULTIPLE SETS ARE DRAWN IS UNCERTAIN. PLEASE NOTIFY THE MICROBIOLOGY DEPARTMENT WITHIN ONE WEEK IF SPECIATION AND SENSITIVITIES ARE REQUIRED. Performed at [facility], [place] [address], [place], [place] [address]    Report Status 12/26/2019 FINAL  Final  Blood Culture ID Panel (Reflexed)  Status: Abnormal   Collection Time: 12/23/19  6:04 PM  Result Value Ref Range Status   Enterococcus faecalis NOT DETECTED NOT DETECTED Final   Enterococcus Faecium NOT DETECTED NOT DETECTED Final   Listeria monocytogenes NOT DETECTED NOT DETECTED Final   Staphylococcus species DETECTED (A) NOT DETECTED Final    Comment: CRITICAL RESULT CALLED TO, READ BACK BY AND VERIFIED WITH: K. HURTH PHARMD, AT 1617 12/24/19 BY D. VANHOOK    Staphylococcus aureus (BCID) NOT DETECTED NOT DETECTED Final   Staphylococcus epidermidis NOT DETECTED NOT DETECTED Final   Staphylococcus lugdunensis NOT DETECTED NOT DETECTED Final   Streptococcus species NOT DETECTED NOT DETECTED Final   Streptococcus agalactiae NOT DETECTED NOT DETECTED Final   Streptococcus pneumoniae NOT DETECTED NOT DETECTED Final   Streptococcus pyogenes NOT DETECTED NOT DETECTED Final   A.calcoaceticus-baumannii NOT DETECTED NOT DETECTED Final   Bacteroides fragilis NOT DETECTED NOT DETECTED Final   Enterobacterales NOT DETECTED NOT DETECTED Final   Enterobacter cloacae complex NOT DETECTED NOT DETECTED Final   Escherichia coli NOT DETECTED NOT  DETECTED Final   Klebsiella aerogenes NOT DETECTED NOT DETECTED Final   Klebsiella oxytoca NOT DETECTED NOT DETECTED Final   Klebsiella pneumoniae NOT DETECTED NOT DETECTED Final   Proteus species NOT DETECTED NOT DETECTED Final   Salmonella species NOT DETECTED NOT DETECTED Final   Serratia marcescens NOT DETECTED NOT DETECTED Final   Haemophilus influenzae NOT DETECTED NOT DETECTED Final   Neisseria meningitidis NOT DETECTED NOT DETECTED Final   Pseudomonas aeruginosa NOT DETECTED NOT DETECTED Final   Stenotrophomonas maltophilia NOT DETECTED NOT DETECTED Final   Candida albicans NOT DETECTED NOT DETECTED Final   Candida auris NOT DETECTED NOT DETECTED Final   Candida glabrata NOT DETECTED NOT DETECTED Final   Candida krusei NOT DETECTED NOT DETECTED Final   Candida parapsilosis NOT DETECTED NOT DETECTED Final   Candida tropicalis NOT DETECTED NOT DETECTED Final   Cryptococcus neoformans/gattii NOT DETECTED NOT DETECTED Final    Comment: Performed at [facility], [address] [place], [place] [address]  Blood culture (routine x 2)     Status: None   Collection Time: 12/23/19  6:08 PM   Specimen: BLOOD  Result Value Ref Range Status   Specimen Description BLOOD SITE NOT SPECIFIED  Final   Special Requests   Final    BOTTLES DRAWN AEROBIC AND ANAEROBIC Blood Culture adequate volume   Culture   Final    NO GROWTH 5 DAYS Performed at [facility], [address] [place], [place] [address]    Report Status 12/28/2019 FINAL  Final  Urine culture     Status: None   Collection Time: 12/23/19 10:27 PM   Specimen: Urine, Random  Result Value Ref Range Status   Specimen Description URINE, RANDOM  Final   Special Requests NONE  Final   Culture   Final    NO GROWTH Performed at [facility], [place] [address] [place], [place] [address]    Report Status 12/25/2019 FINAL  Final     Labs: Basic Metabolic Panel: Recent Labs  Lab 12/23/19 1804 12/23/19 2257  12/24/19 0441 12/25/19 1121  NA 135 136 134* 136  K 4.6 4.3 3.5 4.1  CL 99  --  100 101  CO2 24  --  24 26  GLUCOSE 161*  --  135* 205*  BUN 17  --  20 24*  CREATININE 0.93  --  0.89 1.02*  CALCIUM 9.5  --  8.8* 8.5*  MG  --   --  1.8  --    Liver Function Tests: Recent Labs  Lab 12/23/19 1804 12/24/19 0441  AST 22 19  ALT 15 14  ALKPHOS 57 54  BILITOT 1.1 0.7  PROT 7.6 7.0  ALBUMIN 3.4* 2.8*   Recent Labs  Lab 12/23/19 1804  LIPASE 17   No results for input(s): AMMONIA in the last 168 hours. CBC: Recent Labs  Lab 12/23/19 1844 12/23/19 2257 12/24/19 0441 12/25/19 1121  WBC 10.7*  --  10.2 5.7  NEUTROABS 9.7*  --  8.1* 4.2  HGB 11.9* 11.2* 11.1* 10.7*  HCT 36.6 33.0* 34.4* 32.9*  MCV 104.9*  --  106.2* 107.2*  PLT 223  --  227 263   Cardiac Enzymes: No results for input(s): CKTOTAL, CKMB, CKMBINDEX, TROPONINI in the last 168 hours. BNP: BNP (last 3 results) Recent Labs    12/23/19 1844 12/25/19 2352  BNP 347.1* 96.1    ProBNP (last 3 results) Recent Labs    10/27/19 1123  PROBNP 97.0    CBG: No results for input(s): GLUCAP in the last 168 hours.     Signed:  Cristal Ford  [facility] 12/29/2019, 2:50 PM

## 2019-12-29 NOTE — Discharge Instructions (Signed)
Hypoxia Hypoxia is a condition that happens when there is a lack of oxygen in the body's tissues and organs. When there is not enough oxygen, organs cannot work as they should. This causes serious problems throughout the body and in the brain. What are the causes? This condition may be caused by:  Exposure to high altitude.  A collapsed lung (pneumothorax).  Lung infection (pneumonia).  Lung injury.  Long-term (chronic) lung disease, such as COPD (chronic obstructive pulmonary disease).  Blood collecting in the chest cavity (hemothorax).  Food, saliva, or vomit getting into the airway (aspiration).  Reduced blood flow (ischemia).  Severe blood loss.  Slow or shallow breathing (hypoventilation).  Blood disorders, such as anemia.  Carbon monoxide poisoning.  The heart suddenly stopping (cardiac arrest).  Anesthetic medicines.  Drowning.  Choking. What are the signs or symptoms? Symptoms of this condition include:  Headache.  Fatigue.  Drowsiness.  Forgetfulness.  Nausea.  Confusion.  Shortness of breath.  Dizziness.  Bluish color of the skin, lips, or nail beds (cyanosis).  Change in consciousness or awareness. If hypoxia is not treated, it can lead to convulsions, loss of consciousness (coma), or brain damage. How is this diagnosed? This condition may be diagnosed based on:  A physical exam.  Blood tests.  A test that measures how much oxygen is in your blood (pulse oximetry). This is done with a sensor that is placed on your finger, toe, or earlobe.  Chest X-ray.  Tests to check your lung function (pulmonary function tests).  A test to check the electrical activity of your heart (electrocardiogram, ECG). You may have other tests to determine the cause of your hypoxia. How is this treated?  Treatment for this condition depends on what is causing the hypoxia. You will likely be treated with oxygen therapy. This may be done by giving you oxygen  through a face mask or through tubes in your nose. Your health care provider may also recommend other therapies to treat the underlying cause of your hypoxia. Follow these instructions at home:  Take over-the-counter and prescription medicines only as told by your health care provider.  Do not use any products that contain nicotine or tobacco, such as cigarettes and e-cigarettes. If you need help quitting, ask your health care provider.  Avoid secondhand smoke.  Work with your health care provider to manage any chronic conditions you have that may be causing hypoxia, such as COPD.  Keep all follow-up visits as told by your health care provider. This is important. Contact a health care provider if:  You have a fever.  You have trouble breathing, even after treatment.  You become extremely short of breath when you exercise. Get help right away if:  Your shortness of breath gets worse, especially with normal or very little activity.  Your skin, lips, or nail beds have a bluish color.  You become confused or you cannot think properly.  You have chest pain. Summary  Hypoxia is a condition that happens when there is a lack of oxygen in the body's tissues and organs.  If hypoxia is not treated, it can lead to convulsions, loss of consciousness (coma), or brain damage.  Symptoms of hypoxia can include a headache, shortness of breath, confusion, nausea, and a bluish skin color.  Hypoxia has many possible causes, including exposure to high altitude, carbon monoxide poisoning, or other health issues, such as blood disorders or cardiac arrest.  Hypoxia is usually treated with oxygen therapy. This information is not   intended to replace advice given to you by your health care provider. Make sure you discuss any questions you have with your health care provider. Document Revised: 01/29/2017 Document Reviewed: 04/06/2016 Elsevier Patient Education  [place]  Pneumonia, Adult Pneumonia is an infection of the lungs. It causes swelling in the airways of the lungs. Mucus and fluid may also build up inside the airways. One type of pneumonia can happen while a person is in a hospital. A different type can happen when a person is not in a hospital (community-acquired pneumonia).  What are the causes?  This condition is caused by germs (viruses, bacteria, or fungi). Some types of germs can be passed from one person to another. This can happen when you breathe in droplets from the cough or sneeze of an infected person. What increases the risk? You are more likely to develop this condition if you:  Have a long-term (chronic) disease, such as: ? Chronic obstructive pulmonary disease (COPD). ? Asthma. ? Cystic fibrosis. ? Congestive heart failure. ? Diabetes. ? Kidney disease.  Have HIV.  Have sickle cell disease.  Have had your spleen removed.  Do not take good care of your teeth and mouth (poor dental hygiene).  Have a medical condition that increases the risk of breathing in droplets from your own mouth and nose.  Have a weakened body defense system (immune system).  Are a smoker.  Travel to areas where the germs that cause this illness are common.  Are around certain animals or the places they live. What are the signs or symptoms?  A dry cough.  A wet (productive) cough.  Fever.  Sweating.  Chest pain. This often happens when breathing deeply or coughing.  Fast breathing or trouble breathing.  Shortness of breath.  Shaking chills.  Feeling tired (fatigue).  Muscle aches. How is this treated? Treatment for this condition depends on many things. Most adults can be treated at home. In some cases, treatment must happen in a hospital. Treatment may include:  Medicines given by mouth or through an IV tube.  Being given extra oxygen.  Respiratory therapy. In rare cases, treatment for very bad pneumonia may  include:  Using a machine to help you breathe.  Having a procedure to remove fluid from around your lungs. Follow these instructions at home: Medicines  Take over-the-counter and prescription medicines only as told by your doctor. ? Only take cough medicine if you are losing sleep.  If you were prescribed an antibiotic medicine, take it as told by your doctor. Do not stop taking the antibiotic even if you start to feel better. General instructions   Sleep with your head and neck raised (elevated). You can do this by sleeping in a recliner or by putting a few pillows under your head.  Rest as needed. Get at least 8 hours of sleep each night.  Drink enough water to keep your pee (urine) pale yellow.  Eat a healthy diet that includes plenty of vegetables, fruits, whole grains, low-fat dairy products, and lean protein.  Do not use any products that contain nicotine or tobacco. These include cigarettes, e-cigarettes, and chewing tobacco. If you need help quitting, ask your doctor.  Keep all follow-up visits as told by your doctor. This is important. How is this prevented? A shot (vaccine) can help prevent pneumonia. Shots are often suggested for:  People older than 84 years of age.  People older than 84 years of age who: ? Are having  cancer treatment. ? Have long-term (chronic) lung disease. ? Have problems with their body's defense system. You may also prevent pneumonia if you take these actions:  Get the flu (influenza) shot every year.  Go to the dentist as often as told.  Wash your hands often. If you cannot use soap and water, use hand sanitizer. Contact a doctor if:  You have a fever.  You lose sleep because your cough medicine does not help. Get help right away if:  You are short of breath and it gets worse.  You have more chest pain.  Your sickness gets worse. This is very serious if: ? You are an older adult. ? Your body's defense system is weak.  You  cough up blood. Summary  Pneumonia is an infection of the lungs.  Most adults can be treated at home. Some will need treatment in a hospital.  Drink enough water to keep your pee pale yellow.  Get at least 8 hours of sleep each night. This information is not intended to replace advice given to you by your health care provider. Make sure you discuss any questions you have with your health care provider. Document Revised: 06/08/2018 Document Reviewed: 10/14/2017 Elsevier Patient Education  [place].

## 2019-12-29 NOTE — TOC Transition Note (Signed)
Transition of Care [facility]) - CM/SW Discharge Note   Patient Details  Name: Cindy Brandt MRN: [id number] Date of Birth: 16-Dec-1931  [facility]) CM/SW Contact:  Zenon Mayo, RN Phone Number: 12/29/2019, 3:05 PM   Clinical Narrative:    Patient is for dc today, NCM offered choice, [place] , Kennyth Lose has no preference, NCM maed referral to [facility] with [place], he is able to take referral for [place], [place], [place].  Soc will begin mid next week.  She will also need home oxygen.  NCM made referral to [facility] with [facility].  The oxygen will be brought up to patient room.   Final next level of care: [place] Barriers to Discharge: No Barriers Identified   Patient Goals and CMS Choice Patient states their goals for this hospitalization and ongoing recovery are:: get better CMS Medicare.gov Compare Post Acute Care list provided to:: Patient Represenative (must comment) Choice offered to / list presented to : Adult Children  Discharge Placement                       Discharge Plan and Services                DME Arranged: Oxygen DME Agency: [employer] Date DME Agency Contacted: 12/29/19 Time DME Agency Contacted: [phone] Representative spoke with at DME Agency: [place]: PT, RN, OT [facility] Agency: [place] Date [place]: 12/29/19 Time [place]: 1281 Representative spoke with at [place]: [place] ([place]) Interventions     Readmission Risk Interventions No flowsheet data found.

## 2019-12-29 NOTE — Progress Notes (Signed)
Spoke to daughter Dewaine Oats and went over discharge paperwork with her. Yvette verbalizes understanding. Transport has been called and is en route. Patient is getting dressed currently. IV has been removed earlier and tele monitor has been removed. CCMD has been notified.

## 2019-12-29 NOTE — Progress Notes (Signed)
SATURATION QUALIFICATIONS: (This note is used to comply with regulatory documentation for home oxygen)  Patient Saturations on Room Air at Rest = 91%  Patient Saturations on Room Air while Ambulating = 87%  Patient Saturations on 2 Liters of oxygen while Ambulating = 94%  Please briefly explain why patient needs home oxygen:

## 2019-12-29 NOTE — Plan of Care (Signed)

## 2019-12-29 NOTE — Progress Notes (Signed)
  Mobility Specialist Criteria Algorithm Info.  SATURATION QUALIFICATIONS: (This note is used to comply with regulatory documentation for home oxygen)  Patient Saturations on Room Air at Rest = 94%  Patient Saturations on Room Air while Ambulating = 87%  Patient Saturations on 2 Liters of oxygen while Ambulating = 93%  Please briefly explain why patient needs home oxygen: Pt required 2LO2 to maintain an oxygen saturation >90%  Mobility Team:  HOB elevated: Activity: Ambulated in hall;Transferred:  Bed to chair (Stair training ) Range of motion: Active;All extremities Level of assistance: Standby assist, set-up cues, supervision of patient - no hands on Assistive device: Front wheel walker Minutes sitting in chair:  Minutes stood: 5 minutes Minutes ambulated: 5 minutes Distance ambulated (ft): 100 ft Mobility response: Tolerated well Bed Position: Chair  Pt received in bed eager to participate in mobility with anticipation to be discharged soon. Pt required no hands-on physical assist to get to the EOB and is supervision sit/stand. Required cues for hand placement on RW for ambulation and frequent cues to stand closer to walker. Pt ambulated w/supervision 111f in hallway and performed stair training (step stool) upon returning to room. Oxygen saturation decreased with exertion requiring 2LO2 to maintain a saturation >90%. Pt tolerated ambulation well with no complaints and is now sitting in recliner chair with all needs met.   12/29/2019 10:20 AM

## 2019-12-29 NOTE — Progress Notes (Signed)
Physical Therapy Treatment Patient Details Name: Cindy Brandt MRN: [id number] DOB: [birth year] Today's Date: 12/29/2019    History of Present Illness pt is an 86 y.o. female past medical history of coronary artery disease in 2018 with multivessel involvement with the last cath in 0086, chronic diastolic heart failure, diet-controlled diabetes mellitus comes into the hospital for cough and chest discomfort    PT Comments    Patient progressing well towards PT goals. Improved ambulation distance with Min guard-Min A for balance/safety with use of RW for support. Some difficulty with turns in RW needing cues/assist. Sp02 dropped to 87% on RA with activity; donned 2L/min 02 [place] and able to maintain SP02 >94% with activity.  Will continue to work on weaning 02; decreased to 1L at rest. Encouraged using RW for support initially at home. Will follow.   Follow Up Recommendations  Home health PT;Supervision/Assistance - 24 hour     Equipment Recommendations  3in1 (PT)    Recommendations for Other Services       Precautions / Restrictions Precautions Precautions: Fall;Other (comment) Precaution Comments: watch 02 Restrictions Weight Bearing Restrictions: No    Mobility  Bed Mobility               General bed mobility comments: Up in chair upon PT arrival.  Transfers Overall transfer level: Needs assistance Equipment used: Rolling walker (2 wheeled) Transfers: Sit to/from Stand Sit to Stand: Min guard         General transfer comment: Min guard for safety; cues for hand placement.  Ambulation/Gait Ambulation/Gait assistance: Min assist;Min guard Gait Distance (Feet): 120 Feet Assistive device: Rolling walker (2 wheeled) Gait Pattern/deviations: Step-through pattern;Decreased stride length;Trunk flexed Gait velocity: decreased Gait velocity interpretation: <1.31 ft/sec, indicative of household ambulator General Gait Details: Slow, mildly unsteady gait with RW for  support; some difficulty with turns. Sp02 dropped to 87% on RA, donned 2L and able to maintain >92%. Cues for pursed lip breathing.,   Stairs             Wheelchair Mobility    Modified Rankin (Stroke Patients Only)       Balance Overall balance assessment: Needs assistance Sitting-balance support: Feet supported;No upper extremity supported Sitting balance-Leahy Scale: Good     Standing balance support: During functional activity Standing balance-Leahy Scale: Poor Standing balance comment: Requires UE support for standing.                            Cognition Arousal/Alertness: Awake/alert Behavior During Therapy: WFL for tasks assessed/performed Overall Cognitive Status: Within Functional Limits for tasks assessed                                 General Comments: HOH so needs repetition. Tangential at times as well.      Exercises      General Comments General comments (skin integrity, edema, etc.): Daughter present during session.      Pertinent Vitals/Pain Pain Assessment: No/denies pain    Home Living Family/patient expects to be discharged to:: (P) Private residence Living Arrangements: (P) Children Available Help at Discharge: (P) Family;Available PRN/intermittently Type of Home: (P) House Home Access: (P) Stairs to enter   Home Layout: (P) Two level;Bed/bath upstairs Home Equipment: (P) Walker - 4 wheels;Shower seat;Bedside commode;Grab bars - tub/shower      Prior Function Level of Independence: (P) Independent  PT Goals (current goals can now be found in the care plan section) Progress towards PT goals: Progressing toward goals    Frequency    Min 3X/week      PT Plan Current plan remains appropriate    Co-evaluation              AM-PAC PT "6 Clicks" Mobility   Outcome Measure  Help needed turning from your back to your side while in a flat bed without using bedrails?: None Help needed  moving from lying on your back to sitting on the side of a flat bed without using bedrails?: A Little Help needed moving to and from a bed to a chair (including a wheelchair)?: A Little Help needed standing up from a chair using your arms (e.g., wheelchair or bedside chair)?: A Little Help needed to walk in hospital room?: A Little Help needed climbing 3-5 steps with a railing? : A Lot 6 Click Score: 18    End of Session Equipment Utilized During Treatment: Gait belt Activity Tolerance: Treatment limited secondary to medical complications (Comment) (drop in SP02) Patient left: in chair;with call bell/phone within reach;with chair alarm set;with family/visitor present Nurse Communication: Mobility status PT Visit Diagnosis: Unsteadiness on feet (R26.81);Muscle weakness (generalized) (M62.81);Other abnormalities of gait and mobility (R26.89)     Time: 8341-9622 PT Time Calculation (min) (ACUTE ONLY): 24 min  Charges:  $Gait Training: 8-22 mins $Therapeutic Exercise: 8-22 mins                     Marisa Severin, PT, DPT Acute Rehabilitation Services Pager [phone] Office [place] 12/29/2019, 2:03 PM

## 2019-12-29 NOTE — Progress Notes (Signed)
SATURATION QUALIFICATIONS: (This note is used to comply with regulatory documentation for home oxygen)  Patient Saturations on Room Air at Rest = 91%  Patient Saturations on Room Air while Ambulating = 87%  Patient Saturations on 2 Liters of oxygen while Ambulating = 94%  Please briefly explain why patient needs home oxygen: Pt is not able to maintain oxygen saturations >90% on RA with activity.   Zettie Cooley, DPT Acute Rehabilitation Services Pager [phone] Office [phone]

## 2020-01-01 ENCOUNTER — Telehealth: Payer: Self-pay

## 2020-01-01 NOTE — Telephone Encounter (Signed)
Transition Care Management Unsuccessful Follow-up Telephone Call  Date of discharge and from where:  [facility]29/2019[facility]  Attempts:  2nd Attempt  Reason for unsuccessful TCM follow-up call:  No answer/busy

## 2020-01-01 NOTE — Telephone Encounter (Signed)
Transition Care Management Unsuccessful Follow-up Telephone Call  Date of discharge and from where:  [facility]  Attempts:  1st Attempt  Reason for unsuccessful TCM follow-up call:  Left voice message

## 2020-01-02 NOTE — Telephone Encounter (Signed)
Transition Care Management Unsuccessful Follow-up Telephone Call  Date of discharge and from where:  [facility]/2019[facility]   Attempts:  3rd Attempt  Reason for unsuccessful TCM follow-up call:  Left voice message

## 2020-01-03 DIAGNOSIS — J9601 Acute respiratory failure with hypoxia: Secondary | ICD-10-CM | POA: Diagnosis not present

## 2020-01-03 DIAGNOSIS — I110 Hypertensive heart disease with heart failure: Secondary | ICD-10-CM | POA: Diagnosis not present

## 2020-01-03 DIAGNOSIS — I5032 Chronic diastolic (congestive) heart failure: Secondary | ICD-10-CM | POA: Diagnosis not present

## 2020-01-03 DIAGNOSIS — J439 Emphysema, unspecified: Secondary | ICD-10-CM | POA: Diagnosis not present

## 2020-01-03 NOTE — Telephone Encounter (Signed)
Cindy Brandt, FYI 

## 2020-01-04 ENCOUNTER — Telehealth (INDEPENDENT_AMBULATORY_CARE_PROVIDER_SITE_OTHER): Payer: Medicare Other | Admitting: Primary Care

## 2020-01-04 ENCOUNTER — Encounter: Payer: Self-pay | Admitting: Primary Care

## 2020-01-04 ENCOUNTER — Other Ambulatory Visit: Payer: Self-pay

## 2020-01-04 VITALS — BP 183/86 | Temp 97.8°F | Ht 59.0 in | Wt 144.6 lb

## 2020-01-04 DIAGNOSIS — J9601 Acute respiratory failure with hypoxia: Secondary | ICD-10-CM

## 2020-01-04 DIAGNOSIS — M79672 Pain in left foot: Secondary | ICD-10-CM

## 2020-01-04 DIAGNOSIS — I10 Essential (primary) hypertension: Secondary | ICD-10-CM | POA: Diagnosis not present

## 2020-01-04 DIAGNOSIS — M79671 Pain in right foot: Secondary | ICD-10-CM | POA: Diagnosis not present

## 2020-01-04 DIAGNOSIS — I110 Hypertensive heart disease with heart failure: Secondary | ICD-10-CM | POA: Diagnosis not present

## 2020-01-04 DIAGNOSIS — I5032 Chronic diastolic (congestive) heart failure: Secondary | ICD-10-CM | POA: Diagnosis not present

## 2020-01-04 DIAGNOSIS — M159 Polyosteoarthritis, unspecified: Secondary | ICD-10-CM

## 2020-01-04 DIAGNOSIS — J189 Pneumonia, unspecified organism: Secondary | ICD-10-CM

## 2020-01-04 DIAGNOSIS — J439 Emphysema, unspecified: Secondary | ICD-10-CM | POA: Diagnosis not present

## 2020-01-04 MED ORDER — GABAPENTIN 100 MG PO CAPS: ORAL_CAPSULE | ORAL | 1 refills | Status: DC

## 2020-01-04 NOTE — Assessment & Plan Note (Signed)
Improved with gabapentin.  We will be increasing dose to 100 mg in the morning and 200 mg at night.  She will update.

## 2020-01-04 NOTE — Assessment & Plan Note (Addendum)
Recent admission for bilateral pneumonia causing acute respiratory failure with hypoxia.  She appears much better today, daughter and patient agree.  Continue Incruse daily, then switched to Spiriva daily and continue.  Continue home health PT/OT and nursing.  Her pulse oximetry should arrive in the mail today, daughter will monitor her readings. Use supplemental oxygen as needed.  Prescription provided for Rollator walker.  Hospital notes, labs, imaging reviewed.

## 2020-01-04 NOTE — Assessment & Plan Note (Signed)
Above goal today, but was late with her blood pressure medications  Blood pressure yesterday was "normal" per patient's daughter.  Continue amlodipine 2.5 mg, atenolol 25 mg, spironolactone 25 mg.    Her daughter will closely monitor her blood pressure and update if she notices continued elevated readings.

## 2020-01-04 NOTE — Assessment & Plan Note (Signed)
Overall improved with gabapentin, mostly noticing numbness/tingling to toes.  Increase gabapentin to 200 mg at bedtime, continue 100 in a.m.

## 2020-01-04 NOTE — Progress Notes (Addendum)
Subjective:    Patient ID: Cindy Brandt, female    DOB: 1931-04-27, 86 y.o.   MRN: [id number]  HPI  Virtual Visit via Video Note  I connected with Cindy Brandt on 01/04/20 at 11:20 AM EDT by a video enabled telemedicine application and verified that I am speaking with the correct person using two identifiers.  Location: Patient: Home Provider: Office Participants: Patient, her daughter, myself   I discussed the limitations of evaluation and management by telemedicine and the availability of in person appointments. The patient expressed understanding and agreed to proceed.  History of Present Illness:  Cindy Brandt is a 86 year old female with a history of hypertension, CAD, CHF, GERD, bilateral pneumonia, acute respiratory failure with hypoxia who presents today for hospital follow up.  She presented to [facility] emergency department on 12/29/2019 via EMS for cough, chest discomfort, shortness of breath.  Cough had been ongoing for 8 weeks, had been treated with antibiotics previously, symptoms worse over the prior 3 days. She was admitted for acute respiratory failure with hypoxia secondary to bilateral pneumonia.  During her hospital stay she required supplemental oxygen due to hypoxia and dyspnea.  CT chest showed bilateral pneumonia, negative for pulmonary embolism.  She was treated with IV antibiotics.  Cardiology consulted who felt that her chest pressure was secondary to pneumonia and recommended outpatient follow-up.  She was discharged home on 12/29/2019 with a prescription for prednisone taper, Pulmicort inhaler, supplemental oxygen to use as needed with ambulation, and recommendations for home health PT/OT/nursing.  Since her discharge home she is feeling much better.  She has not had to use her oxygen, also does not have a pulse oximeter but this was ordered and should arrive today.  She was visited by home health occupational and physical therapy yesterday, the nurse should be  coming out within the next several days.  She was actually provided a prescription for Incruse inhaler for which she has been compliant to daily.  She still has a Spiriva that was prescribed during her last office visit.  She has not had to use albuterol recently.  Her main concern today is a right-sided headache and numbness/tingling to plantar feet.  She took her blood pressure medications just prior to her visit today, typically is routine about taking.  The physical therapist checked her blood pressure yesterday, she was told it was "normal".  She is managed on gabapentin 100 mg twice daily for lower extremity pain, joint aches.  She has noticed some improvement to the numbness in her feet while on this medication, but wonders if a dose increase may help more.  She would also like to inquire about obtaining a Rollator walker.  Her current walker is unsuitable as she often has to rest when walking.  She is requesting a walker that has a seat and that is more mobile.  BP Readings from Last 3 Encounters:  01/04/20 (!) 183/86  12/29/19 (!) 125/94  12/15/19 118/64      Observations/Objective:  Alert and oriented. Appears well, not sickly. No distress. Speaking in complete sentences. She coughed only once during visit  Assessment and Plan:  See problem based charting.  Follow Up Instructions:  Continue the Incruse inhaler daily, then use the Spiriva inhaler daily thereafter.  Only use the albuterol (rescue inhaler) if needed for shortness of breath.  Use your oxygen as needed as discussed.  Please update me regarding your blood pressure readings next week as discussed.  We  increased your gabapentin, take 100 mg in the morning and 200 mg at night.  Nice to see you! Allie Bossier, NP-C    I discussed the assessment and treatment plan with the patient. The patient was provided an opportunity to ask questions and all were answered. The patient agreed with the plan and demonstrated  an understanding of the instructions.   The patient was advised to call back or seek an in-person evaluation if the symptoms worsen or if the condition fails to improve as anticipated.    [name], NP    Review of Systems  Constitutional: Negative for fever.  HENT: Positive for congestion.   Respiratory: Negative for shortness of breath.        Slight cough now, significantly improved  Cardiovascular: Negative for chest pain.  Musculoskeletal: Positive for arthralgias.  Neurological: Positive for numbness and headaches.       Past Medical History:  Diagnosis Date  . Allergy   . Anemia   . Anxiety   . Arthritis   . Blood transfusion without reported diagnosis   . Cataract    a. bilerteral cataracts removed  . Coronary atherosclerosis of native coronary artery    a. 2003 Cath/unsuccessful PCI of occluded RCA;  b. 2005 NSTEMI/Cath: CTO RCA w/ L->R collats, LAD 20, LCX 82m, nl EF;  c. 11/2011 Myoview: no ischemia.  . Diastolic dysfunction 82/95/6213   a. 11/2011 Echo: Ejection fraction 60-65%, gr1 DD, basal inferior AK;  b. 09/2013 Echo: EF 55-60%, mild LVH, no rwma, Gr1 DD, mildly dil LA.  . Gastritis    a. 04/2016 EGD: Nl esophagus, gastritis, nl duodenal bulb & 2nd portion of the duodenum.  Marland Kitchen Headache(784.0)   . Hyperlipidemia   . Hypertension   . Hypokalemia   . Tubular adenoma of colon 2013     Social History   Socioeconomic History  . Marital status: Widowed    Spouse name: Not on file  . Number of children: 5  . Years of education: Not on file  . Highest education level: Not on file  Occupational History  . Occupation: Retired  Tobacco Use  . Smoking status: Former Smoker    Packs/day: 0.50    Years: 25.00    Pack years: 12.50    Types: Cigarettes    Quit date: 04/06/1996    Years since quitting: 23.7  . Smokeless tobacco: Never Used  Substance and Sexual Activity  . Alcohol use: No  . Drug use: No  . Sexual activity: Not on file  Other Topics  Concern  . Not on file  Social History Narrative   Currently living with dtr in [place].  No regular exercise.   Social Determinants of Health   Financial Resource Strain:   . Difficulty of Paying Living Expenses: Not on file  Food Insecurity:   . Worried About Charity fundraiser in the Last Year: Not on file  . Ran Out of Food in the Last Year: Not on file  Transportation Needs:   . Lack of Transportation (Medical): Not on file  . Lack of Transportation (Non-Medical): Not on file  Physical Activity:   . Days of Exercise per Week: Not on file  . Minutes of Exercise per Session: Not on file  Stress:   . Feeling of Stress : Not on file  Social Connections:   . Frequency of Communication with Friends and Family: Not on file  . Frequency of Social Gatherings with Friends and Family: Not  on file  . Attends Religious Services: Not on file  . Active Member of Clubs or Organizations: Not on file  . Attends Archivist Meetings: Not on file  . Marital Status: Not on file  Intimate Partner Violence:   . Fear of Current or Ex-Partner: Not on file  . Emotionally Abused: Not on file  . Physically Abused: Not on file  . Sexually Abused: Not on file    Past Surgical History:  Procedure Laterality Date  . CARPAL TUNNEL RELEASE Bilateral   . CHOLECYSTECTOMY  2004  . COLONOSCOPY  08/06/2011   Procedure: COLONOSCOPY;  Surgeon: [name], MD;  [facility];  Service: Endoscopy;  Laterality: N/A;  10:40 AM  . CORONARY ANGIOGRAPHY N/A 08/02/2017   Procedure: CORONARY ANGIOGRAPHY;  Surgeon: Lorretta Harp, MD;  Location: [facility];  Service: Cardiovascular;  Laterality: N/A;  . GALLBLADDER SURGERY    . LEFT HEART CATH AND CORONARY ANGIOGRAPHY N/A 06/15/2016   Procedure: Left Heart Cath and Coronary Angiography;  Surgeon: Lorretta Harp, MD;  Location: [facility] [facility];  Service: Cardiovascular;  Laterality: N/A;  . PARTIAL HYSTERECTOMY    . repair of belly  button    . UMBILICAL HERNIA REPAIR     Umbilical hernia repair as a child    Family History  Problem Relation Age of Onset  . Coronary artery disease Father   . Heart attack Father   . Colon cancer Father   . Coronary artery disease Mother   . Stomach cancer Mother   . Heart disease Brother   . Diabetes Brother   . Pancreatic cancer Neg Hx   . Rectal cancer Neg Hx   . Esophageal cancer Neg Hx     Allergies  Allergen Reactions  . Ranexa [Ranolazine] Other (See Comments)    Does NOT "agree" with the patient- does not feel like herself    Current Outpatient Medications on File Prior to Visit  Medication Sig Dispense Refill  . amLODipine (NORVASC) 2.5 MG tablet TAKE 1 TABLET BY MOUTH EVERY DAY (Patient taking differently: Take 2.5 mg by mouth daily. ) 90 tablet 1  . aspirin EC 81 MG tablet Take 81 mg by mouth every morning.    Marland Kitchen atenolol (TENORMIN) 25 MG tablet Take 1 tablet (25 mg total) by mouth daily. 90 tablet 2  . atorvastatin (LIPITOR) 80 MG tablet TAKE 1 TABLET BY MOUTH EVERY DAY IN THE EVENING FOR CHOLESTEROL (Patient taking differently: Take 80 mg by mouth every evening. ) 90 tablet 2  . busPIRone (BUSPAR) 7.5 MG tablet Take 1 tablet (7.5 mg total) by mouth 2 (two) times daily. 180 tablet 3  . calcium carbonate (CALCIUM 600) 600 MG TABS tablet Take 1,200 mg by mouth daily.    . cetirizine (ZYRTEC) 10 MG tablet Take 10 mg by mouth daily.    . clopidogrel (PLAVIX) 75 MG tablet TAKE 1 TABLET BY MOUTH EVERY DAY (Patient taking differently: Take 75 mg by mouth at bedtime. ) 90 tablet 3  . famotidine (PEPCID) 20 MG tablet Take 20 mg by mouth daily.     . isosorbide mononitrate (IMDUR) 120 MG 24 hr tablet Take 60 mg by mouth in the morning.    . Melatonin 5 MG TABS Take 5 mg by mouth at bedtime as needed (sleep).     . nitroGLYCERIN (NITROSTAT) 0.4 MG SL tablet PLACE 1 TABLET UNDER THE TONGUE EVERY 5 MIN AS NEEDED FOR CHEST  PAIN (Patient taking differently: Place 0.4 mg under the  tongue every 5 (five) minutes as needed for chest pain. ) 25 tablet 11  . PROAIR HFA 108 (90 Base) MCG/ACT inhaler Inhale 2 puffs into the lungs every 6 (six) hours as needed for wheezing or shortness of breath.    . sertraline (ZOLOFT) 100 MG tablet TAKE 1 TABLET BY MOUTH ONCE DAILY FOR DEPRESSION/ANXIETY. (Patient taking differently: Take 100 mg by mouth at bedtime. ) 90 tablet 1  . spironolactone (ALDACTONE) 25 MG tablet Take 0.5 tablets (12.5 mg total) by mouth daily. 45 tablet 3  . tiotropium (SPIRIVA HANDIHALER) 18 MCG inhalation capsule Place 1 capsule (18 mcg total) into inhaler and inhale daily. 30 capsule 0  . ranolazine (RANEXA) 500 MG 12 hr tablet Take 1 tablet (500 mg total) by mouth 2 (two) times daily. (Patient not taking: Reported on 01/04/2020) 180 tablet 1   No current facility-administered medications on file prior to visit.    BP (!) 183/86   Temp 97.8 F (36.6 C) (Oral)   Ht 4\' 11"  (1.499 m)   Wt 144 lb 9.6 oz (65.6 kg)   BMI 29.21 kg/m    Objective:   Physical Exam Pulmonary:     Effort: Pulmonary effort is normal.     Comments: Coughed once during visit Neurological:     Mental Status: She is alert and oriented to person, place, and time.  Psychiatric:        Mood and Affect: Mood normal.            Assessment & Plan:

## 2020-01-04 NOTE — Telephone Encounter (Signed)
Called patient moved to virtual will call closer to app time to check in.

## 2020-01-05 DIAGNOSIS — I110 Hypertensive heart disease with heart failure: Secondary | ICD-10-CM | POA: Diagnosis not present

## 2020-01-05 DIAGNOSIS — I5032 Chronic diastolic (congestive) heart failure: Secondary | ICD-10-CM | POA: Diagnosis not present

## 2020-01-05 DIAGNOSIS — J439 Emphysema, unspecified: Secondary | ICD-10-CM | POA: Diagnosis not present

## 2020-01-05 DIAGNOSIS — J9601 Acute respiratory failure with hypoxia: Secondary | ICD-10-CM | POA: Diagnosis not present

## 2020-01-05 NOTE — Telephone Encounter (Signed)
Cindy Brandt, can we send over Rx for bench shower chair? See My Chart message.

## 2020-01-08 ENCOUNTER — Telehealth: Payer: Self-pay | Admitting: *Deleted

## 2020-01-08 ENCOUNTER — Other Ambulatory Visit: Payer: Self-pay | Admitting: Primary Care

## 2020-01-08 DIAGNOSIS — I5032 Chronic diastolic (congestive) heart failure: Secondary | ICD-10-CM | POA: Diagnosis not present

## 2020-01-08 DIAGNOSIS — J9601 Acute respiratory failure with hypoxia: Secondary | ICD-10-CM | POA: Diagnosis not present

## 2020-01-08 DIAGNOSIS — R059 Cough, unspecified: Secondary | ICD-10-CM

## 2020-01-08 NOTE — Telephone Encounter (Signed)
Agreed and approved. She should be using the Incruse inhaler daily until empty, then switch over to Spiriva which was prescribed initially.

## 2020-01-08 NOTE — Telephone Encounter (Signed)
Diane nurse with [facility] left a voicemail requesting orders for nursing for once a week for 4 weeks. Diane stated that patient is recently out of the hospital from pneumonia. Diane stated that when she saw the patient she was having some SOB, but had not been using her inhaler. Diane stated that patient's oxygen level while she was there had dropped to 89% without her oxygen but oxygen level came back up. Diane stated that she feels that patient needs education on disease management and medication management.

## 2020-01-08 NOTE — Telephone Encounter (Signed)
Called and given information. No further questions at this time.

## 2020-01-08 NOTE — Telephone Encounter (Signed)
Please sign and close encounter when completed. 

## 2020-01-09 DIAGNOSIS — J9601 Acute respiratory failure with hypoxia: Secondary | ICD-10-CM | POA: Diagnosis not present

## 2020-01-09 DIAGNOSIS — I110 Hypertensive heart disease with heart failure: Secondary | ICD-10-CM | POA: Diagnosis not present

## 2020-01-09 DIAGNOSIS — J439 Emphysema, unspecified: Secondary | ICD-10-CM | POA: Diagnosis not present

## 2020-01-09 DIAGNOSIS — I5032 Chronic diastolic (congestive) heart failure: Secondary | ICD-10-CM | POA: Diagnosis not present

## 2020-01-11 ENCOUNTER — Other Ambulatory Visit: Payer: Self-pay

## 2020-01-11 DIAGNOSIS — M79671 Pain in right foot: Secondary | ICD-10-CM

## 2020-01-11 DIAGNOSIS — J189 Pneumonia, unspecified organism: Secondary | ICD-10-CM

## 2020-01-11 DIAGNOSIS — J9601 Acute respiratory failure with hypoxia: Secondary | ICD-10-CM | POA: Diagnosis not present

## 2020-01-11 DIAGNOSIS — J439 Emphysema, unspecified: Secondary | ICD-10-CM | POA: Diagnosis not present

## 2020-01-11 DIAGNOSIS — M1712 Unilateral primary osteoarthritis, left knee: Secondary | ICD-10-CM

## 2020-01-11 DIAGNOSIS — I110 Hypertensive heart disease with heart failure: Secondary | ICD-10-CM | POA: Diagnosis not present

## 2020-01-11 DIAGNOSIS — I5032 Chronic diastolic (congestive) heart failure: Secondary | ICD-10-CM | POA: Diagnosis not present

## 2020-01-11 NOTE — Telephone Encounter (Signed)
Order sent.

## 2020-01-16 DIAGNOSIS — I110 Hypertensive heart disease with heart failure: Secondary | ICD-10-CM | POA: Diagnosis not present

## 2020-01-16 DIAGNOSIS — J9601 Acute respiratory failure with hypoxia: Secondary | ICD-10-CM | POA: Diagnosis not present

## 2020-01-16 DIAGNOSIS — I5032 Chronic diastolic (congestive) heart failure: Secondary | ICD-10-CM | POA: Diagnosis not present

## 2020-01-16 DIAGNOSIS — J439 Emphysema, unspecified: Secondary | ICD-10-CM | POA: Diagnosis not present

## 2020-01-18 DIAGNOSIS — I110 Hypertensive heart disease with heart failure: Secondary | ICD-10-CM | POA: Diagnosis not present

## 2020-01-18 DIAGNOSIS — I5032 Chronic diastolic (congestive) heart failure: Secondary | ICD-10-CM | POA: Diagnosis not present

## 2020-01-18 DIAGNOSIS — J439 Emphysema, unspecified: Secondary | ICD-10-CM | POA: Diagnosis not present

## 2020-01-18 DIAGNOSIS — J9601 Acute respiratory failure with hypoxia: Secondary | ICD-10-CM | POA: Diagnosis not present

## 2020-01-19 ENCOUNTER — Other Ambulatory Visit: Payer: Self-pay | Admitting: Primary Care

## 2020-01-19 ENCOUNTER — Other Ambulatory Visit: Payer: Self-pay | Admitting: Cardiovascular Disease

## 2020-01-19 DIAGNOSIS — M79671 Pain in right foot: Secondary | ICD-10-CM

## 2020-01-19 DIAGNOSIS — M159 Polyosteoarthritis, unspecified: Secondary | ICD-10-CM

## 2020-01-19 DIAGNOSIS — I110 Hypertensive heart disease with heart failure: Secondary | ICD-10-CM | POA: Diagnosis not present

## 2020-01-19 DIAGNOSIS — I5032 Chronic diastolic (congestive) heart failure: Secondary | ICD-10-CM | POA: Diagnosis not present

## 2020-01-19 DIAGNOSIS — E785 Hyperlipidemia, unspecified: Secondary | ICD-10-CM

## 2020-01-19 DIAGNOSIS — J9601 Acute respiratory failure with hypoxia: Secondary | ICD-10-CM | POA: Diagnosis not present

## 2020-01-19 DIAGNOSIS — I25118 Atherosclerotic heart disease of native coronary artery with other forms of angina pectoris: Secondary | ICD-10-CM

## 2020-01-19 DIAGNOSIS — F411 Generalized anxiety disorder: Secondary | ICD-10-CM

## 2020-01-19 DIAGNOSIS — J439 Emphysema, unspecified: Secondary | ICD-10-CM | POA: Diagnosis not present

## 2020-01-19 DIAGNOSIS — R059 Cough, unspecified: Secondary | ICD-10-CM

## 2020-01-19 DIAGNOSIS — M79672 Pain in left foot: Secondary | ICD-10-CM

## 2020-01-22 ENCOUNTER — Telehealth: Payer: Self-pay | Admitting: *Deleted

## 2020-01-22 NOTE — Telephone Encounter (Signed)
Noted  

## 2020-01-22 NOTE — Telephone Encounter (Signed)
Diane nurse with [facility] left a voicemail stating that patient has requested that all home health be skipped this week. Diane stated that patient has family in town for Thanksgiving. Diane stated that she will pick services up next week.

## 2020-01-29 DIAGNOSIS — Z7902 Long term (current) use of antithrombotics/antiplatelets: Secondary | ICD-10-CM

## 2020-01-29 DIAGNOSIS — I110 Hypertensive heart disease with heart failure: Secondary | ICD-10-CM

## 2020-01-29 DIAGNOSIS — J189 Pneumonia, unspecified organism: Secondary | ICD-10-CM

## 2020-01-29 DIAGNOSIS — I2510 Atherosclerotic heart disease of native coronary artery without angina pectoris: Secondary | ICD-10-CM

## 2020-01-29 DIAGNOSIS — J9601 Acute respiratory failure with hypoxia: Secondary | ICD-10-CM

## 2020-01-29 DIAGNOSIS — K219 Gastro-esophageal reflux disease without esophagitis: Secondary | ICD-10-CM

## 2020-01-29 DIAGNOSIS — Z7982 Long term (current) use of aspirin: Secondary | ICD-10-CM

## 2020-01-29 DIAGNOSIS — F419 Anxiety disorder, unspecified: Secondary | ICD-10-CM

## 2020-01-29 DIAGNOSIS — J9811 Atelectasis: Secondary | ICD-10-CM

## 2020-01-29 DIAGNOSIS — I5032 Chronic diastolic (congestive) heart failure: Secondary | ICD-10-CM

## 2020-01-29 DIAGNOSIS — F32A Depression, unspecified: Secondary | ICD-10-CM

## 2020-01-29 DIAGNOSIS — I447 Left bundle-branch block, unspecified: Secondary | ICD-10-CM

## 2020-01-29 DIAGNOSIS — J42 Unspecified chronic bronchitis: Secondary | ICD-10-CM

## 2020-01-29 DIAGNOSIS — I700 Atherosclerosis of aorta: Secondary | ICD-10-CM

## 2020-01-29 DIAGNOSIS — R7303 Prediabetes: Secondary | ICD-10-CM

## 2020-01-29 DIAGNOSIS — I252 Old myocardial infarction: Secondary | ICD-10-CM

## 2020-01-29 DIAGNOSIS — M170 Bilateral primary osteoarthritis of knee: Secondary | ICD-10-CM

## 2020-01-29 DIAGNOSIS — Z9981 Dependence on supplemental oxygen: Secondary | ICD-10-CM

## 2020-01-29 DIAGNOSIS — J439 Emphysema, unspecified: Secondary | ICD-10-CM

## 2020-01-29 DIAGNOSIS — Z87891 Personal history of nicotine dependence: Secondary | ICD-10-CM

## 2020-01-29 DIAGNOSIS — Z9181 History of falling: Secondary | ICD-10-CM

## 2020-01-29 DIAGNOSIS — M47814 Spondylosis without myelopathy or radiculopathy, thoracic region: Secondary | ICD-10-CM

## 2020-01-29 DIAGNOSIS — E785 Hyperlipidemia, unspecified: Secondary | ICD-10-CM

## 2020-01-29 DIAGNOSIS — D509 Iron deficiency anemia, unspecified: Secondary | ICD-10-CM

## 2020-01-29 DIAGNOSIS — D72829 Elevated white blood cell count, unspecified: Secondary | ICD-10-CM

## 2020-02-01 DIAGNOSIS — I5032 Chronic diastolic (congestive) heart failure: Secondary | ICD-10-CM | POA: Diagnosis not present

## 2020-02-01 DIAGNOSIS — J9601 Acute respiratory failure with hypoxia: Secondary | ICD-10-CM | POA: Diagnosis not present

## 2020-02-01 DIAGNOSIS — I110 Hypertensive heart disease with heart failure: Secondary | ICD-10-CM | POA: Diagnosis not present

## 2020-02-01 DIAGNOSIS — J439 Emphysema, unspecified: Secondary | ICD-10-CM | POA: Diagnosis not present

## 2020-02-02 DIAGNOSIS — J9601 Acute respiratory failure with hypoxia: Secondary | ICD-10-CM | POA: Diagnosis not present

## 2020-02-02 DIAGNOSIS — I5032 Chronic diastolic (congestive) heart failure: Secondary | ICD-10-CM | POA: Diagnosis not present

## 2020-02-02 DIAGNOSIS — J439 Emphysema, unspecified: Secondary | ICD-10-CM | POA: Diagnosis not present

## 2020-02-02 DIAGNOSIS — I110 Hypertensive heart disease with heart failure: Secondary | ICD-10-CM | POA: Diagnosis not present

## 2020-02-05 ENCOUNTER — Ambulatory Visit (INDEPENDENT_AMBULATORY_CARE_PROVIDER_SITE_OTHER)
Admission: RE | Admit: 2020-02-05 | Discharge: 2020-02-05 | Disposition: A | Discharge status: Home / Self Care | Payer: Medicare Other | Source: Ambulatory Visit | Attending: Primary Care | Admitting: Primary Care

## 2020-02-05 ENCOUNTER — Encounter: Payer: Self-pay | Admitting: Primary Care

## 2020-02-05 ENCOUNTER — Ambulatory Visit (INDEPENDENT_AMBULATORY_CARE_PROVIDER_SITE_OTHER): Payer: Medicare Other | Admitting: Primary Care

## 2020-02-05 ENCOUNTER — Other Ambulatory Visit: Payer: Self-pay

## 2020-02-05 VITALS — BP 118/62 | HR 78 | Temp 97.8°F | Ht 59.0 in | Wt 142.0 lb

## 2020-02-05 DIAGNOSIS — J189 Pneumonia, unspecified organism: Secondary | ICD-10-CM | POA: Diagnosis not present

## 2020-02-05 DIAGNOSIS — J449 Chronic obstructive pulmonary disease, unspecified: Secondary | ICD-10-CM

## 2020-02-05 DIAGNOSIS — J9601 Acute respiratory failure with hypoxia: Secondary | ICD-10-CM

## 2020-02-05 DIAGNOSIS — R32 Unspecified urinary incontinence: Secondary | ICD-10-CM

## 2020-02-05 DIAGNOSIS — R0602 Shortness of breath: Secondary | ICD-10-CM | POA: Diagnosis not present

## 2020-02-05 DIAGNOSIS — J9811 Atelectasis: Secondary | ICD-10-CM | POA: Diagnosis not present

## 2020-02-05 DIAGNOSIS — H938X2 Other specified disorders of left ear: Secondary | ICD-10-CM | POA: Insufficient documentation

## 2020-02-05 MED ORDER — FLUTICASONE PROPIONATE 50 MCG/ACT NA SUSP: 1.0000 | Freq: Two times a day (BID) | NASAL | 0 refills | Status: DC

## 2020-02-05 MED ORDER — SOLIFENACIN SUCCINATE 5 MG PO TABS: 5.0000 mg | ORAL_TABLET | Freq: Every day | ORAL | 0 refills | Status: DC

## 2020-02-05 MED ORDER — FLUTICASONE-SALMETEROL 250-50 MCG/DOSE IN AEPB: 1.0000 | INHALATION_SPRAY | Freq: Two times a day (BID) | RESPIRATORY_TRACT | 0 refills | Status: DC

## 2020-02-05 NOTE — Assessment & Plan Note (Signed)
Appears much better today, but she is still hypoxic on room air. Repeat chest xray pending today.  Given exertional dyspnea and hypoxia, will add Advair BID to her Spiriva.  She will update.

## 2020-02-05 NOTE — Progress Notes (Signed)
Subjective:    Patient ID: Cindy Brandt, female    DOB: 06/17/1931, 86 y.o.   MRN: [id number]  HPI  This visit occurred during the SARS-CoV-2 public health emergency.  Safety protocols were in place, including screening questions prior to the visit, additional usage of staff PPE, and extensive cleaning of exam room while observing appropriate contact time as indicated for disinfecting solutions.   Cindy Brandt is a 86 year old female with a history of CAD, community acquired pneumonia with acute hypoxia, GERD, hyperlipidemia, CHF, prediabetes who presents today with a chief complaint of ear fullness.    She's noticed decrease hearing to the left ear with pressure radiating behind her left ear up to the left parietal lobe. Her pressure has improved but she continues to notice fullness to the left ear. She wears hearing aids. This began a few weeks ago.  Hospital admission in early November 2021 for community acquired pneumonia, overall improving with significantly less coughing, some congestion.  She continues to remain on supplemental oxygen at 2 liters for which she's been utilizing since her hospital visit. She's using her incentive spirometer every 1-3 hours. She is compliant to her Spiriva daily. She's running 94-95% on 2 liters of oxygen at rest, lower with exertion.   She was released by home health physical therapy and nursing last week. She's noticed shortness of breath without supplemental oxygen, but this improves with resuming oxygen.   She's heard nothing in regard to the Rollator walker.   She continues to experience urinary incontinence. She was once managed on oxybutynin and Myrbetriq but neither medications were effective. She is saturating pads throughout the day, mostly when standing to attempt to walk to the bathroom.   BP Readings from Last 3 Encounters:  02/05/20 118/62  01/04/20 (!) 183/86  12/29/19 (!) 125/94     Review of Systems  Constitutional: Negative for fever.    HENT:       Ear fullness  Respiratory: Positive for shortness of breath. Negative for cough.   Cardiovascular: Negative for chest pain.  Genitourinary:       Urinary incontinence   Neurological: Negative for dizziness.       Past Medical History:  Diagnosis Date  . Allergy   . Anemia   . Anxiety   . Arthritis   . Blood transfusion without reported diagnosis   . Cataract    a. bilerteral cataracts removed  . Coronary atherosclerosis of native coronary artery    a. 2003 Cath/unsuccessful PCI of occluded RCA;  b. 2005 NSTEMI/Cath: CTO RCA w/ L->R collats, LAD 20, LCX 54m, nl EF;  c. 11/2011 Myoview: no ischemia.  . Diastolic dysfunction 32/35/5732   a. 11/2011 Echo: Ejection fraction 60-65%, gr1 DD, basal inferior AK;  b. 09/2013 Echo: EF 55-60%, mild LVH, no rwma, Gr1 DD, mildly dil LA.  . Gastritis    a. 04/2016 EGD: Nl esophagus, gastritis, nl duodenal bulb & 2nd portion of the duodenum.  Marland Kitchen Headache(784.0)   . Hyperlipidemia   . Hypertension   . Hypokalemia   . Tubular adenoma of colon 2013     Social History   Socioeconomic History  . Marital status: Widowed    Spouse name: Not on file  . Number of children: 5  . Years of education: Not on file  . Highest education level: Not on file  Occupational History  . Occupation: Retired  Tobacco Use  . Smoking status: Former Smoker    Packs/day: 0.50  Years: 25.00    Pack years: 12.50    Types: Cigarettes    Quit date: 04/06/1996    Years since quitting: 23.8  . Smokeless tobacco: Never Used  Substance and Sexual Activity  . Alcohol use: No  . Drug use: No  . Sexual activity: Not on file  Other Topics Concern  . Not on file  Social History Narrative   Currently living with dtr in [place].  No regular exercise.   Social Determinants of Health   Financial Resource Strain:   . Difficulty of Paying Living Expenses: Not on file  Food Insecurity:   . Worried About Charity fundraiser in the Last Year: Not on file  . Ran  Out of Food in the Last Year: Not on file  Transportation Needs:   . Lack of Transportation (Medical): Not on file  . Lack of Transportation (Non-Medical): Not on file  Physical Activity:   . Days of Exercise per Week: Not on file  . Minutes of Exercise per Session: Not on file  Stress:   . Feeling of Stress : Not on file  Social Connections:   . Frequency of Communication with Friends and Family: Not on file  . Frequency of Social Gatherings with Friends and Family: Not on file  . Attends Religious Services: Not on file  . Active Member of Clubs or Organizations: Not on file  . Attends Archivist Meetings: Not on file  . Marital Status: Not on file  Intimate Partner Violence:   . Fear of Current or Ex-Partner: Not on file  . Emotionally Abused: Not on file  . Physically Abused: Not on file  . Sexually Abused: Not on file    Past Surgical History:  Procedure Laterality Date  . CARPAL TUNNEL RELEASE Bilateral   . CHOLECYSTECTOMY  2004  . COLONOSCOPY  08/06/2011   Procedure: COLONOSCOPY;  Surgeon: [name], MD;  [facility];  Service: Endoscopy;  Laterality: N/A;  10:40 AM  . CORONARY ANGIOGRAPHY N/A 08/02/2017   Procedure: CORONARY ANGIOGRAPHY;  Surgeon: Lorretta Harp, MD;  Location: [facility];  Service: Cardiovascular;  Laterality: N/A;  . GALLBLADDER SURGERY    . LEFT HEART CATH AND CORONARY ANGIOGRAPHY N/A 06/15/2016   Procedure: Left Heart Cath and Coronary Angiography;  Surgeon: Lorretta Harp, MD;  Location: [facility] [facility];  Service: Cardiovascular;  Laterality: N/A;  . PARTIAL HYSTERECTOMY    . repair of belly button    . UMBILICAL HERNIA REPAIR     Umbilical hernia repair as a child    Family History  Problem Relation Age of Onset  . Coronary artery disease Father   . Heart attack Father   . Colon cancer Father   . Coronary artery disease Mother   . Stomach cancer Mother   . Heart disease Brother   . Diabetes Brother     . Pancreatic cancer Neg Hx   . Rectal cancer Neg Hx   . Esophageal cancer Neg Hx     Allergies  Allergen Reactions  . Ranexa [Ranolazine] Other (See Comments)    Does NOT "agree" with the patient- does not feel like herself    Current Outpatient Medications on File Prior to Visit  Medication Sig Dispense Refill  . amLODipine (NORVASC) 2.5 MG tablet NEW PRESCRIPTION REQUEST: TAKE ONE TABLET BY MOUTH DAILY 90 tablet 3  . aspirin EC 81 MG tablet Take 81 mg by mouth every morning.    Marland Kitchen  atenolol (TENORMIN) 25 MG tablet NEW PRESCRIPTION REQUEST: TAKE ONE TABLET BY MOUTH DAILY 90 tablet 3  . atorvastatin (LIPITOR) 80 MG tablet NEW PRESCRIPTION REQUEST: TAKE ONE TABLET BY MOUTH DAILY 90 tablet 3  . busPIRone (BUSPAR) 7.5 MG tablet NEW PRESCRIPTION REQUEST: TAKE ONE TABLET BY MOUTH TWICE DAILY 180 tablet 3  . calcium carbonate (CALCIUM 600) 600 MG TABS tablet Take 1,200 mg by mouth daily.    . cetirizine (ZYRTEC) 10 MG tablet Take 10 mg by mouth daily.    . clopidogrel (PLAVIX) 75 MG tablet NEW PRESCRIPTION REQUEST: TAKE ONE TABLET BY MOUTH DAILY 90 tablet 3  . famotidine (PEPCID) 20 MG tablet Take 20 mg by mouth daily.     Marland Kitchen gabapentin (NEURONTIN) 100 MG capsule NEW PRESCRIPTION REQUEST: TAKE ONE CAPSULE BY MOUTH IN THE MORNING AND TAKE TWO CAPSULES BY MOUHT IN THE EVENING 270 capsule 3  . isosorbide mononitrate (IMDUR) 120 MG 24 hr tablet Take 60 mg by mouth in the morning.    . isosorbide mononitrate (IMDUR) 30 MG 24 hr tablet NEW PRESCRIPTION REQUEST: TAKE ONE AND ONE-HALF TABLET BY MOUTH DAILY 135 tablet 3  . Melatonin 5 MG TABS Take 5 mg by mouth at bedtime as needed (sleep).     . nitroGLYCERIN (NITROSTAT) 0.4 MG SL tablet PLACE 1 TABLET UNDER THE TONGUE EVERY 5 MIN AS NEEDED FOR CHEST PAIN (Patient taking differently: Place 0.4 mg under the tongue every 5 (five) minutes as needed for chest pain. ) 25 tablet 11  . PROAIR HFA 108 (90 Base) MCG/ACT inhaler NEW PRESCRIPTION REQUEST: INHALE  ONE PUFF BY MOUTH EVERY FOUR HOURS AS NEEDED 25.5 g 3  . sertraline (ZOLOFT) 100 MG tablet NEW PRESCRIPTION REQUEST: TAKE ONE TABLET BY MOUTH DAILY 90 tablet 3  . SPIRIVA HANDIHALER 18 MCG inhalation capsule NEW PRESCRIPTION REQUEST: INHALE CONTENTS OF ONE CAPSULE VIA HANDIHALER DAILY 90 capsule 3  . spironolactone (ALDACTONE) 25 MG tablet NEW PRESCRIPTION REQUEST: TAKE HALF TABLET BY MOUTH DAILY 45 tablet 3   No current facility-administered medications on file prior to visit.    BP 118/62   Pulse 78   Temp 97.8 F (36.6 C) (Temporal)   Ht 4\' 11"  (1.499 m)   Wt 142 lb (64.4 kg) Comment: per pt today at home  SpO2 94% Comment: 2 L o2  BMI 28.68 kg/m    Objective:   Physical Exam HENT:     Right Ear: Tympanic membrane and ear canal normal.     Left Ear: Tympanic membrane and ear canal normal.  Cardiovascular:     Rate and Rhythm: Normal rate and regular rhythm.  Pulmonary:     Effort: Pulmonary effort is normal.     Breath sounds: Rhonchi present. No wheezing.     Comments: Mild rhonchi noted to left and right bases Skin:    General: Skin is warm and dry.  Neurological:     Mental Status: She is alert and oriented to person, place, and time.  Psychiatric:        Mood and Affect: Mood normal.            Assessment & Plan:

## 2020-02-05 NOTE — Assessment & Plan Note (Signed)
Drops in oxygen saturation to high 80's today on room air at rest. Family endorses lower levels with exertion on room air.   Repeat chest xray pending today since pneumonia in early November 2021.  Given exertional dyspnea and hypoxia, will add Advair BID to her Spiriva. Referral also place to pulmonology for evaluation.

## 2020-02-05 NOTE — Assessment & Plan Note (Signed)
Exam today unremarkable. Trial of Flonase sent to pharmacy. She will update.

## 2020-02-05 NOTE — Assessment & Plan Note (Signed)
No improvement with Myrbetriq or oxybutynin, ongoing symptoms. Will trial Vesicare, but will provide precautions regarding oral dryness and to ensure proper hydration.   Her daughter will update.

## 2020-02-05 NOTE — Assessment & Plan Note (Signed)
Could be contributing to symptoms. Add Advair BID to Spiriva. Continue supplemental oxygen for now. Referral placed to pulmonology for evaluation given ongoing hypoxia on room air.

## 2020-02-05 NOTE — Patient Instructions (Addendum)
Complete xray(s) prior to leaving today. I will notify you of your results once received.  Start fluticasone-salmeterol (Adviar) inhaler for breathing. Inhale 1 puff into the lungs twice daily. Rinse your mouth after each use.  Continue using tiotropium (Spiriva) for breathing.  Ear Pressure: Try using Flonase (fluticasone) nasal spray. Instill 1 spray in each nostril twice daily.   You will be contacted regarding your referral to pulmonology.  Please let us know if you have not been contacted within two weeks.   It was a pleasure to see you today!

## 2020-02-07 DIAGNOSIS — J9601 Acute respiratory failure with hypoxia: Secondary | ICD-10-CM | POA: Diagnosis not present

## 2020-02-07 DIAGNOSIS — I5032 Chronic diastolic (congestive) heart failure: Secondary | ICD-10-CM | POA: Diagnosis not present

## 2020-02-08 ENCOUNTER — Telehealth: Payer: Self-pay | Admitting: Cardiovascular Disease

## 2020-02-08 NOTE — Telephone Encounter (Signed)
*  STAT* If patient is at the pharmacy, call can be transferred to refill team.   1. Which medications need to be refilled? (please list name of each medication and dose if known)  isosorbide mononitrate (IMDUR) 30 MG 24 hr tablet  2. Which pharmacy/location (including street and city if local pharmacy) is medication to be sent to? [facility]- [place], [place] - [place], [place] [address]een Mans [address]  3. Do they need a 30 day or 90 day supply? [place], Patient's daughter called Forensic scientist and informed the pharmacy that the rx should be for 60 mg once daily and not 45 mg once daily. Alvis Lemmings had instructions for the patient to take 45 mg daily but wanted to confirm with Dr. Gwenlyn Found what the exact dosage should be. Please clarify with Dr. Gwenlyn Found and send updated rx to pharmacy

## 2020-02-09 NOTE — Telephone Encounter (Signed)
60 mg of Imdur daily is fine

## 2020-02-15 ENCOUNTER — Other Ambulatory Visit: Payer: Self-pay

## 2020-02-15 ENCOUNTER — Encounter: Payer: Self-pay | Admitting: Pulmonary Disease

## 2020-02-15 ENCOUNTER — Ambulatory Visit: Payer: Medicare Other | Admitting: Pulmonary Disease

## 2020-02-15 VITALS — BP 130/80 | HR 72 | Temp 98.0°F | Ht 59.0 in | Wt 142.2 lb

## 2020-02-15 DIAGNOSIS — I5032 Chronic diastolic (congestive) heart failure: Secondary | ICD-10-CM

## 2020-02-15 DIAGNOSIS — J449 Chronic obstructive pulmonary disease, unspecified: Secondary | ICD-10-CM

## 2020-02-15 DIAGNOSIS — Z87891 Personal history of nicotine dependence: Secondary | ICD-10-CM | POA: Diagnosis not present

## 2020-02-15 DIAGNOSIS — J189 Pneumonia, unspecified organism: Secondary | ICD-10-CM | POA: Diagnosis not present

## 2020-02-15 DIAGNOSIS — J9611 Chronic respiratory failure with hypoxia: Secondary | ICD-10-CM

## 2020-02-15 DIAGNOSIS — J432 Centrilobular emphysema: Secondary | ICD-10-CM | POA: Diagnosis not present

## 2020-02-15 MED ORDER — TRELEGY ELLIPTA 100-62.5-25 MCG/INH IN AEPB: 1.0000 | INHALATION_SPRAY | Freq: Every day | RESPIRATORY_TRACT | 3 refills | Status: DC

## 2020-02-15 NOTE — Patient Instructions (Addendum)
Thank you for visiting Dr. Valeta Harms at [facility]. Today we recommend the following:  Meds ordered this encounter  Medications   Fluticasone-Umeclidin-Vilant (TRELEGY ELLIPTA) 100-62.5-25 MCG/INH AEPB    Sig: Inhale 1 puff into the lungs daily.    Dispense:  60 each    Refill:  3   DME supply for nebulizer machine, tubing and albuterol solution  Return in about 10 years (around 02/14/2030) for with APP or Dr. Valeta Harms.    Please do your part to reduce the spread of COVID-19.

## 2020-02-15 NOTE — Progress Notes (Signed)
Synopsis: Referred in December 2021 for COPD by [name], NP  Subjective:   PATIENT ID: Cindy Brandt GENDER: female DOB: 09/29/1931, MRN: [id number]  Chief Complaint  Patient presents with  . Consult    Here for consult for COPD.  Having chest congestion and SHOB with exertion and low reading of oxygen    86 yo FM, PMH CAD, diastolic heart failure, was admitted in October 2021 for pneumonia, former smoker, quit in 1997, heaviest at less than 1 ppd.  Longstanding history of smoking.  Diagnosed with chronic hypoxemic respiratory failure discharged on oxygen.  Treated for pneumonia now recovered.  Still requiring oxygen.  Still feels short of breath with exertion has nocturnal wheezing.   Past Medical History:  Diagnosis Date  . Allergy   . Anemia   . Anxiety   . Arthritis   . Blood transfusion without reported diagnosis   . Cataract    a. bilerteral cataracts removed  . Coronary atherosclerosis of native coronary artery    a. 2003 Cath/unsuccessful PCI of occluded RCA;  b. 2005 NSTEMI/Cath: CTO RCA w/ L->R collats, LAD 20, LCX 2m, nl EF;  c. 11/2011 Myoview: no ischemia.  . Diastolic dysfunction 59/56/3875   a. 11/2011 Echo: Ejection fraction 60-65%, gr1 DD, basal inferior AK;  b. 09/2013 Echo: EF 55-60%, mild LVH, no rwma, Gr1 DD, mildly dil LA.  . Gastritis    a. 04/2016 EGD: Nl esophagus, gastritis, nl duodenal bulb & 2nd portion of the duodenum.  Marland Kitchen Headache(784.0)   . Hyperlipidemia   . Hypertension   . Hypokalemia   . Tubular adenoma of colon 2013     Family History  Problem Relation Age of Onset  . Coronary artery disease Father   . Heart attack Father   . Colon cancer Father   . Coronary artery disease Mother   . Stomach cancer Mother   . Heart disease Brother   . Diabetes Brother   . Pancreatic cancer Neg Hx   . Rectal cancer Neg Hx   . Esophageal cancer Neg Hx      Past Surgical History:  Procedure Laterality Date  . CARPAL TUNNEL RELEASE Bilateral    . CHOLECYSTECTOMY  2004  . COLONOSCOPY  08/06/2011   Procedure: COLONOSCOPY;  Surgeon: [name], MD;  Location: [facility];  Service: Endoscopy;  Laterality: N/A;  10:40 AM  . CORONARY ANGIOGRAPHY N/A 08/02/2017   Procedure: CORONARY ANGIOGRAPHY;  Surgeon: Lorretta Harp, MD;  Location: [place] [facility];  Service: Cardiovascular;  Laterality: N/A;  . GALLBLADDER SURGERY    . LEFT HEART CATH AND CORONARY ANGIOGRAPHY N/A 06/15/2016   Procedure: Left Heart Cath and Coronary Angiography;  Surgeon: Lorretta Harp, MD;  Location: [place] [facility];  Service: Cardiovascular;  Laterality: N/A;  . PARTIAL HYSTERECTOMY    . repair of belly button    . UMBILICAL HERNIA REPAIR     Umbilical hernia repair as a child    Social History   Socioeconomic History  . Marital status: Widowed    Spouse name: Not on file  . Number of children: 5  . Years of education: Not on file  . Highest education level: Not on file  Occupational History  . Occupation: Retired  Tobacco Use  . Smoking status: Former Smoker    Packs/day: 0.50    Years: 25.00    Pack years: 12.50    Types: Cigarettes    Quit date: 04/06/1996  Years since quitting: 23.8  . Smokeless tobacco: Never Used  Substance and Sexual Activity  . Alcohol use: No  . Drug use: No  . Sexual activity: Not on file  Other Topics Concern  . Not on file  Social History Narrative   Currently living with dtr in [place].  No regular exercise.   Social Determinants of Health   Financial Resource Strain: Not on file  Food Insecurity: Not on file  Transportation Needs: Not on file  Physical Activity: Not on file  Stress: Not on file  Social Connections: Not on file  Intimate Partner Violence: Not on file     Allergies  Allergen Reactions  . Ranexa [Ranolazine] Other (See Comments)    Does NOT "agree" with the patient- does not feel like herself     Outpatient Medications Prior to Visit  Medication Sig Dispense Refill  .  amLODipine (NORVASC) 2.5 MG tablet NEW PRESCRIPTION REQUEST: TAKE ONE TABLET BY MOUTH DAILY 90 tablet 3  . aspirin EC 81 MG tablet Take 81 mg by mouth every morning.    Marland Kitchen atenolol (TENORMIN) 25 MG tablet NEW PRESCRIPTION REQUEST: TAKE ONE TABLET BY MOUTH DAILY 90 tablet 3  . atorvastatin (LIPITOR) 80 MG tablet NEW PRESCRIPTION REQUEST: TAKE ONE TABLET BY MOUTH DAILY 90 tablet 3  . busPIRone (BUSPAR) 7.5 MG tablet NEW PRESCRIPTION REQUEST: TAKE ONE TABLET BY MOUTH TWICE DAILY 180 tablet 3  . calcium carbonate (OS-CAL) 600 MG TABS tablet Take 1,200 mg by mouth daily.    . cetirizine (ZYRTEC) 10 MG tablet Take 10 mg by mouth daily.    . clopidogrel (PLAVIX) 75 MG tablet NEW PRESCRIPTION REQUEST: TAKE ONE TABLET BY MOUTH DAILY 90 tablet 3  . famotidine (PEPCID) 20 MG tablet Take 20 mg by mouth daily.     . fluticasone (FLONASE) 50 MCG/ACT nasal spray Place 1 spray into both nostrils 2 (two) times daily. 16 g 0  . Fluticasone-Salmeterol (ADVAIR) 250-50 MCG/DOSE AEPB Inhale 1 puff into the lungs in the morning and at bedtime. 60 each 0  . gabapentin (NEURONTIN) 100 MG capsule NEW PRESCRIPTION REQUEST: TAKE ONE CAPSULE BY MOUTH IN THE MORNING AND TAKE TWO CAPSULES BY MOUHT IN THE EVENING 270 capsule 3  . isosorbide mononitrate (IMDUR) 120 MG 24 hr tablet Take 60 mg by mouth in the morning.    . Melatonin 5 MG TABS Take 5 mg by mouth at bedtime as needed (sleep).     . nitroGLYCERIN (NITROSTAT) 0.4 MG SL tablet PLACE 1 TABLET UNDER THE TONGUE EVERY 5 MIN AS NEEDED FOR CHEST PAIN (Patient taking differently: Place 0.4 mg under the tongue every 5 (five) minutes as needed for chest pain.) 25 tablet 11  . PROAIR HFA 108 (90 Base) MCG/ACT inhaler NEW PRESCRIPTION REQUEST: INHALE ONE PUFF BY MOUTH EVERY FOUR HOURS AS NEEDED 25.5 g 3  . sertraline (ZOLOFT) 100 MG tablet NEW PRESCRIPTION REQUEST: TAKE ONE TABLET BY MOUTH DAILY 90 tablet 3  . solifenacin (VESICARE) 5 MG tablet Take 1 tablet (5 mg total) by mouth  daily. For overactive bladder. 30 tablet 0  . SPIRIVA HANDIHALER 18 MCG inhalation capsule NEW PRESCRIPTION REQUEST: INHALE CONTENTS OF ONE CAPSULE VIA HANDIHALER DAILY 90 capsule 3  . spironolactone (ALDACTONE) 25 MG tablet NEW PRESCRIPTION REQUEST: TAKE HALF TABLET BY MOUTH DAILY 45 tablet 3  . isosorbide mononitrate (IMDUR) 30 MG 24 hr tablet NEW PRESCRIPTION REQUEST: TAKE ONE AND ONE-HALF TABLET BY MOUTH DAILY 135 tablet 3  No facility-administered medications prior to visit.    Review of Systems  Constitutional: Negative for chills, fever, malaise/fatigue and weight loss.  HENT: Negative for hearing loss, sore throat and tinnitus.   Eyes: Negative for blurred vision and double vision.  Respiratory: Positive for shortness of breath and wheezing. Negative for cough, hemoptysis, sputum production and stridor.   Cardiovascular: Negative for chest pain, palpitations, orthopnea, leg swelling and PND.  Gastrointestinal: Negative for abdominal pain, constipation, diarrhea, heartburn, nausea and vomiting.  Genitourinary: Negative for dysuria, hematuria and urgency.  Musculoskeletal: Negative for joint pain and myalgias.  Skin: Negative for itching and rash.  Neurological: Negative for dizziness, tingling, weakness and headaches.  Endo/Heme/Allergies: Negative for environmental allergies. Does not bruise/bleed easily.  Psychiatric/Behavioral: Negative for depression. The patient is not nervous/anxious and does not have insomnia.   All other systems reviewed and are negative.    Objective:  Physical Exam Vitals reviewed.  Constitutional:      General: She is not in acute distress.    Appearance: She is well-developed.  HENT:     Head: Normocephalic and atraumatic.     Mouth/Throat:     Mouth: Oropharynx is clear and moist.     Pharynx: No oropharyngeal exudate.  Eyes:     Extraocular Movements: EOM normal.     Conjunctiva/sclera: Conjunctivae normal.     Pupils: Pupils are equal,  round, and reactive to light.  Neck:     Vascular: No JVD.     Trachea: No tracheal deviation.     Comments: Loss of supraclavicular fat Cardiovascular:     Rate and Rhythm: Normal rate and regular rhythm.     Pulses: Intact distal pulses.     Heart sounds: S1 normal and S2 normal.     Comments: Distant heart tones Pulmonary:     Effort: No tachypnea or accessory muscle usage.     Breath sounds: No stridor. Decreased breath sounds (throughout all lung fields) present. No wheezing, rhonchi or rales.     Comments: Increased AP chest diameter Abdominal:     General: Bowel sounds are normal. There is no distension.     Palpations: Abdomen is soft.     Tenderness: There is no abdominal tenderness.  Musculoskeletal:        General: Deformity (muscle wasting ) present. No edema.  Skin:    General: Skin is warm and dry.     Capillary Refill: Capillary refill takes less than 2 seconds.     Findings: No rash.  Neurological:     Mental Status: She is alert and oriented to person, place, and time.  Psychiatric:        Mood and Affect: Mood and affect normal.        Behavior: Behavior normal.      Vitals:   02/15/20 1151  BP: 130/80  Pulse: 72  Temp: 98 F (36.7 C)  TempSrc: Tympanic  SpO2: 97%  Weight: 142 lb 4 oz (64.5 kg)  Height: 4\' 11"  (1.499 m)   97% on 2 L nasal cannula BMI Readings from Last 3 Encounters:  02/15/20 28.73 kg/m  02/05/20 28.68 kg/m  01/04/20 29.21 kg/m   Wt Readings from Last 3 Encounters:  02/15/20 142 lb 4 oz (64.5 kg)  02/05/20 142 lb (64.4 kg)  01/04/20 144 lb 9.6 oz (65.6 kg)     CBC    Component Value Date/Time   WBC 5.7 12/25/2019 1121   RBC 3.07 (L) 12/25/2019 1121  HGB 10.7 (L) 12/25/2019 1121   HGB 12.6 05/22/2019 1102   HCT 32.9 (L) 12/25/2019 1121   HCT 37.6 05/22/2019 1102   PLT 263 12/25/2019 1121   PLT 207 05/22/2019 1102   MCV 107.2 (H) 12/25/2019 1121   MCV 105 (H) 05/22/2019 1102   MCH 34.9 (H) 12/25/2019 1121    MCHC 32.5 12/25/2019 1121   RDW 11.6 12/25/2019 1121   RDW 11.3 (L) 05/22/2019 1102   LYMPHSABS 0.8 12/25/2019 1121   MONOABS 0.5 12/25/2019 1121   EOSABS 0.2 12/25/2019 1121   BASOSABS 0.0 12/25/2019 1121     Chest Imaging: Chest x-ray 02/05/2020: Improved infiltrates within the left base atelectasis in the right middle lobe. Evidence of emphysema The patient's images have been independently reviewed by me.    Pulmonary Functions Testing Results: No flowsheet data found.  FeNO:   Pathology:   Echocardiogram:   Heart Catheterization:     Assessment & Plan:     ICD-10-CM   1. Chronic obstructive pulmonary disease, unspecified COPD type ([place])  J44.9 AMB REFERRAL FOR DME  2. Chronic diastolic CHF (congestive heart failure) (H[facility])  I50.32   3. Pneumonia of both lungs due to infectious organism, unspecified part of lung  J18.9   4. Former smoker  Z87.891   5. Centrilobular emphysema ([place])  J43.2   6. Chronic hypoxemic respiratory failure (HCC)  J96.11     Discussion:  This is an 86 year old female with chronic hypoxemic respiratory failure on nasal cannula O2 supplementation, likely has COPD, have evidence of emphysema on CT imaging and radiograph.  Former smoker.  I do not believe doing PFTs would change management for this patient.  Might be able to prove that she has COPD and able to quantify it better but she likely has this in conjunction with other reasons for hypoxemia.  Plan: Start patient on Trelegy see if this improves some of her symptoms. New prescription for nebulizer and tubing from DME supply New prescription for albuterol to be used as needed. Encourage patient to use nebulizer for she goes to sleep at night.  Hopefully this will help with her nocturnal wheezing symptoms.  Patient to follow-up with [place] in 8 to 10 weeks to see how she is doing with her symptoms.   Current Outpatient Medications:  .  amLODipine (NORVASC) 2.5 MG tablet, NEW PRESCRIPTION  REQUEST: TAKE ONE TABLET BY MOUTH DAILY, Disp: 90 tablet, Rfl: 3 .  aspirin EC 81 MG tablet, Take 81 mg by mouth every morning., Disp: , Rfl:  .  atenolol (TENORMIN) 25 MG tablet, NEW PRESCRIPTION REQUEST: TAKE ONE TABLET BY MOUTH DAILY, Disp: 90 tablet, Rfl: 3 .  atorvastatin (LIPITOR) 80 MG tablet, NEW PRESCRIPTION REQUEST: TAKE ONE TABLET BY MOUTH DAILY, Disp: 90 tablet, Rfl: 3 .  busPIRone (BUSPAR) 7.5 MG tablet, NEW PRESCRIPTION REQUEST: TAKE ONE TABLET BY MOUTH TWICE DAILY, Disp: 180 tablet, Rfl: 3 .  calcium carbonate (OS-CAL) 600 MG TABS tablet, Take 1,200 mg by mouth daily., Disp: , Rfl:  .  cetirizine (ZYRTEC) 10 MG tablet, Take 10 mg by mouth daily., Disp: , Rfl:  .  clopidogrel (PLAVIX) 75 MG tablet, NEW PRESCRIPTION REQUEST: TAKE ONE TABLET BY MOUTH DAILY, Disp: 90 tablet, Rfl: 3 .  famotidine (PEPCID) 20 MG tablet, Take 20 mg by mouth daily. , Disp: , Rfl:  .  fluticasone (FLONASE) 50 MCG/ACT nasal spray, Place 1 spray into both nostrils 2 (two) times daily., Disp: 16 g, Rfl: 0 .  Fluticasone-Salmeterol (ADVAIR) 250-50 MCG/DOSE AEPB, Inhale 1 puff into the lungs in the morning and at bedtime., Disp: 60 each, Rfl: 0 .  gabapentin (NEURONTIN) 100 MG capsule, NEW PRESCRIPTION REQUEST: TAKE ONE CAPSULE BY MOUTH IN THE MORNING AND TAKE TWO CAPSULES BY MOUHT IN THE EVENING, Disp: 270 capsule, Rfl: 3 .  isosorbide mononitrate (IMDUR) 120 MG 24 hr tablet, Take 60 mg by mouth in the morning., Disp: , Rfl:  .  Melatonin 5 MG TABS, Take 5 mg by mouth at bedtime as needed (sleep). , Disp: , Rfl:  .  nitroGLYCERIN (NITROSTAT) 0.4 MG SL tablet, PLACE 1 TABLET UNDER THE TONGUE EVERY 5 MIN AS NEEDED FOR CHEST PAIN (Patient taking differently: Place 0.4 mg under the tongue every 5 (five) minutes as needed for chest pain.), Disp: 25 tablet, Rfl: 11 .  PROAIR HFA 108 (90 Base) MCG/ACT inhaler, NEW PRESCRIPTION REQUEST: INHALE ONE PUFF BY MOUTH EVERY FOUR HOURS AS NEEDED, Disp: 25.5 g, Rfl: 3 .  sertraline  (ZOLOFT) 100 MG tablet, NEW PRESCRIPTION REQUEST: TAKE ONE TABLET BY MOUTH DAILY, Disp: 90 tablet, Rfl: 3 .  solifenacin (VESICARE) 5 MG tablet, Take 1 tablet (5 mg total) by mouth daily. For overactive bladder., Disp: 30 tablet, Rfl: 0 .  SPIRIVA HANDIHALER 18 MCG inhalation capsule, NEW PRESCRIPTION REQUEST: INHALE CONTENTS OF ONE CAPSULE VIA HANDIHALER DAILY, Disp: 90 capsule, Rfl: 3 .  spironolactone (ALDACTONE) 25 MG tablet, NEW PRESCRIPTION REQUEST: TAKE HALF TABLET BY MOUTH DAILY, Disp: 45 tablet, Rfl: 3  I spent 45 minutes dedicated to the care of this patient on the date of this encounter to include pre-visit review of records, face-to-face time with the patient discussing conditions above, post visit ordering of testing, clinical documentation with the electronic health record, making appropriate referrals as documented, and communicating necessary findings to members of the patients care team.    Garner Nash, DO [facility] 02/15/2020 12:12 PM

## 2020-02-20 DIAGNOSIS — J449 Chronic obstructive pulmonary disease, unspecified: Secondary | ICD-10-CM | POA: Diagnosis not present

## 2020-02-26 MED ORDER — TRELEGY ELLIPTA 100-62.5-25 MCG/INH IN AEPB: 1.0000 | INHALATION_SPRAY | Freq: Every day | RESPIRATORY_TRACT | 4 refills | Status: DC

## 2020-02-26 MED ORDER — ALBUTEROL SULFATE (2.5 MG/3ML) 0.083% IN NEBU: 2.5000 mg | INHALATION_SOLUTION | Freq: Four times a day (QID) | RESPIRATORY_TRACT | 5 refills | Status: DC | PRN

## 2020-02-29 ENCOUNTER — Other Ambulatory Visit: Payer: Self-pay | Admitting: Primary Care

## 2020-02-29 ENCOUNTER — Other Ambulatory Visit: Payer: Self-pay

## 2020-02-29 DIAGNOSIS — R32 Unspecified urinary incontinence: Secondary | ICD-10-CM

## 2020-02-29 MED ORDER — SOLIFENACIN SUCCINATE 5 MG PO TABS: 5.0000 mg | ORAL_TABLET | Freq: Every day | ORAL | 0 refills | Status: DC

## 2020-02-29 NOTE — Progress Notes (Signed)
Patients daughter stated that she never received this medication. Prescription re-ordered.

## 2020-02-29 NOTE — Telephone Encounter (Signed)
Called and spoke with patients daughter who stated if she was unsure if patient needed a refill on this medication, but she will check and let us know.

## 2020-03-09 ENCOUNTER — Other Ambulatory Visit: Payer: Self-pay | Admitting: Primary Care

## 2020-03-09 DIAGNOSIS — J9601 Acute respiratory failure with hypoxia: Secondary | ICD-10-CM | POA: Diagnosis not present

## 2020-03-09 DIAGNOSIS — F411 Generalized anxiety disorder: Secondary | ICD-10-CM

## 2020-03-09 DIAGNOSIS — I5032 Chronic diastolic (congestive) heart failure: Secondary | ICD-10-CM | POA: Diagnosis not present

## 2020-03-13 ENCOUNTER — Encounter: Payer: Self-pay | Admitting: Cardiovascular Disease

## 2020-03-13 ENCOUNTER — Ambulatory Visit: Payer: Medicare Other | Admitting: Cardiovascular Disease

## 2020-03-13 ENCOUNTER — Other Ambulatory Visit: Payer: Self-pay

## 2020-03-13 DIAGNOSIS — E782 Mixed hyperlipidemia: Secondary | ICD-10-CM | POA: Diagnosis not present

## 2020-03-13 DIAGNOSIS — I25119 Atherosclerotic heart disease of native coronary artery with unspecified angina pectoris: Secondary | ICD-10-CM

## 2020-03-13 DIAGNOSIS — I10 Essential (primary) hypertension: Secondary | ICD-10-CM | POA: Diagnosis not present

## 2020-03-13 NOTE — Assessment & Plan Note (Signed)
History of mixed hyperlipidemia on statin therapy with lipid profile performed 12/05/2018 revealing a total cholesterol of 118, LDL 51 and HDL of 54.

## 2020-03-13 NOTE — Assessment & Plan Note (Signed)
History of CAD status post cardiac catheterization last performed 08/02/2017 revealing an occluded dominant RCA with left-to-right collaterals, occluded occluded nondominant circumflex with moderate left main disease.  Her last echo performed 06/15/2016 revealed normal LV systolic function.  She denies chest pain.  She is on oxygen 24/7 because of recent community-acquired pneumonia.

## 2020-03-13 NOTE — Patient Instructions (Signed)
Medication Instructions:  Your physician recommends that you continue on your current medications as directed. Please refer to the Current Medication list given to you today.  *If you need a refill on your cardiac medications before your next appointment, please call your pharmacy*   Follow-Up: At [facility], you and your health needs are our priority.  As part of our continuing mission to provide you with exceptional heart care, we have created designated Provider Care Teams.  These Care Teams include your primary Cardiologist (physician) and Advanced Practice Providers (APPs -  Physician Assistants and Nurse Practitioners) who all work together to provide you with the care you need, when you need it.  We recommend signing up for the patient portal called "MyChart".  Sign up information is provided on this After Visit Summary.  MyChart is used to connect with patients for Virtual Visits (Telemedicine).  Patients are able to view lab/test results, encounter notes, upcoming appointments, etc.  Non-urgent messages can be sent to your provider as well.   To learn more about what you can do with MyChart, go to [email or web address].    Your next appointment:   6 month(s)  The format for your next appointment:   In Person  Provider:   You will see one of the following Advanced Practice Providers on your designated Care Team:    Luke Kilroy, PA-C  Callie Goodrich, PA-C  Jesse Cleaver, FNP  Then, Jonathan Berry, MD will plan to see you again in 12 month(s). 

## 2020-03-13 NOTE — Progress Notes (Signed)
03/13/2020 Cindy Brandt   12-23-1931  [id number]  Primary Physician [name], NP Primary Cardiologist: Lorretta Harp MD [name], [place]  HPI:  Cindy Brandt is a 87 y.o.  widowed African-American female mother of 2 childrenhe was accompanied by her daughterMarva . I last saw her in the office  11/28/2019. she has a h/o CAD (CTO of RCA &nonobs LAD/LCX dzs - 2005), HTN, HL, Diast Dysfxn, hypokalemia, and gastritis (EGD 04/2016), who presented on transfer from [facility] for admission after presentation w/ nitrate responsive chest pain and mild troponin elevation. I performed cardiac catheterization on her 06/15/16 revealed an occluded RCA with left right collaterals, occluded nondominant circumflex, high-grade ostial ramus branch stenosis and 50% proximal LAD stenosis. She had normal LV function with grade 2 diastolic dysfunction. She is on optimal medical therapy. She does suffer from anxiety.She has nocturnal chest pain especially when she is recumbent with which sounds more like GERD. She is on an H2 antagonist.  Since I saw her 3 months ago she was admitted to the hospital with Multi lobar pneumonia and was discharged on home oxygen.  She is pretty much housebound.  She complains of some shortness of breath but denies chest pain.   Current Meds  Medication Sig  . albuterol (PROVENTIL) (2.5 MG/3ML) 0.083% nebulizer solution Take 3 mLs (2.5 mg total) by nebulization every 6 (six) hours as needed for wheezing or shortness of breath.  Marland Kitchen amLODipine (NORVASC) 2.5 MG tablet NEW PRESCRIPTION REQUEST: TAKE ONE TABLET BY MOUTH DAILY  . aspirin EC 81 MG tablet Take 81 mg by mouth every morning.  Marland Kitchen atenolol (TENORMIN) 25 MG tablet NEW PRESCRIPTION REQUEST: TAKE ONE TABLET BY MOUTH DAILY  . atorvastatin (LIPITOR) 80 MG tablet NEW PRESCRIPTION REQUEST: TAKE ONE TABLET BY MOUTH DAILY  . busPIRone (BUSPAR) 7.5 MG tablet NEW PRESCRIPTION REQUEST: TAKE ONE TABLET BY MOUTH TWICE DAILY  .  calcium carbonate (OS-CAL) 600 MG TABS tablet Take 1,200 mg by mouth daily.  . cetirizine (ZYRTEC) 10 MG tablet Take 10 mg by mouth daily.  . clopidogrel (PLAVIX) 75 MG tablet NEW PRESCRIPTION REQUEST: TAKE ONE TABLET BY MOUTH DAILY  . famotidine (PEPCID) 20 MG tablet Take 20 mg by mouth daily.   . fluticasone (FLONASE) 50 MCG/ACT nasal spray Place 1 spray into both nostrils 2 (two) times daily.  . Fluticasone-Umeclidin-Vilant (TRELEGY ELLIPTA) 100-62.5-25 MCG/INH AEPB Inhale 1 puff into the lungs daily.  Marland Kitchen gabapentin (NEURONTIN) 100 MG capsule NEW PRESCRIPTION REQUEST: TAKE ONE CAPSULE BY MOUTH IN THE MORNING AND TAKE TWO CAPSULES BY MOUHT IN THE EVENING  . isosorbide mononitrate (IMDUR) 120 MG 24 hr tablet Take 60 mg by mouth in the morning.  . Melatonin 5 MG TABS Take 5 mg by mouth at bedtime as needed (sleep).   . nitroGLYCERIN (NITROSTAT) 0.4 MG SL tablet PLACE 1 TABLET UNDER THE TONGUE EVERY 5 MIN AS NEEDED FOR CHEST PAIN (Patient taking differently: Place 0.4 mg under the tongue every 5 (five) minutes as needed for chest pain.)  . PROAIR HFA 108 (90 Base) MCG/ACT inhaler NEW PRESCRIPTION REQUEST: INHALE ONE PUFF BY MOUTH EVERY FOUR HOURS AS NEEDED  . sertraline (ZOLOFT) 100 MG tablet TAKE 1 TABLET BY MOUTH ONCE DAILY FOR DEPRESSION/ANXIETY.  Marland Kitchen solifenacin (VESICARE) 5 MG tablet Take 1 tablet (5 mg total) by mouth daily. For overactive bladder.  Marland Kitchen spironolactone (ALDACTONE) 25 MG tablet NEW PRESCRIPTION REQUEST: TAKE HALF TABLET BY MOUTH DAILY  Allergies  Allergen Reactions  . Ranexa [Ranolazine] Other (See Comments)    Does NOT "agree" with the patient- does not feel like herself    Social History   Socioeconomic History  . Marital status: Widowed    Spouse name: Not on file  . Number of children: 5  . Years of education: Not on file  . Highest education level: Not on file  Occupational History  . Occupation: Retired  Tobacco Use  . Smoking status: Former Smoker     Packs/day: 0.50    Years: 25.00    Pack years: 12.50    Types: Cigarettes    Quit date: 04/06/1996    Years since quitting: 23.9  . Smokeless tobacco: Never Used  Substance and Sexual Activity  . Alcohol use: No  . Drug use: No  . Sexual activity: Not on file  Other Topics Concern  . Not on file  Social History Narrative   Currently living with dtr in [place].  No regular exercise.   Social Determinants of Health   Financial Resource Strain: Not on file  Food Insecurity: Not on file  Transportation Needs: Not on file  Physical Activity: Not on file  Stress: Not on file  Social Connections: Not on file  Intimate Partner Violence: Not on file     Review of Systems: General: negative for chills, fever, night sweats or weight changes.  Cardiovascular: negative for chest pain, dyspnea on exertion, edema, orthopnea, palpitations, paroxysmal nocturnal dyspnea or shortness of breath Dermatological: negative for rash Respiratory: negative for cough or wheezing Urologic: negative for hematuria Abdominal: negative for nausea, vomiting, diarrhea, bright red blood per rectum, melena, or hematemesis Neurologic: negative for visual changes, syncope, or dizziness All other systems reviewed and are otherwise negative except as noted above.    Blood pressure 116/68, pulse 64, height 5' (1.524 m), weight 142 lb 12.8 oz (64.8 kg), SpO2 98 %.  General appearance: alert and no distress Neck: no adenopathy, no carotid bruit, no JVD, supple, symmetrical, trachea midline and thyroid not enlarged, symmetric, no tenderness/mass/nodules Lungs: clear to auscultation bilaterally Heart: regular rate and rhythm, S1, S2 normal, no murmur, click, rub or gallop Extremities: extremities normal, atraumatic, no cyanosis or edema Pulses: 2+ and symmetric Skin: Skin color, texture, turgor normal. No rashes or lesions Neurologic: Alert and oriented X 3, normal strength and tone. Normal symmetric reflexes. Normal  coordination and gait  EKG not performed today  ASSESSMENT AND PLAN:   Mixed hyperlipidemia History of mixed hyperlipidemia on statin therapy with lipid profile performed 12/05/2018 revealing a total cholesterol of 118, LDL 51 and HDL of 54.  Essential hypertension History of essential hypertension a blood pressure measured today at 116/68.  She is on amlodipine, atenolol.  Coronary artery disease involving native coronary artery of native heart History of CAD status post cardiac catheterization last performed 08/02/2017 revealing an occluded dominant RCA with left-to-right collaterals, occluded occluded nondominant circumflex with moderate left main disease.  Her last echo performed 06/15/2016 revealed normal LV systolic function.  She denies chest pain.  She is on oxygen 24/7 because of recent community-acquired pneumonia.      Lorretta Harp MD FACP,FACC,FAHA, [facility] 03/13/2020 12:08 PM

## 2020-03-13 NOTE — Assessment & Plan Note (Signed)
History of essential hypertension a blood pressure measured today at 116/68.  She is on amlodipine, atenolol.

## 2020-03-19 NOTE — Telephone Encounter (Signed)
Joellen, do you know anything about this [facility]?

## 2020-03-20 NOTE — Telephone Encounter (Signed)
I have done some research and need to make a phone call or two to check this out more. I will let you know what I find out.

## 2020-03-20 NOTE — Telephone Encounter (Signed)
email sent from patient's daughter related to a referral  Dr. Valeta Harms please advise if referral was placed  Good afternoon, I received a phone call from [facility] in [place] saying mother had been referred to them by your office. They seemed legit, but I wanted to make sure before agreeing to let them come out. I think it would benefit her especially if bloodwork, specimen, etc was needed. It's so hard getting her out with a wheelchair and oxygen. [facility] said they would come out once a month to do a check up.  If we needed her to be accessed, we could call and they would send a nurse out. According to them, they would be working with her doctors and she would continue to see her doctors as usual. Please let me know if you made this referral or know about this group. If this makes no sense, feel free to call me at [phone].   Marva

## 2020-03-21 NOTE — Telephone Encounter (Signed)
Information has been provided that [facility] is now working with [facility] to bring home-based services to select high-risk pts. PCP will communicate with pts daughter.

## 2020-03-22 ENCOUNTER — Other Ambulatory Visit: Payer: Self-pay | Admitting: Cardiology

## 2020-03-22 DIAGNOSIS — I25118 Atherosclerotic heart disease of native coronary artery with other forms of angina pectoris: Secondary | ICD-10-CM

## 2020-03-23 ENCOUNTER — Other Ambulatory Visit: Payer: Self-pay | Admitting: Primary Care

## 2020-03-23 ENCOUNTER — Other Ambulatory Visit: Payer: Self-pay | Admitting: Cardiovascular Disease

## 2020-03-23 DIAGNOSIS — M79672 Pain in left foot: Secondary | ICD-10-CM

## 2020-03-23 DIAGNOSIS — K219 Gastro-esophageal reflux disease without esophagitis: Secondary | ICD-10-CM

## 2020-03-23 DIAGNOSIS — M79671 Pain in right foot: Secondary | ICD-10-CM

## 2020-03-23 DIAGNOSIS — M159 Polyosteoarthritis, unspecified: Secondary | ICD-10-CM

## 2020-03-23 DIAGNOSIS — R32 Unspecified urinary incontinence: Secondary | ICD-10-CM

## 2020-03-23 DIAGNOSIS — H938X2 Other specified disorders of left ear: Secondary | ICD-10-CM

## 2020-03-25 NOTE — Telephone Encounter (Signed)
We need to find out how she's doing on the solifenacin (Vesicare) 5 mg for bladder incontinence. Any improvement? If improved then okay to send refill as pending. If not then do not sent refills.   Also, was her pantoprazole discontinued?

## 2020-03-27 NOTE — Telephone Encounter (Signed)
It appears that the pharmacy has requested Protonix 40 mg, but this order has been discontinued.  As far as the prescription for Solifenacin 5 mg, this RN called and spoke with the patients daughter (ok per DPR) who stated that the medication didn't seem to be helping a lot and agreed that no further refills were needed.   Pharmacy requests refill on: Fluticasone 50 mcg/act  LAST REFILL: 02/05/2020 (Q-16 g, R-0) LAST OV: 02/05/2020 NEXT OV: 04/01/2020 PHARMACY: [id number] [place], [place] requests refill on: Gabapentin 100 mg   LAST REFILL: 01/19/2020 (Q-270, R-3) LAST OV: 02/05/2020 NEXT OV: 04/01/2020 PHARMACY: [id number] [place], [place]  *TOO EARLY FOR REFILL*

## 2020-03-28 NOTE — Telephone Encounter (Signed)
Cindy Brandt, it looks like you discontinued her pantoprazole in October 2021, is there a particular reason? If not, then send refills.

## 2020-03-28 NOTE — Telephone Encounter (Signed)
At last med reconciliation patient was no longer taking. Only taking Pepcid. I will call and verify with her tomorrow and make any adjustments needed.

## 2020-03-29 NOTE — Telephone Encounter (Signed)
Left message to return call to our office.  

## 2020-03-31 NOTE — Progress Notes (Signed)
    Raiden Yearwood T. Lorali Khamis, MD, [place]  Primary Care and Sports Medicine [facility] [place], [address]  Phone: [phone]  FAX: [phone]  Cindy Brandt - 87 y.o. female  MRN [id number]  Date of Birth: Jul 18, 1931  Date: 04/01/2020  PCP: [name], NP  Referral: [name], NP  Chief Complaint  Patient presents with  . Knee Pain    Bilateral but left knee is worse    This visit occurred during the SARS-CoV-2 public health emergency.  Safety protocols were in place, including screening questions prior to the visit, additional usage of staff PPE, and extensive cleaning of exam room while observing appropriate contact time as indicated for disinfecting solutions.   Subjective:   Cindy Brandt is a 87 y.o. very pleasant female patient with Body mass index is 27.78 kg/m. who presents with the following:  I remember this very nice lady quite well from her office visit on September 14, 2019.  She has notable arthritis, but she also had some evidence of OCD with at least 1 loose body.  I had a reasonably long conversation with the patient and her daughter at that point.  At that point in time, she had no interest in any kind of surgical intervention.  She did have some fairly dramatic loss of motion.  At that point I did do a therapeutic injection of Depo-Medrol in the patient's knee, and she is here today in follow-up regarding her knee pain.  She is having a flare-up right now.  Aspiration/Injection Procedure Note [name] 10-Oct-1931 Date of procedure: 04/01/2020  Procedure: Large Joint Aspiration / Injection of Knee, L Indications: Pain  Procedure Details Patient verbally consented to procedure. Risks, benefits, and alternatives explained. Sterilely prepped with Chloraprep. Ethyl cholride used for anesthesia. 9 cc Lidocaine 1% mixed with 1 mL of Kenalog 40 mg injected using the anteromedial approach  without difficulty. No complications with procedure and tolerated well. Patient had decreased pain post-injection. Medication: 1 mL of Kenalog 40 mg   Signed,  Caraline Deutschman T. Mckenzie Bove, MD

## 2020-04-01 ENCOUNTER — Ambulatory Visit (INDEPENDENT_AMBULATORY_CARE_PROVIDER_SITE_OTHER): Payer: Medicare Other | Admitting: Family Medicine

## 2020-04-01 ENCOUNTER — Other Ambulatory Visit: Payer: Self-pay

## 2020-04-01 ENCOUNTER — Encounter: Payer: Self-pay | Admitting: Family Medicine

## 2020-04-01 VITALS — BP 120/80 | HR 71 | Temp 98.1°F | Ht 60.0 in | Wt 142.2 lb

## 2020-04-01 DIAGNOSIS — M1712 Unilateral primary osteoarthritis, left knee: Secondary | ICD-10-CM

## 2020-04-01 DIAGNOSIS — Z7902 Long term (current) use of antithrombotics/antiplatelets: Secondary | ICD-10-CM

## 2020-04-01 DIAGNOSIS — M958 Other specified acquired deformities of musculoskeletal system: Secondary | ICD-10-CM

## 2020-04-01 DIAGNOSIS — M2342 Loose body in knee, left knee: Secondary | ICD-10-CM

## 2020-04-01 MED ORDER — TRIAMCINOLONE ACETONIDE 40 MG/ML IJ SUSP
40.0000 mg | Freq: Once | INTRAMUSCULAR | Status: AC
  Administered 2020-04-01: 40 mg via INTRA_ARTICULAR

## 2020-04-01 MED ORDER — TRIAMCINOLONE ACETONIDE 40 MG/ML IJ SUSP: 40.0000 mg | Freq: Once | INTRAMUSCULAR | Status: DC

## 2020-04-01 NOTE — Addendum Note (Signed)
Addended by: Carter Kitten on: 04/01/2020 02:23 PM   Modules accepted: Orders

## 2020-04-01 NOTE — Addendum Note (Signed)
Addended by: Carter Kitten on: 04/01/2020 02:20 PM   Modules accepted: Orders

## 2020-04-08 ENCOUNTER — Other Ambulatory Visit: Payer: Self-pay | Admitting: Primary Care

## 2020-04-08 DIAGNOSIS — R32 Unspecified urinary incontinence: Secondary | ICD-10-CM

## 2020-04-08 NOTE — Telephone Encounter (Signed)
Cindy Brandt, I believe that you spoke with her daughter and they stated that the Vesicare was not effective for her incontinence? Are they actually wanting a refill?

## 2020-04-09 DIAGNOSIS — I5032 Chronic diastolic (congestive) heart failure: Secondary | ICD-10-CM | POA: Diagnosis not present

## 2020-04-09 DIAGNOSIS — J9601 Acute respiratory failure with hypoxia: Secondary | ICD-10-CM | POA: Diagnosis not present

## 2020-04-09 NOTE — Telephone Encounter (Signed)
Called and spoke to daughter that has not been at last few appointments. Thinks that other sister was confused. States patient is taking both Protonix and Pepcid. Would like refill sent in.

## 2020-04-09 NOTE — Telephone Encounter (Signed)
Refills sent to pharmacy. 

## 2020-04-09 NOTE — Telephone Encounter (Signed)
Called and spoke to daughter that has not been at last few appointments. Thinks that other sister was confused. States that mom tells them it is not working but then when she stops taking she can tell it was. Would like to continue on and get refills.

## 2020-04-09 NOTE — Telephone Encounter (Signed)
Noted, refills sent to pharmacy. 

## 2020-04-22 DIAGNOSIS — E782 Mixed hyperlipidemia: Secondary | ICD-10-CM | POA: Diagnosis not present

## 2020-04-22 DIAGNOSIS — I5032 Chronic diastolic (congestive) heart failure: Secondary | ICD-10-CM | POA: Diagnosis not present

## 2020-04-22 DIAGNOSIS — D649 Anemia, unspecified: Secondary | ICD-10-CM | POA: Diagnosis not present

## 2020-04-22 DIAGNOSIS — I10 Essential (primary) hypertension: Secondary | ICD-10-CM | POA: Diagnosis not present

## 2020-04-22 LAB — CBC AND DIFFERENTIAL
HCT: 34 — AB (ref 36–46)
Hemoglobin: 11.4 — AB (ref 12.0–16.0)
Platelets: 161 (ref 150–399)
WBC: 3.6

## 2020-04-22 LAB — BASIC METABOLIC PANEL
CO2: 32 — AB (ref 13–22)
Chloride: 102 (ref 99–108)
Creatinine: 0.9 (ref 0.5–1.1)
Glucose: 163
Potassium: 4.2 (ref 3.4–5.3)
Sodium: 138 (ref 137–147)

## 2020-04-22 LAB — COMPREHENSIVE METABOLIC PANEL
Albumin: 3.8 (ref 3.5–5.0)
Calcium: 9.3 (ref 8.7–10.7)
GFR calc Af Amer: 65
GFR calc non Af Amer: 56
Globulin: 2.6

## 2020-04-22 LAB — LIPID PANEL
Cholesterol: 122 (ref 0–200)
HDL: 46 (ref 35–70)
LDL Cholesterol: 56
Triglycerides: 122 (ref 40–160)

## 2020-04-22 LAB — HEPATIC FUNCTION PANEL
ALT: 10 (ref 7–35)
AST: 14 (ref 13–35)
Alkaline Phosphatase: 34 (ref 25–125)

## 2020-04-22 LAB — CBC: RBC: 3.25 — AB (ref 3.87–5.11)

## 2020-04-25 ENCOUNTER — Ambulatory Visit: Payer: Medicare Other | Admitting: Pulmonary Disease

## 2020-04-30 ENCOUNTER — Encounter: Payer: Self-pay | Admitting: Primary Care

## 2020-05-07 DIAGNOSIS — J9601 Acute respiratory failure with hypoxia: Secondary | ICD-10-CM | POA: Diagnosis not present

## 2020-05-07 DIAGNOSIS — I5032 Chronic diastolic (congestive) heart failure: Secondary | ICD-10-CM | POA: Diagnosis not present

## 2020-05-10 ENCOUNTER — Ambulatory Visit (INDEPENDENT_AMBULATORY_CARE_PROVIDER_SITE_OTHER): Payer: Medicare Other | Admitting: Primary Care

## 2020-05-10 ENCOUNTER — Other Ambulatory Visit: Payer: Self-pay

## 2020-05-10 VITALS — BP 122/70 | HR 52 | Temp 97.4°F | Ht 60.0 in | Wt 142.5 lb

## 2020-05-10 DIAGNOSIS — R413 Other amnesia: Secondary | ICD-10-CM | POA: Diagnosis not present

## 2020-05-10 DIAGNOSIS — J449 Chronic obstructive pulmonary disease, unspecified: Secondary | ICD-10-CM

## 2020-05-10 LAB — CBC WITH DIFFERENTIAL/PLATELET
Basophils Absolute: 0 10*3/uL (ref 0.0–0.1)
Basophils Relative: 0.4 % (ref 0.0–3.0)
Eosinophils Absolute: 0.1 10*3/uL (ref 0.0–0.7)
Eosinophils Relative: 3.8 % (ref 0.0–5.0)
HCT: 35.4 % — ABNORMAL LOW (ref 36.0–46.0)
Hemoglobin: 12 g/dL (ref 12.0–15.0)
Lymphocytes Relative: 28.1 % (ref 12.0–46.0)
Lymphs Abs: 1.1 10*3/uL (ref 0.7–4.0)
MCHC: 34 g/dL (ref 30.0–36.0)
MCV: 104.8 fl — ABNORMAL HIGH (ref 78.0–100.0)
Monocytes Absolute: 0.4 10*3/uL (ref 0.1–1.0)
Monocytes Relative: 9.4 % (ref 3.0–12.0)
Neutro Abs: 2.2 10*3/uL (ref 1.4–7.7)
Neutrophils Relative %: 58.3 % (ref 43.0–77.0)
Platelets: 195 10*3/uL (ref 150.0–400.0)
RBC: 3.38 Mil/uL — ABNORMAL LOW (ref 3.87–5.11)
RDW: 12.6 % (ref 11.5–15.5)
WBC: 3.8 10*3/uL — ABNORMAL LOW (ref 4.0–10.5)

## 2020-05-10 LAB — BASIC METABOLIC PANEL
BUN: 25 mg/dL — ABNORMAL HIGH (ref 6–23)
CO2: 31 mEq/L (ref 19–32)
Calcium: 9.7 mg/dL (ref 8.4–10.5)
Chloride: 101 mEq/L (ref 96–112)
Creatinine, Ser: 1.06 mg/dL (ref 0.40–1.20)
GFR: 47.5 mL/min — ABNORMAL LOW (ref >60.00)
Glucose, Bld: 140 mg/dL — ABNORMAL HIGH (ref 70–99)
Potassium: 4.5 mEq/L (ref 3.5–5.1)
Sodium: 137 mEq/L (ref 135–145)

## 2020-05-10 LAB — VITAMIN B12: Vitamin B-12: 573 pg/mL (ref 211–911)

## 2020-05-10 NOTE — Assessment & Plan Note (Signed)
Chronic and ongoing, progressing slowly.  No abrupt changes per family.  Today she appears well, no acute illness suspected. Suspect memory changes could be from chronic vascular disease and smoking history.  Strongly encouraged patient to refrain from watching TV all day during the day, encouraged reading/puzzles, etc.  We also discussed memory medications, but we all agreed that adding another pill to her regimen was too risky for interactions.   Checking labs today including BMP, CBC, B12.  Daughter will update with any abrupt changes.

## 2020-05-10 NOTE — Patient Instructions (Signed)
Stop by the lab prior to leaving today. I will notify you of your results once received.   Avoid watching TV all day. Try to read, work puzzles, etc in order to get your mind engaged.   Use your walker at all times when in the house.  It was a pleasure to see you today!

## 2020-05-10 NOTE — Progress Notes (Signed)
Subjective:    Patient ID: Cindy Brandt, female    DOB: [birth year], 87 y.o.   MRN: [id number]  HPI  Cindy Brandt is a very pleasant 87 y.o. female with a history of hypertension, CAD, NSTEMI, COPD, acute respiratory failure with hypoxia, CAP, prediabetes, hyperlipidemia who presents today with her family to discuss memory loss.  Her family is concerned about confusion, difficulty remembering, patient is forgetting to take medications. Her family has noticed these symptoms gradually over the last 1-2 years, progressing over the last few months. No abrupt changes in memory.   The patient admits difficulty remembering if she's taken her daily medications or not. She is supposed to wear her oxygen continuously, but doesn't do this, her daughter finds her nasal cannula sitting on the bridge of her nose.   She is sedentary for most of her day, watches TV. She doesn't ambulate much given her weakness and history of falling. She last fell about two weeks ago. She doesn't use her walker at home as she cannot fit it into her bedroom, her daughter has tried to get her to remove some cluttered furniture. She denies increased dyspnea and cough. She is compliant to Trelegy Ellipta daily  She lives with her daughter, but is by herself during the day when her daughter is at work. Her family is considering getting someone to come out for a few hours daily to ensure she takes her medications and to check on her.   BP Readings from Last 3 Encounters:  05/10/20 122/70  04/01/20 120/80  03/13/20 116/68      Review of Systems  Constitutional: Negative for fever.  Respiratory:       Chronic dyspnea, no worse than usual. Chronic cough, no more than usual.  Cardiovascular: Negative for chest pain.  Genitourinary: Negative for dysuria.  Neurological: Negative for dizziness.         Past Medical History:  Diagnosis Date  . Allergy   . Anemia   . Anxiety   . Arthritis   . Blood transfusion without  reported diagnosis   . Cataract    a. bilerteral cataracts removed  . Coronary atherosclerosis of native coronary artery    a. 2003 Cath/unsuccessful PCI of occluded RCA;  b. 2005 NSTEMI/Cath: CTO RCA w/ L->R collats, LAD 20, LCX 91m, nl EF;  c. 11/2011 Myoview: no ischemia.  . Diastolic dysfunction 29/79/8921   a. 11/2011 Echo: Ejection fraction 60-65%, gr1 DD, basal inferior AK;  b. 09/2013 Echo: EF 55-60%, mild LVH, no rwma, Gr1 DD, mildly dil LA.  . Gastritis    a. 04/2016 EGD: Nl esophagus, gastritis, nl duodenal bulb & 2nd portion of the duodenum.  Marland Kitchen Headache(784.0)   . Hyperlipidemia   . Hypertension   . Hypokalemia   . Tubular adenoma of colon 2013    Social History   Socioeconomic History  . Marital status: Widowed    Spouse name: Not on file  . Number of children: 5  . Years of education: Not on file  . Highest education level: Not on file  Occupational History  . Occupation: Retired  Tobacco Use  . Smoking status: Former Smoker    Packs/day: 0.50    Years: 25.00    Pack years: 12.50    Types: Cigarettes    Quit date: 04/06/1996    Years since quitting: 24.1  . Smokeless tobacco: Never Used  Substance and Sexual Activity  . Alcohol use: No  . Drug use: No  .  Sexual activity: Not on file  Other Topics Concern  . Not on file  Social History Narrative   Currently living with dtr in [place].  No regular exercise.   Social Determinants of Health   Financial Resource Strain: Not on file  Food Insecurity: Not on file  Transportation Needs: Not on file  Physical Activity: Not on file  Stress: Not on file  Social Connections: Not on file  Intimate Partner Violence: Not on file    Past Surgical History:  Procedure Laterality Date  . CARPAL TUNNEL RELEASE Bilateral   . CHOLECYSTECTOMY  2004  . COLONOSCOPY  08/06/2011   Procedure: COLONOSCOPY;  Surgeon: [name], MD;  Location: [facility];  Service: Endoscopy;  Laterality: N/A;  10:40 AM  . CORONARY ANGIOGRAPHY  N/A 08/02/2017   Procedure: CORONARY ANGIOGRAPHY;  Surgeon: Lorretta Harp, MD;  Location: [place] [facility];  Service: Cardiovascular;  Laterality: N/A;  . GALLBLADDER SURGERY    . LEFT HEART CATH AND CORONARY ANGIOGRAPHY N/A 06/15/2016   Procedure: Left Heart Cath and Coronary Angiography;  Surgeon: Lorretta Harp, MD;  Location: [place] [facility];  Service: Cardiovascular;  Laterality: N/A;  . PARTIAL HYSTERECTOMY    . repair of belly button    . UMBILICAL HERNIA REPAIR     Umbilical hernia repair as a child    Family History  Problem Relation Age of Onset  . Coronary artery disease Father   . Heart attack Father   . Colon cancer Father   . Coronary artery disease Mother   . Stomach cancer Mother   . Heart disease Brother   . Diabetes Brother   . Pancreatic cancer Neg Hx   . Rectal cancer Neg Hx   . Esophageal cancer Neg Hx     Allergies  Allergen Reactions  . Ranexa [Ranolazine] Other (See Comments)    Does NOT "agree" with the patient- does not feel like herself    Current Outpatient Medications on File Prior to Visit  Medication Sig Dispense Refill  . albuterol (PROVENTIL) (2.5 MG/3ML) 0.083% nebulizer solution Take 3 mLs (2.5 mg total) by nebulization every 6 (six) hours as needed for wheezing or shortness of breath. 360 mL 5  . amLODipine (NORVASC) 2.5 MG tablet TAKE 1 TABLET BY MOUTH EVERY DAY 90 tablet 1  . aspirin EC 81 MG tablet Take 81 mg by mouth every morning.    Marland Kitchen atenolol (TENORMIN) 25 MG tablet TAKE 1 TABLET BY MOUTH EVERY DAY 90 tablet 2  . atorvastatin (LIPITOR) 80 MG tablet NEW PRESCRIPTION REQUEST: TAKE ONE TABLET BY MOUTH DAILY 90 tablet 3  . busPIRone (BUSPAR) 7.5 MG tablet NEW PRESCRIPTION REQUEST: TAKE ONE TABLET BY MOUTH TWICE DAILY 180 tablet 3  . calcium carbonate (OS-CAL) 600 MG TABS tablet Take 1,200 mg by mouth daily.    . cetirizine (ZYRTEC) 10 MG tablet Take 10 mg by mouth daily.    . clopidogrel (PLAVIX) 75 MG tablet NEW PRESCRIPTION  REQUEST: TAKE ONE TABLET BY MOUTH DAILY 90 tablet 3  . famotidine (PEPCID) 20 MG tablet Take 20 mg by mouth daily.     . fluticasone (FLONASE) 50 MCG/ACT nasal spray PLACE 1 SPRAY INTO BOTH NOSTRILS 2 (TWO) TIMES DAILY 16 mL 5  . Fluticasone-Umeclidin-Vilant (TRELEGY ELLIPTA) 100-62.5-25 MCG/INH AEPB Inhale 1 puff into the lungs daily. 60 each 4  . gabapentin (NEURONTIN) 100 MG capsule NEW PRESCRIPTION REQUEST: TAKE ONE CAPSULE BY MOUTH IN THE MORNING AND TAKE  TWO CAPSULES BY MOUHT IN THE EVENING 270 capsule 3  . isosorbide mononitrate (IMDUR) 120 MG 24 hr tablet Take 60 mg by mouth in the morning.    . Melatonin 5 MG TABS Take 5 mg by mouth at bedtime as needed (sleep).     . nitroGLYCERIN (NITROSTAT) 0.4 MG SL tablet PLACE 1 TABLET UNDER THE TONGUE EVERY 5 MIN AS NEEDED FOR CHEST PAIN 25 tablet 11  . pantoprazole (PROTONIX) 40 MG tablet TAKE 1 TABLET BY MOUTH ONCE DAILY FOR HEARTBURN. 90 tablet 3  . PROAIR HFA 108 (90 Base) MCG/ACT inhaler NEW PRESCRIPTION REQUEST: INHALE ONE PUFF BY MOUTH EVERY FOUR HOURS AS NEEDED 25.5 g 3  . sertraline (ZOLOFT) 100 MG tablet TAKE 1 TABLET BY MOUTH ONCE DAILY FOR DEPRESSION/ANXIETY. 90 tablet 1  . solifenacin (VESICARE) 5 MG tablet TAKE 1 TABLET (5 MG TOTAL) BY MOUTH DAILY. FOR OVERACTIVE BLADDER. 90 tablet 3  . spironolactone (ALDACTONE) 25 MG tablet NEW PRESCRIPTION REQUEST: TAKE HALF TABLET BY MOUTH DAILY 45 tablet 3   No current facility-administered medications on file prior to visit.    There were no vitals taken for this visit. Objective:   Physical Exam Cardiovascular:     Rate and Rhythm: Normal rate and regular rhythm.  Pulmonary:     Effort: Pulmonary effort is normal.     Breath sounds: Normal breath sounds.  Musculoskeletal:     Cervical back: Neck supple.  Skin:    General: Skin is warm and dry.  Neurological:     Mental Status: She is alert and oriented to person, place, and time.     Comments: Answering questions appropriately             Assessment & Plan:      This visit occurred during the SARS-CoV-2 public health emergency.  Safety protocols were in place, including screening questions prior to the visit, additional usage of staff PPE, and extensive cleaning of exam room while observing appropriate contact time as indicated for disinfecting solutions.

## 2020-05-10 NOTE — Assessment & Plan Note (Signed)
Compliant to Trelegy daily, using albuterol nebulizer HS. Exam today without evidence of abnormality or acute exacerbation.  Continue current regimen.

## 2020-05-23 DIAGNOSIS — J449 Chronic obstructive pulmonary disease, unspecified: Secondary | ICD-10-CM | POA: Diagnosis not present

## 2020-06-07 DIAGNOSIS — J9601 Acute respiratory failure with hypoxia: Secondary | ICD-10-CM | POA: Diagnosis not present

## 2020-06-07 DIAGNOSIS — I5032 Chronic diastolic (congestive) heart failure: Secondary | ICD-10-CM | POA: Diagnosis not present

## 2020-06-10 ENCOUNTER — Telehealth: Payer: Self-pay

## 2020-06-10 NOTE — Telephone Encounter (Signed)
Lattie Haw, RN called and stated that she had just visited the patient and the daughter expressed concerns regarding patient falling asleep during the day and even during conversations. RN and patients daughter wanted to make Allie Bossier, NP aware. Please advise.

## 2020-06-11 NOTE — Telephone Encounter (Signed)
Please thank Lattie Haw for the update.  She's on gabapentin 100 mg in the morning which can cause drowsiness, would recommend she stop this to see if that helps. She can still take the 200 mg HS if needed for pain.   Also, sertraline (Zoloft) can cause drowsiness in some people, when is she taking this medication? I recommend HS.

## 2020-06-11 NOTE — Telephone Encounter (Signed)
Just to clarify, we did tell patient's daughter that she could take the 200 mg HS, correct? We could have her try 300 mg HS to see if this helps with sleep and pain, no 100 mg in AM.

## 2020-06-11 NOTE — Telephone Encounter (Signed)
Pt Daughter stated that Gabapentin is the only thing that help with the pain in her mom legs, but stated she is willing to give it a try to help with the drowsiness. She said her mom takes sertraline at night, but mom states she does not sleep well at night that is why she is sleeping so much during the day.  Pt is taking gabapentin  100 mg in the morning and 200mg  at night

## 2020-06-12 NOTE — Telephone Encounter (Signed)
Called and spoke to [facility]. Will have patient take three at bedtime and none in am and let our office know how she is doing in a few days.

## 2020-07-26 ENCOUNTER — Telehealth: Payer: Self-pay | Admitting: *Deleted

## 2020-07-26 NOTE — Chronic Care Management (AMB) (Signed)
  Chronic Care Management   Outreach Note  07/26/2020 Name: Cindy Brandt MRN: [id number] DOB: 1931/12/23  Cindy Brandt is a 87 y.o. year old female who is a primary care patient of [name], NP. I reached out to Cindy Brandt by phone today in response to a referral sent by Ms. Cindy Brandt's PCP, [name], NP.     An unsuccessful telephone outreach was attempted today. The patient was referred to the case management team for assistance with care management and care coordination.   Follow Up Plan: A HIPAA compliant phone message was left for the patient providing contact information and requesting a return call. The care management team will reach out to the patient again over the next 7 days. If patient returns call to provider office, please advise to call [facility] at [phone].  [facility]

## 2020-08-02 NOTE — Chronic Care Management (AMB) (Signed)
  Chronic Care Management   Note  08/02/2020 Name: NAKYLA BRACCO MRN: [id number] DOB: 1931-04-08  Cindy Brandt is a 87 y.o. year old female who is a primary care patient of [name], NP. I reached out to Cindy Brandt by phone today in response to a referral sent by [name]'s PCP, [name], NP.     [name] was given information about Chronic Care Management services today including:  1. CCM service includes personalized support from designated clinical staff supervised by her physician, including individualized plan of care and coordination with other care providers 2. 24/7 contact phone numbers for assistance for urgent and routine care needs. 3. Service will only be billed when office clinical staff spend 20 minutes or more in a month to coordinate care. 4. Only one practitioner may furnish and bill the service in a calendar month. 5. The patient may stop CCM services at any time (effective at the end of the month) by phone call to the office staff. 6. The patient will be responsible for cost sharing (co-pay) of up to 20% of the service fee (after annual deductible is met).  Daugher [name] DPR on file verbally agreed to assistance and services provided by embedded care coordination/care management team today.  Follow up plan: Telephone appointment with care management team member scheduled for:08/06/2020  [facility]

## 2020-08-06 ENCOUNTER — Ambulatory Visit (INDEPENDENT_AMBULATORY_CARE_PROVIDER_SITE_OTHER): Payer: Medicare Other

## 2020-08-06 DIAGNOSIS — R413 Other amnesia: Secondary | ICD-10-CM

## 2020-08-06 DIAGNOSIS — J449 Chronic obstructive pulmonary disease, unspecified: Secondary | ICD-10-CM | POA: Diagnosis not present

## 2020-08-06 DIAGNOSIS — R296 Repeated falls: Secondary | ICD-10-CM

## 2020-08-06 NOTE — Chronic Care Management (AMB) (Signed)
Chronic Care Management   CCM RN Visit Note  08/07/2020 Name: Cindy Brandt MRN: [id number] DOB: 29-Mar-1931  Subjective: Cindy Brandt is a 87 y.o. year old female who is a primary care patient of [name], NP. The care management team was consulted for assistance with disease management and care coordination needs.    Engaged with patient by telephone for initial visit in response to provider referral for case management and/or care coordination services.   Consent to Services:  The patient was given the following information about Chronic Care Management services today, agreed to services, and gave verbal consent: 1. CCM service includes personalized support from designated clinical staff supervised by the primary care provider, including individualized plan of care and coordination with other care providers 2. 24/7 contact phone numbers for assistance for urgent and routine care needs. 3. Service will only be billed when office clinical staff spend 20 minutes or more in a month to coordinate care. 4. Only one practitioner may furnish and bill the service in a calendar month. 5.The patient may stop CCM services at any time (effective at the end of the month) by phone call to the office staff. 6. The patient will be responsible for cost sharing (co-pay) of up to 20% of the service fee (after annual deductible is met). Patient agreed to services and consent obtained.  Patient agreed to services and verbal consent obtained.   Assessment: Review of patient past medical history, allergies, medications, health status, including review of consultants reports, laboratory and other test data, was performed as part of comprehensive evaluation and provision of chronic care management services.   SDOH (Social Determinants of Health) assessments and interventions performed:  SDOH Interventions   Flowsheet Row Most Recent Value  SDOH Interventions   Food Insecurity Interventions Intervention Not  Indicated  Financial Strain Interventions Intervention Not Indicated  Housing Interventions Intervention Not Indicated  Transportation Interventions Intervention Not Indicated       CCM Care Plan  Allergies  Allergen Reactions  . Ranexa [Ranolazine] Other (See Comments)    Does NOT "agree" with the patient- does not feel like herself    Outpatient Encounter Medications as of 08/06/2020  Medication Sig Note  . albuterol (PROVENTIL) (2.5 MG/3ML) 0.083% nebulizer solution Take 3 mLs (2.5 mg total) by nebulization every 6 (six) hours as needed for wheezing or shortness of breath.   Marland Kitchen amLODipine (NORVASC) 2.5 MG tablet TAKE 1 TABLET BY MOUTH EVERY DAY   . aspirin EC 81 MG tablet Take 81 mg by mouth every morning.   Marland Kitchen atenolol (TENORMIN) 25 MG tablet TAKE 1 TABLET BY MOUTH EVERY DAY   . atorvastatin (LIPITOR) 80 MG tablet NEW PRESCRIPTION REQUEST: TAKE ONE TABLET BY MOUTH DAILY   . busPIRone (BUSPAR) 7.5 MG tablet NEW PRESCRIPTION REQUEST: TAKE ONE TABLET BY MOUTH TWICE DAILY   . calcium carbonate (OS-CAL) 600 MG TABS tablet Take 1,200 mg by mouth daily.   . cetirizine (ZYRTEC) 10 MG tablet Take 10 mg by mouth daily.   . clopidogrel (PLAVIX) 75 MG tablet NEW PRESCRIPTION REQUEST: TAKE ONE TABLET BY MOUTH DAILY   . famotidine (PEPCID) 20 MG tablet Take 20 mg by mouth daily.    . fluticasone (FLONASE) 50 MCG/ACT nasal spray PLACE 1 SPRAY INTO BOTH NOSTRILS 2 (TWO) TIMES DAILY   . Fluticasone-Umeclidin-Vilant (TRELEGY ELLIPTA) 100-62.5-25 MCG/INH AEPB Inhale 1 puff into the lungs daily.   Marland Kitchen gabapentin (NEURONTIN) 100 MG capsule NEW PRESCRIPTION REQUEST: TAKE ONE CAPSULE  BY MOUTH IN THE MORNING AND TAKE TWO CAPSULES BY MOUHT IN THE EVENING   . isosorbide mononitrate (IMDUR) 120 MG 24 hr tablet Take 60 mg by mouth in the morning. 12/23/2019: 0.5 tablet = 60 mg  . Melatonin 5 MG TABS Take 5 mg by mouth at bedtime as needed (sleep).    . nitroGLYCERIN (NITROSTAT) 0.4 MG SL tablet PLACE 1 TABLET  UNDER THE TONGUE EVERY 5 MIN AS NEEDED FOR CHEST PAIN   . pantoprazole (PROTONIX) 40 MG tablet TAKE 1 TABLET BY MOUTH ONCE DAILY FOR HEARTBURN.   Marland Kitchen PROAIR HFA 108 (90 Base) MCG/ACT inhaler NEW PRESCRIPTION REQUEST: INHALE ONE PUFF BY MOUTH EVERY FOUR HOURS AS NEEDED   . sertraline (ZOLOFT) 100 MG tablet TAKE 1 TABLET BY MOUTH ONCE DAILY FOR DEPRESSION/ANXIETY.   Marland Kitchen solifenacin (VESICARE) 5 MG tablet TAKE 1 TABLET (5 MG TOTAL) BY MOUTH DAILY. FOR OVERACTIVE BLADDER.   Marland Kitchen spironolactone (ALDACTONE) 25 MG tablet NEW PRESCRIPTION REQUEST: TAKE HALF TABLET BY MOUTH DAILY    No facility-administered encounter medications on file as of 08/06/2020.    Patient Active Problem List   Diagnosis Date Noted  . Memory changes 05/10/2020  . Chronic obstructive pulmonary disease ([place]) 02/05/2020  . Sensation of fullness in left ear 02/05/2020  . Pain in both feet 01/04/2020  . Acute respiratory failure with hypoxia ([place]) 12/23/2019  . Pneumonia of both lungs due to infectious organism 12/23/2019  . Chronic diastolic CHF (congestive heart failure) ([place]) 12/23/2019  . Sepsis ([place]) 12/23/2019  . Left bundle branch block 12/23/2019  . Cough 10/27/2019  . Pain of right lower extremity 06/26/2019  . Dizziness 06/26/2019  . Herpes zoster without complication 37/85/8850  . Angular cheilitis 05/04/2018  . Hemorrhoids 04/11/2018  . Lower abdominal pain 04/11/2018  . Osteoporosis 11/15/2017  . Prediabetes 11/15/2017  . Presbycusis of both ears 11/16/2016  . Anemia 06/20/2016  . Dyspnea   . NSTEMI (non-ST elevated myocardial infarction) ([place]) 06/14/2016  . Unstable angina ([place]) 06/14/2016  . Hx of adenomatous colonic polyps 04/06/2016  . GERD without esophagitis 10/29/2015  . IBS (irritable bowel syndrome) 10/29/2015  . Urinary incontinence 10/29/2015  . Osteoarthritis 10/29/2015  . Diastolic dysfunction 27/74/1287  . Chest pain 11/23/2011  . Chronic pain of left knee 01/08/2011  . HYPOKALEMIA 07/31/2009   . Mixed hyperlipidemia 05/22/2009  . Anxiety and depression 05/22/2009  . Essential hypertension 05/22/2009  . Coronary artery disease involving native coronary artery of native heart 05/22/2009    Conditions to be addressed/monitored:COPD and Falls  Care Plan : Fall Risk (Adult)  Updates made by Dannielle Karvonen, RN since 08/07/2020 12:00 AM  Problem: Fall Risk   Priority: High  Long-Range Goal: Absence of Fall and Fall-Related Injury   Start Date: 08/06/2020  Expected End Date: 10/30/2020  This Visit's Progress: On track  Priority: High  Current Barriers: Telephone call with patients daughter/ designated party release, [name].  Daughter states patient has history of bilateral osteoarthritis of her knees. She reports patient's leg will just "give out"  She states patient is not able to walk any distance without being short of breath and weak.  She states patient uses a walker and/ or wheelchair for ambulation.  Daughter reports patient has history of falls. She reports patient is unable to use her walker consistently in the home because it will not fit in her bedroom.  . Knowledge Deficits related to fall precautions in patient  . Unable to self administer medications  as prescribed . Unable to perform ADLs independently . Cognitive Deficits . No Advanced Directives in place Clinical Goal(s):  . patient will demonstrate improved adherence to prescribed treatment plan for decreasing falls as evidenced by patient reporting and review of EMR . patient Altamese Dilling will verbalize using fall risk reduction strategies discussed . patient will not experience additional falls . patient will attend  scheduled medical appointments . the patient will demonstrate ongoing self health care management ability as evidenced by adhering to fall precaution strategies and using ambulatory devices as recommended. . the patient/ caregiver will work with the care management team towards completion of advanced  directives Interventions:  . Collaboration with [name], NP regarding development and update of comprehensive plan of care as evidenced by provider attestation and co-signature . Inter-disciplinary care team collaboration (see longitudinal plan of care) . Provided written and verbal education re: Potential causes of falls and Fall prevention strategies . Reviewed medications and discussed potential side effects of medications such as dizziness and frequent urination . Assessed for s/s of orthostatic hypotension . Assessed for falls since last encounter. . Assessed patients caregivers knowledge of fall risk prevention. . Assessed working status of life alert bracelet and patient adherence: Daughter reports patient has a medical alert unit but does not wear it.  . Provided education to patient and/or caregiver about advanced directives . Reviewed scheduled/upcoming provider appointments including:  . Discussed plans with patient for ongoing care management follow up and provided patient with direct contact information for care management team Self-Care Deficits:  Unable to self administer medications as prescribed Unable to perform ADLs independently Patient Goals:  - Utilize walker and / or wheelchair appropriately with all ambulation - De-clutter walkways - Change positions slowly - Wear secure fitting shoes at all times with ambulation - Utilize home lighting for dim lit areas - Demonstrate self and pet awareness at all times - Keep items commonly used within reach  Follow Up Plan: The patient has been provided with contact information for the care management team and has been advised to call with any health related questions or concerns.  The care management team will reach out to the patient again over the next 45 days.      Care Plan : COPD (Adult)  Updates made by Dannielle Karvonen, RN since 08/07/2020 12:00 AM  Problem: Symptom Exacerbation (COPD)   Priority: High   Long-Range Goal: Symptom Exacerbation Prevented or Minimized   Start Date: 08/06/2020  Expected End Date: 10/30/2020  This Visit's Progress: On track  Priority: High  Current Barriers: Telephone call with patients daughter/ designated party release, [name]. Daughter states patient lives with her other daughter. She states patient had someone coming in to assist with her care 3 days a week but that has stopped. She report patients family now takes turns helping out during the day.   Daughter states patient was diagnosed with COPD in 2021. She states patient has been advised to wear oxygen at 2L continuously but will occasionally forget due to having memory issues.  . Knowledge deficits related to basic COPD self care/management . Cognitive Deficits . Unable to self administer medications as prescribed . Unable to perform ADLs independently . Unable to perform IADLs independently  . No Advance Directive in place Case Manager Clinical Goal(s):  Patient/ caregiver will verbalize understanding of COPD action plan and when to seek appropriate levels of medical care  Patient / caregiver will verbalize basic understanding of COPD disease process and  self care activities  Interventions:  . Collaboration with [name], NP regarding development and update of comprehensive plan of care as evidenced by provider attestation and co-signature . Inter-disciplinary care team collaboration (see longitudinal plan of care)  Provided patient/ caregiver with basic written and verbal COPD education on self care/management/and exacerbation prevention   Provided patient/caregiver with COPD action plan and reinforced importance of daily self assessment  Advised patient / caregiver to self assesses COPD action plan zone and make appointment with provider if in the yellow zone for 48 hours without improvement.  Provided education about and advised patient to utilize infection prevention strategies to  reduce risk of respiratory infection   Reviewed medications with caregiver  Mailed Advanced directive packet to patient/ caregiver Patient Goals: - Continue to use walker/ wheelchair for ambulation and safety - Continue to have in home care  - eliminate symptom triggers at home that will cause COPD to flare up - Call your doctor for new or ongoing mild to moderate symptoms. Call 911 for severe, life threatening symptoms - keep follow-up appointments with your providers - use an extra pillow to sleep if needed - listen for public air quality announcements every day Follow Up Plan: The patient has been provided with contact information for the care management team and has been advised to call with any health related questions or concerns.  The care management team will reach out to the patient again over the next 45 days.         Plan:The patient has been provided with contact information for the care management team and has been advised to call with any health related questions or concerns.  and The care management team will reach out to the patient again over the next 45 days. Quinn Plowman RN,BSN,CCM RN Case Manager [facility]  [phone]

## 2020-08-07 NOTE — Patient Instructions (Addendum)
Visit Information: Thank you for taking the time to speak with me today.   PATIENT GOALS:  Goals Addressed            This Visit's Progress   . Prevent Falls and Injury   On track    Timeframe:  Long-Range Goal Priority:  High Start Date:    08/06/2020                         Expected End Date:   10/30/2020                     Follow Up Date 09/09/2020   - Utilize walker and / or wheelchair appropriately with all ambulation - De-clutter walkways - Change positions slowly - Wear secure fitting shoes at all times with ambulation - Utilize home lighting for dim lit areas - Demonstrate self and pet awareness at all times - Keep items commonly used within reach    Why is this important?    Most falls happen when it is hard for you to walk safely. Your balance may be off because of an illness. You may have pain in your knees, hip or other joints.   You may be overly tired or taking medicines that make you sleepy. You may not be able to see or hear clearly.   Falls can lead to broken bones, bruises or other injuries.   There are things you can do to help prevent falling.         . Track and Manage My Symptoms-COPD   On track    Timeframe:  Long-Range Goal Priority:  High Start Date:  08/06/2020                           Expected End Date:   10/30/2020                    Follow Up Date 09/09/2020    - Continue to use walker/ wheelchair for ambulation and safety - Continue to have in home care  - eliminate symptom triggers at home that will cause COPD to flare up - Call your doctor for new or ongoing mild to moderate symptoms. Call 911 for severe, life threatening symptoms - keep follow-up appointments with your providers - use an extra pillow to sleep if needed - listen for public air quality announcements every day   Why is this important?    Tracking your symptoms and other information about your health helps your doctor plan your care.   Write down the symptoms, the time  of day, what you were doing and what medicine you are taking.   You will soon learn how to manage your symptoms.            Chronic Obstructive Pulmonary Disease  Chronic obstructive pulmonary disease (COPD) is a long-term (chronic) lung problem. When you have COPD, it is hard for air to get in and out of your lungs. Usually the condition gets worse over time, and your lungs will never return to normal. There are things you can do to keep yourself as healthy as possible. What are the causes?  Smoking. This is the most common cause.  Certain genes passed from parent to child (inherited). What increases the risk?  Being exposed to secondhand smoke from cigarettes, pipes, or cigars.  Being exposed to chemicals and other irritants, such as fumes and  dust in the work environment.  Having chronic lung conditions or infections. What are the signs or symptoms?  Shortness of breath, especially during physical activity.  A long-term cough with a large amount of thick mucus. Sometimes, the cough may not have any mucus (dry cough).  Wheezing.  Breathing quickly.  Skin that looks gray or blue, especially in the fingers, toes, or lips.  Feeling tired (fatigue).  Weight loss.  Chest tightness.  Having infections often.  Episodes when breathing symptoms become much worse (exacerbations). At the later stages of this disease, you may have swelling in the ankles, feet, or legs. How is this treated?  Taking medicines.  Quitting smoking, if you smoke.  Rehabilitation. This includes steps to make your body work better. It may involve a team of specialists.  Doing exercises.  Making changes to your diet.  Using oxygen.  Lung surgery.  Lung transplant.  Comfort measures (palliative care). Follow these instructions at home: Medicines  Take over-the-counter and prescription medicines only as told by your doctor.  Talk to your doctor before taking any cough or allergy  medicines. You may need to avoid medicines that cause your lungs to be dry. Lifestyle  If you smoke, stop smoking. Smoking makes the problem worse.  Do not smoke or use any products that contain nicotine or tobacco. If you need help quitting, ask your doctor.  Avoid being around things that make your breathing worse. This may include smoke, chemicals, and fumes.  Stay active, but remember to rest as well.  Learn and use tips on how to manage stress and control your breathing.  Make sure you get enough sleep. Most adults need at least 7 hours of sleep every night.  Eat healthy foods. Eat smaller meals more often. Rest before meals. Controlled breathing Learn and use tips on how to control your breathing as told by your doctor. Try:  Breathing in (inhaling) through your nose for 1 second. Then, pucker your lips and breath out (exhale) through your lips for 2 seconds.  Putting one hand on your belly (abdomen). Breathe in slowly through your nose for 1 second. Your hand on your belly should move out. Pucker your lips and breathe out slowly through your lips. Your hand on your belly should move in as you breathe out.   Controlled coughing Learn and use controlled coughing to clear mucus from your lungs. Follow these steps: 1. Lean your head a little forward. 2. Breathe in deeply. 3. Try to hold your breath for 3 seconds. 4. Keep your mouth slightly open while coughing 2 times. 5. Spit any mucus out into a tissue. 6. Rest and do the steps again 1 or 2 times as needed. General instructions  Make sure you get all the shots (vaccines) that your doctor recommends. Ask your doctor about a flu shot and a pneumonia shot.  Use oxygen therapy and pulmonary rehabilitation if told by your doctor. If you need home oxygen therapy, ask your doctor if you should buy a tool to measure your oxygen level (oximeter).  Make a COPD action plan with your doctor. This helps you to know what to do if you feel  worse than usual.  Manage any other conditions you have as told by your doctor.  Avoid going outside when it is very hot, cold, or humid.  Avoid people who have a sickness you can catch (contagious).  Keep all follow-up visits. Contact a doctor if:  You cough up more mucus than usual.  There is a change in the color or thickness of the mucus.  It is harder to breathe than usual.  Your breathing is faster than usual.  You have trouble sleeping.  You need to use your medicines more often than usual.  You have trouble doing your normal activities such as getting dressed or walking around the house. Get help right away if:  You have shortness of breath while resting.  You have shortness of breath that stops you from: ? Being able to talk. ? Doing normal activities.  Your chest hurts for longer than 5 minutes.  Your skin color is more blue than usual.  Your pulse oximeter shows that you have low oxygen for longer than 5 minutes.  You have a fever.  You feel too tired to breathe normally. These symptoms may represent a serious problem that is an emergency. Do not wait to see if the symptoms will go away. Get medical help right away. Call your local emergency services (911 in the [place] Do not drive yourself to the hospital. Summary  Chronic obstructive pulmonary disease (COPD) is a long-term lung problem.  The way your lungs work will never return to normal. Usually the condition gets worse over time. There are things you can do to keep yourself as healthy as possible.  Take over-the-counter and prescription medicines only as told by your doctor.  If you smoke, stop. Smoking makes the problem worse. This information is not intended to replace advice given to you by your health care provider. Make sure you discuss any questions you have with your health care provider. Document Revised: 12/26/2019 Document Reviewed: 12/26/2019 Elsevier Patient Education  2021 [place].  COPD Action Plan A COPD action plan is a description of what to do when you have a flare (exacerbation) of chronic obstructive pulmonary disease (COPD). Your action plan is a color-coded plan that lists the symptoms that indicate whether your condition is under control and what actions to take.  If you have symptoms in the Cora Brierley zone, it means you are doing well that day.  If you have symptoms in the yellow zone, it means you are having a bad day or an exacerbation.  If you have symptoms in the red zone, you need urgent medical care. Follow the plan that you and your health care provider developed. Review your plan with your health care provider at each visit. Red zone Symptoms in this zone mean that you should get medical help right away. They include:  Feeling very short of breath, even when you are resting.  Not being able to do any activities because of poor breathing.  Not being able to sleep because of poor breathing.  Fever or shaking chills.  Feeling confused or very sleepy.  Chest pain.  Coughing up blood. If you have any of these symptoms, call emergency services (911 in the [place] or go to the nearest emergency room.   Yellow zone Symptoms in this zone mean that your condition may be getting worse. They include:  Feeling more short of breath than usual.  Having less energy for daily activities than usual.  Phlegm or mucus that is thicker than usual.  Needing to use your rescue inhaler or nebulizer more often than usual.  More ankle swelling than usual.  Coughing more than usual.  Feeling like you have a chest cold.  Trouble sleeping due to COPD symptoms.  Decreased appetite.  COPD medicines not helping as much as usual. If you experience  any "yellow" symptoms:  Keep taking your daily medicines as directed.  Use your quick-relief inhaler as told by your health care provider.  If you were prescribed steroid medicine to take by mouth (oral  medicine), start taking it as told by your health care provider.  If you were prescribed an antibiotic medicine, start taking it as told by your health care provider. Do not stop taking the antibiotic even if you start to feel better.  Use oxygen as told by your health care provider.  Get more rest.  Do your pursed-lip breathing exercises.  Do not smoke. Avoid any irritants in the air. If your signs and symptoms do not improve after taking these steps, call your health care provider right away.   Derrall Hicks zone Symptoms in this zone mean that you are doing well. They include:  Being able to do your usual activities and exercise.  Having the usual amount of coughing, including the same amount of phlegm or mucus.  Being able to sleep well.  Having a good appetite.   Where to find more information: You can find more information about COPD from:  American Lung Association, My COPD Action Plan: www.lung.org  COPD Foundation: www.copdfoundation.[place]: https://wilson-eaton.com/ Follow these instructions at home:  Continue taking your daily medicines as told by your health care provider.  Make sure you receive all the immunizations that your health care provider recommends, especially the pneumococcal and influenza vaccines.  Wash your hands often with soap and water. Have family members wash their hands too. Regular hand washing can help prevent infections.  Follow your usual exercise and diet plan.  Avoid irritants in the air, such as smoke.  Do not use any products that contain nicotine or tobacco. These products include cigarettes, chewing tobacco, and vaping devices, such as e-cigarettes. If you need help quitting, ask your health care provider. Summary  A COPD action plan tells you what to do when you have a flare (exacerbation) of chronic obstructive pulmonary disease (COPD).  Follow each action plan for your symptoms. If you have any symptoms  in the red zone, call emergency services (911 in the [place] or go to the nearest emergency room. This information is not intended to replace advice given to you by your health care provider. Make sure you discuss any questions you have with your health care provider. Document Revised: 12/26/2019 Document Reviewed: 12/26/2019 Elsevier Patient Education  2021 [place] Prevention in the Home, Adult Falls can cause injuries and can happen to people of all ages. There are many things you can do to make your home safe and to help prevent falls. Ask for help when making these changes. What actions can I take to prevent falls? General Instructions  Use good lighting in all rooms. Replace any light bulbs that burn out.  Turn on the lights in dark areas. Use night-lights.  Keep items that you use often in easy-to-reach places. Lower the shelves around your home if needed.  Set up your furniture so you have a clear path. Avoid moving your furniture around.  Do not have throw rugs or other things on the floor that can make you trip.  Avoid walking on wet floors.  If any of your floors are uneven, fix them.  Add color or contrast paint or tape to clearly mark and help you see: ? Grab bars or handrails. ? First and last steps of staircases. ? Where the edge of  each step is.  If you use a stepladder: ? Make sure that it is fully opened. Do not climb a closed stepladder. ? Make sure the sides of the stepladder are locked in place. ? Ask someone to hold the stepladder while you use it.  Know where your pets are when moving through your home. What can I do in the bathroom?  Keep the floor dry. Clean up any water on the floor right away.  Remove soap buildup in the tub or shower.  Use nonskid mats or decals on the floor of the tub or shower.  Attach bath mats securely with double-sided, nonslip rug tape.  If you need to sit down in the shower, use a plastic, nonslip  stool.  Install grab bars by the toilet and in the tub and shower. Do not use towel bars as grab bars.      What can I do in the bedroom?  Make sure that you have a light by your bed that is easy to reach.  Do not use any sheets or blankets for your bed that hang to the floor.  Have a firm chair with side arms that you can use for support when you get dressed. What can I do in the kitchen?  Clean up any spills right away.  If you need to reach something above you, use a step stool with a grab bar.  Keep electrical cords out of the way.  Do not use floor polish or wax that makes floors slippery. What can I do with my stairs?  Do not leave any items on the stairs.  Make sure that you have a light switch at the top and the bottom of the stairs.  Make sure that there are handrails on both sides of the stairs. Fix handrails that are broken or loose.  Install nonslip stair treads on all your stairs.  Avoid having throw rugs at the top or bottom of the stairs.  Choose a carpet that does not hide the edge of the steps on the stairs.  Check carpeting to make sure that it is firmly attached to the stairs. Fix carpet that is loose or worn. What can I do on the outside of my home?  Use bright outdoor lighting.  Fix the edges of walkways and driveways and fix any cracks.  Remove anything that might make you trip as you walk through a door, such as a raised step or threshold.  Trim any bushes or trees on paths to your home.  Check to see if handrails are loose or broken and that both sides of all steps have handrails.  Install guardrails along the edges of any raised decks and porches.  Clear paths of anything that can make you trip, such as tools or rocks.  Have leaves, snow, or ice cleared regularly.  Use sand or salt on paths during winter.  Clean up any spills in your garage right away. This includes grease or oil spills. What other actions can I take?  Wear shoes  that: ? Have a low heel. Do not wear high heels. ? Have rubber bottoms. ? Feel good on your feet and fit well. ? Are closed at the toe. Do not wear open-toe sandals.  Use tools that help you move around if needed. These include: ? Canes. ? Walkers. ? Scooters. ? Crutches.  Review your medicines with your doctor. Some medicines can make you feel dizzy. This can increase your chance of falling. Ask your  doctor what else you can do to help prevent falls. Where to find more information  Centers for Disease Control and Prevention, [place]: http://www.wolf.info/  National Institute on Aging: http://kim-miller.com/ Contact a doctor if:  You are afraid of falling at home.  You feel weak, drowsy, or dizzy at home.  You fall at home. Summary  There are many simple things that you can do to make your home safe and to help prevent falls.  Ways to make your home safe include removing things that can make you trip and installing grab bars in the bathroom.  Ask for help when making these changes in your home. This information is not intended to replace advice given to you by your health care provider. Make sure you discuss any questions you have with your health care provider. Document Revised: 09/20/2019 Document Reviewed: 09/20/2019 Elsevier Patient Education  2021 [place].  Consent to CCM Services: Ms. Coggeshall was given information about Chronic Care Management services today including:  1. CCM service includes personalized support from designated clinical staff supervised by her physician, including individualized plan of care and coordination with other care providers 2. 24/7 contact phone numbers for assistance for urgent and routine care needs. 3. Service will only be billed when office clinical staff spend 20 minutes or more in a month to coordinate care. 4. Only one practitioner may furnish and bill the service in a calendar month. 5. The patient may stop CCM services at any time (effective at  the end of the month) by phone call to the office staff. 6. The patient will be responsible for cost sharing (co-pay) of up to 20% of the service fee (after annual deductible is met).  Patient agreed to services and verbal consent obtained.   Patient verbalizes understanding of instructions provided today and agrees to view in [place].   The patient has been provided with contact information for the care management team and has been advised to call with any health related questions or concerns.  The care management team will reach out to the patient again over the next 45 days.   Quinn Plowman RN,BSN,CCM RN Case Manager [facility]  [phone]    CLINICAL CARE PLAN: Patient Care Plan: Fall Risk (Adult)  Problem Identified: Fall Risk   Priority: High  Long-Range Goal: Absence of Fall and Fall-Related Injury   Start Date: 08/06/2020  Expected End Date: 10/30/2020  This Visit's Progress: On track  Priority: High  Current Barriers: Telephone call with patients daughter/ designated party release, [name].  Daughter states patient has history of bilateral osteoarthritis of her knees. She reports patient's leg will just "give out"  She states patient is not able to walk any distance without being short of breath and weak.  She states patient uses a walker and/ or wheelchair for ambulation.  Daughter reports patient has history of falls. She reports patient is unable to use her walker consistently in the home because it will not fit in her bedroom.  . Knowledge Deficits related to fall precautions in patient  . Unable to self administer medications as prescribed . Unable to perform ADLs independently . Cognitive Deficits . No Advanced Directives in place Clinical Goal(s):  . patient will demonstrate improved adherence to prescribed treatment plan for decreasing falls as evidenced by patient reporting and review of EMR . patient Altamese Dilling will verbalize using fall risk reduction  strategies discussed . patient will not experience additional falls . patient will attend  scheduled medical appointments .  the patient will demonstrate ongoing self health care management ability as evidenced by adhering to fall precaution strategies and using ambulatory devices as recommended. . the patient/ caregiver will work with the care management team towards completion of advanced directives Interventions:  . Collaboration with [name], NP regarding development and update of comprehensive plan of care as evidenced by provider attestation and co-signature . Inter-disciplinary care team collaboration (see longitudinal plan of care) . Provided written and verbal education re: Potential causes of falls and Fall prevention strategies . Reviewed medications and discussed potential side effects of medications such as dizziness and frequent urination . Assessed for s/s of orthostatic hypotension . Assessed for falls since last encounter. . Assessed patients caregivers knowledge of fall risk prevention. . Assessed working status of life alert bracelet and patient adherence: Daughter reports patient has a medical alert unit but does not wear it.  . Provided education to patient and/or caregiver about advanced directives . Reviewed scheduled/upcoming provider appointments including:  . Discussed plans with patient for ongoing care management follow up and provided patient with direct contact information for care management team Self-Care Deficits:  Unable to self administer medications as prescribed Unable to perform ADLs independently Patient Goals:  - Utilize walker and / or wheelchair appropriately with all ambulation - De-clutter walkways - Change positions slowly - Wear secure fitting shoes at all times with ambulation - Utilize home lighting for dim lit areas - Demonstrate self and pet awareness at all times - Keep items commonly used within reach  Follow Up Plan: The  patient has been provided with contact information for the care management team and has been advised to call with any health related questions or concerns.  The care management team will reach out to the patient again over the next 45 days.      Patient Care Plan: COPD (Adult)  Problem Identified: Symptom Exacerbation (COPD)   Priority: High  Long-Range Goal: Symptom Exacerbation Prevented or Minimized   Start Date: 08/06/2020  Expected End Date: 10/30/2020  This Visit's Progress: On track  Priority: High  Current Barriers: Telephone call with patients daughter/ designated party release, [name]. Daughter states patient lives with her other daughter. She states patient had someone coming in to assist with her care 3 days a week but that has stopped. She report patients family now takes turns helping out during the day.   Daughter states patient was diagnosed with COPD in 2021. She states patient has been advised to wear oxygen at 2L continuously but will occasionally forget due to having memory issues.  . Knowledge deficits related to basic COPD self care/management . Cognitive Deficits . Unable to self administer medications as prescribed . Unable to perform ADLs independently . Unable to perform IADLs independently  . No Advance Directive in place Case Manager Clinical Goal(s):  Patient/ caregiver will verbalize understanding of COPD action plan and when to seek appropriate levels of medical care  Patient / caregiver will verbalize basic understanding of COPD disease process and self care activities  Interventions:  . Collaboration with [name], NP regarding development and update of comprehensive plan of care as evidenced by provider attestation and co-signature . Inter-disciplinary care team collaboration (see longitudinal plan of care)  Provided patient/ caregiver with basic written and verbal COPD education on self care/management/and exacerbation prevention   Provided  patient/caregiver with COPD action plan and reinforced importance of daily self assessment  Advised patient / caregiver to self assesses COPD action  plan zone and make appointment with provider if in the yellow zone for 48 hours without improvement.  Provided education about and advised patient to utilize infection prevention strategies to reduce risk of respiratory infection   Reviewed medications with caregiver  Mailed Advanced directive packet to patient/ caregiver Patient Goals: - Continue to use walker/ wheelchair for ambulation and safety - Continue to have in home care  - eliminate symptom triggers at home that will cause COPD to flare up - Call your doctor for new or ongoing mild to moderate symptoms. Call 911 for severe, life threatening symptoms - keep follow-up appointments with your providers - use an extra pillow to sleep if needed - listen for public air quality announcements every day Follow Up Plan: The patient has been provided with contact information for the care management team and has been advised to call with any health related questions or concerns.  The care management team will reach out to the patient again over the next 45 days.

## 2020-08-29 ENCOUNTER — Other Ambulatory Visit: Payer: Self-pay | Admitting: Primary Care

## 2020-08-29 DIAGNOSIS — E785 Hyperlipidemia, unspecified: Secondary | ICD-10-CM

## 2020-09-04 ENCOUNTER — Other Ambulatory Visit: Payer: Self-pay | Admitting: Primary Care

## 2020-09-04 ENCOUNTER — Other Ambulatory Visit: Payer: Self-pay | Admitting: Cardiovascular Disease

## 2020-09-04 DIAGNOSIS — F411 Generalized anxiety disorder: Secondary | ICD-10-CM

## 2020-09-09 ENCOUNTER — Ambulatory Visit (INDEPENDENT_AMBULATORY_CARE_PROVIDER_SITE_OTHER): Payer: Medicare Other

## 2020-09-09 DIAGNOSIS — J449 Chronic obstructive pulmonary disease, unspecified: Secondary | ICD-10-CM | POA: Diagnosis not present

## 2020-09-09 DIAGNOSIS — R296 Repeated falls: Secondary | ICD-10-CM

## 2020-09-09 NOTE — Chronic Care Management (AMB) (Signed)
Chronic Care Management   CCM RN Visit Note  09/09/2020 Name: Cindy Brandt MRN: [id number] DOB: May 28, 1931  Subjective: Cindy Brandt is a 87 y.o. year old female who is a primary care patient of [name], NP. The care management team was consulted for assistance with disease management and care coordination needs.    Engaged with patient by telephone for follow up visit in response to provider referral for case management and/or care coordination services.   Consent to Services:  The patient was given information about Chronic Care Management services, agreed to services, and gave verbal consent prior to initiation of services.  Please see initial visit note for detailed documentation.   Patient agreed to services and verbal consent obtained.   Assessment: Review of patient past medical history, allergies, medications, health status, including review of consultants reports, laboratory and other test data, was performed as part of comprehensive evaluation and provision of chronic care management services.   SDOH (Social Determinants of Health) assessments and interventions performed:    CCM Care Plan  Allergies  Allergen Reactions   Ranexa [Ranolazine] Other (See Comments)    Does NOT "agree" with the patient- does not feel like herself    Outpatient Encounter Medications as of 09/09/2020  Medication Sig Note   albuterol (PROVENTIL) (2.5 MG/3ML) 0.083% nebulizer solution Take 3 mLs (2.5 mg total) by nebulization every 6 (six) hours as needed for wheezing or shortness of breath.    amLODipine (NORVASC) 2.5 MG tablet TAKE 1 TABLET BY MOUTH EVERY DAY    aspirin EC 81 MG tablet Take 81 mg by mouth every morning.    atenolol (TENORMIN) 25 MG tablet TAKE 1 TABLET BY MOUTH EVERY DAY    atorvastatin (LIPITOR) 80 MG tablet TAKE 1 TABLET BY MOUTH EVERY DAY IN THE EVENING FOR CHOLESTEROL    busPIRone (BUSPAR) 7.5 MG tablet NEW PRESCRIPTION REQUEST: TAKE ONE TABLET BY MOUTH TWICE DAILY     calcium carbonate (OS-CAL) 600 MG TABS tablet Take 1,200 mg by mouth daily.    cetirizine (ZYRTEC) 10 MG tablet Take 10 mg by mouth daily.    clopidogrel (PLAVIX) 75 MG tablet NEW PRESCRIPTION REQUEST: TAKE ONE TABLET BY MOUTH DAILY    famotidine (PEPCID) 20 MG tablet Take 20 mg by mouth daily.     fluticasone (FLONASE) 50 MCG/ACT nasal spray PLACE 1 SPRAY INTO BOTH NOSTRILS 2 (TWO) TIMES DAILY    Fluticasone-Umeclidin-Vilant (TRELEGY ELLIPTA) 100-62.5-25 MCG/INH AEPB Inhale 1 puff into the lungs daily.    gabapentin (NEURONTIN) 100 MG capsule NEW PRESCRIPTION REQUEST: TAKE ONE CAPSULE BY MOUTH IN THE MORNING AND TAKE TWO CAPSULES BY MOUHT IN THE EVENING    isosorbide mononitrate (IMDUR) 120 MG 24 hr tablet Take 60 mg by mouth in the morning. 12/23/2019: 0.5 tablet = 60 mg   Melatonin 5 MG TABS Take 5 mg by mouth at bedtime as needed (sleep).     nitroGLYCERIN (NITROSTAT) 0.4 MG SL tablet PLACE 1 TABLET UNDER THE TONGUE EVERY 5 MIN AS NEEDED FOR CHEST PAIN    pantoprazole (PROTONIX) 40 MG tablet TAKE 1 TABLET BY MOUTH ONCE DAILY FOR HEARTBURN.    PROAIR HFA 108 (90 Base) MCG/ACT inhaler NEW PRESCRIPTION REQUEST: INHALE ONE PUFF BY MOUTH EVERY FOUR HOURS AS NEEDED    sertraline (ZOLOFT) 100 MG tablet TAKE 1 TABLET BY MOUTH ONCE DAILY FOR DEPRESSION/ANXIETY.    solifenacin (VESICARE) 5 MG tablet TAKE 1 TABLET (5 MG TOTAL) BY MOUTH DAILY. FOR OVERACTIVE  BLADDER.    spironolactone (ALDACTONE) 25 MG tablet TAKE 1/2 TABLET BY MOUTH EVERY DAY    No facility-administered encounter medications on file as of 09/09/2020.    Patient Active Problem List   Diagnosis Date Noted   Memory changes 05/10/2020   Chronic obstructive pulmonary disease ([place]) 02/05/2020   Sensation of fullness in left ear 02/05/2020   Pain in both feet 01/04/2020   Acute respiratory failure with hypoxia ([place]) 12/23/2019   Pneumonia of both lungs due to infectious organism 12/23/2019   Chronic diastolic CHF (congestive heart  failure) ([place]) 12/23/2019   Sepsis ([place]) 12/23/2019   Left bundle branch block 12/23/2019   Cough 10/27/2019   Pain of right lower extremity 06/26/2019   Dizziness 06/26/2019   Herpes zoster without complication 90/24/0973   Angular cheilitis 05/04/2018   Hemorrhoids 04/11/2018   Lower abdominal pain 04/11/2018   Osteoporosis 11/15/2017   Prediabetes 11/15/2017   Presbycusis of both ears 11/16/2016   Anemia 06/20/2016   Dyspnea    NSTEMI (non-ST elevated myocardial infarction) ([place]) 06/14/2016   Unstable angina ([place]) 06/14/2016   Hx of adenomatous colonic polyps 04/06/2016   GERD without esophagitis 10/29/2015   IBS (irritable bowel syndrome) 10/29/2015   Urinary incontinence 10/29/2015   Osteoarthritis 53/29/9242   Diastolic dysfunction 68/34/1962   Chest pain 11/23/2011   Chronic pain of left knee 01/08/2011   HYPOKALEMIA 07/31/2009   Mixed hyperlipidemia 05/22/2009   Anxiety and depression 05/22/2009   Essential hypertension 05/22/2009   Coronary artery disease involving native coronary artery of native heart 05/22/2009    Conditions to be addressed/monitored:COPD and falls  Care Plan : Fall Risk (Adult)  Updates made by Dannielle Karvonen, RN since 09/09/2020 12:00 AM   Problem: Fall Risk   Priority: High   Long-Range Goal: Absence of Fall and Fall-Related Injury   Start Date: 08/06/2020  Expected End Date: 11/29/2020  This Visit's Progress: On track  Recent Progress: On track  Priority: High  Current Barriers: Telephone call with patients daughter/ designated party release, [name].  Daughter states she is not available this morning. Request RNCM call patient's daughter, Narda Bonds who patient is with today.  Call to Ms. Ronnald Ramp. Spoke with patient. HIPAA verified.  Patient gave verbal permission to speak with Narda Bonds regarding her health information. Both Ms. Ronnald Ramp and patient was on the call.  Patient denies any falls since last outreach with RNCM.  Patient  states she experienced dizziness approximately 1 week ago. She states her daughter called the doctor to notify.  Patient denies taking any new medication for the dizziness. She states the dizziness has passed.   Patient continues to have around the clock care from family.  Knowledge Deficits related to fall precautions in patient  Unable to self administer medications as prescribed:  Ms. Ronnald Ramp and patient states she continues to take her medications as prescribed.   Unable to perform ADLs independently Cognitive Deficits No Advanced Directives in place: Advanced directive packet mailed to patient at last outreach with [facility].  Clinical Goal(s):  patient will demonstrate improved adherence to prescribed treatment plan for decreasing falls as evidenced by patient reporting and review of EMR patient /caregiver will verbalize using fall risk reduction strategies discussed patient will not experience additional falls patient will attend  scheduled medical appointments the patient will demonstrate ongoing self health care management ability as evidenced by adhering to fall precaution strategies and using ambulatory devices as recommended. the patient/ caregiver will work with the  care management team towards completion of advanced directives Interventions:  Collaboration with [name], NP regarding development and update of comprehensive plan of care as evidenced by provider attestation and co-signature Inter-disciplinary care team collaboration (see longitudinal plan of care) Provided  verbal education re: Potential causes of falls and Fall prevention strategies Reviewed and discussed medications Assessed for s/s of orthostatic hypotension:  Ms. Ronnald Ramp states patient's blood pressure was not taken today.  Assessed for falls since last encounter.  Assessed patients caregivers knowledge of fall risk prevention. Assessed working status of life alert bracelet and patient adherence Reviewed  scheduled/upcoming provider appointments including.  Patient denies any upcoming appointment with providers. She states her daughter [name] manages her appointments.  Discussed plans with patient for ongoing care management follow up and provided patient with direct contact information for care management team Self-Care Deficits:  Unable to self administer medications as prescribed Unable to perform ADLs independently Patient Goals:  - Continue to utilize walker and / or wheelchair appropriately with all ambulation - Keep walkways de-cluttered.  - Change positions slowly (from laying to sitting, from sitting to standing to help with decreasing dizziness).  Monitor blood pressure at least 3 times per week.  -  Continue to wear secure fitting shoes at all times with ambulation - Utilize home lighting for dim lit areas - Keep items commonly used within reach  Follow Up Plan: The patient has been provided with contact information for the care management team and has been advised to call with any health related questions or concerns.  The care management team will reach out to the patient again over the next 45 days.       Care Plan : COPD (Adult)  Updates made by Dannielle Karvonen, RN since 09/09/2020 12:00 AM   Problem: Symptom Exacerbation (COPD)   Priority: High   Long-Range Goal: Symptom Exacerbation Prevented or Minimized   Start Date: 08/06/2020  Expected End Date: 11/29/2020  This Visit's Progress: On track  Recent Progress: On track  Priority: High  Current Barriers: Telephone call with patients daughter/ designated party release, [name].  Daughter states she is not available this morning. Request RNCM call patient's daughter, Narda Bonds who patient is with today.  Call to Ms. Ronnald Ramp. Spoke with patient. HIPAA verified.  Patient gave verbal permission to speak with Narda Bonds regarding her health information. Both Ms. Ronnald Ramp and patient was on the call.  Patient denies any  increased shortness of breath or cough.  Patient states she continues to wear hear oxygen at 2 L.   Patient continues to have around the clock care from her family.  Patient states she is taking her medications as prescribed. Patient reports experiencing dizziness approximately one week ago. She reports her daughter notified the doctor.  Medication treatment was suggested but patient and Ms. Ronnald Ramp states patient is not taking any new medications. Patient states the dizziness has stopped for now. .  Knowledge deficits related to basic COPD self care/management Cognitive Deficits Unable to self administer medications as prescribed Unable to perform ADLs independently Unable to perform IADLs independently  Case Manager Clinical Goal(s): Patient/ caregiver will verbalize understanding of COPD action plan and when to seek appropriate levels of medical care Patient / caregiver will verbalize basic understanding of COPD disease process and self care activities  Interventions:  Collaboration with [name], NP regarding development and update of comprehensive plan of care as evidenced by provider attestation and co-signature Inter-disciplinary care team  collaboration (see longitudinal plan of care) Provided patient/ caregiver with verbal COPD education on self care/management/and exacerbation prevention  Provided patient/caregiver with COPD action plan and reinforced importance of daily self assessment:  Discussed with patient/ Ms. Ronnald Ramp ongoing monitoring of COPD symptoms. Advised to call provider for any mild to moderate changes and call 911 for severe symptoms.  Patient/ Ms. Ronnald Ramp verbalized understanding.  Advised patient / caregiver to self assesses COPD action plan zone and make appointment with provider if in the yellow zone for 48 hours without improvement. Provided education about and advised patient to utilize infection prevention strategies to reduce risk of respiratory infection   Reviewed medications with caregiver Mailed Advance directive packet to patient/ caregiver:  RNCM will follow up with patient's daughter, [name] regarding receiving Advance Directive packet.  Patient Goals: - Continue to use walker/ wheelchair for ambulation and safety - eliminate symptom triggers at home that will cause COPD to flare up - Call your doctor for new or ongoing mild to moderate symptoms. Call 911 for severe, life threatening symptoms - keep follow-up appointments with your providers - use an extra pillow to sleep if needed - listen for public air quality announcements every day Follow Up Plan: The patient has been provided with contact information for the care management team and has been advised to call with any health related questions or concerns.  The care management team will reach out to the patient again over the next 45 days.        Plan:The patient has been provided with contact information for the care management team and has been advised to call with any health related questions or concerns.  and The care management team will reach out to the patient again over the next 45 days. Quinn Plowman RN,BSN,CCM RN Case Manager [place]  [phone]

## 2020-09-09 NOTE — Patient Instructions (Addendum)
   Visit Information:  Thank you for taking the time to speak with me today.   PATIENT GOALS:  Goals Addressed             This Visit's Progress    Prevent Falls and Injury   On track    Timeframe:  Long-Range Goal Priority:  High Start Date:    08/06/2020                         Expected End Date:  11/29/2020                   Follow Up Date 10/21/2020   - Continue to utilize walker and / or wheelchair appropriately with all ambulation - Keep walkways de-cluttered.  - Change positions slowly (from laying to sitting, from sitting to standing to help with decreasing dizziness).  Monitor blood pressure at least 3 times per week.  -  Continue to wear secure fitting shoes at all times with ambulation - Utilize home lighting for dim lit areas - Keep items commonly used within reach    Why is this important?   Most falls happen when it is hard for you to walk safely. Your balance may be off because of an illness. You may have pain in your knees, hip or other joints.  You may be overly tired or taking medicines that make you sleepy. You may not be able to see or hear clearly.  Falls can lead to broken bones, bruises or other injuries.  There are things you can do to help prevent falling.           Track and Manage My Symptoms-COPD   On track    Timeframe:  Long-Range Goal Priority:  High Start Date:  08/06/2020                           Expected End Date:   11/29/2020                   Follow Up Date 10/21/2020   - Continue to use walker/ wheelchair for ambulation and safety - Continue to have in home care  - eliminate symptom triggers at home that will cause COPD to flare up - Call your doctor for new or ongoing mild to moderate symptoms. Call 911 for severe, life threatening symptoms - keep follow-up appointments with your providers - use an extra pillow to sleep if needed - listen for public air quality announcements every day   Why is this important?   Tracking your symptoms  and other information about your health helps your doctor plan your care.  Write down the symptoms, the time of day, what you were doing and what medicine you are taking.  You will soon learn how to manage your symptoms.              Patient verbalizes understanding of instructions provided today and agrees to view in [place].   The patient has been provided with contact information for the care management team and has been advised to call with any health related questions or concerns.  The care management team will reach out to the patient again over the next 45 days.   Quinn Plowman RN,BSN,CCM RN Case Manager [place]  [phone]

## 2020-09-20 ENCOUNTER — Other Ambulatory Visit: Payer: Self-pay | Admitting: Primary Care

## 2020-09-20 DIAGNOSIS — M159 Polyosteoarthritis, unspecified: Secondary | ICD-10-CM

## 2020-09-20 DIAGNOSIS — M79672 Pain in left foot: Secondary | ICD-10-CM

## 2020-09-20 DIAGNOSIS — M79671 Pain in right foot: Secondary | ICD-10-CM

## 2020-10-03 ENCOUNTER — Other Ambulatory Visit: Payer: Self-pay | Admitting: Cardiovascular Disease

## 2020-10-06 DIAGNOSIS — R413 Other amnesia: Secondary | ICD-10-CM

## 2020-10-06 DIAGNOSIS — J449 Chronic obstructive pulmonary disease, unspecified: Secondary | ICD-10-CM

## 2020-10-06 DIAGNOSIS — I5032 Chronic diastolic (congestive) heart failure: Secondary | ICD-10-CM

## 2020-10-06 DIAGNOSIS — R296 Repeated falls: Secondary | ICD-10-CM

## 2020-10-07 NOTE — Telephone Encounter (Signed)
Cindy Brandt, FYI. Can you watch this for me while I'm gone? Just need to make sure they get connected with someone who can help. I placed a referral to the Care Coordination nurse, I think.Marland KitchenMarland Kitchen

## 2020-10-07 NOTE — Telephone Encounter (Signed)
I am not aware of any most need to be a month or more are you aware of anything? Might be able to get a private sitter but that is all I am aware of.

## 2020-10-14 ENCOUNTER — Telehealth: Payer: Self-pay | Admitting: *Deleted

## 2020-10-14 NOTE — Chronic Care Management (AMB) (Signed)
  Chronic Care Management   Note  10/14/2020 Name: [name] MRN: [id number] DOB: February 16, 1932  [name] is a 87 y.o. year old female who is a primary care patient of [name], NP. HASNA TAFF is currently enrolled in care management services. An additional referral for Licensed Clinical SW was placed.   Follow up plan: Telephone appointment with care management team member scheduled for: 10/18/2020  Julian Hy, [place] Management  Direct Dial: [phone]

## 2020-10-15 ENCOUNTER — Telehealth: Payer: Self-pay | Admitting: *Deleted

## 2020-10-15 NOTE — Chronic Care Management (AMB) (Signed)
  Care Management   Note  10/15/2020 Name: [name] MRN: [id number] DOB: 1931/06/19  Cindy Brandt is a 87 y.o. year old female who is a primary care patient of [name], NP and is actively engaged with the care management team. I reached out to Cindy Brandt by phone today to assist with re-scheduling a follow up visit with the RN Case Manager  Follow up plan: Unsuccessful telephone outreach attempt made. A HIPAA compliant phone message was left for the patient providing contact information and requesting a return call.   Julian Hy, [place] Management  Direct Dial: [phone]

## 2020-10-16 NOTE — Chronic Care Management (AMB) (Signed)
  Care Management   Note  10/16/2020 Name: [name] MRN: [id number] DOB: 21-May-1931  Cindy Brandt is a 87 y.o. year old female who is a primary care patient of [name], NP and is actively engaged with the care management team. I reached out to Cindy Brandt by phone today to assist with re-scheduling a follow up visit with the RN Case Manager  Follow up plan: Telephone appointment with care management team member scheduled for: 10/22/2020  Julian Hy, [place], [place] Management  Direct Dial: [phone]

## 2020-10-17 NOTE — Telephone Encounter (Signed)
Please call to triage also ask if she has been contacted for the assistance in other message.

## 2020-10-18 ENCOUNTER — Emergency Department (HOSPITAL_COMMUNITY)
Admission: EM | Admit: 2020-10-18 | Discharge: 2020-10-18 | Disposition: A | Discharge status: Home / Self Care | Payer: Medicare Other | Attending: Emergency Medicine | Admitting: Emergency Medicine

## 2020-10-18 ENCOUNTER — Ambulatory Visit: Payer: Medicare Other | Admitting: *Deleted

## 2020-10-18 ENCOUNTER — Emergency Department (HOSPITAL_COMMUNITY): Payer: Medicare Other

## 2020-10-18 DIAGNOSIS — I2510 Atherosclerotic heart disease of native coronary artery without angina pectoris: Secondary | ICD-10-CM | POA: Diagnosis not present

## 2020-10-18 DIAGNOSIS — S0990XA Unspecified injury of head, initial encounter: Secondary | ICD-10-CM

## 2020-10-18 DIAGNOSIS — W109XXA Fall (on) (from) unspecified stairs and steps, initial encounter: Secondary | ICD-10-CM | POA: Insufficient documentation

## 2020-10-18 DIAGNOSIS — Z87891 Personal history of nicotine dependence: Secondary | ICD-10-CM | POA: Insufficient documentation

## 2020-10-18 DIAGNOSIS — Z7982 Long term (current) use of aspirin: Secondary | ICD-10-CM | POA: Insufficient documentation

## 2020-10-18 DIAGNOSIS — S0003XA Contusion of scalp, initial encounter: Secondary | ICD-10-CM | POA: Diagnosis not present

## 2020-10-18 DIAGNOSIS — I517 Cardiomegaly: Secondary | ICD-10-CM | POA: Diagnosis not present

## 2020-10-18 DIAGNOSIS — Z7952 Long term (current) use of systemic steroids: Secondary | ICD-10-CM | POA: Diagnosis not present

## 2020-10-18 DIAGNOSIS — W19XXXA Unspecified fall, initial encounter: Secondary | ICD-10-CM

## 2020-10-18 DIAGNOSIS — Z79899 Other long term (current) drug therapy: Secondary | ICD-10-CM | POA: Insufficient documentation

## 2020-10-18 DIAGNOSIS — I10 Essential (primary) hypertension: Secondary | ICD-10-CM | POA: Diagnosis not present

## 2020-10-18 DIAGNOSIS — J449 Chronic obstructive pulmonary disease, unspecified: Secondary | ICD-10-CM | POA: Insufficient documentation

## 2020-10-18 DIAGNOSIS — S199XXA Unspecified injury of neck, initial encounter: Secondary | ICD-10-CM | POA: Diagnosis not present

## 2020-10-18 DIAGNOSIS — I5032 Chronic diastolic (congestive) heart failure: Secondary | ICD-10-CM | POA: Diagnosis not present

## 2020-10-18 DIAGNOSIS — I110 Hypertensive heart disease with heart failure: Secondary | ICD-10-CM | POA: Diagnosis not present

## 2020-10-18 DIAGNOSIS — M25552 Pain in left hip: Secondary | ICD-10-CM | POA: Diagnosis not present

## 2020-10-18 DIAGNOSIS — Z7902 Long term (current) use of antithrombotics/antiplatelets: Secondary | ICD-10-CM | POA: Diagnosis not present

## 2020-10-18 DIAGNOSIS — J9811 Atelectasis: Secondary | ICD-10-CM | POA: Diagnosis not present

## 2020-10-18 LAB — COMPREHENSIVE METABOLIC PANEL
ALT: 12 U/L (ref 0–44)
AST: 16 U/L (ref 15–41)
Albumin: 3.5 g/dL (ref 3.5–5.0)
Alkaline Phosphatase: 42 U/L (ref 38–126)
Anion gap: 7 (ref 5–15)
BUN: 14 mg/dL (ref 8–23)
CO2: 29 mmol/L (ref 22–32)
Calcium: 9 mg/dL (ref 8.9–10.3)
Chloride: 101 mmol/L (ref 98–111)
Creatinine, Ser: 0.84 mg/dL (ref 0.44–1.00)
GFR, Estimated: >60 mL/min (ref >60)
Glucose, Bld: 205 mg/dL — ABNORMAL HIGH (ref 70–99)
Potassium: 4 mmol/L (ref 3.5–5.1)
Sodium: 137 mmol/L (ref 135–145)
Total Bilirubin: 0.8 mg/dL (ref 0.3–1.2)
Total Protein: 6.5 g/dL (ref 6.5–8.1)

## 2020-10-18 LAB — CBC WITH DIFFERENTIAL/PLATELET
Abs Immature Granulocytes: 0.01 10*3/uL (ref 0.00–0.07)
Basophils Absolute: 0 10*3/uL (ref 0.0–0.1)
Basophils Relative: 0 %
Eosinophils Absolute: 0.1 10*3/uL (ref 0.0–0.5)
Eosinophils Relative: 3 %
HCT: 34.1 % — ABNORMAL LOW (ref 36.0–46.0)
Hemoglobin: 11.2 g/dL — ABNORMAL LOW (ref 12.0–15.0)
Immature Granulocytes: 0 %
Lymphocytes Relative: 24 %
Lymphs Abs: 1 10*3/uL (ref 0.7–4.0)
MCH: 34.9 pg — ABNORMAL HIGH (ref 26.0–34.0)
MCHC: 32.8 g/dL (ref 30.0–36.0)
MCV: 106.2 fL — ABNORMAL HIGH (ref 80.0–100.0)
Monocytes Absolute: 0.3 10*3/uL (ref 0.1–1.0)
Monocytes Relative: 8 %
Neutro Abs: 2.6 10*3/uL (ref 1.7–7.7)
Neutrophils Relative %: 65 %
Platelets: 208 10*3/uL (ref 150–400)
RBC: 3.21 MIL/uL — ABNORMAL LOW (ref 3.87–5.11)
RDW: 11.6 % (ref 11.5–15.5)
WBC: 4 10*3/uL (ref 4.0–10.5)
nRBC: 0 % (ref 0.0–0.2)

## 2020-10-18 LAB — PROTIME-INR
INR: 1 (ref 0.8–1.2)
Prothrombin Time: 13.5 seconds (ref 11.4–15.2)

## 2020-10-18 LAB — TROPONIN I (HIGH SENSITIVITY): Troponin I (High Sensitivity): 6 ng/L (ref <18)

## 2020-10-18 NOTE — ED Notes (Signed)
Pt is here for multiple falls x 2 weeks. Approximately 3-4 falls x 2 weeks. Pt reports weakness to foot at times. Alert and oriented x 3. Reports generalized pain. Able to take a few steps from wheelchair to stretcher with 2 person assist. Uses a walker at home per daughter.

## 2020-10-18 NOTE — ED Provider Notes (Signed)
Emergency Medicine Provider Triage Evaluation Note  Cindy Brandt , a 87 y.o. female  was evaluated in triage.  Pt complains of fall two days ago. Pt is notably a poor historian. When asked why she is in the ER today cannot seem to come up with a reason.  Contacted patient's daughter who was on the way to the ER.  Patient has apparently had a fall 5 days ago that was relatively low impact.  Then had a fall 4 days ago where she fell backwards 4 stairs.  She has been at her baseline ability to walk which is poor and requires a walker and assistance sometimes uses a wheelchair  Patient denies any pain currently while sitting in the chair.  She states that when she moves around she has some pain in her ribs on both sides.  She has no other significant complaints although she does point to a lump on her head and states that her head did hurt and that.  She denies any loss of consciousness.  Has been mentating at her baseline.  Patient is with dementia, pleasant.  Review of Systems  Positive: Rib pain, head injury Negative: Abdominal pain, back pain  Physical Exam  BP (!) 144/71 (BP Location: Left Arm)   Pulse 67   Temp 98 F (36.7 C) (Oral)   Resp 16   SpO2 100%  Gen:   Awake, no distress   Resp:  Normal effort  MSK:   Moves extremities without difficulty  Other:  No tenderness palpation of the C, T, L-spine.  Moving all 4 extremities.  Grip symmetrically strong.  Small lump to the left occiput of her head.  Some mild left tenderness palpation of the hip.  Medical Decision Making  Medically screening exam initiated at 12:14 PM.  Appropriate orders placed.  Cindy Brandt was informed that the remainder of the evaluation will be completed by another provider, this initial triage assessment does not replace that evaluation, and the importance of remaining in the ED until their evaluation is complete.  Patient with relatively poor ability to tell history presented today with fall that occurred 4 days  ago she also had a small fall 5 days ago.  Seems to be weak and prevent falls at baseline but there was concern that she may be falling for cardiac reasons per daughter.  Patient is not complaining any shortness of breath lightheadedness or dizziness.  She has no midline spinal tenderness.  Low suspicion for fracture.  Will obtain CT head, C-spine, plain films of chest and hips  Basic labs and troponin.  They understand the necessity of waiting in the ER not leaving prior to assessment of the back.    Tedd Sias, [place] 10/18/20 1226    Luna Fuse, MD 10/18/20 1425

## 2020-10-18 NOTE — Telephone Encounter (Signed)
Noted.. pt to go to ER for eval given fall down stairs.

## 2020-10-18 NOTE — ED Triage Notes (Signed)
Patient here for evaluation of pain in multiple locations after falling two days ago while walking up steps. Patient denies LOC. Wears 2L [place] as baseline.

## 2020-10-18 NOTE — Discharge Instructions (Addendum)
Call your primary care doctor or specialist as discussed in the next 2-3 days.   Return immediately back to the ER if:  Your symptoms worsen within the next 12-24 hours. You develop new symptoms such as new fevers, persistent vomiting, new pain, shortness of breath, or new weakness or numbness, or if you have any other concerns.  

## 2020-10-18 NOTE — Telephone Encounter (Signed)
I spoke with Cindy Brandt;(DPR signed) pt fell on 10/15/20 and 10/16/20 and pt hit her head with knot and bruising on 10/16/20 after falling down 5 stairs. Cindy Brandt said pt did not lose consciousness but pt is sore on both sides and ribs hurt. Pt is having difficulty in walking and advised pt needs to be eval and possible testing at ED. [name] said she is in [place] and pt is in [place] but [name] will get pt to [facility] ED later today. [name] said while on phone with me Marcie Bal the social worker with [facility] called her but [name] missed call and wants to know if I can ask social worker to call Cindy Brandt back. I advised not sure who Marcie Bal a Education officer, museum with [employer] is and [name] will ck to see if Marcie Bal left v/m or contact #. [name] said she is not aware of another message so Joellen if need to will need to contact [place]. Sending note to Dr Nelson Chimes NP as PCP and out of office and [facility] CMA.

## 2020-10-18 NOTE — ED Provider Notes (Signed)
[facility] EMERGENCY DEPARTMENT Provider Note   CSN: [id number] Arrival date & time: 10/18/20  1202     History Chief Complaint  Patient presents with   Myrtis Ser is a 87 y.o. female.  Pt complains of fall two days ago. Pt is notably a poor historian. When asked why she is in the ER today cannot seem to come up with a reason.  Contacted patient's daughter who was on the way to the ER.  Patient has apparently had a fall 5 days ago that was relatively low impact.  Then had a fall 4 days ago where she fell backwards 4 stairs.  She has been at her baseline ability to walk which is poor and requires a walker and assistance sometimes uses a wheelchair      Past Medical History:  Diagnosis Date   Allergy    Anemia    Anxiety    Arthritis    Blood transfusion without reported diagnosis    Cataract    a. bilerteral cataracts removed   COPD (chronic obstructive pulmonary disease) ([place])    Coronary atherosclerosis of native coronary artery    a. 2003 Cath/unsuccessful PCI of occluded RCA;  b. 2005 NSTEMI/Cath: CTO RCA w/ L->R collats, LAD 20, LCX 61m nl EF;  c. 11/2011 Myoview: no ischemia.   Diastolic dysfunction [id number]  a. 11/2011 Echo: Ejection fraction 60-65%, gr1 DD, basal inferior AK;  b. 09/2013 Echo: EF 55-60%, mild LVH, no rwma, Gr1 DD, mildly dil LA.   Gastritis    a. 04/2016 EGD: Nl esophagus, gastritis, nl duodenal bulb & 2nd portion of the duodenum.   Headache(784.0)    Hyperlipidemia    Hypertension    Hypokalemia    Tubular adenoma of colon 2013    Patient Active Problem List   Diagnosis Date Noted   Memory changes 05/10/2020   Chronic obstructive pulmonary disease ([place] 02/05/2020   Sensation of fullness in left ear 02/05/2020   Pain in both feet 01/04/2020   Acute respiratory failure with hypoxia ([place] 12/23/2019   Pneumonia of both lungs due to infectious organism 12/23/2019   Chronic diastolic CHF (congestive heart failure)  ([place] 12/23/2019   Sepsis ([place] 12/23/2019   Left bundle branch block 12/23/2019   Cough 10/27/2019   Pain of right lower extremity 06/26/2019   Dizziness 06/26/2019   Herpes zoster without complication [id number]  Angular cheilitis 05/04/2018   Hemorrhoids 04/11/2018   Lower abdominal pain 04/11/2018   Osteoporosis 11/15/2017   Prediabetes 11/15/2017   Presbycusis of both ears 11/16/2016   Anemia 06/20/2016   Dyspnea    NSTEMI (non-ST elevated myocardial infarction) ([place] 06/14/2016   Unstable angina ([place] 06/14/2016   Hx of adenomatous colonic polyps 04/06/2016   GERD without esophagitis 10/29/2015   IBS (irritable bowel syndrome) 10/29/2015   Urinary incontinence 10/29/2015   Osteoarthritis [id number]  Diastolic dysfunction [id number]  Chest pain 11/23/2011   Chronic pain of left knee 01/08/2011   HYPOKALEMIA 07/31/2009   Mixed hyperlipidemia 05/22/2009   Anxiety and depression 05/22/2009   Essential hypertension 05/22/2009   Coronary artery disease involving native coronary artery of native heart 05/22/2009    Past Surgical History:  Procedure Laterality Date   CARPAL TUNNEL RELEASE Bilateral    CHOLECYSTECTOMY  2004   COLONOSCOPY  08/06/2011   Procedure: COLONOSCOPY;  Surgeon: SDanie Binder MD;  [facility];  Service: Endoscopy;  Laterality: N/A;  10:40  AM   CORONARY ANGIOGRAPHY N/A 08/02/2017   Procedure: CORONARY ANGIOGRAPHY;  Surgeon: Lorretta Harp, MD;  Location: [place] [facility] LAB;  Service: Cardiovascular;  Laterality: N/A;   GALLBLADDER SURGERY     LEFT HEART CATH AND CORONARY ANGIOGRAPHY N/A 06/15/2016   Procedure: Left Heart Cath and Coronary Angiography;  Surgeon: Lorretta Harp, MD;  Location: [place] [facility] LAB;  Service: Cardiovascular;  Laterality: N/A;   PARTIAL HYSTERECTOMY     repair of belly button     UMBILICAL HERNIA REPAIR     Umbilical hernia repair as a child     OB History   No obstetric history on file.     Family  History  Problem Relation Age of Onset   Coronary artery disease Father    Heart attack Father    Colon cancer Father    Coronary artery disease Mother    Stomach cancer Mother    Heart disease Brother    Diabetes Brother    Pancreatic cancer Neg Hx    Rectal cancer Neg Hx    Esophageal cancer Neg Hx     Social History   Tobacco Use   Smoking status: Former    Packs/day: 0.50    Years: 25.00    Pack years: 12.50    Types: Cigarettes    Quit date: 04/06/1996    Years since quitting: 24.5   Smokeless tobacco: Never  Vaping Use   Vaping Use: Never used  Substance Use Topics   Alcohol use: No   Drug use: No    Home Medications Prior to Admission medications   Medication Sig Start Date End Date Taking? Authorizing Provider  albuterol (PROVENTIL) (2.5 MG/3ML) 0.083% nebulizer solution Take 3 mLs (2.5 mg total) by nebulization every 6 (six) hours as needed for wheezing or shortness of breath. 02/26/20   Icard, Leory Plowman L, DO  amLODipine (NORVASC) 2.5 MG tablet TAKE 1 TABLET BY MOUTH EVERY DAY 03/25/20   Lorretta Harp, MD  aspirin EC 81 MG tablet Take 81 mg by mouth every morning.    [provider]  atenolol (TENORMIN) 25 MG tablet TAKE 1 TABLET BY MOUTH EVERY DAY 03/25/20   Lorretta Harp, MD  atorvastatin (LIPITOR) 80 MG tablet TAKE 1 TABLET BY MOUTH EVERY DAY IN THE EVENING FOR CHOLESTEROL 08/29/20   [name], NP  busPIRone (BUSPAR) 7.5 MG tablet NEW PRESCRIPTION REQUEST: TAKE ONE TABLET BY MOUTH TWICE DAILY 01/19/20   [name], NP  calcium carbonate (OS-CAL) 600 MG TABS tablet Take 1,200 mg by mouth daily.    [provider]  cetirizine (ZYRTEC) 10 MG tablet Take 10 mg by mouth daily.    [provider]  clopidogrel (PLAVIX) 75 MG tablet NEW PRESCRIPTION REQUEST: TAKE ONE TABLET BY MOUTH DAILY 01/19/20   [name], NP  famotidine (PEPCID) 20 MG tablet Take 20 mg by mouth daily.     [provider]  fluticasone  (FLONASE) 50 MCG/ACT nasal spray PLACE 1 SPRAY INTO BOTH NOSTRILS 2 (TWO) TIMES DAILY 03/27/20   [name], NP  Fluticasone-Umeclidin-Vilant (TRELEGY ELLIPTA) 100-62.5-25 MCG/INH AEPB Inhale 1 puff into the lungs daily. 02/26/20   Icard, Octavio Graves, DO  gabapentin (NEURONTIN) 100 MG capsule TAKE 1 CAPSULE BY MOUTH EVERY MORNING AND 2 CAPSULES BY MOUTH EVERY NIGHT FOR FOOT PAIN. 09/20/20   [name], NP  isosorbide mononitrate (IMDUR) 120 MG 24 hr tablet Take 60 mg by mouth in  the morning.    [provider]  Melatonin 5 MG TABS Take 5 mg by mouth at bedtime as needed (sleep).     [provider]  nitroGLYCERIN (NITROSTAT) 0.4 MG SL tablet PLACE 1 TABLET UNDER THE TONGUE EVERY 5 MIN AS NEEDED FOR CHEST PAIN 10/03/20   Lorretta Harp, MD  pantoprazole (PROTONIX) 40 MG tablet TAKE 1 TABLET BY MOUTH ONCE DAILY FOR HEARTBURN. 04/09/20   [name], NP  PROAIR HFA 108 [phone] Base) MCG/ACT inhaler NEW PRESCRIPTION REQUEST: INHALE ONE PUFF BY MOUTH EVERY FOUR HOURS AS NEEDED 01/19/20   [name], NP  sertraline (ZOLOFT) 100 MG tablet TAKE 1 TABLET BY MOUTH ONCE DAILY FOR DEPRESSION/ANXIETY. 09/04/20   [name], NP  solifenacin (VESICARE) 5 MG tablet TAKE 1 TABLET (5 MG TOTAL) BY MOUTH DAILY. FOR OVERACTIVE BLADDER. 04/09/20   [name], NP  spironolactone (ALDACTONE) 25 MG tablet TAKE 1/2 TABLET BY MOUTH EVERY DAY 09/04/20   Lorretta Harp, MD    Allergies    Ranexa [ranolazine]  Review of Systems   Review of Systems  Constitutional:  Negative for fever.  HENT:  Negative for ear pain.   Eyes:  Negative for pain.  Respiratory:  Negative for cough.   Cardiovascular:  Negative for chest pain.  Gastrointestinal:  Negative for abdominal pain.  Genitourinary:  Negative for flank pain.  Musculoskeletal:  Negative for back pain.  Skin:  Negative for rash.  Neurological:  Negative for headaches.   Physical Exam Updated Vital Signs BP 138/84    Pulse 60   Temp 98 F (36.7 C) (Oral)   Resp 17   SpO2 96%   Physical Exam Constitutional:      General: She is not in acute distress.    Appearance: Normal appearance.  HENT:     Head: Normocephalic.     Comments: No head laceration seen but some tenderness and mild swelling of the soft tissue on the left occipital region.    Nose: Nose normal.  Eyes:     Extraocular Movements: Extraocular movements intact.  Cardiovascular:     Rate and Rhythm: Normal rate.  Pulmonary:     Effort: Pulmonary effort is normal.  Musculoskeletal:     Cervical back: Normal range of motion.     Comments: Your T or L-spine midline step-offs or tenderness noted.  Patient able to range bilateral upper extremities without pain.  Some pain on palpation to the left mid femur and movement of left hip but appears mild.  Neurovascularly intact otherwise.  Compartments are soft.  Neurological:     General: No focal deficit present.     Mental Status: She is alert. Mental status is at baseline.    ED Results / Procedures / Treatments   Labs (all labs ordered are listed, but only abnormal results are displayed) Labs Reviewed  CBC WITH DIFFERENTIAL/PLATELET - Abnormal; Notable for the following components:      Result Value   RBC 3.21 (*)    Hemoglobin 11.2 (*)    HCT 34.1 (*)    MCV 106.2 (*)    MCH 34.9 (*)    All other components within normal limits  COMPREHENSIVE METABOLIC PANEL - Abnormal; Notable for the following components:   Glucose, Bld 205 (*)    All other components within normal limits  PROTIME-INR  TROPONIN I (HIGH SENSITIVITY)  TROPONIN I (HIGH SENSITIVITY)    EKG EKG Interpretation  Date/Time:  Friday  October 18 2020 13:26:05 EDT Ventricular Rate:  59 PR Interval:  215 QRS Duration: 149 QT Interval:  465 QTC Calculation: 461 R Axis:   37 Text Interpretation: Sinus rhythm Borderline prolonged PR interval Right bundle branch block Confirmed by Thamas Jaegers ([id number]) on 10/18/2020  2:20:21 PM  Radiology DG Chest 2 View  Result Date: 10/18/2020 CLINICAL DATA:  Acute thoracic spine pain after fall 2 days ago. EXAM: CHEST - 2 VIEW COMPARISON:  February 05, 2020. FINDINGS: Mild cardiomegaly is noted. No pneumothorax or pleural effusion is noted. Stable elevated left hemidiaphragm is noted. Left lingular subsegmental atelectasis is noted. Right lung is clear. Bony thorax is unremarkable. IMPRESSION: Stable elevated left hemidiaphragm. Left lingular subsegmental atelectasis is noted. Aortic Atherosclerosis (ICD10-I70.0). Electronically Signed   By: Marijo Conception M.D.   On: 10/18/2020 13:33   CT HEAD WO CONTRAST (5MM)  Result Date: 10/18/2020 CLINICAL DATA:  Head and neck trauma after fall 2 days ago. EXAM: CT HEAD WITHOUT CONTRAST CT CERVICAL SPINE WITHOUT CONTRAST TECHNIQUE: Multidetector CT imaging of the head and cervical spine was performed following the standard protocol without intravenous contrast. Multiplanar CT image reconstructions of the cervical spine were also generated. COMPARISON:  None. FINDINGS: CT HEAD FINDINGS Brain: Mild chronic ischemic white matter disease is noted. No mass effect or midline shift is noted. Ventricular size is within normal limits. There is no evidence of mass lesion, hemorrhage or acute infarction. Vascular: No hyperdense vessel or unexpected calcification. Skull: Normal. Negative for fracture or focal lesion. Sinuses/Orbits: No acute finding. Other: Small left posterior scalp hematoma is noted. CT CERVICAL SPINE FINDINGS Alignment: Minimal grade 1 retrolisthesis of C4-5 is noted secondary to moderate degenerative disc disease at this level. Skull base and vertebrae: No acute fracture. No primary bone lesion or focal pathologic process. Soft tissues and spinal canal: No prevertebral fluid or swelling. No visible canal hematoma. Disc levels: Moderate degenerative disc disease is noted at C3-4, C4-5, C5-6 and C6-7. Upper chest: Negative. Other: None.  IMPRESSION: Small left posterior scalp hematoma. No acute intracranial abnormality seen. Moderate multilevel degenerative disc disease. No acute abnormality seen in the cervical spine. Electronically Signed   By: Marijo Conception M.D.   On: 10/18/2020 13:43   CT Cervical Spine Wo Contrast  Result Date: 10/18/2020 CLINICAL DATA:  Head and neck trauma after fall 2 days ago. EXAM: CT HEAD WITHOUT CONTRAST CT CERVICAL SPINE WITHOUT CONTRAST TECHNIQUE: Multidetector CT imaging of the head and cervical spine was performed following the standard protocol without intravenous contrast. Multiplanar CT image reconstructions of the cervical spine were also generated. COMPARISON:  None. FINDINGS: CT HEAD FINDINGS Brain: Mild chronic ischemic white matter disease is noted. No mass effect or midline shift is noted. Ventricular size is within normal limits. There is no evidence of mass lesion, hemorrhage or acute infarction. Vascular: No hyperdense vessel or unexpected calcification. Skull: Normal. Negative for fracture or focal lesion. Sinuses/Orbits: No acute finding. Other: Small left posterior scalp hematoma is noted. CT CERVICAL SPINE FINDINGS Alignment: Minimal grade 1 retrolisthesis of C4-5 is noted secondary to moderate degenerative disc disease at this level. Skull base and vertebrae: No acute fracture. No primary bone lesion or focal pathologic process. Soft tissues and spinal canal: No prevertebral fluid or swelling. No visible canal hematoma. Disc levels: Moderate degenerative disc disease is noted at C3-4, C4-5, C5-6 and C6-7. Upper chest: Negative. Other: None. IMPRESSION: Small left posterior scalp hematoma. No acute intracranial abnormality seen. Moderate multilevel degenerative disc  disease. No acute abnormality seen in the cervical spine. Electronically Signed   By: Marijo Conception M.D.   On: 10/18/2020 13:43   DG HIP UNILAT WITH PELVIS 2-3 VIEWS LEFT  Result Date: 10/18/2020 CLINICAL DATA:  Acute left hip  pain after fall 2 days ago. EXAM: DG HIP (WITH OR WITHOUT PELVIS) 2-3V LEFT COMPARISON:  None. FINDINGS: There is no evidence of hip fracture or dislocation. There is no evidence of arthropathy or other focal bone abnormality. IMPRESSION: Negative. Electronically Signed   By: Marijo Conception M.D.   On: 10/18/2020 13:32    Procedures Procedures   Medications Ordered in ED Medications - No data to display  ED Course  I have reviewed the triage vital signs and the nursing notes.  Pertinent labs & imaging results that were available during my care of the patient were reviewed by me and considered in my medical decision making (see chart for details).    MDM Rules/Calculators/A&P                           Labs unremarkable white count and chemistry normal.  Additional imaging studies were ordered which were all unremarkable for any acute findings.  Patient advised continued outpatient follow-up.  Advised her to use her wheelchair more often.  Advised immediate return for worsening pain fevers or any additional concerns.  Final Clinical Impression(s) / ED Diagnoses Final diagnoses:  Left hip pain  Fall, initial encounter  Injury of head, initial encounter    Rx / DC Orders ED Discharge Orders     None        Luna Fuse, MD 10/18/20 1427

## 2020-10-18 NOTE — ED Notes (Signed)
Pt in X ray

## 2020-10-19 ENCOUNTER — Telehealth: Payer: Self-pay | Admitting: *Deleted

## 2020-10-19 NOTE — Progress Notes (Signed)
10/18/2020: Family taking pt to ER-

## 2020-10-19 NOTE — Telephone Encounter (Signed)
CSW made initial outreach call and spoke briefly with daughter, [name], who reported pt had several falls and they had just been advised to take her to the ER for evaluation.  Encouraged Marva to proceed with that and to call me back when convenient.   Eduard Clos MSW, LCSW Licensed Clinical Social Worker [place] [phone]

## 2020-10-21 ENCOUNTER — Telehealth: Payer: Medicare Other

## 2020-10-22 ENCOUNTER — Ambulatory Visit: Payer: Medicare Other | Admitting: *Deleted

## 2020-10-22 ENCOUNTER — Ambulatory Visit (INDEPENDENT_AMBULATORY_CARE_PROVIDER_SITE_OTHER): Payer: Medicare Other

## 2020-10-22 DIAGNOSIS — J449 Chronic obstructive pulmonary disease, unspecified: Secondary | ICD-10-CM

## 2020-10-22 DIAGNOSIS — I10 Essential (primary) hypertension: Secondary | ICD-10-CM

## 2020-10-22 DIAGNOSIS — R413 Other amnesia: Secondary | ICD-10-CM

## 2020-10-22 DIAGNOSIS — R296 Repeated falls: Secondary | ICD-10-CM

## 2020-10-22 DIAGNOSIS — I2510 Atherosclerotic heart disease of native coronary artery without angina pectoris: Secondary | ICD-10-CM | POA: Diagnosis not present

## 2020-10-22 NOTE — Chronic Care Management (AMB) (Signed)
Chronic Care Management   CCM RN Visit Note  10/22/2020 Name: Cindy Brandt MRN: [id number] DOB: 20-Feb-1932  Subjective: Cindy Brandt is a 87 y.o. year old female who is a primary care patient of [name], NP. The care management team was consulted for assistance with disease management and care coordination needs.    Engaged with patient by telephone for follow up visit in response to provider referral for case management and/or care coordination services.   Consent to Services:  The patient was given information about Chronic Care Management services, agreed to services, and gave verbal consent prior to initiation of services.  Please see initial visit note for detailed documentation.   Patient agreed to services and verbal consent obtained.   Assessment: Review of patient past medical history, allergies, medications, health status, including review of consultants reports, laboratory and other test data, was performed as part of comprehensive evaluation and provision of chronic care management services.   SDOH (Social Determinants of Health) assessments and interventions performed:    CCM Care Plan  Allergies  Allergen Reactions   Ranexa [Ranolazine] Other (See Comments)    Does NOT "agree" with the patient- does not feel like herself    Outpatient Encounter Medications as of 10/22/2020  Medication Sig Note   albuterol (PROVENTIL) (2.5 MG/3ML) 0.083% nebulizer solution Take 3 mLs (2.5 mg total) by nebulization every 6 (six) hours as needed for wheezing or shortness of breath.    amLODipine (NORVASC) 2.5 MG tablet TAKE 1 TABLET BY MOUTH EVERY DAY    aspirin EC 81 MG tablet Take 81 mg by mouth every morning.    atenolol (TENORMIN) 25 MG tablet TAKE 1 TABLET BY MOUTH EVERY DAY    atorvastatin (LIPITOR) 80 MG tablet TAKE 1 TABLET BY MOUTH EVERY DAY IN THE EVENING FOR CHOLESTEROL    busPIRone (BUSPAR) 7.5 MG tablet NEW PRESCRIPTION REQUEST: TAKE ONE TABLET BY MOUTH TWICE DAILY     calcium carbonate (OS-CAL) 600 MG TABS tablet Take 1,200 mg by mouth daily.    cetirizine (ZYRTEC) 10 MG tablet Take 10 mg by mouth daily.    clopidogrel (PLAVIX) 75 MG tablet NEW PRESCRIPTION REQUEST: TAKE ONE TABLET BY MOUTH DAILY    famotidine (PEPCID) 20 MG tablet Take 20 mg by mouth daily.     fluticasone (FLONASE) 50 MCG/ACT nasal spray PLACE 1 SPRAY INTO BOTH NOSTRILS 2 (TWO) TIMES DAILY    Fluticasone-Umeclidin-Vilant (TRELEGY ELLIPTA) 100-62.5-25 MCG/INH AEPB Inhale 1 puff into the lungs daily.    gabapentin (NEURONTIN) 100 MG capsule TAKE 1 CAPSULE BY MOUTH EVERY MORNING AND 2 CAPSULES BY MOUTH EVERY NIGHT FOR FOOT PAIN.    isosorbide mononitrate (IMDUR) 120 MG 24 hr tablet Take 60 mg by mouth in the morning. 12/23/2019: 0.5 tablet = 60 mg   Melatonin 5 MG TABS Take 5 mg by mouth at bedtime as needed (sleep).     nitroGLYCERIN (NITROSTAT) 0.4 MG SL tablet PLACE 1 TABLET UNDER THE TONGUE EVERY 5 MIN AS NEEDED FOR CHEST PAIN    pantoprazole (PROTONIX) 40 MG tablet TAKE 1 TABLET BY MOUTH ONCE DAILY FOR HEARTBURN.    PROAIR HFA 108 (90 Base) MCG/ACT inhaler NEW PRESCRIPTION REQUEST: INHALE ONE PUFF BY MOUTH EVERY FOUR HOURS AS NEEDED    sertraline (ZOLOFT) 100 MG tablet TAKE 1 TABLET BY MOUTH ONCE DAILY FOR DEPRESSION/ANXIETY.    solifenacin (VESICARE) 5 MG tablet TAKE 1 TABLET (5 MG TOTAL) BY MOUTH DAILY. FOR OVERACTIVE BLADDER.  spironolactone (ALDACTONE) 25 MG tablet TAKE 1/2 TABLET BY MOUTH EVERY DAY    No facility-administered encounter medications on file as of 10/22/2020.    Patient Active Problem List   Diagnosis Date Noted   Memory changes 05/10/2020   Chronic obstructive pulmonary disease ([place]) 02/05/2020   Sensation of fullness in left ear 02/05/2020   Pain in both feet 01/04/2020   Acute respiratory failure with hypoxia ([place]) 12/23/2019   Pneumonia of both lungs due to infectious organism 12/23/2019   Chronic diastolic CHF (congestive heart failure) ([place]) 12/23/2019    Sepsis ([place]) 12/23/2019   Left bundle branch block 12/23/2019   Cough 10/27/2019   Pain of right lower extremity 06/26/2019   Dizziness 06/26/2019   Herpes zoster without complication [id number]   Angular cheilitis 05/04/2018   Hemorrhoids 04/11/2018   Lower abdominal pain 04/11/2018   Osteoporosis 11/15/2017   Prediabetes 11/15/2017   Presbycusis of both ears 11/16/2016   Anemia 06/20/2016   Dyspnea    NSTEMI (non-ST elevated myocardial infarction) ([place]) 06/14/2016   Unstable angina ([place]) 06/14/2016   Hx of adenomatous colonic polyps 04/06/2016   GERD without esophagitis 10/29/2015   IBS (irritable bowel syndrome) 10/29/2015   Urinary incontinence 10/29/2015   Osteoarthritis [id number]   Diastolic dysfunction [id number]   Chest pain 11/23/2011   Chronic pain of left knee 01/08/2011   HYPOKALEMIA 07/31/2009   Mixed hyperlipidemia 05/22/2009   Anxiety and depression 05/22/2009   Essential hypertension 05/22/2009   Coronary artery disease involving native coronary artery of native heart 05/22/2009    Conditions to be addressed/monitored:COPD and Falls  Care Plan : Fall Risk (Adult)  Updates made by Dannielle Karvonen, RN since 10/22/2020 12:00 AM     Problem: Fall Risk   Priority: High     Long-Range Goal: Absence of Fall and Fall-Related Injury   Start Date: 08/06/2020  Expected End Date: 01/29/2021  This Visit's Progress: On track  Recent Progress: On track  Priority: High  Note:   Current Barriers: Telephone call with patients daughter/ designated party release, [name].  Daughter states patient sustained 2 falls within the past 1 1/2 week.  She reports patient going to the ED on 10/18/2020.  No severe injuries.  Daughter reports patient continues to use walker/ wheelchair for ambulation.  Daughter states she spoke with social worker today and they discussed possible lift chair and in home camera's for additional resource support for patient.  She states patient  already has a medical alert and cell phone.  Daughter states the goal for now is to continue to keep patient in the home.  Knowledge Deficits related to fall precautions in patient  Unable to self administer medications as prescribed:  Daughter states patient will take her medications but there are times that she wakes up late and may or may not take her morning medications. Daughter states she and the family had hired someone previously 3 days a week to help patient with her care/ medications but patient would not let them do anything to assist her.   Unable to perform ADLs independently Cognitive Deficits No Advanced Directives in place: Advanced directive packet mailed to patient at last outreach with [facility].  Clinical Goal(s):  patient will demonstrate improved adherence to prescribed treatment plan for decreasing falls as evidenced by patient reporting and review of EMR patient Altamese Dilling will verbalize using fall risk reduction strategies discussed patient will not experience additional falls patient will attend  scheduled medical appointments the patient will demonstrate  ongoing self health care management ability as evidenced by adhering to fall precaution strategies and using ambulatory devices as recommended. the patient/ caregiver will work with the care management team towards completion of advanced directives Interventions:  Collaboration with [name], NP regarding development and update of comprehensive plan of care as evidenced by provider attestation and co-signature Inter-disciplinary care team collaboration (see longitudinal plan of care) Provided  verbal education re: Potential causes of falls and Fall prevention strategies Reviewed and discussed medications Assessed for s/s of orthostatic hypotension Assessed for falls since last encounter.  Assessed patients caregivers knowledge of fall risk prevention. Assessed working status of life alert bracelet/ medical alert  and patient adherence:  Daughter states patient does not wear her medical alert equipment consistently.  Reviewed scheduled/upcoming provider appointments  Discussed plans with patient for ongoing care management follow up and provided patient with direct contact information for care management team Self-Care Deficits:  Unable to self administer medications as prescribed Unable to perform ADLs independently Patient Goals:  - Continue to utilize walker and / or wheelchair appropriately with all ambulation -  Always keep walkways de-cluttered.  - Change positions slowly (from laying to sitting, from sitting to standing to help with decreasing dizziness).  Monitor blood pressure at least 3 times per week.  -  Always wear secure fitting shoes at all times with ambulation -  Always utilize home lighting for dim lit areas - Keep items commonly used within reach  Follow Up Plan: The patient has been provided with contact information for the care management team and has been advised to call with any health related questions or concerns.  The care management team will reach out to the patient again over the next 45 days.         Care Plan : COPD (Adult)  Updates made by Dannielle Karvonen, RN since 10/22/2020 12:00 AM     Problem: Symptom Exacerbation (COPD)   Priority: High     Long-Range Goal: Symptom Exacerbation Prevented or Minimized   Start Date: 08/06/2020  Expected End Date: 01/29/2021  This Visit's Progress: On track  Recent Progress: On track  Priority: High  Note:   Current Barriers: Telephone call with patients daughter/ designated party release, [name].  Daughter states patient is not having any issues regarding her COPD. She states patient continues to wear her oxygen for the most part.  She states she has gone to the house and found her without her oxygen on. Daughter states she checked patients oxygen saturation at that time and found patient to have an O2 sat of 94%.   Daughter states patient is scheduled to go to the cardiologist on 10/24/2020 for follow up. Daughter states she wants to speak with patient's doctors to determine if patient needs to continue with oxygen.  Knowledge deficits related to basic COPD self care/management Cognitive Deficits Unable to self administer medications as prescribed Unable to perform ADLs independently Unable to perform IADLs independently  Case Manager Clinical Goal(s): Patient/ caregiver will verbalize understanding of COPD action plan and when to seek appropriate levels of medical care Patient / caregiver will verbalize basic understanding of COPD disease process and self care activities  Interventions:  Collaboration with [name], NP regarding development and update of comprehensive plan of care as evidenced by provider attestation and co-signature Inter-disciplinary care team collaboration (see longitudinal plan of care) Provided patient/ caregiver with verbal COPD education on self care/management/and exacerbation prevention  Provided patient/caregiver with COPD  action plan and reinforced importance of daily self assessment:  Discussed with daughter ongoing monitoring of COPD symptoms. Advised to call provider for any mild to moderate changes and call 911 for severe symptoms.   Advised patient / caregiver to self assesses COPD action plan zone and make appointment with provider if in the yellow zone for 48 hours without improvement. Provided education about and advised patient to utilize infection prevention strategies to reduce risk of respiratory infection  Reviewed medications with caregiver Mailed Advance directive packet to patient/ caregiver:  RNCM will follow up with patient's daughter, [name] regarding receiving Advance Directive packet.  Patient Goals: - Take your medications as prescribed.  - eliminate symptom triggers at home that will cause COPD to flare up - Call your doctor for new or  ongoing mild to moderate symptoms. Call 911 for severe, life threatening symptoms, call your provider for mild/ moderate COPD symptoms.  -  Continue to keep follow-up appointments with your providers - use an extra pillow to sleep if needed - listen for public air quality announcements every day Follow Up Plan: The patient has been provided with contact information for the care management team and has been advised to call with any health related questions or concerns.  The care management team will reach out to the patient again over the next 45 days.           Plan:The patient has been provided with contact information for the care management team and has been advised to call with any health related questions or concerns.  and The care management team will reach out to the patient again over the next 45 days. Quinn Plowman RN,BSN,CCM RN Case Manager [place]  [phone]

## 2020-10-22 NOTE — Patient Instructions (Signed)
Visit Information:  Thank you for taking the time to speak with me today.   PATIENT GOALS:  Goals Addressed             This Visit's Progress    Prevent Falls and Injury   On track    Timeframe:  Long-Range Goal Priority:  High Start Date:    08/06/2020                         Expected End Date:  01/29/2021                  Follow Up Date 12/09/2020   - Continue to utilize walker and / or wheelchair appropriately with all ambulation -  Always keep walkways de-cluttered.  - Change positions slowly (from laying to sitting, from sitting to standing to help with decreasing dizziness).  Monitor blood pressure at least 3 times per week.  -  Always wear secure fitting shoes at all times with ambulation -  Always utilize home lighting for dim lit areas - Keep items commonly used within reach    Why is this important?   Most falls happen when it is hard for you to walk safely. Your balance may be off because of an illness. You may have pain in your knees, hip or other joints.  You may be overly tired or taking medicines that make you sleepy. You may not be able to see or hear clearly.  Falls can lead to broken bones, bruises or other injuries.  There are things you can do to help prevent falling.          Track and Manage My Symptoms-COPD   On track    Timeframe:  Long-Range Goal Priority:  High Start Date:  08/06/2020                           Expected End Date:   01/29/2021                  Follow Up Date 12/09/2020  - Take your medications as prescribed.  - eliminate symptom triggers at home that will cause COPD to flare up - Call your doctor for new or ongoing mild to moderate symptoms. Call 911 for severe, life threatening symptoms, call your provider for mild/ moderate COPD symptoms.  -  Continue to keep follow-up appointments with your providers - use an extra pillow to sleep if needed - listen for public air quality announcements every day   Why is this important?    Tracking your symptoms and other information about your health helps your doctor plan your care.  Write down the symptoms, the time of day, what you were doing and what medicine you are taking.  You will soon learn how to manage your symptoms.             Patient verbalizes understanding of instructions provided today and agrees to view in [place].   The patient has been provided with contact information for the care management team and has been advised to call with any health related questions or concerns.  The care management team will reach out to the patient again over the next 45 days.   Quinn Plowman RN,BSN,CCM RN Case Manager [place]  [phone]

## 2020-10-23 NOTE — Progress Notes (Signed)
Cardiology Clinic Note   Patient Name: Cindy Brandt Date of Encounter: 10/24/2020  Primary Care Provider:  [name], NP Primary Cardiologist:  Quay Burow, MD  Patient Profile    Cindy Brandt 87 year old female presents the clinic today for follow-up evaluation of her essential hypertension and coronary artery disease.  Past Medical History    Past Medical History:  Diagnosis Date   Allergy    Anemia    Anxiety    Arthritis    Blood transfusion without reported diagnosis    Cataract    a. bilerteral cataracts removed   COPD (chronic obstructive pulmonary disease) ([place])    Coronary atherosclerosis of native coronary artery    a. 2003 Cath/unsuccessful PCI of occluded RCA;  b. 2005 NSTEMI/Cath: CTO RCA w/ L->R collats, LAD 20, LCX 45m nl EF;  c. 11/2011 Myoview: no ischemia.   Diastolic dysfunction [id number]  a. 11/2011 Echo: Ejection fraction 60-65%, gr1 DD, basal inferior AK;  b. 09/2013 Echo: EF 55-60%, mild LVH, no rwma, Gr1 DD, mildly dil LA.   Gastritis    a. 04/2016 EGD: Nl esophagus, gastritis, nl duodenal bulb & 2nd portion of the duodenum.   Headache(784.0)    Hyperlipidemia    Hypertension    Hypokalemia    Tubular adenoma of colon 2013   Past Surgical History:  Procedure Laterality Date   CARPAL TUNNEL RELEASE Bilateral    CHOLECYSTECTOMY  2004   COLONOSCOPY  08/06/2011   Procedure: COLONOSCOPY;  Surgeon: SDanie Binder MD;  [facility];  Service: Endoscopy;  Laterality: N/A;  10:40 AM   CORONARY ANGIOGRAPHY N/A 08/02/2017   Procedure: CORONARY ANGIOGRAPHY;  Surgeon: BLorretta Harp MD;  Location: [facility];  Service: Cardiovascular;  Laterality: N/A;   GALLBLADDER SURGERY     LEFT HEART CATH AND CORONARY ANGIOGRAPHY N/A 06/15/2016   Procedure: Left Heart Cath and Coronary Angiography;  Surgeon: JLorretta Harp MD;  [facility];  Service: Cardiovascular;  Laterality: N/A;   PARTIAL HYSTERECTOMY     repair  of belly button     UMBILICAL HERNIA REPAIR     Umbilical hernia repair as a child    Allergies  Allergies  Allergen Reactions   Ranexa [Ranolazine] Other (See Comments)    Does NOT "agree" with the patient- does not feel like herself    History of Present Illness    Cindy RUFFINOhas a PMH of HLD, HTN, CAD, diastolic dysfunction, LBBB, and unstable angina.  She underwent cardiac catheterization in 2005 which showed CTO of her RCA and nonobstructive CAD in her LAD and circumflex.  [facility] chest pain and troponin elevation.  She underwent cardiac catheterization 06/15/2016 which showed occluded RCA with left-right collaterals, occluded nondominant circumflex, high-grade ostial ramus branch stenosis and 50% proximal LAD stenosis.  Her echocardiogram at that time showed normal LV function with G2 DD.  Medical management was recommended.  Her PMH also includes anxiety and GERD.  She was seen by Dr. BGwenlyn Foundon 03/13/2020.  During that time she had been admitted to the hospital with pneumonia and was discharged home on oxygen.  She was housebound.  She complained of shortness of breath but denied chest discomfort.  She sustained a fall 10/18/2020 and presented to the emergency department for evaluation.  She reported that she had fallen 2 days prior.  The fall was  felt to be low impact.  She felt backwards and down 4 stairs.  She was at her functional baseline.  She required a walker and occasionally uses a wheelchair.  Her EKG showed sinus rhythm with prolonged PR interval RBBB 59 bpm.  Lab work was unremarkable.  CT head showed small left posterior scalp hematoma with no acute intercranial abnormalities and moderate multilevel degenerative disc disease of her cervical spine.  She was discharged in stable condition on 10/18/2020.  She presents to the clinic today for follow-up evaluation states she feels fairly well.  She  presents with her daughter.  She does indicate that she has been taking increased doses of sublingual nitroglycerin.  She reports that she takes nitroglycerin 1-2 times per week when she notices chest discomfort during activity.  She has only been taking 60 mg of her Imdur daily.  She also notices some numbness in her left lower extremity.  She also notes lower extremity claudication with each episode of ambulation.  I will order LEA, ABIs, and increase her Imdur to 120 mg daily.  I will have her increase her physical activity as tolerated and follow-up after her lower extremity ultrasound.  Today she denies chest pain, shortness of breath, lower extremity edema, fatigue, palpitations, melena, hematuria, hemoptysis, diaphoresis, weakness, presyncope, syncope, orthopnea, and PND.   Home Medications    Prior to Admission medications   Medication Sig Start Date End Date Taking? Authorizing Provider  albuterol (PROVENTIL) (2.5 MG/3ML) 0.083% nebulizer solution Take 3 mLs (2.5 mg total) by nebulization every 6 (six) hours as needed for wheezing or shortness of breath. 02/26/20   Icard, Leory Plowman L, DO  amLODipine (NORVASC) 2.5 MG tablet TAKE 1 TABLET BY MOUTH EVERY DAY 03/25/20   Lorretta Harp, MD  aspirin EC 81 MG tablet Take 81 mg by mouth every morning.    [provider]  atenolol (TENORMIN) 25 MG tablet TAKE 1 TABLET BY MOUTH EVERY DAY 03/25/20   Lorretta Harp, MD  atorvastatin (LIPITOR) 80 MG tablet TAKE 1 TABLET BY MOUTH EVERY DAY IN THE EVENING FOR CHOLESTEROL 08/29/20   [name], NP  busPIRone (BUSPAR) 7.5 MG tablet NEW PRESCRIPTION REQUEST: TAKE ONE TABLET BY MOUTH TWICE DAILY 01/19/20   [name], NP  calcium carbonate (OS-CAL) 600 MG TABS tablet Take 1,200 mg by mouth daily.    [provider]  cetirizine (ZYRTEC) 10 MG tablet Take 10 mg by mouth daily.    [provider]  clopidogrel (PLAVIX) 75 MG tablet NEW PRESCRIPTION REQUEST: TAKE ONE  TABLET BY MOUTH DAILY 01/19/20   [name], NP  famotidine (PEPCID) 20 MG tablet Take 20 mg by mouth daily.     [provider]  fluticasone (FLONASE) 50 MCG/ACT nasal spray PLACE 1 SPRAY INTO BOTH NOSTRILS 2 (TWO) TIMES DAILY 03/27/20   [name], NP  Fluticasone-Umeclidin-Vilant (TRELEGY ELLIPTA) 100-62.5-25 MCG/INH AEPB Inhale 1 puff into the lungs daily. 02/26/20   Icard, Octavio Graves, DO  gabapentin (NEURONTIN) 100 MG capsule TAKE 1 CAPSULE BY MOUTH EVERY MORNING AND 2 CAPSULES BY MOUTH EVERY NIGHT FOR FOOT PAIN. 09/20/20   [name], NP  isosorbide mononitrate (IMDUR) 120 MG 24 hr tablet Take 60 mg by mouth in the morning.    [provider]  Melatonin 5 MG TABS Take 5 mg by mouth at bedtime as needed (sleep).     [provider]  nitroGLYCERIN (NITROSTAT) 0.4 MG SL tablet PLACE 1 TABLET  UNDER THE TONGUE EVERY 5 MIN AS NEEDED FOR CHEST PAIN 10/03/20   Lorretta Harp, MD  pantoprazole (PROTONIX) 40 MG tablet TAKE 1 TABLET BY MOUTH ONCE DAILY FOR HEARTBURN. 04/09/20   [name], NP  PROAIR HFA 108 [phone] Base) MCG/ACT inhaler NEW PRESCRIPTION REQUEST: INHALE ONE PUFF BY MOUTH EVERY FOUR HOURS AS NEEDED 01/19/20   [name], NP  sertraline (ZOLOFT) 100 MG tablet TAKE 1 TABLET BY MOUTH ONCE DAILY FOR DEPRESSION/ANXIETY. 09/04/20   [name], NP  solifenacin (VESICARE) 5 MG tablet TAKE 1 TABLET (5 MG TOTAL) BY MOUTH DAILY. FOR OVERACTIVE BLADDER. 04/09/20   [name], NP  spironolactone (ALDACTONE) 25 MG tablet TAKE 1/2 TABLET BY MOUTH EVERY DAY 09/04/20   Lorretta Harp, MD    Family History    Family History  Problem Relation Age of Onset   Coronary artery disease Father    Heart attack Father    Colon cancer Father    Coronary artery disease Mother    Stomach cancer Mother    Heart disease Brother    Diabetes Brother    Pancreatic cancer Neg Hx    Rectal cancer Neg Hx    Esophageal cancer Neg Hx    She  indicated that her mother is deceased. She indicated that her father is alive. She indicated that the status of her brother is unknown. She indicated that the status of her neg hx is unknown.  Social History    Social History   Socioeconomic History   Marital status: Widowed    Spouse name: Not on file   Number of children: 5   Years of education: Not on file   Highest education level: Not on file  Occupational History   Occupation: Retired  Tobacco Use   Smoking status: Former    Packs/day: 0.50    Years: 25.00    Pack years: 12.50    Types: Cigarettes    Quit date: 04/06/1996    Years since quitting: 24.5   Smokeless tobacco: Never  Vaping Use   Vaping Use: Never used  Substance and Sexual Activity   Alcohol use: No   Drug use: No   Sexual activity: Not on file  Other Topics Concern   Not on file  Social History Narrative   Currently living with dtr in [place].  No regular exercise.   Social Determinants of Health   Financial Resource Strain: Low Risk    Difficulty of Paying Living Expenses: Not hard at all  Food Insecurity: No Food Insecurity   Worried About Charity fundraiser in the Last Year: Never true   [place] in the Last Year: Never true  Transportation Needs: No Transportation Needs   Lack of Transportation (Medical): No   Lack of Transportation (Non-Medical): No  Physical Activity: Not on file  Stress: Not on file  Social Connections: Not on file  Intimate Partner Violence: Not on file     Review of Systems    General:  No chills, fever, night sweats or weight changes.  Cardiovascular:  No chest pain, dyspnea on exertion, edema, orthopnea, palpitations, paroxysmal nocturnal dyspnea. Dermatological: No rash, lesions/masses Respiratory: No cough, dyspnea Urologic: No hematuria, dysuria Abdominal:   No nausea, vomiting, diarrhea, bright red blood per rectum, melena, or hematemesis Neurologic:  No visual changes, wkns, changes in mental status. All  other systems reviewed and are otherwise negative except as noted above.  Physical Exam  VS:  BP 120/70 (BP Location: Right Arm, Patient Position: Sitting, Cuff Size: Normal)   Pulse 62   Ht 5' (1.524 m)   Wt 138 lb 12.8 oz (63 kg)   SpO2 96%   BMI 27.11 kg/m  , BMI Body mass index is 27.11 kg/m. GEN: Well nourished, well developed, in no acute distress. HEENT: normal. Neck: Supple, no JVD, carotid bruits, or masses. Cardiac: RRR, no murmurs, rubs, or gallops. No clubbing, cyanosis, edema.  Radials/and equal bilaterally.  No pulse noted by Doppler DP or PT, bilateral feet warm and dry. Respiratory:  Respirations regular and unlabored, clear to auscultation bilaterally. GI: Soft, nontender, nondistended, BS + x 4. MS: no deformity or atrophy. Skin: warm and dry, no rash. Neuro:  Strength and sensation are intact. Psych: Normal affect.  Accessory Clinical Findings    Recent Labs: 10/27/2019: Pro B Natriuretic peptide (BNP) 97.0 12/24/2019: Magnesium 1.8 12/25/2019: B Natriuretic Peptide 96.1 10/18/2020: ALT 12; BUN 14; Creatinine, Ser 0.84; Hemoglobin 11.2; Platelets 208; Potassium 4.0; Sodium 137   Recent Lipid Panel    Component Value Date/Time   CHOL 122 04/22/2020 0000   TRIG 122 04/22/2020 0000   HDL 46 04/22/2020 0000   CHOLHDL 2 12/05/2018 0948   VLDL 12.8 12/05/2018 0948   LDLCALC 56 04/22/2020 0000    ECG personally reviewed by me today-none today.  Echocardiogram 06/15/2016 Study Conclusions   - Left ventricle: The cavity size was normal. Wall thickness was    increased in a pattern of moderate LVH. There was focal basal    hypertrophy. Systolic function was normal. The estimated ejection    fraction was in the range of 60% to 65%. Features are consistent    with a pseudonormal left ventricular filling pattern, with    concomitant abnormal relaxation and increased filling pressure    (grade 2 diastolic dysfunction).  - Mitral valve: There was mild  regurgitation.  - Left atrium: The atrium was mildly dilated.  - Pulmonary arteries: Systolic pressure was mildly increased. PA    peak pressure: 37 mm Hg (S).   Cardiac catheterization 08/29/2017  Ost LM lesion is 45% stenosed. Prox LAD to Mid LAD lesion is 50% stenosed. Ost Ramus lesion is 95% stenosed. Ost Cx to Prox Cx lesion is 100% stenosed. Mid RCA lesion is 100% stenosed.   Cindy Brandt is a 87 y.o. female      [id number] LOCATION:  FACILITY: [facility]  PHYSICIAN: Quay Burow, M.D. 21-Apr-1931  Diagnostic Dominance: Right Intervention  Assessment & Plan    Essential hypertension-BP today 120/70.  Well-controlled at home. Continue amlodipine, atenolol, spironolactone Heart healthy low-sodium diet-salty 6 given Increase physical activity as tolerated  Coronary artery disease-reports using sublingual nitroglycerin 1-2 times per week with activities.  Underwent cardiac catheterization 08/02/2017 which showed occluded dominant RCA with left-to-right collaterals, occluded nondominant circumflex with moderate left main disease.  Medical management was recommended.  Echocardiogram 4/18 showed normal LV function.  Continues to be on 2 L nasal cannula and denies chest pain or shortness of breath.  Daughter reports she is only been taking 60 mg of Imdur daily. Continue amlodipine, aspirin, atenolol, Plavix,  nitroglycerin, atorvastatin Increase Imdur to 120 mg daily Heart healthy low-sodium diet-salty 6 given Increase physical activity as tolerated  Hyperlipidemia-04/22/2020: Cholesterol 122; HDL 46; LDL Cholesterol 56; Triglycerides 122 Continue aspirin, atorvastatin, Plavix Heart healthy low-sodium high-fiber diet Increase physical activity as tolerated  Disposition: Follow-up with Dr. Gwenlyn Found in  2 months.   Denyse Amass  Dagmar Hait NP-C    10/24/2020, 2:57 PM [address] Office [phone] Fax [phone]  Notice: This  dictation was prepared with Dragon dictation along with smaller phrase technology. Any transcriptional errors that result from this process are unintentional and may not be corrected upon review.  I spent 15 minutes examining this patient, reviewing medications, and using patient centered shared decision making involving her cardiac care.  Prior to her visit I spent greater than 20 minutes reviewing her past medical history,  medications, and prior cardiac tests.

## 2020-10-23 NOTE — Patient Instructions (Signed)
Visit Information  PATIENT GOALS:  Goals Addressed               This Visit's Progress     Find Help in My Community (pt-stated)        Timeframe:  Long-Range Goal Priority:  High Start Date:    10/22/20                         Expected End Date:            12/29/20           Follow Up Date 11/11/20   -consider home adaptations for safety; chair lift, "alexa type monitor", etc - begin a notebook of services in my neighborhood or community - call 211 when I need some help - follow-up on any referrals for help I am given - think ahead to make sure my need does not become an emergency - have a back-up plan - make a list of family or friends that I can call    Why is this important?   Knowing how and where to find help for yourself or family in your neighborhood and community is an important skill.  You will want to take some steps to learn how.    Notes:         The patient verbalized understanding of instructions, educational materials, and care plan provided today and declined offer to receive copy of patient instructions, educational materials, and care plan.   Telephone follow up appointment with care management team member scheduled for:11/11/20  Eduard Clos MSW, [place] Licensed Clinical Social Worker [facility] [phone]

## 2020-10-23 NOTE — Chronic Care Management (AMB) (Signed)
Chronic Care Management    Clinical Social Work Note  10/23/2020 Name: Cindy Brandt MRN: [id number] DOB: 07-Apr-1931  Cindy Brandt is a 87 y.o. year old female who is a primary care patient of [name], NP. The CCM team was consulted to assist the patient with chronic disease management and/or care coordination needs related to: [employer] , Level of Care Concerns, and Caregiver Stress.   Engaged with patient by telephone for initial visit in response to provider referral for social work chronic care management and care coordination services.   Consent to Services:  The patient was given information about Chronic Care Management services, agreed to services, and gave verbal consent prior to initiation of services.  Please see initial visit note for detailed documentation.   Patient agreed to services and consent obtained.   Assessment: Review of patient past medical history, allergies, medications, and health status, including review of relevant consultants reports was performed today as part of a comprehensive evaluation and provision of chronic care management and care coordination services.     SDOH (Social Determinants of Health) assessments and interventions performed:    Advanced Directives Status: Not addressed in this encounter.  CCM Care Plan  Allergies  Allergen Reactions   Ranexa [Ranolazine] Other (See Comments)    Does NOT "agree" with the patient- does not feel like herself    Outpatient Encounter Medications as of 10/22/2020  Medication Sig Note   albuterol (PROVENTIL) (2.5 MG/3ML) 0.083% nebulizer solution Take 3 mLs (2.5 mg total) by nebulization every 6 (six) hours as needed for wheezing or shortness of breath.    amLODipine (NORVASC) 2.5 MG tablet TAKE 1 TABLET BY MOUTH EVERY DAY    aspirin EC 81 MG tablet Take 81 mg by mouth every morning.    atenolol (TENORMIN) 25 MG tablet TAKE 1 TABLET BY MOUTH EVERY DAY    atorvastatin (LIPITOR) 80 MG tablet  TAKE 1 TABLET BY MOUTH EVERY DAY IN THE EVENING FOR CHOLESTEROL    busPIRone (BUSPAR) 7.5 MG tablet NEW PRESCRIPTION REQUEST: TAKE ONE TABLET BY MOUTH TWICE DAILY    calcium carbonate (OS-CAL) 600 MG TABS tablet Take 1,200 mg by mouth daily.    cetirizine (ZYRTEC) 10 MG tablet Take 10 mg by mouth daily.    clopidogrel (PLAVIX) 75 MG tablet NEW PRESCRIPTION REQUEST: TAKE ONE TABLET BY MOUTH DAILY    famotidine (PEPCID) 20 MG tablet Take 20 mg by mouth daily.     fluticasone (FLONASE) 50 MCG/ACT nasal spray PLACE 1 SPRAY INTO BOTH NOSTRILS 2 (TWO) TIMES DAILY    Fluticasone-Umeclidin-Vilant (TRELEGY ELLIPTA) 100-62.5-25 MCG/INH AEPB Inhale 1 puff into the lungs daily.    gabapentin (NEURONTIN) 100 MG capsule TAKE 1 CAPSULE BY MOUTH EVERY MORNING AND 2 CAPSULES BY MOUTH EVERY NIGHT FOR FOOT PAIN.    isosorbide mononitrate (IMDUR) 120 MG 24 hr tablet Take 60 mg by mouth in the morning. 12/23/2019: 0.5 tablet = 60 mg   Melatonin 5 MG TABS Take 5 mg by mouth at bedtime as needed (sleep).     nitroGLYCERIN (NITROSTAT) 0.4 MG SL tablet PLACE 1 TABLET UNDER THE TONGUE EVERY 5 MIN AS NEEDED FOR CHEST PAIN    pantoprazole (PROTONIX) 40 MG tablet TAKE 1 TABLET BY MOUTH ONCE DAILY FOR HEARTBURN.    PROAIR HFA 108 (90 Base) MCG/ACT inhaler NEW PRESCRIPTION REQUEST: INHALE ONE PUFF BY MOUTH EVERY FOUR HOURS AS NEEDED    sertraline (ZOLOFT) 100 MG tablet TAKE 1 TABLET BY  MOUTH ONCE DAILY FOR DEPRESSION/ANXIETY.    solifenacin (VESICARE) 5 MG tablet TAKE 1 TABLET (5 MG TOTAL) BY MOUTH DAILY. FOR OVERACTIVE BLADDER.    spironolactone (ALDACTONE) 25 MG tablet TAKE 1/2 TABLET BY MOUTH EVERY DAY    No facility-administered encounter medications on file as of 10/22/2020.    Patient Active Problem List   Diagnosis Date Noted   Memory changes 05/10/2020   Chronic obstructive pulmonary disease ([place]) 02/05/2020   Sensation of fullness in left ear 02/05/2020   Pain in both feet 01/04/2020   Acute respiratory failure  with hypoxia ([place]) 12/23/2019   Pneumonia of both lungs due to infectious organism 12/23/2019   Chronic diastolic CHF (congestive heart failure) ([place]) 12/23/2019   Sepsis ([place]) 12/23/2019   Left bundle branch block 12/23/2019   Cough 10/27/2019   Pain of right lower extremity 06/26/2019   Dizziness 06/26/2019   Herpes zoster without complication [id number]   Angular cheilitis 05/04/2018   Hemorrhoids 04/11/2018   Lower abdominal pain 04/11/2018   Osteoporosis 11/15/2017   Prediabetes 11/15/2017   Presbycusis of both ears 11/16/2016   Anemia 06/20/2016   Dyspnea    NSTEMI (non-ST elevated myocardial infarction) ([place]) 06/14/2016   Unstable angina ([place]) 06/14/2016   Hx of adenomatous colonic polyps 04/06/2016   GERD without esophagitis 10/29/2015   IBS (irritable bowel syndrome) 10/29/2015   Urinary incontinence 10/29/2015   Osteoarthritis [id number]   Diastolic dysfunction [id number]   Chest pain 11/23/2011   Chronic pain of left knee 01/08/2011   HYPOKALEMIA 07/31/2009   Mixed hyperlipidemia 05/22/2009   Anxiety and depression 05/22/2009   Essential hypertension 05/22/2009   Coronary artery disease involving native coronary artery of native heart 05/22/2009    Conditions to be addressed/monitored:  level of care needs/concerns ; Level of care concerns, Limited access to caregiver, and Inability to perform IADL's independently  Care Plan : LCSW Plan of Care  Updates made by Deirdre Peer, LCSW since 10/23/2020 12:00 AM     Problem: Mobility, safety and Independence   Priority: High     Long-Range Goal: Mobility and Independence Optimized   Start Date: 10/22/2020  Expected End Date: 12/29/2020  This Visit's Progress: On track  Priority: High  Note:   Current barriers:   Patient in need of assistance with connecting to community resources for Level of care concerns, ADL IADL limitations, Limited access to caregiver, Cognitive Deficits, Memory Deficits, and Inability  to perform ADL's independently Acknowledges deficits with meeting this unmet need Patient is unable to independently navigate community resource options without care coordination support Clinical Goals:  explore community resource options for unmet needs related to:Inability to perform IADL's independently, Facility placement (in future), and Memory Deficits  patient will work with SW to address concerns related to resources, support and guidance needing for current and long term care planning Clinical Interventions:  CSW spoke with pt's daughter, [name], to discuss care needs. [name] reports pt is living with her sister (pt's daughterDewaine Oats) who works and thus pt is home alone sometimes. Family is concerned about pt's safety being alone and in a home with bedrooms on 2nd floor.  Also, with pt being 89, on oxygen and her not wearing her life alert button they are concerned and seeking help. CSW discussed possible lift chair and other adaptations that may can be done to help. Daughter reports they do not want to pursue placement at this time but may need to do so in the future. CSW advised daughter  of the process for this as well as the levels of care, cost/coverage, Medicaid, etc.  Pt does own a home/property and thus this was discussed as a potential barrier to Medicaid/placement. CSW alerted daughter to guidelines with Medicaid and their "look back"; thus advising her to be cautious and seek legal or DSS guidance before doing anything with pt's assets.   Daughter was appreciative of the suggestions and information provided and will share with pt and her siblings (2 other daughters and a son). Family will reach out to [facility] if further questions or needs arise prior to follow up call planned for 11/11/20 Collaboration with Carlis Abbott, Leticia Penna, NP regarding development and update of comprehensive plan of care as evidenced by provider attestation and co-signature Inter-disciplinary care team collaboration (see  longitudinal plan of care) Assessment of needs, barriers , agencies contacted, as well as how impacting  Review various resources, discussed options and provided patient information about Level of care concerns, ADL IADL limitations, Limited access to caregiver, Cognitive Deficits, Inability to perform IADL's independently, and Lacks knowledge of community resources:   Collaborated with appropriate clinical care team members regarding patient needs Level of care concerns, ADL IADL limitations, Limited access to caregiver, and Cognitive Deficits, COPD and Dementia  Patient interviewed and appropriate assessments performed Provided patient with information about levels of care, Medicaid, etc Discussed plans with patient for ongoing care management follow up and provided patient with direct contact information for care management team Provided education to patient/caregiver regarding level of care options. Other interventions provided: Solution-Focused Strategies, Active listening / Reflection utilized , Problem Solving [facility] , Motivational Interviewing, and Caregiver stress acknowledged  Patient Goals:  -consider home adaptations for safety; chair lift, "alexa type monitor", etc - begin a notebook of services in my neighborhood or community - call 211 when I need some help - follow-up on any referrals for help I am given - think ahead to make sure my need does not become an emergency - have a back-up plan - make a list of family or friends that I can call  -  Follow Up Plan: Appointment scheduled for SW follow up with client by phone on: 11/11/20       Follow Up Plan: Appointment scheduled for SW follow up with client by phone on: 11/11/20      Eduard Clos MSW, [place] Licensed Clinical Social Worker [place] [phone]

## 2020-10-24 ENCOUNTER — Ambulatory Visit: Payer: Medicare Other | Admitting: General Practice

## 2020-10-24 ENCOUNTER — Encounter: Payer: Self-pay | Admitting: General Practice

## 2020-10-24 ENCOUNTER — Other Ambulatory Visit: Payer: Self-pay

## 2020-10-24 VITALS — BP 120/70 | HR 62 | Ht 60.0 in | Wt 138.8 lb

## 2020-10-24 DIAGNOSIS — I25119 Atherosclerotic heart disease of native coronary artery with unspecified angina pectoris: Secondary | ICD-10-CM | POA: Diagnosis not present

## 2020-10-24 DIAGNOSIS — E782 Mixed hyperlipidemia: Secondary | ICD-10-CM

## 2020-10-24 DIAGNOSIS — I739 Peripheral vascular disease, unspecified: Secondary | ICD-10-CM

## 2020-10-24 DIAGNOSIS — I10 Essential (primary) hypertension: Secondary | ICD-10-CM

## 2020-10-24 MED ORDER — ISOSORBIDE MONONITRATE ER 120 MG PO TB24: 120.0000 mg | ORAL_TABLET | Freq: Every morning | ORAL | 1 refills | Status: DC

## 2020-10-24 NOTE — Patient Instructions (Signed)
Medication Instructions:  INCREASE ISOSORBIDE '120MG'$  DAIL *If you need a refill on your cardiac medications before your next appointment, please call your pharmacy*  Lab Work: NONE  Testing/Procedures: Your physician has requested that you have an ankle brachial index (ABI). During this test an ultrasound and blood pressure cuff are used to evaluate the arteries that supply the arms and legs with blood. Allow thirty minutes for this exam. There are no restrictions or special instructions.  Special Instructions TAKE AND LOG YOUR BLOOD PRESSURE  PLEASE READ AND FOLLOW INCREASED FIBER DIET-ATTACHED  Follow-Up: Your next appointment:  AFTER ABI/LEA In Person with Quay Burow, MD OR IF UNAVAILABLE [place], FNP-C   At [facility], you and your health needs are our priority.  As part of our continuing mission to provide you with exceptional heart care, we have created designated Provider Care Teams.  These Care Teams include your primary Cardiologist (physician) and Advanced Practice Providers (APPs -  Physician Assistants and Nurse Practitioners) who all work together to provide you with the care you need, when you need it.    High-Fiber Eating Plan Fiber, also called dietary fiber, is a type of carbohydrate. It is found foods such as fruits, vegetables, whole grains, and beans. A high-fiber diet can have many health benefits. Your health care provider may recommend a high-fiber diet to help: Prevent constipation. Fiber can make your bowel movements more regular. Lower your cholesterol. Relieve the following conditions: Inflammation of veins in the anus (hemorrhoids). Inflammation of specific areas of the digestive tract (uncomplicated diverticulosis). A problem of the large intestine, also called the colon, that sometimes causes pain and diarrhea (irritable bowel syndrome, or IBS). Prevent overeating as part of a weight-loss plan. Prevent heart disease, type 2 diabetes, and  certain cancers. What are tips for following this plan? Reading food labels  Check the nutrition facts label on food products for the amount of dietary fiber. Choose foods that have 5 grams of fiber or more per serving. The goals for recommended daily fiber intake include: Men (age 62 or younger): 34-38 g. Men (over age 52): 28-34 g. Women (age 67 or younger): 25-28 g. Women (over age 37): 22-25 g. Shopping Choose whole fruits and vegetables instead of processed forms, such as apple juice or applesauce. Choose a wide variety of high-fiber foods such as avocados, lentils, oats, and kidney beans. Read the nutrition facts label of the foods you choose. Be aware of foods with added fiber. These foods often have high sugar and sodium amounts per serving. Cooking Use whole-grain flour for baking and cooking. Cook with brown rice instead of white rice. Meal planning Start the day with a breakfast that is high in fiber, such as a cereal that contains 5 g of fiber or more per serving. Eat breads and cereals that are made with whole-grain flour instead of refined flour or white flour. Eat brown rice, bulgur wheat, or millet instead of white rice. Use beans in place of meat in soups, salads, and pasta dishes. Be sure that half of the grains you eat each day are whole grains. General information You can get the recommended daily intake of dietary fiber by: Eating a variety of fruits, vegetables, grains, nuts, and beans. Taking a fiber supplement if you are not able to take in enough fiber in your diet. It is better to get fiber through food than from a supplement. Gradually increase how much fiber you consume. If you increase your intake of dietary fiber  too quickly, you may have bloating, cramping, or gas. Drink plenty of water to help you digest fiber. Choose high-fiber snacks, such as berries, raw vegetables, nuts, and popcorn. What foods should I eat? Fruits Berries. Pears. Apples. Oranges.  Avocado. Prunes and raisins. Dried figs. Vegetables Sweet potatoes. Spinach. Kale. Artichokes. Cabbage. Broccoli. Cauliflower.Green peas. Carrots. Squash. Grains Whole-grain breads. Multigrain cereal. Oats and oatmeal. Brown rice. Barley.Bulgur wheat. [place]. Quinoa. Bran muffins. Popcorn. Rye wafer crackers. Meats and other proteins Navy beans, kidney beans, and pinto beans. Soybeans. Split peas. Lentils. Nutsand seeds. Dairy Fiber-fortified yogurt. Beverages Fiber-fortified soy milk. Fiber-fortified orange juice. Other foods Fiber bars. The items listed above may not be a complete list of recommended foods and beverages. Contact a dietitian for more information. What foods should I avoid? Fruits Fruit juice. Cooked, strained fruit. Vegetables Fried potatoes. Canned vegetables. Well-cooked vegetables. Grains White bread. Pasta made with refined flour. White rice. Meats and other proteins Fatty cuts of meat. Fried chicken or fried fish. Dairy Milk. Yogurt. Cream cheese. Sour cream. Fats and oils Butters. Beverages Soft drinks. Other foods Cakes and pastries. The items listed above may not be a complete list of foods and beverages to avoid. Talk with your dietitian about what choices are best for you. Summary Fiber is a type of carbohydrate. It is found in foods such as fruits, vegetables, whole grains, and beans. A high-fiber diet has many benefits. It can help to prevent constipation, lower blood cholesterol, aid weight loss, and reduce your risk of heart disease, diabetes, and certain cancers. Increase your intake of fiber gradually. Increasing fiber too quickly may cause cramping, bloating, and gas. Drink plenty of water while you increase the amount of fiber you consume. The best sources of fiber include whole fruits and vegetables, whole grains, nuts, seeds, and beans. This information is not intended to replace advice given to you by your health care provider. Make sure you  discuss any questions you have with your healthcare provider. Document Revised: 06/22/2019 Document Reviewed: 06/22/2019 Elsevier Patient Education  2022 [employer].

## 2020-11-01 ENCOUNTER — Other Ambulatory Visit: Payer: Self-pay

## 2020-11-01 ENCOUNTER — Encounter: Payer: Self-pay | Admitting: Primary Care

## 2020-11-01 ENCOUNTER — Ambulatory Visit (INDEPENDENT_AMBULATORY_CARE_PROVIDER_SITE_OTHER): Payer: Medicare Other | Admitting: Primary Care

## 2020-11-01 VITALS — BP 124/62 | HR 61 | Temp 97.6°F | Ht 60.0 in | Wt 138.0 lb

## 2020-11-01 DIAGNOSIS — M79604 Pain in right leg: Secondary | ICD-10-CM | POA: Diagnosis not present

## 2020-11-01 DIAGNOSIS — M25562 Pain in left knee: Secondary | ICD-10-CM | POA: Diagnosis not present

## 2020-11-01 DIAGNOSIS — D649 Anemia, unspecified: Secondary | ICD-10-CM

## 2020-11-01 DIAGNOSIS — G8929 Other chronic pain: Secondary | ICD-10-CM

## 2020-11-01 DIAGNOSIS — M79671 Pain in right foot: Secondary | ICD-10-CM

## 2020-11-01 DIAGNOSIS — F32A Depression, unspecified: Secondary | ICD-10-CM

## 2020-11-01 DIAGNOSIS — I10 Essential (primary) hypertension: Secondary | ICD-10-CM | POA: Diagnosis not present

## 2020-11-01 DIAGNOSIS — J449 Chronic obstructive pulmonary disease, unspecified: Secondary | ICD-10-CM

## 2020-11-01 DIAGNOSIS — Z23 Encounter for immunization: Secondary | ICD-10-CM

## 2020-11-01 DIAGNOSIS — M159 Polyosteoarthritis, unspecified: Secondary | ICD-10-CM | POA: Diagnosis not present

## 2020-11-01 DIAGNOSIS — F419 Anxiety disorder, unspecified: Secondary | ICD-10-CM

## 2020-11-01 DIAGNOSIS — R32 Unspecified urinary incontinence: Secondary | ICD-10-CM

## 2020-11-01 DIAGNOSIS — I200 Unstable angina: Secondary | ICD-10-CM

## 2020-11-01 DIAGNOSIS — R296 Repeated falls: Secondary | ICD-10-CM

## 2020-11-01 DIAGNOSIS — R413 Other amnesia: Secondary | ICD-10-CM

## 2020-11-01 DIAGNOSIS — E782 Mixed hyperlipidemia: Secondary | ICD-10-CM | POA: Diagnosis not present

## 2020-11-01 DIAGNOSIS — R7303 Prediabetes: Secondary | ICD-10-CM

## 2020-11-01 DIAGNOSIS — M79672 Pain in left foot: Secondary | ICD-10-CM

## 2020-11-01 LAB — CBC
HCT: 33.3 % — ABNORMAL LOW (ref 36.0–46.0)
Hemoglobin: 11.3 g/dL — ABNORMAL LOW (ref 12.0–15.0)
MCHC: 33.9 g/dL (ref 30.0–36.0)
MCV: 102.4 fl — ABNORMAL HIGH (ref 78.0–100.0)
Platelets: 216 10*3/uL (ref 150.0–400.0)
RBC: 3.25 Mil/uL — ABNORMAL LOW (ref 3.87–5.11)
RDW: 12.2 % (ref 11.5–15.5)
WBC: 4.1 10*3/uL (ref 4.0–10.5)

## 2020-11-01 LAB — HEMOGLOBIN A1C: Hgb A1c MFr Bld: 6.5 % (ref 4.6–6.5)

## 2020-11-01 MED ORDER — GABAPENTIN 100 MG PO CAPS: 100.0000 mg | ORAL_CAPSULE | Freq: Every evening | ORAL | 1 refills | Status: DC | PRN

## 2020-11-01 NOTE — Assessment & Plan Note (Signed)
Well-controlled in the office today, continue Imdur 120 mg, spironolactone 12.5 mg daily, amlodipine 2.5 mg.

## 2020-11-01 NOTE — Progress Notes (Signed)
Subjective:    Patient ID: Cindy Brandt, female    DOB: 06-11-1931, 87 y.o.   MRN: [id number]  HPI  Cindy Brandt is a very pleasant 87 y.o. female with a history of hypertension, CAD, CHF, COPD, chronic respiratory failure, osteoarthritis,anemia, memory changes, urinary incontinence who presents today for follow up of chronic conditions.  Her daughter is here with her today providing most of the information for HPI.  The patient is followed by a Education officer, museum who is contacting them regularly.  Her daughter is appreciative of the service.  Compliant to continuous oxygen for the most part, her daughter has found her a few times not using. She is Trelegy inhaler daily, infrequent use of albuterol inhaler. She feels well managed on this regimen. Denies cough, shortness of breath, fevers.   She continues to fall, has fallen three times within the last two weeks. She is not using her walker regularly. She's undergone several rounds of home health physical therapy but she doesn't complete her PT homework and doesn't improve. Her daughters are very frustrated with her lack of willingness to care for herself.  Her daughter states that when there are others around she will have them do things for her, but when the patient is home alone she will do things on her own.  She is not taking her medications regularly, will sometimes lay in the bed until 3:30 pm and then take her morning medications. She is managed on gabapentin 100 mg, she will take 200 mg at night for pain, doesn't take any during the day.   Following with cardiology, last visit was last week. She is pending arterial duplex studies with ABI's. Imdur was increased to 120 mg from 60 mg due to chest pain and taking nitroglycerin frequently.  She continues to endorse lower extremity pain.  Managed on sertraline 100 mg daily and buspirone 7.5 mg BID. She endorses having good days and bad.  She endorses that she is sleeping a lot during the day. Her  daughter continues to notice short term memory loss, progressing over the last three months.  She underwent CT head a few weeks ago which was negative for intracranial abnormality.  She continues to experience urinary incontinence, is compliant to Vesicare 5 mg, endorses that it helps some.    BP Readings from Last 3 Encounters:  11/01/20 124/62  10/24/20 120/70  10/18/20 138/74        Review of Systems  Constitutional:  Negative for fever.  Respiratory:  Negative for cough and shortness of breath.   Cardiovascular:  Negative for chest pain and leg swelling.       Lower extremity pain bilaterally.  Musculoskeletal:  Negative for back pain.  Neurological:  Negative for dizziness and headaches.  Psychiatric/Behavioral:  The patient is not nervous/anxious.         Past Medical History:  Diagnosis Date   Allergy    Anemia    Anxiety    Arthritis    Blood transfusion without reported diagnosis    Cataract    a. bilerteral cataracts removed   COPD (chronic obstructive pulmonary disease) ([place])    Coronary atherosclerosis of native coronary artery    a. 2003 Cath/unsuccessful PCI of occluded RCA;  b. 2005 NSTEMI/Cath: CTO RCA w/ L->R collats, LAD 20, LCX 87m nl EF;  c. 11/2011 Myoview: no ischemia.   Diastolic dysfunction [id number]  a. 11/2011 Echo: Ejection fraction 60-65%, gr1 DD, basal inferior AK;  b. 09/2013 Echo:  EF 55-60%, mild LVH, no rwma, Gr1 DD, mildly dil LA.   Gastritis    a. 04/2016 EGD: Nl esophagus, gastritis, nl duodenal bulb & 2nd portion of the duodenum.   Headache(784.0)    Hyperlipidemia    Hypertension    Hypokalemia    Tubular adenoma of colon 2013    Social History   Socioeconomic History   Marital status: Widowed    Spouse name: Not on file   Number of children: 5   Years of education: Not on file   Highest education level: Not on file  Occupational History   Occupation: Retired  Tobacco Use   Smoking status: Former    Packs/day: 0.50     Years: 25.00    Pack years: 12.50    Types: Cigarettes    Quit date: 04/06/1996    Years since quitting: 24.5   Smokeless tobacco: Never  Vaping Use   Vaping Use: Never used  Substance and Sexual Activity   Alcohol use: No   Drug use: No   Sexual activity: Not on file  Other Topics Concern   Not on file  Social History Narrative   Currently living with dtr in [place].  No regular exercise.   Social Determinants of Health   Financial Resource Strain: Low Risk    Difficulty of Paying Living Expenses: Not hard at all  Food Insecurity: No Food Insecurity   Worried About Charity fundraiser in the Last Year: Never true   [place] in the Last Year: Never true  Transportation Needs: No Transportation Needs   Lack of Transportation (Medical): No   Lack of Transportation (Non-Medical): No  Physical Activity: Not on file  Stress: Not on file  Social Connections: Not on file  Intimate Partner Violence: Not on file    Past Surgical History:  Procedure Laterality Date   CARPAL TUNNEL RELEASE Bilateral    CHOLECYSTECTOMY  2004   COLONOSCOPY  08/06/2011   Procedure: COLONOSCOPY;  Surgeon: [name], MD;  [facility];  Service: Endoscopy;  Laterality: N/A;  10:40 AM   CORONARY ANGIOGRAPHY N/A 08/02/2017   Procedure: CORONARY ANGIOGRAPHY;  Surgeon: Lorretta Harp, MD;  Location: [facility];  Service: Cardiovascular;  Laterality: N/A;   GALLBLADDER SURGERY     LEFT HEART CATH AND CORONARY ANGIOGRAPHY N/A 06/15/2016   Procedure: Left Heart Cath and Coronary Angiography;  Surgeon: Lorretta Harp, MD;  Location: [facility];  Service: Cardiovascular;  Laterality: N/A;   PARTIAL HYSTERECTOMY     repair of belly button     UMBILICAL HERNIA REPAIR     Umbilical hernia repair as a child    Family History  Problem Relation Age of Onset   Coronary artery disease Father    Heart attack Father    Colon cancer Father    Coronary artery disease Mother     Stomach cancer Mother    Heart disease Brother    Diabetes Brother    Pancreatic cancer Neg Hx    Rectal cancer Neg Hx    Esophageal cancer Neg Hx     Allergies  Allergen Reactions   Ranexa [Ranolazine] Other (See Comments)    Does NOT "agree" with the patient- does not feel like herself    Current Outpatient Medications on File Prior to Visit  Medication Sig Dispense Refill   albuterol (PROVENTIL) (2.5 MG/3ML) 0.083% nebulizer solution Take 3 mLs (2.5 mg total) by nebulization  every 6 (six) hours as needed for wheezing or shortness of breath. 360 mL 5   amLODipine (NORVASC) 2.5 MG tablet TAKE 1 TABLET BY MOUTH EVERY DAY 90 tablet 1   aspirin EC 81 MG tablet Take 81 mg by mouth every morning.     atenolol (TENORMIN) 25 MG tablet TAKE 1 TABLET BY MOUTH EVERY DAY 90 tablet 2   atorvastatin (LIPITOR) 80 MG tablet TAKE 1 TABLET BY MOUTH EVERY DAY IN THE EVENING FOR CHOLESTEROL 90 tablet 1   busPIRone (BUSPAR) 7.5 MG tablet NEW PRESCRIPTION REQUEST: TAKE ONE TABLET BY MOUTH TWICE DAILY 180 tablet 3   calcium carbonate (OS-CAL) 600 MG TABS tablet Take 1,200 mg by mouth daily.     cetirizine (ZYRTEC) 10 MG tablet Take 10 mg by mouth daily.     clopidogrel (PLAVIX) 75 MG tablet NEW PRESCRIPTION REQUEST: TAKE ONE TABLET BY MOUTH DAILY 90 tablet 3   famotidine (PEPCID) 20 MG tablet Take 20 mg by mouth daily.      fluticasone (FLONASE) 50 MCG/ACT nasal spray PLACE 1 SPRAY INTO BOTH NOSTRILS 2 (TWO) TIMES DAILY 16 mL 5   Fluticasone-Umeclidin-Vilant (TRELEGY ELLIPTA) 100-62.5-25 MCG/INH AEPB Inhale 1 puff into the lungs daily. 60 each 4   gabapentin (NEURONTIN) 100 MG capsule TAKE 1 CAPSULE BY MOUTH EVERY MORNING AND 2 CAPSULES BY MOUTH EVERY NIGHT FOR FOOT PAIN. 270 capsule 1   isosorbide mononitrate (IMDUR) 120 MG 24 hr tablet Take 1 tablet (120 mg total) by mouth in the morning. 90 tablet 1   Melatonin 5 MG TABS Take 5 mg by mouth at bedtime as needed (sleep).      nitroGLYCERIN (NITROSTAT) 0.4  MG SL tablet PLACE 1 TABLET UNDER THE TONGUE EVERY 5 MIN AS NEEDED FOR CHEST PAIN 25 tablet 3   pantoprazole (PROTONIX) 40 MG tablet TAKE 1 TABLET BY MOUTH ONCE DAILY FOR HEARTBURN. 90 tablet 3   PROAIR HFA 108 (90 Base) MCG/ACT inhaler NEW PRESCRIPTION REQUEST: INHALE ONE PUFF BY MOUTH EVERY FOUR HOURS AS NEEDED 25.5 g 3   sertraline (ZOLOFT) 100 MG tablet TAKE 1 TABLET BY MOUTH ONCE DAILY FOR DEPRESSION/ANXIETY. 90 tablet 1   solifenacin (VESICARE) 5 MG tablet TAKE 1 TABLET (5 MG TOTAL) BY MOUTH DAILY. FOR OVERACTIVE BLADDER. 90 tablet 3   spironolactone (ALDACTONE) 25 MG tablet TAKE 1/2 TABLET BY MOUTH EVERY DAY 45 tablet 3   No current facility-administered medications on file prior to visit.    BP 124/62   Pulse 61   Temp 97.6 F (36.4 C) (Temporal)   Ht 5' (1.524 m)   Wt 138 lb (62.6 kg)   SpO2 96%   BMI 26.95 kg/m  Objective:   Physical Exam Cardiovascular:     Rate and Rhythm: Normal rate and regular rhythm.  Pulmonary:     Effort: Pulmonary effort is normal.     Breath sounds: Normal breath sounds.  Musculoskeletal:     Cervical back: Neck supple.  Skin:    General: Skin is warm and dry.  Neurological:     Mental Status: She is alert and oriented to person, place, and time.     Comments: Hard of hearing, but answers questions correctly.  Follows all commands.  Psychiatric:        Mood and Affect: Mood normal.          Assessment & Plan:      This visit occurred during the SARS-CoV-2 public health emergency.  Safety protocols were in  place, including screening questions prior to the visit, additional usage of staff PPE, and extensive cleaning of exam room while observing appropriate contact time as indicated for disinfecting solutions.

## 2020-11-01 NOTE — Assessment & Plan Note (Signed)
Continues.  Gabapentin could be causing daytime drowsiness, will reduce to 100 mg at bedtime.  Patient will update.

## 2020-11-01 NOTE — Assessment & Plan Note (Signed)
Appears stable, mostly compliant to continuous oxygen.  Discussed to continue oxygen daily and nightly. Continue Trelegy inhaler daily, continue as needed albuterol.

## 2020-11-01 NOTE — Assessment & Plan Note (Signed)
Progressing per daughter.  We discussed medication treatment, however are trying to reduce her pill count to help with drowsiness.  Daughter would like to hold off on any treatment for memory at this point.  Encouraged the patient to avoid sleeping during the day, try reading and puzzles.  She was hard of hearing, but did answer all questions correctly and followed all commands.

## 2020-11-01 NOTE — Patient Instructions (Addendum)
Stop taking the buspirone (Buspar) medication for anxiety.   We changed your gabapentin medication for pain. Take 1 capsule by mouth at bedtime for pain if needed. This medication can cause drowsiness.  Stop by the lab prior to leaving today. I will notify you of your results once received.   It was a pleasure to see you today!       Influenza (Flu) Vaccine (Inactivated or Recombinant): What You Need to Know 1. Why get vaccinated? Influenza vaccine can prevent influenza (flu). Flu is a contagious disease that spreads around the [place] every year, usually between October and May. Anyone can get the flu, but it is more dangerous for some people. Infants and young children, people 9 years and older, pregnant people, and people with certain health conditions or a weakened immune system are at greatest risk of flu complications. Pneumonia, bronchitis, sinus infections, and ear infections are examples of flu-related complications. If you have a medical condition, such as heart disease, cancer, or diabetes, flu can make it worse. Flu can cause fever and chills, sore throat, muscle aches, fatigue, cough, headache, and runny or stuffy nose. Some people may have vomiting and diarrhea, though this is more common in children than adults. In an average year, thousands of people in the [place] die from flu, and many more are hospitalized. Flu vaccine prevents millions of illnesses and flu-related visits to the doctor each year. 2. Influenza vaccines CDC recommends everyone 6 months and older get vaccinated every flu season. Children 6 months through 5 years of age may need 2 doses during a single flu season. Everyone else needs only 1 dose each flu season. It takes about 2 weeks for protection to develop after vaccination. There are many flu viruses, and they are always changing. Each year a new flu vaccine is made to protect against the influenza viruses believed to be likely to cause disease  in the upcoming flu season. Even when the vaccine doesn't exactly match these viruses, it may still provide some protection. Influenza vaccine does not cause flu. Influenza vaccine may be given at the same time as other vaccines. 3. Talk with your health care provider Tell your vaccination provider if the person getting the vaccine: Has had an allergic reaction after a previous dose of influenza vaccine, or has any severe, life-threatening allergies Has ever had Guillain-Barr Syndrome (also called "GBS") In some cases, your health care provider may decide to postpone influenza vaccination until a future visit. Influenza vaccine can be administered at any time during pregnancy. People who are or will be pregnant during influenza season should receive inactivated influenza vaccine. People with minor illnesses, such as a cold, may be vaccinated. People who are moderately or severely ill should usually wait until they recover before getting influenza vaccine. Your health care provider can give you more information. 4. Risks of a vaccine reaction Soreness, redness, and swelling where the shot is given, fever, muscle aches, and headache can happen after influenza vaccination. There may be a very small increased risk of Guillain-Barr Syndrome (GBS) after inactivated influenza vaccine (the flu shot). Young children who get the flu shot along with pneumococcal vaccine (PCV13) and/or DTaP vaccine at the same time might be slightly more likely to have a seizure caused by fever. Tell your health care provider if a child who is getting flu vaccine has ever had a seizure. People sometimes faint after medical procedures, including vaccination. Tell your provider if you feel dizzy or have vision  changes or ringing in the ears. As with any medicine, there is a very remote chance of a vaccine causing a severe allergic reaction, other serious injury, or death. 5. What if there is a serious problem? An allergic  reaction could occur after the vaccinated person leaves the clinic. If you see signs of a severe allergic reaction (hives, swelling of the face and throat, difficulty breathing, a fast heartbeat, dizziness, or weakness), call 9-1-1 and get the person to the nearest hospital. For other signs that concern you, call your health care provider. Adverse reactions should be reported to the Vaccine Adverse Event Reporting System (VAERS). Your health care provider will usually file this report, or you can do it yourself. Visit the VAERS website at www.vaers.SamedayNews.es or call 520-077-3899. VAERS is only for reporting reactions, and VAERS staff members do not give medical advice. 6. The National Vaccine Injury Compensation Program The [employer] Vaccine Injury Compensation Program (VICP) is a federal program that was created to compensate people who may have been injured by certain vaccines. Claims regarding alleged injury or death due to vaccination have a time limit for filing, which may be as short as two years. Visit the VICP website at GoldCloset.com.ee or call 443-790-8182 to learn about the program and about filing a claim. 7. How can I learn more? Ask your health care provider. Call your local or state health department. Visit the website of the Food and Drug Administration (FDA) for vaccine package inserts and additional information at TraderRating.uy. Contact the Centers for Disease Control and Prevention (CDC): Call 380-093-7769 (1-800-CDC-INFO) or Visit CDC's website at https://gibson.com/. Vaccine Information Statement Inactivated Influenza Vaccine (10/06/2019) This information is not intended to replace advice given to you by your health care provider. Make sure you discuss any questions you have with your health care provider. Document Revised: 11/23/2019 Document Reviewed: 11/23/2019 Elsevier Patient Education  2022 [employer].

## 2020-11-01 NOTE — Assessment & Plan Note (Signed)
Chronic and continued. Pending vascular studies including ABIs.  Will reduce gabapentin to 100 mg at bedtime as this could be contributing to lingering daytime drowsiness the following day.  She will update

## 2020-11-01 NOTE — Assessment & Plan Note (Signed)
Lipid panel from February 2022 reviewed, continue atorvastatin 80 mg

## 2020-11-01 NOTE — Assessment & Plan Note (Signed)
Pending vascular imaging including ABIs.  Continue gabapentin 100 mg, will reduce to 100 mg at bedtime given the potential cause for her daytime drowsiness.

## 2020-11-01 NOTE — Assessment & Plan Note (Signed)
Strongly encourage patient to use her walker at all times when ambulatory.  Patient's family declines home health PT as patient hardly participates.  She is pending vascular imaging including ABIs to lower extremities.  If positive, this could be contributing to her falls as she denies dizziness and tripping over objects.

## 2020-11-01 NOTE — Assessment & Plan Note (Signed)
Noted on labs from hospital visit 2 weeks ago.  Repeat CBC pending.

## 2020-11-01 NOTE — Assessment & Plan Note (Signed)
Following with cardiology, Imdur increased to 120 mg recently.  Office notes reviewed from last week.

## 2020-11-01 NOTE — Assessment & Plan Note (Signed)
Chronic, patient endorses some improvement with Vesicare 5 mg, continue same.

## 2020-11-01 NOTE — Assessment & Plan Note (Signed)
Repeat A1c pending. 

## 2020-11-01 NOTE — Assessment & Plan Note (Signed)
Seems to be controlled, however she is a poor historian.  In an effort to reduce her pill count, will discontinue BuSpar 7.5 mg twice daily.  Continue sertraline 100 mg.  Her daughter agrees.

## 2020-11-08 ENCOUNTER — Other Ambulatory Visit: Payer: Self-pay | Admitting: Cardiovascular Disease

## 2020-11-08 NOTE — Chronic Care Management (AMB) (Signed)
  Chronic Care Management   Note  11/08/2020 Name: Cindy Brandt MRN: [id number] DOB: Mar 22, 1931   CSW spoke with pt's daughter briefly on 10/18/20 who reports they have been advised to take pt to the ER for evaluation; s/p fall. CSW rescheduled initial assessment for 10/22/20  Follow up plan: Telephone follow up appointment with care management team member scheduled for:10/22/20.    Eduard Clos MSW, LCSW Licensed Clinical Social Worker [place] [phone]

## 2020-11-11 ENCOUNTER — Ambulatory Visit (INDEPENDENT_AMBULATORY_CARE_PROVIDER_SITE_OTHER): Payer: Medicare Other | Admitting: *Deleted

## 2020-11-11 ENCOUNTER — Ambulatory Visit (HOSPITAL_COMMUNITY)
Admission: RE | Admit: 2020-11-11 | Discharge: 2020-11-11 | Disposition: A | Discharge status: Home / Self Care | Payer: Medicare Other | Source: Ambulatory Visit | Attending: Cardiology | Admitting: Cardiology

## 2020-11-11 ENCOUNTER — Other Ambulatory Visit: Payer: Self-pay

## 2020-11-11 DIAGNOSIS — R296 Repeated falls: Secondary | ICD-10-CM

## 2020-11-11 DIAGNOSIS — R413 Other amnesia: Secondary | ICD-10-CM

## 2020-11-11 DIAGNOSIS — M159 Polyosteoarthritis, unspecified: Secondary | ICD-10-CM

## 2020-11-11 DIAGNOSIS — J449 Chronic obstructive pulmonary disease, unspecified: Secondary | ICD-10-CM

## 2020-11-11 DIAGNOSIS — I739 Peripheral vascular disease, unspecified: Secondary | ICD-10-CM

## 2020-11-11 NOTE — Patient Instructions (Signed)
Visit Information  PATIENT GOALS:  Goals Addressed               This Visit's Progress     Find Help in My Community (pt-stated)        Timeframe:  Long-Range Goal Priority:  High Start Date:    10/22/20                         Expected End Date:            12/29/20           Follow Up Date 10/24//22    -consider contact with DSS, Attorney,etc re: Medicaid eligibilty - begin a notebook of services in my neighborhood or community - call 211 when I need some help - follow-up on any referrals for help I am given - think ahead to make sure my need does not become an emergency - have a back-up plan   Why is this important?   Knowing how and where to find help for yourself or family in your neighborhood and community is an important skill.  You will want to take some steps to learn how.    Notes:         The patient verbalized understanding of instructions, educational materials, and care plan provided today and declined offer to receive copy of patient instructions, educational materials, and care plan.   Telephone follow up appointment with care management team member scheduled for:12/23/20  Eduard Clos MSW, LCSW Licensed Clinical Social Worker [place] [phone]

## 2020-11-11 NOTE — Chronic Care Management (AMB) (Signed)
Chronic Care Management    Clinical Social Work Note  11/11/2020 Name: Cindy Brandt MRN: [id number] DOB: 09/29/1931  Cindy Brandt is a 87 y.o. year old female who is a primary care patient of [name], NP. The CCM team was consulted to assist the patient with chronic disease management and/or care coordination needs related to: [employer] , Level of Care Concerns, and Caregiver Stress.   Engaged with patient by telephone for follow up visit in response to provider referral for social work chronic care management and care coordination services.   Consent to Services:  The patient was given information about Chronic Care Management services, agreed to services, and gave verbal consent prior to initiation of services.  Please see initial visit note for detailed documentation.   Patient agreed to services and consent obtained.   Assessment: Review of patient past medical history, allergies, medications, and health status, including review of relevant consultants reports was performed today as part of a comprehensive evaluation and provision of chronic care management and care coordination services.     SDOH (Social Determinants of Health) assessments and interventions performed:    Advanced Directives Status: Not addressed in this encounter.  CCM Care Plan  Allergies  Allergen Reactions   Ranexa [Ranolazine] Other (See Comments)    Does NOT "agree" with the patient- does not feel like herself    Outpatient Encounter Medications as of 11/11/2020  Medication Sig   albuterol (PROVENTIL) (2.5 MG/3ML) 0.083% nebulizer solution Take 3 mLs (2.5 mg total) by nebulization every 6 (six) hours as needed for wheezing or shortness of breath.   amLODipine (NORVASC) 2.5 MG tablet TAKE 1 TABLET BY MOUTH EVERY DAY   aspirin EC 81 MG tablet Take 81 mg by mouth every morning.   atenolol (TENORMIN) 25 MG tablet TAKE 1 TABLET BY MOUTH EVERY DAY   atorvastatin (LIPITOR) 80 MG tablet TAKE 1  TABLET BY MOUTH EVERY DAY IN THE EVENING FOR CHOLESTEROL   calcium carbonate (OS-CAL) 600 MG TABS tablet Take 1,200 mg by mouth daily.   cetirizine (ZYRTEC) 10 MG tablet Take 10 mg by mouth daily.   clopidogrel (PLAVIX) 75 MG tablet NEW PRESCRIPTION REQUEST: TAKE ONE TABLET BY MOUTH DAILY   famotidine (PEPCID) 20 MG tablet Take 20 mg by mouth daily.    fluticasone (FLONASE) 50 MCG/ACT nasal spray PLACE 1 SPRAY INTO BOTH NOSTRILS 2 (TWO) TIMES DAILY   Fluticasone-Umeclidin-Vilant (TRELEGY ELLIPTA) 100-62.5-25 MCG/INH AEPB Inhale 1 puff into the lungs daily.   gabapentin (NEURONTIN) 100 MG capsule Take 1 capsule (100 mg total) by mouth at bedtime as needed. For pain.   isosorbide mononitrate (IMDUR) 120 MG 24 hr tablet Take 1 tablet (120 mg total) by mouth in the morning.   Melatonin 5 MG TABS Take 5 mg by mouth at bedtime as needed (sleep).    nitroGLYCERIN (NITROSTAT) 0.4 MG SL tablet PLACE 1 TABLET UNDER THE TONGUE EVERY 5 MIN AS NEEDED FOR CHEST PAIN   pantoprazole (PROTONIX) 40 MG tablet TAKE 1 TABLET BY MOUTH ONCE DAILY FOR HEARTBURN.   PROAIR HFA 108 (90 Base) MCG/ACT inhaler NEW PRESCRIPTION REQUEST: INHALE ONE PUFF BY MOUTH EVERY FOUR HOURS AS NEEDED   sertraline (ZOLOFT) 100 MG tablet TAKE 1 TABLET BY MOUTH ONCE DAILY FOR DEPRESSION/ANXIETY.   solifenacin (VESICARE) 5 MG tablet TAKE 1 TABLET (5 MG TOTAL) BY MOUTH DAILY. FOR OVERACTIVE BLADDER.   spironolactone (ALDACTONE) 25 MG tablet TAKE 1/2 TABLET BY MOUTH EVERY DAY  No facility-administered encounter medications on file as of 11/11/2020.    Patient Active Problem List   Diagnosis Date Noted   Recurrent falls 11/01/2020   Memory changes 05/10/2020   Chronic obstructive pulmonary disease ([place]) 02/05/2020   Sensation of fullness in left ear 02/05/2020   Pain in both feet 01/04/2020   Chronic diastolic CHF (congestive heart failure) ([place]) 12/23/2019   Left bundle branch block 12/23/2019   Cough 10/27/2019   Pain of right lower  extremity 06/26/2019   Dizziness 06/26/2019   Herpes zoster without complication [id number]   Angular cheilitis 05/04/2018   Hemorrhoids 04/11/2018   Lower abdominal pain 04/11/2018   Osteoporosis 11/15/2017   Prediabetes 11/15/2017   Presbycusis of both ears 11/16/2016   Anemia 06/20/2016   Dyspnea    NSTEMI (non-ST elevated myocardial infarction) ([place]) 06/14/2016   Unstable angina ([place]) 06/14/2016   Hx of adenomatous colonic polyps 04/06/2016   GERD without esophagitis 10/29/2015   IBS (irritable bowel syndrome) 10/29/2015   Urinary incontinence 10/29/2015   Osteoarthritis [id number]   Diastolic dysfunction [id number]   Chest pain 11/23/2011   Chronic pain of left knee 01/08/2011   HYPOKALEMIA 07/31/2009   Mixed hyperlipidemia 05/22/2009   Anxiety and depression 05/22/2009   Essential hypertension 05/22/2009   Coronary artery disease involving native coronary artery of native heart 05/22/2009    Conditions to be addressed/monitored: Dementia; Level of care concerns, Limited access to caregiver, Cognitive Deficits, and Memory Deficits  Care Plan : LCSW Plan of Care  Updates made by Deirdre Peer, LCSW since 11/11/2020 12:00 AM     Problem: Mobility, safety and Independence   Priority: High     Long-Range Goal: Mobility and Independence Optimized   Start Date: 10/22/2020  Expected End Date: 12/29/2020  This Visit's Progress: On track  Recent Progress: On track  Priority: High  Note:   Current barriers:   Patient in need of assistance with connecting to community resources for Level of care concerns, ADL IADL limitations, Limited access to caregiver, Cognitive Deficits, Memory Deficits, and Inability to perform ADL's independently Acknowledges deficits with meeting this unmet need Patient is unable to independently navigate community resource options without care coordination support Clinical Goals:  explore community resource options for unmet needs related  to:Inability to perform IADL's independently, Facility placement (in future), and Memory Deficits  patient will work with SW to address concerns related to resources, support and guidance needing for current and long term care planning Clinical Interventions:  11/11/20- CSW spoke with pt's daughter, [name], for updates. She reports "nothing new" and that she is taking pt [facility].  Family has decided against a lift chair and feels her falling has been in the kitchen and her room and not a steps issue. Family continues to ponder long term care options for pt. CSW discussed Medicaid eligibilty/coverage, etc again and they are continuing to determine what they can do.  CSW encouraged daughter to consider outreach to an [place], [place] and to an extended family member of daughter's husband who runs several [employer] in [place].  Daughter will traveling into October and will call me if needs arise prior to a follow up call 10/24   10/22/20 [name] reports pt is living with her sister (pt's daughterDewaine Oats) who works and thus pt is home alone sometimes. Family is concerned about pt's safety being alone and in a home with bedrooms on 2nd floor.  Also, with pt being 89, on  oxygen and her not wearing her life alert button they are concerned and seeking help. CSW discussed possible lift chair and other adaptations that may can be done to help. Daughter reports they do not want to pursue placement at this time but may need to do so in the future. CSW advised daughter of the process for this as well as the levels of care, cost/coverage, Medicaid, etc.  Pt does own a home/property and thus this was discussed as a potential barrier to Medicaid/placement. CSW alerted daughter to guidelines with Medicaid and their "look back"; thus advising her to be cautious and seek legal or DSS guidance before doing anything with pt's assets.   Daughter was appreciative of the suggestions and information  provided and will share with pt and her siblings (2 other daughters and a son). Family will reach out to [place] if further questions or needs arise prior to follow up call planned for 12/23/20 Collaboration with Carlis Abbott, Leticia Penna, NP regarding development and update of comprehensive plan of care as evidenced by provider attestation and co-signature Inter-disciplinary care team collaboration (see longitudinal plan of care) Assessment of needs, barriers , agencies contacted, as well as how impacting  Review various resources, discussed options and provided patient information about Level of care concerns, ADL IADL limitations, Limited access to caregiver, Cognitive Deficits, Inability to perform IADL's independently, and Lacks knowledge of community resources:   Collaborated with appropriate clinical care team members regarding patient needs Level of care concerns, ADL IADL limitations, Limited access to caregiver, and Cognitive Deficits, COPD and Dementia  Patient interviewed and appropriate assessments performed Provided patient with information about levels of care, Medicaid, etc Discussed plans with patient for ongoing care management follow up and provided patient with direct contact information for care management team Provided education to patient/caregiver regarding level of care options. Other interventions provided: Solution-Focused Strategies, Active listening / Reflection utilized , Problem Solving [facility] , Motivational Interviewing, and Caregiver stress acknowledged  Patient Goals:   -consider contact with DSS, Attorney,etc re: Medicaid eligibilty - begin a notebook of services in my neighborhood or community - call 211 when I need some help - follow-up on any referrals for help I am given - think ahead to make sure my need does not become an emergency - have a back-up plan - make a list of family or friends that I can call  -  Follow Up Plan: Appointment scheduled for SW follow  up with client by phone on: 12/23/20       Follow Up Plan: Appointment scheduled for SW follow up with client by phone on: 12/23/20      Eduard Clos MSW, [place] Licensed Clinical Social Worker [place] [phone]

## 2020-11-29 DIAGNOSIS — M159 Polyosteoarthritis, unspecified: Secondary | ICD-10-CM

## 2020-11-29 DIAGNOSIS — J449 Chronic obstructive pulmonary disease, unspecified: Secondary | ICD-10-CM

## 2020-12-01 ENCOUNTER — Other Ambulatory Visit: Payer: Self-pay | Admitting: Primary Care

## 2020-12-01 DIAGNOSIS — M79671 Pain in right foot: Secondary | ICD-10-CM

## 2020-12-01 DIAGNOSIS — M79672 Pain in left foot: Secondary | ICD-10-CM

## 2020-12-01 DIAGNOSIS — M159 Polyosteoarthritis, unspecified: Secondary | ICD-10-CM

## 2020-12-03 ENCOUNTER — Other Ambulatory Visit: Payer: Self-pay

## 2020-12-03 MED ORDER — CLOPIDOGREL BISULFATE 75 MG PO TABS: ORAL_TABLET | ORAL | 3 refills | Status: DC

## 2020-12-09 ENCOUNTER — Ambulatory Visit (INDEPENDENT_AMBULATORY_CARE_PROVIDER_SITE_OTHER): Payer: Medicare Other

## 2020-12-09 DIAGNOSIS — R413 Other amnesia: Secondary | ICD-10-CM

## 2020-12-09 DIAGNOSIS — J449 Chronic obstructive pulmonary disease, unspecified: Secondary | ICD-10-CM

## 2020-12-09 DIAGNOSIS — R296 Repeated falls: Secondary | ICD-10-CM

## 2020-12-09 DIAGNOSIS — I10 Essential (primary) hypertension: Secondary | ICD-10-CM

## 2020-12-09 DIAGNOSIS — I2510 Atherosclerotic heart disease of native coronary artery without angina pectoris: Secondary | ICD-10-CM

## 2020-12-09 NOTE — Chronic Care Management (AMB) (Signed)
Chronic Care Management   CCM RN Visit Note  12/09/2020 Name: Cindy Brandt MRN: [id number] DOB: 1932-02-26  Subjective: Cindy Brandt is a 87 y.o. year old female who is a primary care patient of [name], NP. The care management team was consulted for assistance with disease management and care coordination needs.    Engaged with patient by telephone for follow up visit in response to provider referral for case management and/or care coordination services.   Consent to Services:  The patient was given information about Chronic Care Management services, agreed to services, and gave verbal consent prior to initiation of services.  Please see initial visit note for detailed documentation.   Patient agreed to services and verbal consent obtained.   Assessment: Review of patient past medical history, allergies, medications, health status, including review of consultants reports, laboratory and other test data, was performed as part of comprehensive evaluation and provision of chronic care management services.   SDOH (Social Determinants of Health) assessments and interventions performed:    CCM Care Plan  Allergies  Allergen Reactions   Ranexa [Ranolazine] Other (See Comments)    Does NOT "agree" with the patient- does not feel like herself    Outpatient Encounter Medications as of 12/09/2020  Medication Sig   albuterol (PROVENTIL) (2.5 MG/3ML) 0.083% nebulizer solution Take 3 mLs (2.5 mg total) by nebulization every 6 (six) hours as needed for wheezing or shortness of breath.   amLODipine (NORVASC) 2.5 MG tablet TAKE 1 TABLET BY MOUTH EVERY DAY   aspirin EC 81 MG tablet Take 81 mg by mouth every morning.   atenolol (TENORMIN) 25 MG tablet TAKE 1 TABLET BY MOUTH EVERY DAY   atorvastatin (LIPITOR) 80 MG tablet TAKE 1 TABLET BY MOUTH EVERY DAY IN THE EVENING FOR CHOLESTEROL   calcium carbonate (OS-CAL) 600 MG TABS tablet Take 1,200 mg by mouth daily.   cetirizine (ZYRTEC) 10  MG tablet Take 10 mg by mouth daily.   clopidogrel (PLAVIX) 75 MG tablet NEW PRESCRIPTION REQUEST: TAKE ONE TABLET BY MOUTH DAILY   famotidine (PEPCID) 20 MG tablet Take 20 mg by mouth daily.    fluticasone (FLONASE) 50 MCG/ACT nasal spray PLACE 1 SPRAY INTO BOTH NOSTRILS 2 (TWO) TIMES DAILY   Fluticasone-Umeclidin-Vilant (TRELEGY ELLIPTA) 100-62.5-25 MCG/INH AEPB Inhale 1 puff into the lungs daily.   gabapentin (NEURONTIN) 100 MG capsule Take 1 capsule (100 mg total) by mouth at bedtime as needed. For pain.   isosorbide mononitrate (IMDUR) 120 MG 24 hr tablet Take 1 tablet (120 mg total) by mouth in the morning.   Melatonin 5 MG TABS Take 5 mg by mouth at bedtime as needed (sleep).    nitroGLYCERIN (NITROSTAT) 0.4 MG SL tablet PLACE 1 TABLET UNDER THE TONGUE EVERY 5 MIN AS NEEDED FOR CHEST PAIN   pantoprazole (PROTONIX) 40 MG tablet TAKE 1 TABLET BY MOUTH ONCE DAILY FOR HEARTBURN.   PROAIR HFA 108 (90 Base) MCG/ACT inhaler NEW PRESCRIPTION REQUEST: INHALE ONE PUFF BY MOUTH EVERY FOUR HOURS AS NEEDED   sertraline (ZOLOFT) 100 MG tablet TAKE 1 TABLET BY MOUTH ONCE DAILY FOR DEPRESSION/ANXIETY.   solifenacin (VESICARE) 5 MG tablet TAKE 1 TABLET (5 MG TOTAL) BY MOUTH DAILY. FOR OVERACTIVE BLADDER.   spironolactone (ALDACTONE) 25 MG tablet TAKE 1/2 TABLET BY MOUTH EVERY DAY   No facility-administered encounter medications on file as of 12/09/2020.    Patient Active Problem List   Diagnosis Date Noted   Recurrent falls 11/01/2020  Memory changes 05/10/2020   Chronic obstructive pulmonary disease ([place]) 02/05/2020   Sensation of fullness in left ear 02/05/2020   Pain in both feet 01/04/2020   Chronic diastolic CHF (congestive heart failure) ([place]) 12/23/2019   Left bundle branch block 12/23/2019   Cough 10/27/2019   Pain of right lower extremity 06/26/2019   Dizziness 06/26/2019   Herpes zoster without complication 26/94/8546   Angular cheilitis 05/04/2018   Hemorrhoids 04/11/2018   Lower  abdominal pain 04/11/2018   Osteoporosis 11/15/2017   Prediabetes 11/15/2017   Presbycusis of both ears 11/16/2016   Anemia 06/20/2016   Dyspnea    NSTEMI (non-ST elevated myocardial infarction) ([place]) 06/14/2016   Unstable angina ([place]) 06/14/2016   Hx of adenomatous colonic polyps 04/06/2016   GERD without esophagitis 10/29/2015   IBS (irritable bowel syndrome) 10/29/2015   Urinary incontinence 10/29/2015   Osteoarthritis 27/05/5007   Diastolic dysfunction 38/18/2993   Chest pain 11/23/2011   Chronic pain of left knee 01/08/2011   HYPOKALEMIA 07/31/2009   Mixed hyperlipidemia 05/22/2009   Anxiety and depression 05/22/2009   Essential hypertension 05/22/2009   Coronary artery disease involving native coronary artery of native heart 05/22/2009    Conditions to be addressed/monitored:COPD and falls, memory changes  Care Plan : Fall Risk (Adult)  Updates made by Dannielle Karvonen, RN since 12/09/2020 12:00 AM     Problem: Fall Risk   Priority: High     Long-Range Goal: Absence of Fall and Fall-Related Injury   Start Date: 08/06/2020  Expected End Date: 02/28/2021  Recent Progress: On track  Priority: High  Note:   Current Barriers: Telephone call with patients daughter/ designated party release, [name].  Daughter states Patient had follow up appointment with primary care provider on 11/01/2020.  Reports adjustments were made with patient Gabapentin ( decreased dosage) and Buspar was discontinued.  Daughter states adjustments were made with patients medication to see if it would help with daytime drowsiness.  She states she is not able to tell a difference in patients drowsiness at this time.   Daughter states patient has fallen in the home a few more times over the past several weeks without injury.  She reports primary care provider is aware of patients ongoing falls. Daughter states she was given information from the social worker regarding long term care facilities for patient.   She she states she will look into this further.  Daughter states patient is scheduled for a follow up visit with her cardiologist on tomorrow. Advanced directive information received.   Knowledge Deficits related to fall precautions in patient  Unable to self administer medications as prescribed:   Unable to perform ADLs independently Cognitive Deficits Clinical Goal(s):  patient will demonstrate improved adherence to prescribed treatment plan for decreasing falls as evidenced by patient reporting and review of EMR patient Altamese Dilling will verbalize using fall risk reduction strategies discussed patient will not experience additional falls patient will attend  scheduled medical appointments the patient will demonstrate ongoing self health care management ability as evidenced by adhering to fall precaution strategies and using ambulatory devices as recommended. the patient/ caregiver will work with the care management team towards completion of advanced directives Interventions:  Collaboration with [name], NP regarding development and update of comprehensive plan of care as evidenced by provider attestation and co-signature Inter-disciplinary care team collaboration (see longitudinal plan of care) Provided  verbal education re: Potential causes of falls and Fall prevention strategies Reviewed and discussed medications Assessed for s/s  of orthostatic hypotension Assessed for falls since last encounter.  Assessed patients caregivers knowledge of fall risk prevention. Assessed working status of life alert bracelet/ medical alert and patient adherence:   Reviewed scheduled/upcoming provider appointments  Discussed plans with patient for ongoing care management follow up and provided patient with direct contact information for care management team Self-Care Deficits:  Unable to self administer medications as prescribed Unable to perform ADLs independently Patient Goals:  - Continue to  utilize walker and / or wheelchair appropriately with all ambulation (continue to follow fall precautions as advised.) -  Always keep walkways de-cluttered.  - Change positions slowly (from laying to sitting, from sitting to standing to help with decreasing dizziness).  Monitor blood pressure at least 3 times per week.  -  Always wear secure fitting shoes at all times with ambulation -  Always utilize home lighting for dim lit areas - Keep items commonly used within reach  - Use medical alert  Follow Up Plan: The patient has been provided with contact information for the care management team and has been advised to call with any health related questions or concerns.  The care management team will reach out to the patient again over the next 1-2 months        Care Plan : COPD (Adult)  Updates made by Dannielle Karvonen, RN since 12/09/2020 12:00 AM     Problem: Symptom Exacerbation (COPD)   Priority: Medium     Long-Range Goal: Symptom Exacerbation Prevented or Minimized   Start Date: 08/06/2020  Expected End Date: 02/28/2021  This Visit's Progress: On track  Recent Progress: On track  Priority: High  Note:   Current Barriers: Telephone call with patients daughter/ designated party release, [name].  Daughter states patient had follow up visit with her primary care provider on 11/01/2020.  Daughter reports patient is being more compliant with wearing her oxygen around the clock. She denies patient voicing any increase COPD symptoms and reports patients O2 sats continue to range from 94-96%.  Knowledge deficits related to basic COPD self care/management Cognitive Deficits Unable to self administer medications as prescribed Unable to perform ADLs independently Unable to perform IADLs independently  Case Manager Clinical Goal(s): Patient/ caregiver will verbalize understanding of COPD action plan and when to seek appropriate levels of medical care Patient / caregiver will verbalize basic  understanding of COPD disease process and self care activities  Interventions:  Collaboration with [name], NP regarding development and update of comprehensive plan of care as evidenced by provider attestation and co-signature Inter-disciplinary care team collaboration (see longitudinal plan of care) Provided patient/ caregiver with verbal COPD education on self care/management/and exacerbation prevention  Provided patient/caregiver with COPD action plan and reinforced importance of daily self assessment .   Advised patient / caregiver to self assesses COPD action plan zone and make appointment with provider if in the yellow zone for 48 hours without improvement. Provided education about and advised patient to utilize infection prevention strategies to reduce risk of respiratory infection  Reviewed medications with caregiver Patient Goals: - Continue to take your medications as prescribed and refill timely  - eliminate symptom triggers at home that will cause COPD to flare up - Call your doctor for new or ongoing mild to moderate symptoms. Call 911 for severe, life threatening symptoms, call your provider for mild/ moderate COPD symptoms.  - Continue to wear your oxygen around the clock as advised by your provider.  -  Continue to  keep follow-up appointments with your providers - use an extra pillow to sleep if needed - listen for public air quality announcements every day Follow Up Plan: The patient has been provided with contact information for the care management team and has been advised to call with any health related questions or concerns.  The care management team will reach out to the patient again over the next 1-2 months          Plan:The patient has been provided with contact information for the care management team and has been advised to call with any health related questions or concerns.  and The care management team will reach out to the patient again over the next  1-2 months  Quinn Plowman RN,BSN,CCM RN Case Manager [facility]  [phone]

## 2020-12-09 NOTE — Patient Instructions (Signed)
Visit Information:  Thank you for taking the time to speak with me today.   PATIENT GOALS:  Goals Addressed             This Visit's Progress    Prevent Falls and Injury   Not on track    Timeframe:  Long-Range Goal Priority:  High Start Date:    08/06/2020                         Expected End Date:  02/28/2021                Follow Up Date 02/11/2021   - Continue to utilize walker and / or wheelchair appropriately with all ambulation (continue to follow fall precautions as advised.) -  Always keep walkways de-cluttered.  - Change positions slowly (from laying to sitting, from sitting to standing to help with decreasing dizziness).  Monitor blood pressure at least 3 times per week.  -  Always wear secure fitting shoes at all times with ambulation -  Always utilize home lighting for dim lit areas - Keep items commonly used within reach  - Use medical alert    Why is this important?   Most falls happen when it is hard for you to walk safely. Your balance may be off because of an illness. You may have pain in your knees, hip or other joints.  You may be overly tired or taking medicines that make you sleepy. You may not be able to see or hear clearly.  Falls can lead to broken bones, bruises or other injuries.  There are things you can do to help prevent falling.          Track and Manage My Symptoms-COPD   On track    Timeframe:  Long-Range Goal Priority:  High Start Date:  08/06/2020                           Expected End Date:   02/28/2021                 Follow Up Date 02/11/2021 - Continue to take your medications as prescribed and refill timely  - eliminate symptom triggers at home that will cause COPD to flare up - Call your doctor for new or ongoing mild to moderate symptoms. Call 911 for severe, life threatening symptoms, call your provider for mild/ moderate COPD symptoms.  - Continue to wear your oxygen around the clock as advised by your provider.  -  Continue to  keep follow-up appointments with your providers - use an extra pillow to sleep if needed - listen for public air quality announcements every day    Why is this important?   Tracking your symptoms and other information about your health helps your doctor plan your care.  Write down the symptoms, the time of day, what you were doing and what medicine you are taking.  You will soon learn how to manage your symptoms.             Patient verbalizes understanding of instructions provided today and agrees to view in [place].   The patient has been provided with contact information for the care management team and has been advised to call with any health related questions or concerns.  The care management team will reach out to the patient again over the next 1-2 months    Quinn Plowman RN,BSN,CCM RN  Case Manager [place]  [phone]

## 2020-12-10 ENCOUNTER — Other Ambulatory Visit: Payer: Self-pay

## 2020-12-10 ENCOUNTER — Ambulatory Visit: Payer: Medicare Other | Admitting: Cardiovascular Disease

## 2020-12-10 ENCOUNTER — Encounter: Payer: Self-pay | Admitting: Cardiovascular Disease

## 2020-12-10 DIAGNOSIS — I25119 Atherosclerotic heart disease of native coronary artery with unspecified angina pectoris: Secondary | ICD-10-CM | POA: Diagnosis not present

## 2020-12-10 DIAGNOSIS — E782 Mixed hyperlipidemia: Secondary | ICD-10-CM

## 2020-12-10 DIAGNOSIS — I10 Essential (primary) hypertension: Secondary | ICD-10-CM | POA: Diagnosis not present

## 2020-12-10 DIAGNOSIS — M79671 Pain in right foot: Secondary | ICD-10-CM | POA: Diagnosis not present

## 2020-12-10 DIAGNOSIS — M79672 Pain in left foot: Secondary | ICD-10-CM | POA: Diagnosis not present

## 2020-12-10 DIAGNOSIS — I5189 Other ill-defined heart diseases: Secondary | ICD-10-CM | POA: Diagnosis not present

## 2020-12-10 MED ORDER — AMLODIPINE BESYLATE 5 MG PO TABS: 5.0000 mg | ORAL_TABLET | Freq: Every day | ORAL | 1 refills | Status: DC

## 2020-12-10 NOTE — Assessment & Plan Note (Signed)
History of chronic diastolic dysfunction on spironolactone.

## 2020-12-10 NOTE — Assessment & Plan Note (Signed)
History of hyperlipidemia on statin therapy with lipid profile performed 04/22/2020 revealing total cholesterol 122, LDL 56 and HDL 46.

## 2020-12-10 NOTE — Assessment & Plan Note (Signed)
Patient has question PAD.  For the most part she lives lives a sedentary lifestyle.  Her ABIs were unobtainable.  She did have monophasic waveforms in her iliac arteries bilaterally suggesting a more proximal lesion.  Given her age and comorbidities I do not think she is a candidate for invasive peripheral vascular evaluation.

## 2020-12-10 NOTE — Patient Instructions (Addendum)
Medication Instructions:   -Increase amlodipine (norvasc) 5mg  daily.  *If you need a refill on your cardiac medications before your next appointment, please call your pharmacy*   Follow-Up: At [facility], you and your health needs are our priority.  As part of our continuing mission to provide you with exceptional heart care, we have created designated Provider Care Teams.  These Care Teams include your primary Cardiologist (physician) and Advanced Practice Providers (APPs -  Physician Assistants and Nurse Practitioners) who all work together to provide you with the care you need, when you need it.  We recommend signing up for the patient portal called "MyChart".  Sign up information is provided on this After Visit Summary.  MyChart is used to connect with patients for Virtual Visits (Telemedicine).  Patients are able to view lab/test results, encounter notes, upcoming appointments, etc.  Non-urgent messages can be sent to your provider as well.   To learn more about what you can do with MyChart, go to [email or web address].    Your next appointment:   6 month(s)  The format for your next appointment:   In Person  Provider:   You will see one of the following Advanced Practice Providers on your designated Care Team:   Coletta Memos, FNP  Then, Quay Burow, MD will plan to see you again in 12 month(s).

## 2020-12-10 NOTE — Assessment & Plan Note (Signed)
History of essential hypertension blood pressure measured today 160/86.  Her daughter states today that she just took her blood pressure medicines when getting in the car and coming over to the office for this visit.  She is on low-dose amlodipine, and atenolol.

## 2020-12-10 NOTE — Progress Notes (Signed)
12/10/2020 Cindy Brandt   Sep 05, 1931  [id number]  Primary Physician [name], NP Primary Cardiologist: Lorretta Harp MD [name], [place]  HPI:  Cindy Brandt is a 87 y.o.  widowed African-American female mother of 2 children he was accompanied by her daughters Cindy Brandt  and Cindy Brandt  I last saw her in the office   03/13/2020..   she has a h/o CAD (CTO of RCA & nonobs LAD/LCX dzs - 2005), HTN, HL, Diast Dysfxn, hypokalemia, and gastritis (EGD 04/2016), who presented on transfer from [facility] for admission after presentation w/ nitrate responsive chest pain and mild troponin elevation. I performed cardiac catheterization on her 06/15/16 revealed an occluded RCA with left right collaterals, occluded nondominant circumflex, high-grade ostial ramus branch stenosis and 50% proximal LAD stenosis. She had normal LV function with grade 2 diastolic dysfunction. She is on optimal medical therapy. She does suffer from anxiety. She has nocturnal chest pain especially when she is recumbent with which sounds more like GERD. She is on an H2 antagonist.   She was admitted to the hospital with Multi lobar pneumonia and was discharged on home oxygen.  She is pretty much housebound.  She complains of some shortness of breath as well as chest pain which she takes up an electric lotion for in a weekly basis.  Since I saw her 9 months ago she is remained stable.  She still is fairly sedentary.  She is on oxygen 24/7 since her multilobar pneumonia last year.  She does take sublingual nitroglycerin on a weekly basis.  She saw Coletta Memos, FNP because of lower extremity numbness which showed monophasic waveforms in her iliac artery suggesting a more proximal lesion although I do not think she is a candidate for invasive evaluation.  Current Meds  Medication Sig   albuterol (PROVENTIL) (2.5 MG/3ML) 0.083% nebulizer solution Take 3 mLs (2.5 mg total) by nebulization every 6 (six) hours as needed for wheezing or  shortness of breath.   aspirin EC 81 MG tablet Take 81 mg by mouth every morning.   atenolol (TENORMIN) 25 MG tablet TAKE 1 TABLET BY MOUTH EVERY DAY   atorvastatin (LIPITOR) 80 MG tablet TAKE 1 TABLET BY MOUTH EVERY DAY IN THE EVENING FOR CHOLESTEROL   busPIRone (BUSPAR) 7.5 MG tablet Take 7.5 mg by mouth 2 (two) times daily.   calcium carbonate (OS-CAL) 600 MG TABS tablet Take 1,200 mg by mouth daily.   cetirizine (ZYRTEC) 10 MG tablet Take 10 mg by mouth daily.   clopidogrel (PLAVIX) 75 MG tablet NEW PRESCRIPTION REQUEST: TAKE ONE TABLET BY MOUTH DAILY   famotidine (PEPCID) 20 MG tablet Take 20 mg by mouth daily.    fluticasone (FLONASE) 50 MCG/ACT nasal spray PLACE 1 SPRAY INTO BOTH NOSTRILS 2 (TWO) TIMES DAILY   Fluticasone-Umeclidin-Vilant (TRELEGY ELLIPTA) 100-62.5-25 MCG/INH AEPB Inhale 1 puff into the lungs daily.   gabapentin (NEURONTIN) 100 MG capsule Take 1 capsule (100 mg total) by mouth at bedtime as needed. For pain.   isosorbide mononitrate (IMDUR) 120 MG 24 hr tablet Take 1 tablet (120 mg total) by mouth in the morning.   Melatonin 5 MG TABS Take 5 mg by mouth at bedtime as needed (sleep).    nitroGLYCERIN (NITROSTAT) 0.4 MG SL tablet PLACE 1 TABLET UNDER THE TONGUE EVERY 5 MIN AS NEEDED FOR CHEST PAIN   pantoprazole (PROTONIX) 40 MG tablet TAKE 1 TABLET BY MOUTH ONCE DAILY FOR HEARTBURN.   PROAIR HFA 108 (  90 Base) MCG/ACT inhaler NEW PRESCRIPTION REQUEST: INHALE ONE PUFF BY MOUTH EVERY FOUR HOURS AS NEEDED   sertraline (ZOLOFT) 100 MG tablet TAKE 1 TABLET BY MOUTH ONCE DAILY FOR DEPRESSION/ANXIETY.   solifenacin (VESICARE) 5 MG tablet TAKE 1 TABLET (5 MG TOTAL) BY MOUTH DAILY. FOR OVERACTIVE BLADDER.   spironolactone (ALDACTONE) 25 MG tablet TAKE 1/2 TABLET BY MOUTH EVERY DAY   [DISCONTINUED] amLODipine (NORVASC) 2.5 MG tablet TAKE 1 TABLET BY MOUTH EVERY DAY     Allergies  Allergen Reactions   Ranexa [Ranolazine] Other (See Comments)    Does NOT "agree" with the patient-  does not feel like herself    Social History   Socioeconomic History   Marital status: Widowed    Spouse name: Not on file   Number of children: 5   Years of education: Not on file   Highest education level: Not on file  Occupational History   Occupation: Retired  Tobacco Use   Smoking status: Former    Packs/day: 0.50    Years: 25.00    Pack years: 12.50    Types: Cigarettes    Quit date: 04/06/1996    Years since quitting: 24.6   Smokeless tobacco: Never  Vaping Use   Vaping Use: Never used  Substance and Sexual Activity   Alcohol use: No   Drug use: No   Sexual activity: Not on file  Other Topics Concern   Not on file  Social History Narrative   Currently living with dtr in [place].  No regular exercise.   Social Determinants of Health   Financial Resource Strain: Low Risk    Difficulty of Paying Living Expenses: Not hard at all  Food Insecurity: No Food Insecurity   Worried About Charity fundraiser in the Last Year: Never true   [place] in the Last Year: Never true  Transportation Needs: No Transportation Needs   Lack of Transportation (Medical): No   Lack of Transportation (Non-Medical): No  Physical Activity: Not on file  Stress: Not on file  Social Connections: Not on file  Intimate Partner Violence: Not on file     Review of Systems: General: negative for chills, fever, night sweats or weight changes.  Cardiovascular: negative for chest pain, dyspnea on exertion, edema, orthopnea, palpitations, paroxysmal nocturnal dyspnea or shortness of breath Dermatological: negative for rash Respiratory: negative for cough or wheezing Urologic: negative for hematuria Abdominal: negative for nausea, vomiting, diarrhea, bright red blood per rectum, melena, or hematemesis Neurologic: negative for visual changes, syncope, or dizziness All other systems reviewed and are otherwise negative except as noted above.    Blood pressure (!) 160/86, pulse 68, height 5'  (1.524 m), weight 141 lb 12.8 oz (64.3 kg), SpO2 99 %.  General appearance: alert and no distress Neck: no adenopathy, no carotid bruit, no JVD, supple, symmetrical, trachea midline, and thyroid not enlarged, symmetric, no tenderness/mass/nodules Lungs: clear to auscultation bilaterally Heart: regular rate and rhythm, S1, S2 normal, no murmur, click, rub or gallop Extremities: extremities normal, atraumatic, no cyanosis or edema Pulses: 2+ and symmetric Skin: Skin color, texture, turgor normal. No rashes or lesions Neurologic: Grossly normal  EKG not performed today  ASSESSMENT AND PLAN:   Mixed hyperlipidemia History of hyperlipidemia on statin therapy with lipid profile performed 04/22/2020 revealing total cholesterol 122, LDL 56 and HDL 46.  Essential hypertension History of essential hypertension blood pressure measured today 160/86.  Her daughter states today that she just took her blood  pressure medicines when getting in the car and coming over to the office for this visit.  She is on low-dose amlodipine, and atenolol.  Coronary artery disease involving native coronary artery of native heart Patient history of CAD status post cardiac catheterization performed by myself 08/02/2017 revealing a chronically occluded dominant RCA with left-to-right collaterals, a chronically occluded nondominant circumflex at the origin and 50% segmental mid LAD disease.  I elected to treat her medically.  She is on high-dose long-acting nitrates, low-dose amlodipine.  She was intolerant to ranolazine.  She does take sublingual nitroglycerin on a weekly basis.  At this point, after discussion with the patient and her daughters I do not think she is a candidate for coronary intervention or recatheterization.  Diastolic dysfunction History of chronic diastolic dysfunction on spironolactone.  Pain in both feet Patient has question PAD.  For the most part she lives lives a sedentary lifestyle.  Her ABIs were  unobtainable.  She did have monophasic waveforms in her iliac arteries bilaterally suggesting a more proximal lesion.  Given her age and comorbidities I do not think she is a candidate for invasive peripheral vascular evaluation.     Lorretta Harp MD FACP,FACC,FAHA, [facility] 12/10/2020 11:51 AM

## 2020-12-10 NOTE — Assessment & Plan Note (Signed)
Patient history of CAD status post cardiac catheterization performed by myself 08/02/2017 revealing a chronically occluded dominant RCA with left-to-right collaterals, a chronically occluded nondominant circumflex at the origin and 50% segmental mid LAD disease.  I elected to treat her medically.  She is on high-dose long-acting nitrates, low-dose amlodipine.  She was intolerant to ranolazine.  She does take sublingual nitroglycerin on a weekly basis.  At this point, after discussion with the patient and her daughters I do not think she is a candidate for coronary intervention or recatheterization.

## 2020-12-21 ENCOUNTER — Other Ambulatory Visit: Payer: Self-pay | Admitting: Primary Care

## 2020-12-21 ENCOUNTER — Other Ambulatory Visit: Payer: Self-pay | Admitting: Cardiovascular Disease

## 2020-12-21 DIAGNOSIS — I25118 Atherosclerotic heart disease of native coronary artery with other forms of angina pectoris: Secondary | ICD-10-CM

## 2020-12-21 DIAGNOSIS — F411 Generalized anxiety disorder: Secondary | ICD-10-CM

## 2020-12-23 ENCOUNTER — Ambulatory Visit: Payer: Medicare Other | Admitting: *Deleted

## 2020-12-23 DIAGNOSIS — J449 Chronic obstructive pulmonary disease, unspecified: Secondary | ICD-10-CM

## 2020-12-23 DIAGNOSIS — M159 Polyosteoarthritis, unspecified: Secondary | ICD-10-CM

## 2020-12-23 DIAGNOSIS — R413 Other amnesia: Secondary | ICD-10-CM

## 2020-12-23 DIAGNOSIS — R296 Repeated falls: Secondary | ICD-10-CM

## 2020-12-23 NOTE — Patient Instructions (Signed)
Visit Information  PATIENT GOALS:  Goals Addressed               This Visit's Progress     COMPLETED: Find Help in My Community (pt-stated)        Timeframe:  Long-Range Goal Priority:  High Start Date:    10/22/20                         Expected End Date:            12/29/20           Follow Up Date 10/24//22    -consider contact with DSS, Attorney,etc re: Medicaid eligibilty - begin a notebook of services in my neighborhood or community - call 211 when I need some help - follow-up on any referrals for help I am given - think ahead to make sure my need does not become an emergency - have a back-up plan   Why is this important?   Knowing how and where to find help for yourself or family in your neighborhood and community is an important skill.  You will want to take some steps to learn how.    Notes:         The patient verbalized understanding of instructions, educational materials, and care plan provided today and declined offer to receive copy of patient instructions, educational materials, and care plan.   No further follow up required:    Eduard Clos MSW, LCSW Licensed Clinical Social Worker [place] [phone]

## 2020-12-23 NOTE — Chronic Care Management (AMB) (Signed)
Chronic Care Management    Clinical Social Work Note  12/23/2020 Name: Cindy Brandt MRN: [id number] DOB: [birth year]  Cindy Brandt is a 87 y.o. year old female who is a primary care patient of [name], NP. The CCM team was consulted to assist the patient with chronic disease management and/or care coordination needs related to: Level of Care Concerns and Caregiver Stress.   Engaged with patient by telephone for follow up visit in response to provider referral for social work chronic care management and care coordination services.   Consent to Services:  The patient was given information about Chronic Care Management services, agreed to services, and gave verbal consent prior to initiation of services.  Please see initial visit note for detailed documentation.   Patient agreed to services and consent obtained.   Assessment: Review of patient past medical history, allergies, medications, and health status, including review of relevant consultants reports was performed today as part of a comprehensive evaluation and provision of chronic care management and care coordination services.     SDOH (Social Determinants of Health) assessments and interventions performed:    Advanced Directives Status: Not addressed in this encounter.  CCM Care Plan  Allergies  Allergen Reactions   Ranexa [Ranolazine] Other (See Comments)    Does NOT "agree" with the patient- does not feel like herself    Outpatient Encounter Medications as of 12/23/2020  Medication Sig   albuterol (PROVENTIL) (2.5 MG/3ML) 0.083% nebulizer solution Take 3 mLs (2.5 mg total) by nebulization every 6 (six) hours as needed for wheezing or shortness of breath.   amLODipine (NORVASC) 5 MG tablet Take 1 tablet (5 mg total) by mouth daily.   aspirin EC 81 MG tablet Take 81 mg by mouth every morning.   atenolol (TENORMIN) 25 MG tablet TAKE 1 TABLET BY MOUTH EVERY DAY   atorvastatin (LIPITOR) 80 MG tablet TAKE 1 TABLET BY  MOUTH EVERY DAY IN THE EVENING FOR CHOLESTEROL   calcium carbonate (OS-CAL) 600 MG TABS tablet Take 1,200 mg by mouth daily.   cetirizine (ZYRTEC) 10 MG tablet Take 10 mg by mouth daily.   clopidogrel (PLAVIX) 75 MG tablet NEW PRESCRIPTION REQUEST: TAKE ONE TABLET BY MOUTH DAILY   famotidine (PEPCID) 20 MG tablet Take 20 mg by mouth daily.    fluticasone (FLONASE) 50 MCG/ACT nasal spray PLACE 1 SPRAY INTO BOTH NOSTRILS 2 (TWO) TIMES DAILY   Fluticasone-Umeclidin-Vilant (TRELEGY ELLIPTA) 100-62.5-25 MCG/INH AEPB Inhale 1 puff into the lungs daily.   gabapentin (NEURONTIN) 100 MG capsule Take 1 capsule (100 mg total) by mouth at bedtime as needed. For pain.   isosorbide mononitrate (IMDUR) 120 MG 24 hr tablet Take 1 tablet (120 mg total) by mouth in the morning.   Melatonin 5 MG TABS Take 5 mg by mouth at bedtime as needed (sleep).    nitroGLYCERIN (NITROSTAT) 0.4 MG SL tablet PLACE 1 TABLET UNDER THE TONGUE EVERY 5 MIN AS NEEDED FOR CHEST PAIN   pantoprazole (PROTONIX) 40 MG tablet TAKE 1 TABLET BY MOUTH ONCE DAILY FOR HEARTBURN.   PROAIR HFA 108 (90 Base) MCG/ACT inhaler NEW PRESCRIPTION REQUEST: INHALE ONE PUFF BY MOUTH EVERY FOUR HOURS AS NEEDED   sertraline (ZOLOFT) 100 MG tablet TAKE 1 TABLET BY MOUTH ONCE DAILY FOR DEPRESSION/ANXIETY.   solifenacin (VESICARE) 5 MG tablet TAKE 1 TABLET (5 MG TOTAL) BY MOUTH DAILY. FOR OVERACTIVE BLADDER.   spironolactone (ALDACTONE) 25 MG tablet TAKE 1/2 TABLET BY MOUTH EVERY DAY   No  facility-administered encounter medications on file as of 12/23/2020.    Patient Active Problem List   Diagnosis Date Noted   Recurrent falls 11/01/2020   Memory changes 05/10/2020   Chronic obstructive pulmonary disease ([place]) 02/05/2020   Sensation of fullness in left ear 02/05/2020   Pain in both feet 01/04/2020   Chronic diastolic CHF (congestive heart failure) ([place]) 12/23/2019   Left bundle branch block 12/23/2019   Cough 10/27/2019   Pain of right lower extremity  06/26/2019   Dizziness 06/26/2019   Herpes zoster without complication 48/25/0037   Angular cheilitis 05/04/2018   Hemorrhoids 04/11/2018   Lower abdominal pain 04/11/2018   Osteoporosis 11/15/2017   Prediabetes 11/15/2017   Presbycusis of both ears 11/16/2016   Anemia 06/20/2016   Dyspnea    NSTEMI (non-ST elevated myocardial infarction) ([place]) 06/14/2016   Unstable angina ([place]) 06/14/2016   Hx of adenomatous colonic polyps 04/06/2016   GERD without esophagitis 10/29/2015   IBS (irritable bowel syndrome) 10/29/2015   Urinary incontinence 10/29/2015   Osteoarthritis 04/88/8916   Diastolic dysfunction 94/50/3888   Chest pain 11/23/2011   Chronic pain of left knee 01/08/2011   HYPOKALEMIA 07/31/2009   Mixed hyperlipidemia 05/22/2009   Anxiety and depression 05/22/2009   Essential hypertension 05/22/2009   Coronary artery disease involving native coronary artery of native heart 05/22/2009    Conditions to be addressed/monitored:  level of care needs ; Level of care concerns, Limited access to caregiver, and Lacks knowledge of community resource:    Care Plan : LCSW Plan of Care  Updates made by Deirdre Peer, LCSW since 12/23/2020 12:00 AM     Problem: Mobility, safety and Independence   Priority: High     Long-Range Goal: Mobility and Independence Optimized Completed 12/23/2020  Start Date: 10/22/2020  Expected End Date: 12/29/2020  This Visit's Progress: On track  Recent Progress: On track  Priority: High  Note:   Current barriers:   Patient in need of assistance with connecting to community resources for Level of care concerns, ADL IADL limitations, Limited access to caregiver, Cognitive Deficits, Memory Deficits, and Inability to perform ADL's independently Acknowledges deficits with meeting this unmet need Patient is unable to independently navigate community resource options without care coordination support Clinical Goals:  explore community resource options  for unmet needs related to:Inability to perform IADL's independently, Facility placement (in future), and Memory Deficits  patient will work with SW to address concerns related to resources, support and guidance needing for current and long term care planning Clinical Interventions:   12/23/20: CSW spoke with pt's daughter, [name], for updates. She reports pt has some "blockages" that cause her to not feel her feet and thus worry about a fall. Daughter reports family has decided to not move pt into an ALF at this time. "We will consider long term placement in a nursing home if there comes a time when she has a fall and is in hospital".  CSW discussed plans to sign off at this time- daughter has contact # and will reach out if needs arise.  Daughter appreciative of CSW guidance and support.     11/11/20- CSW spoke with pt's daughter, [name], for updates. She reports "nothing new" and that she is taking [facility].  Family has decided against a lift chair and feels her falling has been in the kitchen and her room and not a steps issue. Family continues to ponder long term care options for pt. CSW discussed Medicaid  eligibilty/coverage, etc again and they are continuing to determine what they can do.  CSW encouraged daughter to consider outreach to an [place], [place] and to an extended family member of daughter's husband who runs several [employer] in [place].  Daughter will traveling into October and will call me if needs arise prior to a follow up call 10/24   10/22/20 [name] reports pt is living with her sister (pt's daughterDewaine Oats) who works and thus pt is home alone sometimes. Family is concerned about pt's safety being alone and in a home with bedrooms on 2nd floor.  Also, with pt being 89, on oxygen and her not wearing her life alert button they are concerned and seeking help. CSW discussed possible lift chair and other adaptations that may can be done to help.  Daughter reports they do not want to pursue placement at this time but may need to do so in the future. CSW advised daughter of the process for this as well as the levels of care, cost/coverage, Medicaid, etc.  Pt does own a home/property and thus this was discussed as a potential barrier to Medicaid/placement. CSW alerted daughter to guidelines with Medicaid and their "look back"; thus advising her to be cautious and seek legal or DSS guidance before doing anything with pt's assets.   Daughter was appreciative of the suggestions and information provided and will share with pt and her siblings (2 other daughters and a son). Family will reach out to [facility] if further questions or needs arise prior to follow up call planned for 12/23/20 Collaboration with Carlis Abbott, Leticia Penna, NP regarding development and update of comprehensive plan of care as evidenced by provider attestation and co-signature Inter-disciplinary care team collaboration (see longitudinal plan of care) Assessment of needs, barriers , agencies contacted, as well as how impacting  Review various resources, discussed options and provided patient information about Level of care concerns, ADL IADL limitations, Limited access to caregiver, Cognitive Deficits, Inability to perform IADL's independently, and Lacks knowledge of community resources:   Collaborated with appropriate clinical care team members regarding patient needs Level of care concerns, ADL IADL limitations, Limited access to caregiver, and Cognitive Deficits, COPD and Dementia  Patient interviewed and appropriate assessments performed Provided patient with information about levels of care, Medicaid, etc Discussed plans with patient for ongoing care management follow up and provided patient with direct contact information for care management team Provided education to patient/caregiver regarding level of care options. Other interventions provided: Solution-Focused Strategies, Active  listening / Reflection utilized , Problem Solving /Task Center , Motivational Interviewing, and Caregiver stress acknowledged  Patient Goals:   -consider contact with DSS, Attorney,etc re: Medicaid eligibilty - begin a notebook of services in my neighborhood or community - call 211 when I need some help - follow-up on any referrals for help I am given - think ahead to make sure my need does not become an emergency - have a back-up plan - make a list of family or friends that I can call  -  Follow Up Plan: Client will call CSW if needs arise        Follow Up Plan:    N/A     Eduard Clos MSW, LCSW Licensed Clinical Social Worker [address] [phone]

## 2020-12-30 DIAGNOSIS — M159 Polyosteoarthritis, unspecified: Secondary | ICD-10-CM

## 2020-12-30 DIAGNOSIS — I10 Essential (primary) hypertension: Secondary | ICD-10-CM

## 2020-12-30 DIAGNOSIS — J449 Chronic obstructive pulmonary disease, unspecified: Secondary | ICD-10-CM | POA: Diagnosis not present

## 2020-12-30 DIAGNOSIS — I2510 Atherosclerotic heart disease of native coronary artery without angina pectoris: Secondary | ICD-10-CM

## 2021-01-14 ENCOUNTER — Ambulatory Visit (INDEPENDENT_AMBULATORY_CARE_PROVIDER_SITE_OTHER): Payer: Medicare Other

## 2021-01-14 DIAGNOSIS — R413 Other amnesia: Secondary | ICD-10-CM

## 2021-01-14 DIAGNOSIS — R296 Repeated falls: Secondary | ICD-10-CM

## 2021-01-14 DIAGNOSIS — I2510 Atherosclerotic heart disease of native coronary artery without angina pectoris: Secondary | ICD-10-CM

## 2021-01-14 DIAGNOSIS — J449 Chronic obstructive pulmonary disease, unspecified: Secondary | ICD-10-CM

## 2021-01-14 NOTE — Patient Instructions (Signed)
Visit Information:  Thank you for taking the time to speak with me today.   Patient Goals/Self-Care Activities: Patient will attend all scheduled provider appointments Patient will call pharmacy for medication refills Patient will call provider office for new concerns or questions Patient will take medications as prescribed Continue to utilize walker appropriately with all ambulation ( continue to follow fall precautions) Keep walkways de-cluttered Eliminate symptom triggers at home that will cause COPD to flare up Continue to wear oxygen as advised by your doctor Keep regular routines with patient and write down important pieces of information. ( Memory loss strategies)  Patient verbalizes understanding of instructions provided today and agrees to view in [facility].   The patient has been provided with contact information for the care management team and has been advised to call with any health related questions or concerns.  The care management team will reach out to the patient on April 04, 2020 at 9:30 am.    Quinn Plowman RN,BSN,CCM RN Case Manager [facility]  [phone]

## 2021-01-14 NOTE — Chronic Care Management (AMB) (Signed)
Chronic Care Management   CCM RN Visit Note  01/14/2021 Name: Cindy Brandt MRN: [id number] DOB: August 04, 1931  Subjective: Cindy Brandt is a 87 y.o. year old female who is a primary care patient of [name], NP. The care management team was consulted for assistance with disease management and care coordination needs.    Engaged with patient by telephone for follow up visit in response to provider referral for case management and/or care coordination services.   Consent to Services:  The patient was given information about Chronic Care Management services, agreed to services, and gave verbal consent prior to initiation of services.  Please see initial visit note for detailed documentation.   Patient agreed to services and verbal consent obtained.   Assessment: Review of patient past medical history, allergies, medications, health status, including review of consultants reports, laboratory and other test data, was performed as part of comprehensive evaluation and provision of chronic care management services.   SDOH (Social Determinants of Health) assessments and interventions performed:    CCM Care Plan  Allergies  Allergen Reactions   Ranexa [Ranolazine] Other (See Comments)    Does NOT "agree" with the patient- does not feel like herself    Outpatient Encounter Medications as of 01/14/2021  Medication Sig   albuterol (PROVENTIL) (2.5 MG/3ML) 0.083% nebulizer solution Take 3 mLs (2.5 mg total) by nebulization every 6 (six) hours as needed for wheezing or shortness of breath.   amLODipine (NORVASC) 5 MG tablet Take 1 tablet (5 mg total) by mouth daily.   aspirin EC 81 MG tablet Take 81 mg by mouth every morning.   atenolol (TENORMIN) 25 MG tablet TAKE 1 TABLET BY MOUTH EVERY DAY   atorvastatin (LIPITOR) 80 MG tablet TAKE 1 TABLET BY MOUTH EVERY DAY IN THE EVENING FOR CHOLESTEROL   calcium carbonate (OS-CAL) 600 MG TABS tablet Take 1,200 mg by mouth daily.   cetirizine  (ZYRTEC) 10 MG tablet Take 10 mg by mouth daily.   clopidogrel (PLAVIX) 75 MG tablet NEW PRESCRIPTION REQUEST: TAKE ONE TABLET BY MOUTH DAILY   famotidine (PEPCID) 20 MG tablet Take 20 mg by mouth daily.    fluticasone (FLONASE) 50 MCG/ACT nasal spray PLACE 1 SPRAY INTO BOTH NOSTRILS 2 (TWO) TIMES DAILY   Fluticasone-Umeclidin-Vilant (TRELEGY ELLIPTA) 100-62.5-25 MCG/INH AEPB Inhale 1 puff into the lungs daily.   gabapentin (NEURONTIN) 100 MG capsule Take 1 capsule (100 mg total) by mouth at bedtime as needed. For pain.   isosorbide mononitrate (IMDUR) 120 MG 24 hr tablet Take 1 tablet (120 mg total) by mouth in the morning.   Melatonin 5 MG TABS Take 5 mg by mouth at bedtime as needed (sleep).    nitroGLYCERIN (NITROSTAT) 0.4 MG SL tablet PLACE 1 TABLET UNDER THE TONGUE EVERY 5 MIN AS NEEDED FOR CHEST PAIN   pantoprazole (PROTONIX) 40 MG tablet TAKE 1 TABLET BY MOUTH ONCE DAILY FOR HEARTBURN.   PROAIR HFA 108 (90 Base) MCG/ACT inhaler NEW PRESCRIPTION REQUEST: INHALE ONE PUFF BY MOUTH EVERY FOUR HOURS AS NEEDED   sertraline (ZOLOFT) 100 MG tablet TAKE 1 TABLET BY MOUTH ONCE DAILY FOR DEPRESSION/ANXIETY.   solifenacin (VESICARE) 5 MG tablet TAKE 1 TABLET (5 MG TOTAL) BY MOUTH DAILY. FOR OVERACTIVE BLADDER.   spironolactone (ALDACTONE) 25 MG tablet TAKE 1/2 TABLET BY MOUTH EVERY DAY   No facility-administered encounter medications on file as of 01/14/2021.    Patient Active Problem List   Diagnosis Date Noted   Recurrent falls 11/01/2020  Memory changes 05/10/2020   Chronic obstructive pulmonary disease ([place]) 02/05/2020   Sensation of fullness in left ear 02/05/2020   Pain in both feet 01/04/2020   Chronic diastolic CHF (congestive heart failure) ([place]) 12/23/2019   Left bundle branch block 12/23/2019   Cough 10/27/2019   Pain of right lower extremity 06/26/2019   Dizziness 06/26/2019   Herpes zoster without complication 33/29/5188   Angular cheilitis 05/04/2018   Hemorrhoids  04/11/2018   Lower abdominal pain 04/11/2018   Osteoporosis 11/15/2017   Prediabetes 11/15/2017   Presbycusis of both ears 11/16/2016   Anemia 06/20/2016   Dyspnea    NSTEMI (non-ST elevated myocardial infarction) ([place]) 06/14/2016   Unstable angina ([place]) 06/14/2016   Hx of adenomatous colonic polyps 04/06/2016   GERD without esophagitis 10/29/2015   IBS (irritable bowel syndrome) 10/29/2015   Urinary incontinence 10/29/2015   Osteoarthritis 41/66/0630   Diastolic dysfunction 16/03/930   Chest pain 11/23/2011   Chronic pain of left knee 01/08/2011   HYPOKALEMIA 07/31/2009   Mixed hyperlipidemia 05/22/2009   Anxiety and depression 05/22/2009   Essential hypertension 05/22/2009   Coronary artery disease involving native coronary artery of native heart 05/22/2009    Conditions to be addressed/monitored:COPD and Falls, Memory changes   Care Plan : [facility] plan of care  Updates made by Dannielle Karvonen, RN since 01/14/2021 12:00 AM     Problem: Chronic disease management, education and care coordination needs.   Priority: High     Long-Range Goal: Development of plan of care to address chronic disease managment and/or care coordination needs.   Start Date: 01/14/2021  Expected End Date: 04/29/2021  Priority: High  Note:   Current Barriers:  Knowledge Deficits related to plan of care for management of COPD and Falls, memory changes Chronic Disease Management support and education needs related to COPD and Falls, Memory changes Telephone call with patients designated party release/ daughter, [name]. HIPAA verified by daughter for patient. Daughter states patient had follow up with cardiologist since last outreach with [facility]  She states discussed with cardiologist that patient has peripheral artery disease and has blockages that causes her legs/ feet to feel numb.  Patient states this is more than likely why patient has frequent falls.  Daughter states patient not a candidate for  invasive treatment per her cardiologist therefore patient will continue to be treated medically.  Daughter states all of patients falls have occurred in the home.  She states patient will not use her walker at times even after frequent advisement.  She reports patient sustaining 1 fall  without injury since last outreach with RNCM on 12/09/2020. Daughter states family has decided not to pursue assisted living facility at this time.  Patient will remain living with her daughter, Dewaine Oats.  Daughter states patient continues to wear her oxygen 24/7.  No increase in COPD symptoms. Continues to take medications as prescribed. RNCM Clinical Goal(s):  Patient will verbalize understanding of plan for management of COPD and Falls, Memory changes take all medications exactly as prescribed and will call provider for medication related questions attend all scheduled medical appointments:   continue to work with RN Care Manager to address care management and care coordination needs related to  COPD and Falls, Memory changes  through collaboration with RN Care manager, provider, and care team.  Patient will demonstrate improved adherence to prescribed treatment plan for decreasing falls as evidenced by patient/ caregiver reporting and review of EMR.  Patient/ caregiver will verbalize using fall risk  reduction strategies discussed.   Interventions: 1:1 collaboration with primary care provider regarding development and update of comprehensive plan of care as evidenced by provider attestation and co-signature Inter-disciplinary care team collaboration (see longitudinal plan of care) Evaluation of current treatment plan related to  self management and patient's adherence to plan as established by provider  Memory changes Goal on track:  Yes Evaluation of current treatment plan related to  Memory changes , self-management and patient's adherence to plan as established by provider. Discussed plans with patient for ongoing  care management follow up and provided patient with direct contact information for care management team Evaluation of current treatment plan related to memory changes and patient's adherence to plan as established by provider; Reviewed scheduled/upcoming provider appointments including; Advised to keep regular routines and write down important pieces of information  Falls Interventions: Goal on track: Yes Reviewed medications and discussed potential side effects of medications such as dizziness and frequent urination; Advised patient of importance of notifying provider of falls; Assessed for falls since last encounter; Assessed patients knowledge of fall risk prevention secondary to previously provided education; Discussed fall prevention strategies Reviewed scheduled/ upcoming provider appointments  COPD Interventions:  Goal on track: Yes Advised patient to self assesses COPD action plan zone and make appointment with provider if in the yellow zone for 48 hours without improvement; Provided education about and advised patient to utilize infection prevention strategies to reduce risk of respiratory infection; Discussed COPD action plan and advised to call provider for symptoms Reviewed medication and advised to refill medications timely.   Patient Goals/Self-Care Activities: Patient will attend all scheduled provider appointments Patient will call pharmacy for medication refills Patient will call provider office for new concerns or questions Patient will take medications as prescribed Continue to utilize walker appropriately with all ambulation ( continue to follow fall precautions) Keep walkways de-cluttered Eliminate symptom triggers at home that will cause COPD to flare up Continue to wear oxygen as advised by your doctor Keep regular routines with patient and write down important pieces of information. ( Memory loss strategies)       Plan:The patient has been provided with  contact information for the care management team and has been advised to call with any health related questions or concerns.  The care management team will reach out to the patient again over the next 2-3 months.  Quinn Plowman RN,BSN,CCM RN Case Manager [facility]  [phone]

## 2021-01-27 DIAGNOSIS — R32 Unspecified urinary incontinence: Secondary | ICD-10-CM

## 2021-01-29 DIAGNOSIS — I2510 Atherosclerotic heart disease of native coronary artery without angina pectoris: Secondary | ICD-10-CM

## 2021-01-29 DIAGNOSIS — J449 Chronic obstructive pulmonary disease, unspecified: Secondary | ICD-10-CM

## 2021-02-11 ENCOUNTER — Telehealth: Payer: Medicare Other

## 2021-02-12 NOTE — Telephone Encounter (Signed)
Joellen, can you fax the letter I attached yesterday (12/13) to the insurance her daughter mentions in this message? Can you also respond once you have done so?

## 2021-02-12 NOTE — Telephone Encounter (Signed)
Called was wrong department given [phone] to try and call. Representative had no clue what I was talking bout states she only does medial authorizations. Informed her that it is a medical coverage but she advised I call number above to see if they could understand more.

## 2021-02-15 ENCOUNTER — Emergency Department (HOSPITAL_COMMUNITY): Payer: Medicare Other

## 2021-02-15 ENCOUNTER — Encounter (HOSPITAL_COMMUNITY): Payer: Self-pay

## 2021-02-15 ENCOUNTER — Inpatient Hospital Stay (HOSPITAL_COMMUNITY)
Admission: EM | Admit: 2021-02-15 | Discharge: 2021-02-19 | DRG: 064 | Disposition: A | Discharge status: Hospice | Payer: Medicare Other | Attending: Internal Medicine | Admitting: Internal Medicine

## 2021-02-15 ENCOUNTER — Observation Stay (HOSPITAL_COMMUNITY): Payer: Medicare Other

## 2021-02-15 DIAGNOSIS — R6889 Other general symptoms and signs: Secondary | ICD-10-CM | POA: Diagnosis not present

## 2021-02-15 DIAGNOSIS — Z8249 Family history of ischemic heart disease and other diseases of the circulatory system: Secondary | ICD-10-CM

## 2021-02-15 DIAGNOSIS — R410 Disorientation, unspecified: Secondary | ICD-10-CM | POA: Diagnosis not present

## 2021-02-15 DIAGNOSIS — I6300 Cerebral infarction due to thrombosis of unspecified precerebral artery: Secondary | ICD-10-CM | POA: Diagnosis not present

## 2021-02-15 DIAGNOSIS — K219 Gastro-esophageal reflux disease without esophagitis: Secondary | ICD-10-CM | POA: Diagnosis present

## 2021-02-15 DIAGNOSIS — Z743 Need for continuous supervision: Secondary | ICD-10-CM | POA: Diagnosis not present

## 2021-02-15 DIAGNOSIS — J9621 Acute and chronic respiratory failure with hypoxia: Secondary | ICD-10-CM | POA: Diagnosis not present

## 2021-02-15 DIAGNOSIS — E871 Hypo-osmolality and hyponatremia: Secondary | ICD-10-CM | POA: Diagnosis present

## 2021-02-15 DIAGNOSIS — S0990XA Unspecified injury of head, initial encounter: Secondary | ICD-10-CM | POA: Diagnosis not present

## 2021-02-15 DIAGNOSIS — R296 Repeated falls: Secondary | ICD-10-CM | POA: Diagnosis not present

## 2021-02-15 DIAGNOSIS — E785 Hyperlipidemia, unspecified: Secondary | ICD-10-CM | POA: Diagnosis present

## 2021-02-15 DIAGNOSIS — D539 Nutritional anemia, unspecified: Secondary | ICD-10-CM | POA: Diagnosis present

## 2021-02-15 DIAGNOSIS — I6611 Occlusion and stenosis of right anterior cerebral artery: Secondary | ICD-10-CM | POA: Diagnosis not present

## 2021-02-15 DIAGNOSIS — I10 Essential (primary) hypertension: Secondary | ICD-10-CM | POA: Diagnosis not present

## 2021-02-15 DIAGNOSIS — F32A Depression, unspecified: Secondary | ICD-10-CM | POA: Diagnosis present

## 2021-02-15 DIAGNOSIS — R29818 Other symptoms and signs involving the nervous system: Secondary | ICD-10-CM | POA: Diagnosis not present

## 2021-02-15 DIAGNOSIS — R4182 Altered mental status, unspecified: Secondary | ICD-10-CM | POA: Diagnosis not present

## 2021-02-15 DIAGNOSIS — Z20822 Contact with and (suspected) exposure to covid-19: Secondary | ICD-10-CM | POA: Diagnosis present

## 2021-02-15 DIAGNOSIS — Z9981 Dependence on supplemental oxygen: Secondary | ICD-10-CM

## 2021-02-15 DIAGNOSIS — R52 Pain, unspecified: Secondary | ICD-10-CM

## 2021-02-15 DIAGNOSIS — I110 Hypertensive heart disease with heart failure: Secondary | ICD-10-CM | POA: Diagnosis not present

## 2021-02-15 DIAGNOSIS — I739 Peripheral vascular disease, unspecified: Secondary | ICD-10-CM | POA: Diagnosis present

## 2021-02-15 DIAGNOSIS — I5032 Chronic diastolic (congestive) heart failure: Secondary | ICD-10-CM | POA: Diagnosis not present

## 2021-02-15 DIAGNOSIS — I63133 Cerebral infarction due to embolism of bilateral carotid arteries: Secondary | ICD-10-CM

## 2021-02-15 DIAGNOSIS — G9341 Metabolic encephalopathy: Secondary | ICD-10-CM | POA: Diagnosis present

## 2021-02-15 DIAGNOSIS — I639 Cerebral infarction, unspecified: Secondary | ICD-10-CM

## 2021-02-15 DIAGNOSIS — J189 Pneumonia, unspecified organism: Secondary | ICD-10-CM | POA: Diagnosis present

## 2021-02-15 DIAGNOSIS — I6310 Cerebral infarction due to embolism of unspecified precerebral artery: Secondary | ICD-10-CM

## 2021-02-15 DIAGNOSIS — I6340 Cerebral infarction due to embolism of unspecified cerebral artery: Secondary | ICD-10-CM | POA: Diagnosis not present

## 2021-02-15 DIAGNOSIS — M25552 Pain in left hip: Secondary | ICD-10-CM | POA: Diagnosis not present

## 2021-02-15 DIAGNOSIS — F419 Anxiety disorder, unspecified: Secondary | ICD-10-CM | POA: Diagnosis present

## 2021-02-15 DIAGNOSIS — Z888 Allergy status to other drugs, medicaments and biological substances status: Secondary | ICD-10-CM

## 2021-02-15 DIAGNOSIS — M47812 Spondylosis without myelopathy or radiculopathy, cervical region: Secondary | ICD-10-CM | POA: Diagnosis not present

## 2021-02-15 DIAGNOSIS — I2510 Atherosclerotic heart disease of native coronary artery without angina pectoris: Secondary | ICD-10-CM | POA: Diagnosis not present

## 2021-02-15 DIAGNOSIS — R404 Transient alteration of awareness: Secondary | ICD-10-CM | POA: Diagnosis not present

## 2021-02-15 DIAGNOSIS — I6523 Occlusion and stenosis of bilateral carotid arteries: Secondary | ICD-10-CM | POA: Diagnosis not present

## 2021-02-15 DIAGNOSIS — R062 Wheezing: Secondary | ICD-10-CM | POA: Diagnosis not present

## 2021-02-15 DIAGNOSIS — I517 Cardiomegaly: Secondary | ICD-10-CM | POA: Diagnosis not present

## 2021-02-15 DIAGNOSIS — J440 Chronic obstructive pulmonary disease with acute lower respiratory infection: Secondary | ICD-10-CM | POA: Diagnosis present

## 2021-02-15 DIAGNOSIS — J439 Emphysema, unspecified: Secondary | ICD-10-CM | POA: Diagnosis not present

## 2021-02-15 DIAGNOSIS — J441 Chronic obstructive pulmonary disease with (acute) exacerbation: Secondary | ICD-10-CM | POA: Diagnosis not present

## 2021-02-15 DIAGNOSIS — G459 Transient cerebral ischemic attack, unspecified: Secondary | ICD-10-CM | POA: Diagnosis not present

## 2021-02-15 DIAGNOSIS — I700 Atherosclerosis of aorta: Secondary | ICD-10-CM | POA: Diagnosis not present

## 2021-02-15 DIAGNOSIS — Z87891 Personal history of nicotine dependence: Secondary | ICD-10-CM

## 2021-02-15 DIAGNOSIS — I25119 Atherosclerotic heart disease of native coronary artery with unspecified angina pectoris: Secondary | ICD-10-CM | POA: Diagnosis not present

## 2021-02-15 DIAGNOSIS — I252 Old myocardial infarction: Secondary | ICD-10-CM

## 2021-02-15 DIAGNOSIS — Z66 Do not resuscitate: Secondary | ICD-10-CM | POA: Diagnosis not present

## 2021-02-15 DIAGNOSIS — Z7902 Long term (current) use of antithrombotics/antiplatelets: Secondary | ICD-10-CM

## 2021-02-15 DIAGNOSIS — R0902 Hypoxemia: Secondary | ICD-10-CM | POA: Diagnosis not present

## 2021-02-15 DIAGNOSIS — J9811 Atelectasis: Secondary | ICD-10-CM | POA: Diagnosis not present

## 2021-02-15 DIAGNOSIS — I672 Cerebral atherosclerosis: Secondary | ICD-10-CM | POA: Diagnosis not present

## 2021-02-15 DIAGNOSIS — R739 Hyperglycemia, unspecified: Secondary | ICD-10-CM | POA: Diagnosis not present

## 2021-02-15 DIAGNOSIS — I63233 Cerebral infarction due to unspecified occlusion or stenosis of bilateral carotid arteries: Secondary | ICD-10-CM | POA: Diagnosis not present

## 2021-02-15 DIAGNOSIS — Z79899 Other long term (current) drug therapy: Secondary | ICD-10-CM

## 2021-02-15 DIAGNOSIS — Z515 Encounter for palliative care: Secondary | ICD-10-CM | POA: Diagnosis not present

## 2021-02-15 DIAGNOSIS — R109 Unspecified abdominal pain: Secondary | ICD-10-CM | POA: Diagnosis not present

## 2021-02-15 DIAGNOSIS — I6389 Other cerebral infarction: Secondary | ICD-10-CM | POA: Diagnosis not present

## 2021-02-15 DIAGNOSIS — Z7982 Long term (current) use of aspirin: Secondary | ICD-10-CM

## 2021-02-15 DIAGNOSIS — Z7401 Bed confinement status: Secondary | ICD-10-CM | POA: Diagnosis not present

## 2021-02-15 DIAGNOSIS — W19XXXA Unspecified fall, initial encounter: Secondary | ICD-10-CM

## 2021-02-15 LAB — CBC WITH DIFFERENTIAL/PLATELET
Abs Immature Granulocytes: 0.05 10*3/uL (ref 0.00–0.07)
Basophils Absolute: 0 10*3/uL (ref 0.0–0.1)
Basophils Relative: 0 %
Eosinophils Absolute: 0.2 10*3/uL (ref 0.0–0.5)
Eosinophils Relative: 2 %
HCT: 34.1 % — ABNORMAL LOW (ref 36.0–46.0)
Hemoglobin: 10.8 g/dL — ABNORMAL LOW (ref 12.0–15.0)
Immature Granulocytes: 1 %
Lymphocytes Relative: 16 %
Lymphs Abs: 1.1 10*3/uL (ref 0.7–4.0)
MCH: 34 pg (ref 26.0–34.0)
MCHC: 31.7 g/dL (ref 30.0–36.0)
MCV: 107.2 fL — ABNORMAL HIGH (ref 80.0–100.0)
Monocytes Absolute: 0.6 10*3/uL (ref 0.1–1.0)
Monocytes Relative: 9 %
Neutro Abs: 5 10*3/uL (ref 1.7–7.7)
Neutrophils Relative %: 72 %
Platelets: 249 10*3/uL (ref 150–400)
RBC: 3.18 MIL/uL — ABNORMAL LOW (ref 3.87–5.11)
RDW: 11.3 % — ABNORMAL LOW (ref 11.5–15.5)
WBC: 6.9 10*3/uL (ref 4.0–10.5)
nRBC: 0 % (ref 0.0–0.2)

## 2021-02-15 LAB — LACTIC ACID, PLASMA: Lactic Acid, Venous: 0.5 mmol/L (ref 0.5–1.9)

## 2021-02-15 LAB — COMPREHENSIVE METABOLIC PANEL
ALT: 17 U/L (ref 0–44)
AST: 29 U/L (ref 15–41)
Albumin: 3 g/dL — ABNORMAL LOW (ref 3.5–5.0)
Alkaline Phosphatase: 43 U/L (ref 38–126)
Anion gap: 8 (ref 5–15)
BUN: 20 mg/dL (ref 8–23)
CO2: 26 mmol/L (ref 22–32)
Calcium: 9.2 mg/dL (ref 8.9–10.3)
Chloride: 99 mmol/L (ref 98–111)
Creatinine, Ser: 0.9 mg/dL (ref 0.44–1.00)
GFR, Estimated: >60 mL/min (ref >60)
Glucose, Bld: 136 mg/dL — ABNORMAL HIGH (ref 70–99)
Potassium: 4.2 mmol/L (ref 3.5–5.1)
Sodium: 133 mmol/L — ABNORMAL LOW (ref 135–145)
Total Bilirubin: 0.7 mg/dL (ref 0.3–1.2)
Total Protein: 6.8 g/dL (ref 6.5–8.1)

## 2021-02-15 LAB — RESP PANEL BY RT-PCR (FLU A&B, COVID) ARPGX2
Influenza A by PCR: NEGATIVE
Influenza B by PCR: NEGATIVE
SARS Coronavirus 2 by RT PCR: NEGATIVE

## 2021-02-15 LAB — BRAIN NATRIURETIC PEPTIDE: B Natriuretic Peptide: 262.1 pg/mL — ABNORMAL HIGH (ref 0.0–100.0)

## 2021-02-15 MED ORDER — SODIUM CHLORIDE 0.9 % IV SOLN
500.0000 mg | INTRAVENOUS | Status: DC
  Administered 2021-02-16 – 2021-02-17 (×2): 500 mg via INTRAVENOUS
  Filled 2021-02-15 (×3): qty 5

## 2021-02-15 MED ORDER — ENOXAPARIN SODIUM 40 MG/0.4ML IJ SOSY
40.0000 mg | PREFILLED_SYRINGE | INTRAMUSCULAR | Status: DC
  Administered 2021-02-15 – 2021-02-16 (×2): 40 mg via SUBCUTANEOUS
  Filled 2021-02-15 (×2): qty 0.4

## 2021-02-15 MED ORDER — STROKE: EARLY STAGES OF RECOVERY BOOK: Freq: Once | Status: DC

## 2021-02-15 MED ORDER — SODIUM CHLORIDE 0.9 % IV SOLN: 500.0000 mg | Freq: Once | INTRAVENOUS | Status: DC

## 2021-02-15 MED ORDER — SODIUM CHLORIDE 0.9 % IV BOLUS
500.0000 mL | Freq: Once | INTRAVENOUS | Status: AC
  Administered 2021-02-15: 500 mL via INTRAVENOUS

## 2021-02-15 MED ORDER — ACETAMINOPHEN 160 MG/5ML PO SOLN: 650.0000 mg | ORAL | Status: DC | PRN

## 2021-02-15 MED ORDER — SODIUM CHLORIDE 0.9 % IV SOLN: 1.0000 g | Freq: Once | INTRAVENOUS | Status: DC

## 2021-02-15 MED ORDER — SODIUM CHLORIDE 0.9 % IV SOLN: INTRAVENOUS | Status: DC

## 2021-02-15 MED ORDER — SENNOSIDES-DOCUSATE SODIUM 8.6-50 MG PO TABS: 1.0000 | ORAL_TABLET | Freq: Every evening | ORAL | Status: DC | PRN

## 2021-02-15 MED ORDER — ACETAMINOPHEN 325 MG PO TABS: 650.0000 mg | ORAL_TABLET | ORAL | Status: DC | PRN

## 2021-02-15 MED ORDER — IOHEXOL 350 MG/ML SOLN
50.0000 mL | Freq: Once | INTRAVENOUS | Status: AC | PRN
  Administered 2021-02-15: 50 mL via INTRAVENOUS

## 2021-02-15 MED ORDER — ACETAMINOPHEN 650 MG RE SUPP
650.0000 mg | RECTAL | Status: DC | PRN
  Administered 2021-02-18: 11:00:00 650 mg via RECTAL
  Filled 2021-02-15 (×2): qty 1

## 2021-02-15 MED ORDER — SODIUM CHLORIDE 0.9 % IV SOLN
1.0000 g | INTRAVENOUS | Status: DC
  Administered 2021-02-15 – 2021-02-17 (×2): 1 g via INTRAVENOUS
  Filled 2021-02-15 (×2): qty 10

## 2021-02-15 NOTE — ED Notes (Signed)
Pt transported to CT at this time.

## 2021-02-15 NOTE — ED Notes (Signed)
Pt transported to MRI w/ daughter

## 2021-02-15 NOTE — ED Provider Notes (Addendum)
[facility] EMERGENCY DEPARTMENT Provider Note   CSN: [id number] Arrival date & time: 02/15/21  1220     History Chief Complaint  Patient presents with   Altered Mental Status   Fall    Cindy Brandt is a 87 y.o. female.  Patient brought in by EMS.  Patient's apparently had 4 falls this week which is a bit unusual for her.  With one of the fall she did hit her head.  Today she had another fall and it seemed like right after that fall or immediately with that she was not able to move her left arm or left leg.  And her speech seemed slurred.  Daughter reports worsening confusion since that fall.  Patient very drowsy.  Patient without any respiratory symptoms patient without any nausea vomiting or diarrhea.  Past medical history sniffing for coronary artery disease hyperlipidemia hypertension diastolic dysfunction since 2013 COPD.  Daughter states that when EMS got there she was able to move her arm and leg.      Past Medical History:  Diagnosis Date   Acute respiratory failure with hypoxia ([place]) 12/23/2019   Allergy    Anemia    Anxiety    Arthritis    Blood transfusion without reported diagnosis    Cataract    a. bilerteral cataracts removed   COPD (chronic obstructive pulmonary disease) ([place])    Coronary atherosclerosis of native coronary artery    a. 2003 Cath/unsuccessful PCI of occluded RCA;  b. 2005 NSTEMI/Cath: CTO RCA w/ L->R collats, LAD 20, LCX 36m, nl EF;  c. 11/2011 Myoview: no ischemia.   Diastolic dysfunction 16/96/7893   a. 11/2011 Echo: Ejection fraction 60-65%, gr1 DD, basal inferior AK;  b. 09/2013 Echo: EF 55-60%, mild LVH, no rwma, Gr1 DD, mildly dil LA.   Gastritis    a. 04/2016 EGD: Nl esophagus, gastritis, nl duodenal bulb & 2nd portion of the duodenum.   Headache(784.0)    Hyperlipidemia    Hypertension    Hypokalemia    Pneumonia of both lungs due to infectious organism 12/23/2019   Sepsis ([place]) 12/23/2019   Tubular adenoma of colon  2013    Patient Active Problem List   Diagnosis Date Noted   Recurrent falls 11/01/2020   Memory changes 05/10/2020   Chronic obstructive pulmonary disease ([place]) 02/05/2020   Sensation of fullness in left ear 02/05/2020   Pain in both feet 01/04/2020   Chronic diastolic CHF (congestive heart failure) ([place]) 12/23/2019   Left bundle branch block 12/23/2019   Cough 10/27/2019   Pain of right lower extremity 06/26/2019   Dizziness 06/26/2019   Herpes zoster without complication 81/03/7508   Angular cheilitis 05/04/2018   Hemorrhoids 04/11/2018   Lower abdominal pain 04/11/2018   Osteoporosis 11/15/2017   Prediabetes 11/15/2017   Presbycusis of both ears 11/16/2016   Anemia 06/20/2016   Dyspnea    NSTEMI (non-ST elevated myocardial infarction) ([place]) 06/14/2016   Unstable angina ([place]) 06/14/2016   Hx of adenomatous colonic polyps 04/06/2016   GERD without esophagitis 10/29/2015   IBS (irritable bowel syndrome) 10/29/2015   Urinary incontinence 10/29/2015   Osteoarthritis 25/85/2778   Diastolic dysfunction 24/23/5361   Chest pain 11/23/2011   Chronic pain of left knee 01/08/2011   HYPOKALEMIA 07/31/2009   Mixed hyperlipidemia 05/22/2009   Anxiety and depression 05/22/2009   Essential hypertension 05/22/2009   Coronary artery disease involving native coronary artery of native heart 05/22/2009    Past Surgical History:  Procedure Laterality  Date   CARPAL TUNNEL RELEASE Bilateral    CHOLECYSTECTOMY  2004   COLONOSCOPY  08/06/2011   Procedure: COLONOSCOPY;  Surgeon: [name], MD;  Location: [facility];  Service: Endoscopy;  Laterality: N/A;  10:40 AM   CORONARY ANGIOGRAPHY N/A 08/02/2017   Procedure: CORONARY ANGIOGRAPHY;  Surgeon: Lorretta Harp, MD;  Location: [place] [facility];  Service: Cardiovascular;  Laterality: N/A;   GALLBLADDER SURGERY     LEFT HEART CATH AND CORONARY ANGIOGRAPHY N/A 06/15/2016   Procedure: Left Heart Cath and Coronary Angiography;  Surgeon:  Lorretta Harp, MD;  Location: [place] [facility];  Service: Cardiovascular;  Laterality: N/A;   PARTIAL HYSTERECTOMY     repair of belly button     UMBILICAL HERNIA REPAIR     Umbilical hernia repair as a child     OB History   No obstetric history on file.     Family History  Problem Relation Age of Onset   Coronary artery disease Father    Heart attack Father    Colon cancer Father    Coronary artery disease Mother    Stomach cancer Mother    Heart disease Brother    Diabetes Brother    Pancreatic cancer Neg Hx    Rectal cancer Neg Hx    Esophageal cancer Neg Hx     Social History   Tobacco Use   Smoking status: Former    Packs/day: 0.50    Years: 25.00    Pack years: 12.50    Types: Cigarettes    Quit date: 04/06/1996    Years since quitting: 24.8   Smokeless tobacco: Never  Vaping Use   Vaping Use: Never used  Substance Use Topics   Alcohol use: No   Drug use: No    Home Medications Prior to Admission medications   Medication Sig Start Date End Date Taking? Authorizing Provider  albuterol (PROVENTIL) (2.5 MG/3ML) 0.083% nebulizer solution Take 3 mLs (2.5 mg total) by nebulization every 6 (six) hours as needed for wheezing or shortness of breath. 02/26/20   Icard, Leory Plowman L, DO  amLODipine (NORVASC) 5 MG tablet Take 1 tablet (5 mg total) by mouth daily. 12/10/20   Lorretta Harp, MD  aspirin EC 81 MG tablet Take 81 mg by mouth every morning.    [provider]  atenolol (TENORMIN) 25 MG tablet TAKE 1 TABLET BY MOUTH EVERY DAY 12/23/20   Lorretta Harp, MD  atorvastatin (LIPITOR) 80 MG tablet TAKE 1 TABLET BY MOUTH EVERY DAY IN THE EVENING FOR CHOLESTEROL 08/29/20   [name], NP  calcium carbonate (OS-CAL) 600 MG TABS tablet Take 1,200 mg by mouth daily.    [provider]  cetirizine (ZYRTEC) 10 MG tablet Take 10 mg by mouth daily.    [provider]  clopidogrel (PLAVIX) 75 MG tablet NEW PRESCRIPTION REQUEST: TAKE  ONE TABLET BY MOUTH DAILY 12/03/20   Lorretta Harp, MD  famotidine (PEPCID) 20 MG tablet Take 20 mg by mouth daily.     [provider]  fluticasone (FLONASE) 50 MCG/ACT nasal spray PLACE 1 SPRAY INTO BOTH NOSTRILS 2 (TWO) TIMES DAILY 03/27/20   [name], NP  Fluticasone-Umeclidin-Vilant (TRELEGY ELLIPTA) 100-62.5-25 MCG/INH AEPB Inhale 1 puff into the lungs daily. 02/26/20   Icard, Octavio Graves, DO  gabapentin (NEURONTIN) 100 MG capsule Take 1 capsule (100 mg total) by mouth at bedtime as needed. For pain. 11/01/20   Alma Friendly  K, NP  isosorbide mononitrate (IMDUR) 120 MG 24 hr tablet Take 1 tablet (120 mg total) by mouth in the morning. 10/24/20   Deberah Pelton, NP  Melatonin 5 MG TABS Take 5 mg by mouth at bedtime as needed (sleep).     [provider]  nitroGLYCERIN (NITROSTAT) 0.4 MG SL tablet PLACE 1 TABLET UNDER THE TONGUE EVERY 5 MIN AS NEEDED FOR CHEST PAIN 10/03/20   Lorretta Harp, MD  pantoprazole (PROTONIX) 40 MG tablet TAKE 1 TABLET BY MOUTH ONCE DAILY FOR HEARTBURN. 04/09/20   [name], NP  PROAIR HFA 108 [phone] Base) MCG/ACT inhaler NEW PRESCRIPTION REQUEST: INHALE ONE PUFF BY MOUTH EVERY FOUR HOURS AS NEEDED 01/19/20   [name], NP  sertraline (ZOLOFT) 100 MG tablet TAKE 1 TABLET BY MOUTH ONCE DAILY FOR DEPRESSION/ANXIETY. 09/04/20   [name], NP  solifenacin (VESICARE) 5 MG tablet TAKE 1 TABLET (5 MG TOTAL) BY MOUTH DAILY. FOR OVERACTIVE BLADDER. 04/09/20   [name], NP  spironolactone (ALDACTONE) 25 MG tablet TAKE 1/2 TABLET BY MOUTH EVERY DAY 09/04/20   Lorretta Harp, MD    Allergies    Ranexa [ranolazine]  Review of Systems   Review of Systems  Unable to perform ROS: Mental status change   Physical Exam Updated Vital Signs BP (!) 169/80    Pulse 69    Temp 98.9 F (37.2 C)    Resp 19    Ht 1.651 m (5\' 5" )    Wt 64.3 kg    SpO2 95%    BMI 23.59 kg/m   Physical Exam Vitals and nursing note reviewed.   Constitutional:      General: She is not in acute distress.    Appearance: She is well-developed. She is ill-appearing.     Comments: Patient will wake up but she is drowsy  HENT:     Head: Normocephalic and atraumatic.  Eyes:     Conjunctiva/sclera: Conjunctivae normal.     Pupils: Pupils are equal, round, and reactive to light.  Cardiovascular:     Rate and Rhythm: Normal rate and regular rhythm.     Heart sounds: No murmur heard. Pulmonary:     Effort: Pulmonary effort is normal. No respiratory distress.     Breath sounds: Normal breath sounds.  Abdominal:     Palpations: Abdomen is soft.     Tenderness: There is no abdominal tenderness.  Musculoskeletal:        General: No swelling.     Cervical back: Neck supple.     Right lower leg: No edema.     Left lower leg: Edema present.     Comments: Trace swelling to the left lower extremity.  Able to raise her left arm and right arm and also able to move her toes on her left foot.  Also able to move the right lower extremity.  No pain with movement of any of the extremities.  No obvious deformity.  Skin:    General: Skin is warm and dry.     Capillary Refill: Capillary refill takes less than 2 seconds.  Neurological:     Mental Status: She is alert.     Comments: Patient will awake to voice command.  Will follow some simple commands.  Patient is able to raise the left arm.  Able to wiggle her toes in the left foot.  Movement on the right side seems to be more baseline according to the daughter.  And seems pretty normal.  Psychiatric:        Mood and Affect: Mood normal.    ED Results / Procedures / Treatments   Labs (all labs ordered are listed, but only abnormal results are displayed) Labs Reviewed  COMPREHENSIVE METABOLIC PANEL - Abnormal; Notable for the following components:      Result Value   Sodium 133 (*)    Glucose, Bld 136 (*)    Albumin 3.0 (*)    All other components within normal limits  CBC WITH  DIFFERENTIAL/PLATELET - Abnormal; Notable for the following components:   RBC 3.18 (*)    Hemoglobin 10.8 (*)    HCT 34.1 (*)    MCV 107.2 (*)    RDW 11.3 (*)    All other components within normal limits  BRAIN NATRIURETIC PEPTIDE - Abnormal; Notable for the following components:   B Natriuretic Peptide 262.1 (*)    All other components within normal limits  RESP PANEL BY RT-PCR (FLU A&B, COVID) [id number]  URINALYSIS, ROUTINE W REFLEX MICROSCOPIC    EKG EKG Interpretation  Date/Time:  Saturday February 15 2021 12:43:19 EST Ventricular Rate:  67 PR Interval:  176 QRS Duration: 154 QT Interval:  462 QTC Calculation: 488 R Axis:   34 Text Interpretation: Sinus rhythm Right bundle branch block No significant change since last tracing Confirmed by Fredia Sorrow [phone]) on 02/15/2021 1:51:54 PM  Radiology DG Chest 1 View  Result Date: 02/15/2021 CLINICAL DATA:  Fall. EXAM: CHEST  1 VIEW COMPARISON:  Chest x-ray 10/18/2020. FINDINGS: There are atherosclerotic calcifications of the aorta. The cardiac silhouette is mildly enlarged, unchanged. There are patchy airspace opacities in the left lower lung. There is atelectasis in the right mid lung. There is no pleural effusion or pneumothorax. No acute fractures are seen. IMPRESSION: 1. Patchy left lower lobe airspace disease worrisome for infection. 2. Stable mild cardiomegaly. Electronically Signed   By: Ronney Asters M.D.   On: 02/15/2021 15:35   CT Head Wo Contrast  Result Date: 02/15/2021 CLINICAL DATA:  Head trauma, minor (Age >= 65y) Neuro deficit, acute, stroke suspected EXAM: CT HEAD WITHOUT CONTRAST TECHNIQUE: Contiguous axial images were obtained from the base of the skull through the vertex without intravenous contrast. COMPARISON:  Head CT 10/17/2020 FINDINGS: Brain: No evidence of acute intracranial hemorrhage or extra-axial collection.No evidence of mass lesion/concern mass effect.The ventricles are normal in size.Scattered  subcortical and periventricular white matter hypodensities, nonspecific but likely sequela of chronic small vessel ischemic disease. Vascular: No hyperdense vessel or unexpected calcification. Skull: Normal. Negative for fracture or focal lesion. Sinuses/Orbits: No acute finding. Other: None. IMPRESSION: No acute intracranial abnormality. Unchanged sequela of chronic small vessel ischemic disease. Electronically Signed   By: Maurine Simmering M.D.   On: 02/15/2021 15:19   CT Chest Wo Contrast  Result Date: 02/15/2021 CLINICAL DATA:  Patchy airspace disease in the left base on recent chest x-ray EXAM: CT CHEST WITHOUT CONTRAST TECHNIQUE: Multidetector CT imaging of the chest was performed following the standard protocol without IV contrast. COMPARISON:  Chest x-ray from earlier in the same day, CT from 12/23/2019 FINDINGS: Cardiovascular: Limited due to lack of IV contrast. Diffuse atherosclerotic calcifications of the thoracic aorta are noted. No aneurysmal dilatation is seen. No cardiac enlargement is noted. Coronary calcifications are seen. No pericardial effusion is noted. Mediastinum/Nodes: Thoracic inlet shows no acute abnormality. No sizable hilar or mediastinal adenopathy is noted. The esophagus as visualized is within normal limits. Lungs/Pleura: Emphysematous changes are  seen bilaterally. Left basilar consolidation is noted consistent with early infiltrate. No associated effusion is noted. No sizable parenchymal nodules are seen. Mild atelectatic changes are noted in the right lower lobe. Upper Abdomen: Gallbladder has been surgically removed. The remainder of the upper abdomen appears within normal limits. Musculoskeletal: Degenerative changes of the thoracic spine are noted. No acute rib abnormality is seen. Chronic L1 compression deformity is noted. IMPRESSION: Patchy consolidation in the left lower lobe similar to that seen on recent chest x-ray consistent with acute infiltrate. Mild basilar atelectasis  on the right. Aortic Atherosclerosis (ICD10-I70.0) and Emphysema (ICD10-J43.9). Electronically Signed   By: Inez Catalina M.D.   On: 02/15/2021 17:43   DG HIP UNILAT WITH PELVIS 2-3 VIEWS LEFT  Result Date: 02/15/2021 CLINICAL DATA:  Four falls this week.  Bilateral hip pain. EXAM: DG HIP (WITH OR WITHOUT PELVIS) 2-3V LEFT COMPARISON:  None. FINDINGS: No fracture or bone lesion. Hip joint normally spaced and aligned. Minor marginal osteophytes from the base of the superior femoral head. No other degenerative/arthropathic change. Dense atherosclerotic calcification along the iliac and femoral arteries. IMPRESSION: 1. No fracture or dislocation.  No acute finding. Electronically Signed   By: Lajean Manes M.D.   On: 02/15/2021 16:13   DG HIP UNILAT WITH PELVIS 2-3 VIEWS RIGHT  Result Date: 02/15/2021 CLINICAL DATA:  Four falls this week.  Pain. EXAM: DG HIP (WITH OR WITHOUT PELVIS) 2-3V RIGHT COMPARISON:  None. FINDINGS: Degenerative changes in the right hip without significant loss of joint space. No right hip fracture identified. Evaluation of the left hip is limited due to internal rotation but no fracture is seen. No other acute abnormalities. IMPRESSION: No right hip fracture identified. Evaluation left hip is limited as above. Electronically Signed   By: Dorise Bullion III M.D.   On: 02/15/2021 15:39    Procedures Procedures   Medications Ordered in ED Medications  0.9 %  sodium chloride infusion ( Intravenous New Bag/Given 02/15/21 1555)  sodium chloride 0.9 % bolus 500 mL (500 mLs Intravenous New Bag/Given 02/15/21 1555)    ED Course  I have reviewed the triage vital signs and the nursing notes.  Pertinent labs & imaging results that were available during my care of the patient were reviewed by me and considered in my medical decision making (see chart for details).    MDM Rules/Calculators/A&P                          CRITICAL CARE Performed by: Fredia Sorrow Total critical  care time: 35 minutes Critical care time was exclusive of separately billable procedures and treating other patients. Critical care was necessary to treat or prevent imminent or life-threatening deterioration. Critical care was time spent personally by me on the following activities: development of treatment plan with patient and/or surrogate as well as nursing, discussions with consultants, evaluation of patient's response to treatment, examination of patient, obtaining history from patient or surrogate, ordering and performing treatments and interventions, ordering and review of laboratory studies, ordering and review of radiographic studies, pulse oximetry and re-evaluation of patient's condition.   Patient will get CT head chest x-ray x-rays of both hips and pelvis due to the falls.  Head CT will be important.  COVID explains her mental status change.  Also clinically patient may have had a TIA since she did not have movement of the left upper extremity or left lower extremity coexisting with that fall.  Also will  check labs urinalysis screen for COVID and influenza.  Flu and COVID testing is negative.  Patient's electrolytes without significant abnormalities.  Renal functions normal.  Chest x-ray raise concerns for left lower lobe pneumonia.  CT chest confirms that.  We will treat with Rocephin and azithromycin.  Also there is the concern of may be a TIA.  Will discuss with the neuro hospitalist.  Patient can have MRI while in the hospital.  Patient is normally on oxygen at home.  But requiring some increased oxygen here.  Patient sats are around 94% on 3 L.  Final Clinical Impression(s) / ED Diagnoses Final diagnoses:  Community acquired pneumonia of left lower lobe of lung  Altered mental status, unspecified altered mental status type  TIA (transient ischemic attack)    Rx / DC Orders ED Discharge Orders     None        Fredia Sorrow, MD 02/15/21 1752  Addendum: Gust with  on-call neuro hospitalist.  They will not see her in consultation but do recommend MRI brain if that shows evidence of a stroke then call them.  Patient clearly needs admission for the left lower lobe pneumonia.    Fredia Sorrow, MD 02/15/21 1759

## 2021-02-15 NOTE — Plan of Care (Signed)
Called by Dr. Rogene Houston.  Patient with altered mental status and a fall.  Eventually found to have pneumonia.  Question of left arm or leg weakness that has since resolved.  Also concern for slurred speech. Given her advanced age and pneumonia, symptoms could just be related to those. I would recommend getting an MRI of the brain.  If positive for stroke, please reconsult the neurology service for formal consultation. Otherwise mainstay of treatment would be treatment of her toxic metabolic derangements including that of pneumonia. -- Amie Portland, MD Neurologist [facility] Pager: [phone]

## 2021-02-15 NOTE — Consult Note (Signed)
NEUROLOGY CONSULTATION NOTE   Date of service: February 15, 2021 Patient Name: Cindy Brandt MRN:  [id number] DOB:  [birth year] Reason for consult: "fall, pneumonia and suspected left sided weakness" Requesting Provider: Orene Desanctis, DO _ _ _   _ __   _ __ _ _  __ __   _ __   __ _  History of Present Illness  Cindy Brandt is a 87 y.o. female with PMH significant for COPD, CAD, HTN, HLD who presents with confusion and 4 falls this week which is unusual for her. She had a fall today and immediately afterwards, was noted to not be able to move her LUE and LLE along with slurred speech. Left sided weakness resolved by the time EMS got there.  She had MRI brain which demonstrated scattered small foci of restricted diffusion in the bilateral parietal lobes, right frontal lobe, and right internal capsule, most likely acute and/or Subacute infarcts. We were consulted for further evaluation.  Of note, she also has pneumonia noted on CXR and CT Chest for which she is being treated with Ceftriaxone and Azithromycin.  On my evaluation, she is encephalopathic and wants to be left alone. She is oriented to time, place and person but wants to be left alone.   I spoke to her daughter [name] over the phone. Reports that she had a fall around 1030 on 02/15/21. Was unable to move her left arm or leg and speech was slurred. They got her into the bed aound 11AM and she was still not moving her left arm and leg. Her symptoms resolved by the time EMS got there. In terms of stroke risk factors, no prior history of strokes, BP well controlled at home and runs low about 112/60 BP at home. Takes DAPT at home for several years for CAD. No history of DM2. No prior history of stroke. No family history of stroke. Does not smoke, quit in 1995. No prior history of Afibb.  Has had 4-5 falls over the last year and then 4 in the last week.    mRS: 3. before last week, needed help with getting in and out of shower but was able  to shower herself. Able to fix small meals. Able to go up and down stairs herself. tNKAse: outside window Thrombectomy: low concern for LVO. NIHSS components Score: Comment  1a Level of Conscious 0[x]  1[]  2[]  3[]      1b LOC Questions 0[x]  1[]  2[]       1c LOC Commands 0[x]  1[]  2[]       2 Best Gaze 0[x]  1[]  2[]       3 Visual 0[x]  1[]  2[]  3[]      4 Facial Palsy 0[x]  1[]  2[]  3[]      5a Motor Arm - left 0[x]  1[]  2[]  3[]  4[]  UN[]    5b Motor Arm - Right 0[x]  1[]  2[]  3[]  4[]  UN[]    6a Motor Leg - Left 0[]  1[x]  2[]  3[]  4[]  UN[]    6b Motor Leg - Right 0[x]  1[]  2[]  3[]  4[]  UN[]    7 Limb Ataxia 0[x]  1[]  2[]  3[]  UN[]     8 Sensory 0[x]  1[]  2[]  UN[]      9 Best Language 0[x]  1[]  2[]  3[]      10 Dysarthria 0[x]  1[]  2[]  UN[]      11 Extinct. and Inattention 0[x]  1[]  2[]       TOTAL: 1      ROS   Unable to get detailed ROS due to poor participation. Has cough and  wants to be left alone.  Past History   Past Medical History:  Diagnosis Date   Acute respiratory failure with hypoxia ([place]) 12/23/2019   Allergy    Anemia    Anxiety    Arthritis    Blood transfusion without reported diagnosis    Cataract    a. bilerteral cataracts removed   COPD (chronic obstructive pulmonary disease) ([place])    Coronary atherosclerosis of native coronary artery    a. 2003 Cath/unsuccessful PCI of occluded RCA;  b. 2005 NSTEMI/Cath: CTO RCA w/ L->R collats, LAD 20, LCX 47m, nl EF;  c. 11/2011 Myoview: no ischemia.   Diastolic dysfunction 40/98/1191   a. 11/2011 Echo: Ejection fraction 60-65%, gr1 DD, basal inferior AK;  b. 09/2013 Echo: EF 55-60%, mild LVH, no rwma, Gr1 DD, mildly dil LA.   Gastritis    a. 04/2016 EGD: Nl esophagus, gastritis, nl duodenal bulb & 2nd portion of the duodenum.   Headache(784.0)    Hyperlipidemia    Hypertension    Hypokalemia    Pneumonia of both lungs due to infectious organism 12/23/2019   Sepsis ([place]) 12/23/2019   Tubular adenoma of colon 2013   Past Surgical History:  Procedure  Laterality Date   CARPAL TUNNEL RELEASE Bilateral    CHOLECYSTECTOMY  2004   COLONOSCOPY  08/06/2011   Procedure: COLONOSCOPY;  Surgeon: [name], MD;  [facility];  Service: Endoscopy;  Laterality: N/A;  10:40 AM   CORONARY ANGIOGRAPHY N/A 08/02/2017   Procedure: CORONARY ANGIOGRAPHY;  Surgeon: Lorretta Harp, MD;  Location: [facility];  Service: Cardiovascular;  Laterality: N/A;   GALLBLADDER SURGERY     LEFT HEART CATH AND CORONARY ANGIOGRAPHY N/A 06/15/2016   Procedure: Left Heart Cath and Coronary Angiography;  Surgeon: Lorretta Harp, MD;  Location: [facility];  Service: Cardiovascular;  Laterality: N/A;   PARTIAL HYSTERECTOMY     repair of belly button     UMBILICAL HERNIA REPAIR     Umbilical hernia repair as a child   Family History  Problem Relation Age of Onset   Coronary artery disease Father    Heart attack Father    Colon cancer Father    Coronary artery disease Mother    Stomach cancer Mother    Heart disease Brother    Diabetes Brother    Pancreatic cancer Neg Hx    Rectal cancer Neg Hx    Esophageal cancer Neg Hx    Social History   Socioeconomic History   Marital status: Widowed    Spouse name: Not on file   Number of children: 5   Years of education: Not on file   Highest education level: Not on file  Occupational History   Occupation: Retired  Tobacco Use   Smoking status: Former    Packs/day: 0.50    Years: 25.00    Pack years: 12.50    Types: Cigarettes    Quit date: 04/06/1996    Years since quitting: 24.8   Smokeless tobacco: Never  Vaping Use   Vaping Use: Never used  Substance and Sexual Activity   Alcohol use: No   Drug use: No   Sexual activity: Not on file  Other Topics Concern   Not on file  Social History Narrative   Currently living with dtr in [place].  No regular exercise.   Social Determinants of Health   Financial Resource Strain: Low Risk    Difficulty of Paying Living Expenses:  Not hard at  all  Food Insecurity: No Food Insecurity   Worried About Charity fundraiser in the Last Year: Never true   Ran Out of Food in the Last Year: Never true  Transportation Needs: No Transportation Needs   Lack of Transportation (Medical): No   Lack of Transportation (Non-Medical): No  Physical Activity: Not on file  Stress: Not on file  Social Connections: Not on file   Allergies  Allergen Reactions   Ranexa [Ranolazine] Other (See Comments)    Does NOT "agree" with the patient- does not feel like herself    Medications  (Not in a hospital admission)    Vitals   Vitals:   02/15/21 1800 02/15/21 1815 02/15/21 1830 02/15/21 2019  BP: (!) 157/98 (!) 168/83 103/74   Pulse: 66 69 74   Resp: 19 19 19    Temp:      TempSrc:      SpO2: 92% 92% 93% 100%  Weight:      Height:         Body mass index is 23.59 kg/m.  Physical Exam   General: Laying comfortably in bed; in no acute distress.  HENT: Normal oropharynx and mucosa. Normal external appearance of ears and nose.  Neck: Supple, no pain or tenderness  CV: No JVD. No peripheral edema.  Pulmonary: Symmetric Chest rise. Normal respiratory effort.  Abdomen: Soft to touch, non-tender.  Ext: No cyanosis, edema, or deformity  Skin: No rash. Normal palpation of skin.   Musculoskeletal: Normal digits and nails by inspection. No clubbing.   Neurologic Examination  Mental status/Cognition: Alert, oriented to self, place, month and year, good attention.  Speech/language: Fluent, comprehension intact, object naming intact, repetition intact.  Cranial nerves:   CN II Pupils equal and reactive to light, no VF deficits    CN III,IV,VI EOM intact, no gaze preference or deviation, no nystagmus    CN V normal sensation in V1, V2, and V3 segments bilaterally    CN VII no asymmetry, no nasolabial fold flattening    CN VIII normal hearing to speech    CN IX & X normal palatal elevation, no uvular deviation    CN XI 5/5 head turn and 5/5  shoulder shrug bilaterally    CN XII midline tongue protrusion    Motor:  Muscle bulk: poor, tone normal, pronator drift none tremor none.  Unable to do detailed sterngth testing due to poor participation. Mvmt Root Nerve  Muscle Right Left Comments  SA C5/6 Ax Deltoid     EF C5/6 Mc Biceps 5 4+   EE C6/7/8 Rad Triceps 5 4+   WF C6/7 Med FCR     WE C7/8 PIN ECU     F Ab C8/T1 U ADM/FDI 5 4+   HF L1/2/3 Fem Illopsoas 5 4   KE L2/3/4 Fem Quad     DF L4/5 D Peron Tib Ant     PF S1/2 Tibial Grc/Sol      Reflexes:  Right Left Comments  Pectoralis      Biceps (C5/6) 2 2   Brachioradialis (C5/6) 2 2    Triceps (C6/7) 2 2    Patellar (L3/4) 2 2    Achilles (S1)      Hoffman      Plantar     Jaw jerk    Sensation:  Light touch intact   Pin prick    Temperature    Vibration   Proprioception    Coordination/Complex  Motor:  - Finger to Nose intact on the Right, would not do it with LUE - Heel to shin declined to do. - Rapid alternating movement declined by patient. - Gait: usnafe to assess given concern for L sided weakness.  Labs   CBC:  Recent Labs  Lab 02/15/21 1439  WBC 6.9  NEUTROABS 5.0  HGB 10.8*  HCT 34.1*  MCV 107.2*  PLT 924    Basic Metabolic Panel:  Lab Results  Component Value Date   NA 133 (L) 02/15/2021   K 4.2 02/15/2021   CO2 26 02/15/2021   GLUCOSE 136 (H) 02/15/2021   BUN 20 02/15/2021   CREATININE 0.90 02/15/2021   CALCIUM 9.2 02/15/2021   GFRNONAA >60 02/15/2021   GFRAA 65 04/22/2020   Lipid Panel:  Lab Results  Component Value Date   LDLCALC 56 04/22/2020   HgbA1c:  Lab Results  Component Value Date   HGBA1C 6.5 11/01/2020   Urine Drug Screen: No results found for: LABOPIA, COCAINSCRNUR, LABBENZ, AMPHETMU, THCU, LABBARB  Alcohol Level No results found for: Diablock  CT Head without contrast(Personally reviewed): CTH was negative for a large hypodensity concerning for a large territory infarct or hyperdensity concerning for an  ICH  CT angio Head and Neck with contrast: Pending.  MRI Brain(Personally reviewed): Motion degraded but notable for scattered small foci of restricted diffusion in the bilateral parietal lobes, right frontal lobe, and right internal capsule, most likely acute and/or Subacute infarcts.  Impression   Cindy Brandt is a 87 y.o. female with PMH significant for COPD, CAD, HTN, HLD who presents with confusion and 4 falls this week which is unusual for her. She had a fall today and immediately afterwards, was noted to not be able to move her LUE and LLE along with slurred speech. Left sided weakness resolved by the time EMS got there. Also found to have pneumonia. Difficult neuro exam due to poor participation but some concern for left sided weakness.  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   Secondary Diagnosis: Essential (primary) hypertension  Recommendations  Plan:  - Frequent Neuro checks per stroke unit protocol - Recommend Vascular imaging with CT Angio head and neck - Recommend obtaining TTE - Recommend obtaining Lipid panel with LDL - Please start statin if LDL > 70 - Recommend HbA1c - Antithrombotic - continue Aspirin 81mg  daily along with plavix 75mg  daily. - Recommend DVT ppx - SBP goal - permissive hypertension first 24 h < 220/110. Held home meds.  - Recommend Telemetry monitoring for arrythmia - Recommend bedside swallow screen prior to PO intake. - Stroke education booklet - Recommend PT/OT/SLP consult - Management of Pneumonia per hospitalist team.  Plan discussed with Dr. Flossie Buffy with the hospitalist team.  ______________________________________________________________________   Thank you for the opportunity to take part in the care of this patient. If you have any further questions, please contact the neurology consultation attending.  Signed,  [place]ng Pager Number [phone] _ _ _    _ __   _ __ _ _  __ __   _ __   __ _

## 2021-02-15 NOTE — ED Triage Notes (Signed)
Daughter reports 4 falls this week. Pt hit her head on the 2nd fall. Hx of dementia. Daughter reports worsening confusion since the fall. Alert and oriented x 2.

## 2021-02-16 ENCOUNTER — Inpatient Hospital Stay (HOSPITAL_COMMUNITY): Payer: Medicare Other

## 2021-02-16 DIAGNOSIS — Z888 Allergy status to other drugs, medicaments and biological substances status: Secondary | ICD-10-CM | POA: Diagnosis not present

## 2021-02-16 DIAGNOSIS — I639 Cerebral infarction, unspecified: Secondary | ICD-10-CM

## 2021-02-16 DIAGNOSIS — Z66 Do not resuscitate: Secondary | ICD-10-CM | POA: Diagnosis present

## 2021-02-16 DIAGNOSIS — J441 Chronic obstructive pulmonary disease with (acute) exacerbation: Secondary | ICD-10-CM | POA: Diagnosis present

## 2021-02-16 DIAGNOSIS — J9621 Acute and chronic respiratory failure with hypoxia: Secondary | ICD-10-CM

## 2021-02-16 DIAGNOSIS — I739 Peripheral vascular disease, unspecified: Secondary | ICD-10-CM | POA: Diagnosis present

## 2021-02-16 DIAGNOSIS — D539 Nutritional anemia, unspecified: Secondary | ICD-10-CM | POA: Diagnosis present

## 2021-02-16 DIAGNOSIS — R410 Disorientation, unspecified: Secondary | ICD-10-CM | POA: Diagnosis not present

## 2021-02-16 DIAGNOSIS — I110 Hypertensive heart disease with heart failure: Secondary | ICD-10-CM | POA: Diagnosis present

## 2021-02-16 DIAGNOSIS — F419 Anxiety disorder, unspecified: Secondary | ICD-10-CM | POA: Diagnosis present

## 2021-02-16 DIAGNOSIS — I5032 Chronic diastolic (congestive) heart failure: Secondary | ICD-10-CM | POA: Diagnosis present

## 2021-02-16 DIAGNOSIS — I252 Old myocardial infarction: Secondary | ICD-10-CM | POA: Diagnosis not present

## 2021-02-16 DIAGNOSIS — I6300 Cerebral infarction due to thrombosis of unspecified precerebral artery: Secondary | ICD-10-CM | POA: Diagnosis not present

## 2021-02-16 DIAGNOSIS — G9341 Metabolic encephalopathy: Secondary | ICD-10-CM | POA: Diagnosis present

## 2021-02-16 DIAGNOSIS — I6389 Other cerebral infarction: Secondary | ICD-10-CM | POA: Diagnosis not present

## 2021-02-16 DIAGNOSIS — J189 Pneumonia, unspecified organism: Secondary | ICD-10-CM

## 2021-02-16 DIAGNOSIS — Z515 Encounter for palliative care: Secondary | ICD-10-CM | POA: Diagnosis not present

## 2021-02-16 DIAGNOSIS — Z9981 Dependence on supplemental oxygen: Secondary | ICD-10-CM | POA: Diagnosis not present

## 2021-02-16 DIAGNOSIS — F32A Depression, unspecified: Secondary | ICD-10-CM | POA: Diagnosis present

## 2021-02-16 DIAGNOSIS — J440 Chronic obstructive pulmonary disease with acute lower respiratory infection: Secondary | ICD-10-CM | POA: Diagnosis present

## 2021-02-16 DIAGNOSIS — Z20822 Contact with and (suspected) exposure to covid-19: Secondary | ICD-10-CM | POA: Diagnosis present

## 2021-02-16 DIAGNOSIS — E871 Hypo-osmolality and hyponatremia: Secondary | ICD-10-CM | POA: Diagnosis present

## 2021-02-16 DIAGNOSIS — K219 Gastro-esophageal reflux disease without esophagitis: Secondary | ICD-10-CM | POA: Diagnosis present

## 2021-02-16 DIAGNOSIS — I2510 Atherosclerotic heart disease of native coronary artery without angina pectoris: Secondary | ICD-10-CM | POA: Diagnosis present

## 2021-02-16 DIAGNOSIS — I6310 Cerebral infarction due to embolism of unspecified precerebral artery: Secondary | ICD-10-CM | POA: Diagnosis not present

## 2021-02-16 DIAGNOSIS — R296 Repeated falls: Secondary | ICD-10-CM | POA: Diagnosis present

## 2021-02-16 DIAGNOSIS — E785 Hyperlipidemia, unspecified: Secondary | ICD-10-CM | POA: Diagnosis present

## 2021-02-16 DIAGNOSIS — R062 Wheezing: Secondary | ICD-10-CM | POA: Diagnosis present

## 2021-02-16 DIAGNOSIS — R4182 Altered mental status, unspecified: Secondary | ICD-10-CM | POA: Diagnosis not present

## 2021-02-16 DIAGNOSIS — I6523 Occlusion and stenosis of bilateral carotid arteries: Secondary | ICD-10-CM | POA: Diagnosis present

## 2021-02-16 DIAGNOSIS — I6340 Cerebral infarction due to embolism of unspecified cerebral artery: Secondary | ICD-10-CM | POA: Diagnosis present

## 2021-02-16 LAB — ECHOCARDIOGRAM COMPLETE
AR max vel: 2.12 cm2
AV Peak grad: 9.1 mmHg
Ao pk vel: 1.51 m/s
Area-P 1/2: 3.46 cm2
Calc EF: 62 %
Height: 65 in
P 1/2 time: 457 msec
S' Lateral: 2.4 cm
Single Plane A2C EF: 59 %
Single Plane A4C EF: 62.8 %
Weight: 2268.09 oz

## 2021-02-16 LAB — URINALYSIS, MICROSCOPIC (REFLEX): Bacteria, UA: NONE SEEN

## 2021-02-16 LAB — LIPID PANEL
Cholesterol: 87 mg/dL (ref 0–200)
HDL: 34 mg/dL — ABNORMAL LOW (ref >40)
LDL Cholesterol: 41 mg/dL (ref 0–99)
Total CHOL/HDL Ratio: 2.6 RATIO
Triglycerides: 58 mg/dL (ref <150)
VLDL: 12 mg/dL (ref 0–40)

## 2021-02-16 LAB — URINALYSIS, ROUTINE W REFLEX MICROSCOPIC
Bilirubin Urine: NEGATIVE
Glucose, UA: NEGATIVE mg/dL
Ketones, ur: 15 mg/dL — AB
Leukocytes,Ua: NEGATIVE
Nitrite: NEGATIVE
Protein, ur: 100 mg/dL — AB
Specific Gravity, Urine: 1.025 (ref 1.005–1.030)
pH: 5.5 (ref 5.0–8.0)

## 2021-02-16 LAB — HEMOGLOBIN A1C
Hgb A1c MFr Bld: 6.5 % — ABNORMAL HIGH (ref 4.8–5.6)
Mean Plasma Glucose: 139.85 mg/dL

## 2021-02-16 MED ORDER — ISOSORBIDE MONONITRATE ER 60 MG PO TB24
120.0000 mg | ORAL_TABLET | Freq: Every day | ORAL | Status: DC
  Administered 2021-02-16: 10:00:00 120 mg via ORAL
  Filled 2021-02-16: qty 4

## 2021-02-16 MED ORDER — DARIFENACIN HYDROBROMIDE ER 7.5 MG PO TB24
7.5000 mg | ORAL_TABLET | Freq: Every day | ORAL | Status: DC
  Filled 2021-02-16 (×2): qty 1

## 2021-02-16 MED ORDER — FLUTICASONE-UMECLIDIN-VILANT 100-62.5-25 MCG/ACT IN AEPB: 1.0000 | INHALATION_SPRAY | Freq: Every day | RESPIRATORY_TRACT | Status: DC

## 2021-02-16 MED ORDER — FLUTICASONE FUROATE-VILANTEROL 100-25 MCG/ACT IN AEPB
1.0000 | INHALATION_SPRAY | Freq: Every day | RESPIRATORY_TRACT | Status: DC
Start: 2021-02-16 — End: 2021-02-17
  Filled 2021-02-16: qty 28

## 2021-02-16 MED ORDER — HALOPERIDOL LACTATE 5 MG/ML IJ SOLN
2.0000 mg | Freq: Four times a day (QID) | INTRAMUSCULAR | Status: DC | PRN
  Administered 2021-02-16: 19:00:00 2 mg via INTRAVENOUS
  Filled 2021-02-16 (×3): qty 1

## 2021-02-16 MED ORDER — CLOPIDOGREL BISULFATE 75 MG PO TABS
75.0000 mg | ORAL_TABLET | Freq: Every day | ORAL | Status: DC
  Administered 2021-02-16: 10:00:00 75 mg via ORAL
  Filled 2021-02-16: qty 1

## 2021-02-16 MED ORDER — FAMOTIDINE 20 MG PO TABS
20.0000 mg | ORAL_TABLET | Freq: Every day | ORAL | Status: DC
  Administered 2021-02-16: 10:00:00 20 mg via ORAL
  Filled 2021-02-16: qty 1

## 2021-02-16 MED ORDER — SERTRALINE HCL 100 MG PO TABS: 100.0000 mg | ORAL_TABLET | Freq: Every day | ORAL | Status: DC

## 2021-02-16 MED ORDER — UMECLIDINIUM BROMIDE 62.5 MCG/ACT IN AEPB
1.0000 | INHALATION_SPRAY | Freq: Every day | RESPIRATORY_TRACT | Status: DC
Start: 2021-02-16 — End: 2021-02-17
  Filled 2021-02-16: qty 7

## 2021-02-16 MED ORDER — ATORVASTATIN CALCIUM 80 MG PO TABS
80.0000 mg | ORAL_TABLET | Freq: Every day | ORAL | Status: DC
  Administered 2021-02-16: 10:00:00 80 mg via ORAL
  Filled 2021-02-16: qty 2

## 2021-02-16 MED ORDER — ASPIRIN EC 81 MG PO TBEC
81.0000 mg | DELAYED_RELEASE_TABLET | Freq: Every morning | ORAL | Status: DC
  Filled 2021-02-16: qty 1

## 2021-02-16 MED ORDER — DOCUSATE SODIUM 100 MG PO CAPS: 200.0000 mg | ORAL_CAPSULE | Freq: Once | ORAL | Status: DC

## 2021-02-16 MED ORDER — BISACODYL 10 MG RE SUPP: 10.0000 mg | Freq: Once | RECTAL | Status: DC

## 2021-02-16 MED ORDER — PANTOPRAZOLE SODIUM 40 MG PO TBEC
40.0000 mg | DELAYED_RELEASE_TABLET | Freq: Every day | ORAL | Status: DC
  Administered 2021-02-16: 10:00:00 40 mg via ORAL
  Filled 2021-02-16: qty 1

## 2021-02-16 MED ORDER — ALBUTEROL SULFATE (2.5 MG/3ML) 0.083% IN NEBU: 2.5000 mg | INHALATION_SOLUTION | RESPIRATORY_TRACT | Status: DC | PRN

## 2021-02-16 MED ORDER — CALCIUM CARBONATE 1250 (500 CA) MG PO TABS: 1250.0000 mg | ORAL_TABLET | Freq: Every day | ORAL | Status: DC

## 2021-02-16 NOTE — Evaluation (Signed)
Clinical/Bedside Swallow Evaluation Patient Details  Name: Cindy Brandt MRN: [id number] Date of Birth: [birth year]  Today's Date: 02/16/2021 Time: SLP Start Time (ACUTE ONLY): 54 SLP Stop Time (ACUTE ONLY): 1626 SLP Time Calculation (min) (ACUTE ONLY): 14 min  Past Medical History:  Past Medical History:  Diagnosis Date   Acute respiratory failure with hypoxia ([place]) 12/23/2019   Allergy    Anemia    Anxiety    Arthritis    Blood transfusion without reported diagnosis    Cataract    a. bilerteral cataracts removed   COPD (chronic obstructive pulmonary disease) ([place])    Coronary atherosclerosis of native coronary artery    a. 2003 Cath/unsuccessful PCI of occluded RCA;  b. 2005 NSTEMI/Cath: CTO RCA w/ L->R collats, LAD 20, LCX 8m nl EF;  c. 11/2011 Myoview: no ischemia.   Diastolic dysfunction 054/62/7035  a. 11/2011 Echo: Ejection fraction 60-65%, gr1 DD, basal inferior AK;  b. 09/2013 Echo: EF 55-60%, mild LVH, no rwma, Gr1 DD, mildly dil LA.   Gastritis    a. 04/2016 EGD: Nl esophagus, gastritis, nl duodenal bulb & 2nd portion of the duodenum.   Headache(784.0)    Hyperlipidemia    Hypertension    Hypokalemia    Pneumonia of both lungs due to infectious organism 12/23/2019   Sepsis ([place] 12/23/2019   Tubular adenoma of colon 2013   Past Surgical History:  Past Surgical History:  Procedure Laterality Date   CARPAL TUNNEL RELEASE Bilateral    CHOLECYSTECTOMY  2004   COLONOSCOPY  08/06/2011   Procedure: COLONOSCOPY;  Surgeon: SDanie Binder MD;  [facility];  Service: Endoscopy;  Laterality: N/A;  10:40 AM   CORONARY ANGIOGRAPHY N/A 08/02/2017   Procedure: CORONARY ANGIOGRAPHY;  Surgeon: BLorretta Harp MD;  [facility];  Service: Cardiovascular;  Laterality: N/A;   GALLBLADDER SURGERY     LEFT HEART CATH AND CORONARY ANGIOGRAPHY N/A 06/15/2016   Procedure: Left Heart Cath and Coronary Angiography;  Surgeon: JLorretta Harp MD;  [facility];  Service: Cardiovascular;  Laterality: N/A;   PARTIAL HYSTERECTOMY     repair of belly button     UMBILICAL HERNIA REPAIR     Umbilical hernia repair as a child   HPI:  Pt is an 87y.o. female who presented 12/17 with concerns for recurrent falls and left-sided weakness. MRI found scattered infarcts in bilateral parietal lobe, right frontal lobe, right internal capsule. CTA head & neck - complete occlusion of the R ICA, likely chronic. Stenosis of L ICA, severe atherosclerotic disease in the aorta. Pt passed yale 12/18, but SLP consulted for swallowing due to pt "getting choked when eating foods". PMH: CAD s/p NSTEMI, chronic diastolic heart failure, COPD on 2L O2, HTN, PVD, anxiety and depression.    Assessment / Plan / Recommendation  Clinical Impression  Pt was seen in the ED for bedside swallow evaluation with her daughter present who denied the pt having a history of dysphagia. Pt presented with maxillary dentures only and pt's family reported that the pt typically eats all consistencies foods without difficulty. Per the daughter, pt exhibited coughing with mac and cheese at lunch, but she is unconvinced that the cough was related to the meal since she has been coughing in general. She tolerated all solids and liquids without s/sx of aspiration. Mastication was prolonged with regular textures, but otherwise WFL and oral clearance was adequate. A dysphagia 3 diet with thin  liquids will be initiated at this time and SLP will follow pt. SLP Visit Diagnosis: Dysphagia, unspecified (R13.10)    Aspiration Risk  Mild aspiration risk    Diet Recommendation Dysphagia 3 (Mech soft);Thin liquid   Liquid Administration via: Cup;Straw Medication Administration: Whole meds with puree Supervision: Staff to assist with self feeding Compensations: Slow rate;Small sips/bites Postural Changes: Seated upright at 90 degrees    Other  Recommendations Oral Care Recommendations: Oral care BID     Recommendations for follow up therapy are one component of a multi-disciplinary discharge planning process, led by the attending physician.  Recommendations may be updated based on patient status, additional functional criteria and insurance authorization.  Follow up Recommendations Skilled nursing-short term rehab (<3 hours/day)      Assistance Recommended at Discharge    Functional Status Assessment Patient has had a recent decline in their functional status and demonstrates the ability to make significant improvements in function in a reasonable and predictable amount of time.  Frequency and Duration min 2x/week  1 week       Prognosis Prognosis for Safe Diet Advancement: Good Barriers to Reach Goals: Cognitive deficits      Swallow Study   General Date of Onset: 02/15/21 HPI: Pt is an 87 y.o. female who presented 12/17 with concerns for recurrent falls and left-sided weakness. MRI found scattered infarcts in bilateral parietal lobe, right frontal lobe, right internal capsule. CTA head & neck - complete occlusion of the R ICA, likely chronic. Stenosis of L ICA, severe atherosclerotic disease in the aorta. Pt passed yale 12/18, but SLP consulted for swallowing due to pt "getting choked when eating foods". PMH: CAD s/p NSTEMI, chronic diastolic heart failure, COPD on 2L O2, HTN, PVD, anxiety and depression. Type of Study: Bedside Swallow Evaluation Previous Swallow Assessment: none Diet Prior to this Study: Regular;Thin liquids Temperature Spikes Noted: No Respiratory Status: Nasal cannula History of Recent Intubation: No Behavior/Cognition: Alert;Cooperative;Pleasant mood;Confused Oral Cavity Assessment: Within Functional Limits Oral Care Completed by SLP: No Oral Cavity - Dentition: Dentures, top;Edentulous Vision: Functional for self-feeding Self-Feeding Abilities: Total assist Patient Positioning: Upright in bed;Postural control adequate for testing Baseline Vocal Quality:  Normal Volitional Swallow: Able to elicit    Oral/Motor/Sensory Function Overall Oral Motor/Sensory Function: Within functional limits   Ice Chips Ice chips: Within functional limits Presentation: Spoon   Thin Liquid Thin Liquid: Within functional limits Presentation: Straw    Nectar Thick Nectar Thick Liquid: Not tested   Honey Thick Honey Thick Liquid: Not tested   Puree Puree: Within functional limits Presentation: Spoon   Solid     Solid: Impaired Presentation: Self Fed Oral Phase Impairments: Impaired mastication Oral Phase Functional Implications: Impaired mastication Pharyngeal Phase Impairments: Throat Clearing - Delayed     Brina Umeda I. Hardin Negus, [place], [place] Office number [phone] Pager [phone]  Horton Marshall 02/16/2021,5:15 PM

## 2021-02-16 NOTE — ED Notes (Signed)
Pt presents as confused having pulled IV out and removed pulse ox.  She knows she is at [facility] but seems otherwise confused.

## 2021-02-16 NOTE — Progress Notes (Signed)
PROGRESS NOTE  JOLEAN Brandt UDJ:[id number] DOB: 03-25-1931 DOA: 02/15/2021 PCP: [name], NP  Brief History    Cindy Brandt is a 87 y.o. female with medical history significant for CAD s/p NSTEMI, chronic diastolic heart failure, COPD with chronic hypoxemia on 2 L, hypertension, GERD, anxiety and depression who presents with concerns of recurrent fall and left-sided weakness.   Daughter at bedside provides history as patient was confused.  She reports that in the past week patient has fallen 4 times.  Patient complains of pain to her legs causing a fall which has been evaluated by cardiology before in the likely due to peripheral vascular disease.  Earlier today, she was found by family to be slumped over beside her chair and likely had fallen out of it.  When they attempted to pick her up felt like she was not moving her left arms or legs.  Also noted slurred speech prompting them to bring her here to the ER. Patient does not have history of previous stroke.  She is compliant on aspirin and Plavix for her CAD.  No prior history of atrial fibrillation.   ED Course: She was afebrile, mildly hypertensive BP of 165/75 on 3 L via nasal cannula. No leukocytosis, hemoglobin 10.8 Sodium of 133, K4.2, creatinine of 0.9, BG of 136 BNP of 262 CT chest showing left lower lobe infiltrate concerning for pneumonia Right hip x-ray was negative   Her symptoms largely resolved by the time she arrived in the ED. Neurology was initially called for concerns of TIA and MRI was recommended before considering further stroke work-up.  MRI has demonstrated scattered small foci of restricted diffusion in the bilateral parietal lobes, right frontal lobe, and right internal capsule, most likely acute and/or infarcts. Given multiple vascular territories, an embolic etiology is suspected.  Consultants  Neurology  Procedures  None  Antibiotics   Anti-infectives (From admission, onward)    Start      Dose/Rate Route Frequency Ordered Stop   02/15/21 2230  azithromycin (ZITHROMAX) 500 mg in sodium chloride 0.9 % 250 mL IVPB        500 mg 250 mL/hr over 60 Minutes Intravenous Every 24 hours 02/15/21 2123     02/15/21 2200  cefTRIAXone (ROCEPHIN) 1 g in sodium chloride 0.9 % 100 mL IVPB        1 g 200 mL/hr over 30 Minutes Intravenous Every 24 hours 02/15/21 2123     02/15/21 1800  cefTRIAXone (ROCEPHIN) 1 g in sodium chloride 0.9 % 100 mL IVPB  Status:  Discontinued        1 g 200 mL/hr over 30 Minutes Intravenous  Once 02/15/21 1756 02/15/21 2125   02/15/21 1800  azithromycin (ZITHROMAX) 500 mg in sodium chloride 0.9 % 250 mL IVPB  Status:  Discontinued        500 mg 250 mL/hr over 60 Minutes Intravenous  Once 02/15/21 1756 02/15/21 2126      Subjective  The patient is resting comfortably. She can answer simple questions with 1-3 words. Daughter is at bedside. No new complaints.  Objective   Vitals:  Vitals:   02/16/21 0930 02/16/21 1030  BP: 137/81 (!) 137/92  Pulse: 85 79  Resp: 20 20  Temp:    SpO2: 90% 96%    Exam:  Constitutional:  The patient is awake, alert, and oriented x 2. No acute distress. Eyes:  Appear deviated to the right Normal lids and conjunctivae Respiratory:  No increased work  of breathing. No wheezes, rales, or rhonchi No tactile fremitus Cardiovascular:  Regular rate and rhythm No murmurs, ectopy, or gallups. No lateral PMI. No thrills. Abdomen:  Abdomen is soft, non-tender, non-distended No hernias, masses, or organomegaly Normoactive bowel sounds.  Musculoskeletal:  No cyanosis, clubbing, or edema Skin:  No rashes, lesions, ulcers palpation of skin: no induration or nodules Neurologic:  Pt is having word finding difficulty and remains confused.  Per PT no lateralizing signs Right word gaze preference Psychiatric:  Unable to evaluate as patient is unable to cooperate with exam.  I have personally reviewed the following:    Today's Data  Vitals  Lab Data  CBC BMP  Micro Data  Blood culture: No growth  Imaging  CT head MRI Brain CTA head and neck  Cardiology Data  EKG  Scheduled Meds:   stroke: mapping our early stages of recovery book   Does not apply Once   aspirin EC  81 mg Oral q morning   atorvastatin  80 mg Oral Daily   calcium carbonate  1,250 mg Oral Q breakfast   clopidogrel  75 mg Oral Daily   darifenacin  7.5 mg Oral Daily   enoxaparin (LOVENOX) injection  40 mg Subcutaneous Q24H   famotidine  20 mg Oral Daily   fluticasone furoate-vilanterol  1 puff Inhalation Daily   And   umeclidinium bromide  1 puff Inhalation Daily   isosorbide mononitrate  120 mg Oral Daily   pantoprazole  40 mg Oral Daily   sertraline  100 mg Oral QHS   Continuous Infusions:  sodium chloride 75 mL/hr at 02/16/21 0602   azithromycin Stopped (02/16/21 0252)   cefTRIAXone (ROCEPHIN)  IV Stopped (02/16/21 0028)    Principal Problem:   CVA (cerebral vascular accident) ([place]) Active Problems:   Essential hypertension   Coronary artery disease involving native coronary artery of native heart   Chronic diastolic CHF (congestive heart failure) (HCC)   AMS (altered mental status)   Acute on chronic respiratory failure with hypoxemia ([place])   Community acquired pneumonia   LOS: 0 days   A & P  CVA -no IV tPA since outside of window - MRI brain showing scattered small foci of restricted diffusion in the bilateral parietal lobe, right frontal lobe and right internal capsule likely embolic source - CTA head and neck showed complete occlusion of the right RCA and severe focal stenosis in the proximal left ICA that neurology feels is likely chronic -Obtain echocardiogram  -Obtain A1c and lipids -PT/OT/SLT -Frequent neuro checks and keep on telemetry -Allow for permissive hypertension with blood pressure treatment as needed only if systolic goes above 527 -Continue home antiplatelet therapy with aspirin  and Plavix -Continue atorvastatin -Neurology has been consulted.  NPO until evaluated by SLP   Acute on chronic hypoxemic respiratory failure secondary to community-acquired pneumonia and COPD exacerbation - Patient at baseline on 2 L for her COPD.  Requiring 3 L on admission - CT chest showing patchy consolidation in the left lower lobe consistent with acute infiltrate -continue IV Rocephin and Azithromycin    Chronic diastolic HF -BNP is unequivocal at 262 and she appears to be euvolemic on exam.  CTA head and neck noted possible opacity concerning for pulmonary edema. Will monitor her intake and output. -hold spirolactone overnight while allowing for permissive HTN   CAD Continue aspirin and Plavix Continue Imdur   Hypertension Hold amlodipine, atenolol to allow for permissive hypertension   DVT prophylaxis:.Lovenox Code Status:Limited- CPR  only. Verified MOST form at bedside with daughter Family Communication: Plan discussed with daughter at bedside  disposition  Plan: tbd Consults called: neurology Admission status: Inpatient     Level of care: Telemetry Medical   Status is: Inpatient   Carmina Walle, DO [facility] Direct contact: see [email or web address]  7PM-7AM contact night coverage as above 02/16/2021, 1:19 PM  LOS: 0 days

## 2021-02-16 NOTE — Evaluation (Signed)
Speech Language Pathology Evaluation Patient Details Name: Cindy Brandt MRN: [id number] DOB: [birth year] Today's Date: 02/16/2021 Time: 1517-6160 SLP Time Calculation (min) (ACUTE ONLY): 25 min  Problem List:  Patient Active Problem List   Diagnosis Date Noted   CVA (cerebral vascular accident) ([place]) 02/16/2021   Acute on chronic respiratory failure with hypoxemia ([place]) 02/16/2021   Community acquired pneumonia 02/16/2021   AMS (altered mental status) 02/15/2021   Recurrent falls 11/01/2020   Memory changes 05/10/2020   Chronic obstructive pulmonary disease ([place]) 02/05/2020   Sensation of fullness in left ear 02/05/2020   Pain in both feet 01/04/2020   Chronic diastolic CHF (congestive heart failure) ([place]) 12/23/2019   Left bundle branch block 12/23/2019   Cough 10/27/2019   Pain of right lower extremity 06/26/2019   Dizziness 06/26/2019   Herpes zoster without complication 73/71/0626   Angular cheilitis 05/04/2018   Hemorrhoids 04/11/2018   Lower abdominal pain 04/11/2018   Osteoporosis 11/15/2017   Prediabetes 11/15/2017   Presbycusis of both ears 11/16/2016   Anemia 06/20/2016   Dyspnea    NSTEMI (non-ST elevated myocardial infarction) ([place]) 06/14/2016   Unstable angina ([place]) 06/14/2016   Hx of adenomatous colonic polyps 04/06/2016   GERD without esophagitis 10/29/2015   IBS (irritable bowel syndrome) 10/29/2015   Urinary incontinence 10/29/2015   Osteoarthritis 94/85/4627   Diastolic dysfunction 03/50/0938   Chest pain 11/23/2011   Chronic pain of left knee 01/08/2011   HYPOKALEMIA 07/31/2009   Mixed hyperlipidemia 05/22/2009   Anxiety and depression 05/22/2009   Essential hypertension 05/22/2009   Coronary artery disease involving native coronary artery of native heart 05/22/2009   Past Medical History:  Past Medical History:  Diagnosis Date   Acute respiratory failure with hypoxia ([place]) 12/23/2019   Allergy    Anemia    Anxiety    Arthritis    Blood  transfusion without reported diagnosis    Cataract    a. bilerteral cataracts removed   COPD (chronic obstructive pulmonary disease) ([place])    Coronary atherosclerosis of native coronary artery    a. 2003 Cath/unsuccessful PCI of occluded RCA;  b. 2005 NSTEMI/Cath: CTO RCA w/ L->R collats, LAD 20, LCX 58m, nl EF;  c. 11/2011 Myoview: no ischemia.   Diastolic dysfunction 18/29/9371   a. 11/2011 Echo: Ejection fraction 60-65%, gr1 DD, basal inferior AK;  b. 09/2013 Echo: EF 55-60%, mild LVH, no rwma, Gr1 DD, mildly dil LA.   Gastritis    a. 04/2016 EGD: Nl esophagus, gastritis, nl duodenal bulb & 2nd portion of the duodenum.   Headache(784.0)    Hyperlipidemia    Hypertension    Hypokalemia    Pneumonia of both lungs due to infectious organism 12/23/2019   Sepsis ([place]) 12/23/2019   Tubular adenoma of colon 2013   Past Surgical History:  Past Surgical History:  Procedure Laterality Date   CARPAL TUNNEL RELEASE Bilateral    CHOLECYSTECTOMY  2004   COLONOSCOPY  08/06/2011   Procedure: COLONOSCOPY;  Surgeon: Cindy Binder, MD;  [facility];  Service: Endoscopy;  Laterality: N/A;  10:40 AM   CORONARY ANGIOGRAPHY N/A 08/02/2017   Procedure: CORONARY ANGIOGRAPHY;  Surgeon: Cindy Harp, MD;  Location: [facility];  Service: Cardiovascular;  Laterality: N/A;   GALLBLADDER SURGERY     LEFT HEART CATH AND CORONARY ANGIOGRAPHY N/A 06/15/2016   Procedure: Left Heart Cath and Coronary Angiography;  Surgeon: Cindy Harp, MD;  Location: [facility];  Service: Cardiovascular;  Laterality: N/A;   PARTIAL HYSTERECTOMY     repair of belly button     UMBILICAL HERNIA REPAIR     Umbilical hernia repair as a child   HPI:  Pt is an 87 y.o. female who presented 12/17 with concerns for recurrent falls and left-sided weakness. MRI found scattered infarcts in bilateral parietal lobe, right frontal lobe, right internal capsule. CTA head & neck - complete occlusion of the R ICA,  likely chronic. Stenosis of L ICA, severe atherosclerotic disease in the aorta. Pt passed yale 12/18, but SLP consulted for swallowing due to pt "getting choked when eating foods". PMH: CAD s/p NSTEMI, chronic diastolic heart failure, COPD on 2L O2, HTN, PVD, anxiety and depression.   Assessment / Plan / Recommendation Clinical Impression  Pt participated in speech/language/cognition evaluation with her daughter present at the beginning of the evaluation and her daughter-in-law present for the latter half of the evaluation. Pt's family reported some baseline deficits with memory and that her medications and finances were managed by her family. However, significant cognitive deficits were denied and pt's family reported that that the pt is "much different" from her baseline and described her behavior as confusion. Pt's motor speech skills were WNL. She demonstrated cognitive-linguistic impairments in the areas of attention, memory, orientation, awareness, problem solving, and executive function. 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 Skilled SLP services are clinically indicated at this time to improve cognitive-linguistic function.    SLP Assessment  SLP Recommendation/Assessment: Patient needs continued [facility] SLP Visit Diagnosis: Cognitive communication deficit (R41.841)    Recommendations for follow up therapy are one component of a multi-disciplinary discharge planning process, led by the attending physician.  Recommendations may be updated based on patient status, additional functional criteria and insurance authorization.    Follow Up Recommendations  Skilled nursing-short term rehab (<3 hours/day)    Assistance Recommended at Discharge  Frequent or constant Supervision/Assistance   Functional Status Assessment Patient has had a recent decline in their functional status and demonstrates the ability to make significant improvements in function in a reasonable and predictable amount of time.  Frequency and Duration min 2x/week  2 weeks      SLP Evaluation Cognition  Overall Cognitive Status: Impaired/Different from baseline Arousal/Alertness: Awake/alert Orientation Level: Oriented to person;Disoriented to time;Oriented to place;Disoriented to situation Year: 2025 Day of Week: Incorrect Attention: Focused;Sustained Focused Attention: Appears intact Sustained Attention: Impaired Sustained Attention Impairment: Verbal complex Memory: Impaired Memory Impairment: Retrieval deficit;Decreased recall of new information;Decreased short term memory (Immediate: 0/5) Awareness: Impaired Awareness Impairment: Intellectual impairment Problem Solving: Impaired Problem Solving Impairment: Verbal basic Executive Function: Sequencing Sequencing: Impaired Sequencing Impairment: Verbal complex       Comprehension  Auditory Comprehension Overall Auditory Comprehension: Appears within functional limits for tasks assessed Yes/No Questions: Impaired Basic Immediate Environment Questions:  (4/4) Complex Questions:  (0/4) Commands: Impaired Two Step Basic Commands:  (3/3) Multistep Basic Commands:  (1/3) Conversation: Simple    Expression Expression Primary Mode of Expression: Verbal Verbal Expression Overall Verbal Expression: Impaired Initiation: No impairment Level of Generative/Spontaneous Verbalization: Conversation Repetition: No impairment (4/4) Confrontation:  (5/5) Pragmatics: Impairment Impairments: Topic appropriateness;Topic maintenance;Turn Taking   Oral / Motor  Oral Motor/Sensory Function Overall Oral Motor/Sensory Function: Within functional limits Motor Speech Overall Motor Speech: Appears within functional limits for tasks assessed Respiration:  Within functional limits Phonation:  Normal Resonance: Within functional limits Articulation: Within functional limitis Intelligibility: Intelligible Motor Planning: Witnin functional limits Motor Speech Errors: Not applicable           Kaylub Detienne I. Hardin Negus, [place], [place] Office number [phone] Pager [phone]  Horton Marshall 02/16/2021, 5:38 PM

## 2021-02-16 NOTE — Evaluation (Signed)
Physical Therapy Evaluation Patient Details Name: Cindy Brandt MRN: [id number] DOB: 1931-09-04 Today's Date: 02/16/2021  History of Present Illness  Pt is a 87 y.o. F who presents 02/15/2021 with concerns for recurrent falls and left sided weakness. 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 CTA head & neck - complete occlusion of the R ICA, likely chronic. Stenosis of L ICA, severe atherosclerotic disease in the aorta. Significant PMH: CAD s/p NSTEMI, chronic diastolic heart failure, COPD on 2L O2, HTN, PVD, anxiety and depression.  Clinical Impression  PTA, pt lives with her daughter, is a limited household ambulator using a RW vs Rollator and requires assist for some ADL's. Pt daughter reports pt had recurrent falls prior to admission. Pt presents with poor sitting balance, generalized weakness (did not note discrepancy between right and left sides, but difficulty to fully assess due to cognition), cognitive impairments, right gaze preference, aphasia. Pt requiring two person maximal assist for bed mobility and attempt to stand. Demonstrates right lateral lean in sitting. BP 143/92, SpO2 94% on 2.5L, HR 75. Recommend SNF at discharge to address deficits, maximize functional mobility and decrease caregiver burden.      Recommendations for follow up therapy are one component of a multi-disciplinary discharge planning process, led by the attending physician.  Recommendations may be updated based on patient status, additional functional criteria and insurance authorization.  Follow Up Recommendations Skilled nursing-short term rehab (<3 hours/day)    Assistance Recommended at Discharge Frequent or constant Supervision/Assistance  Functional Status Assessment Patient has had a recent decline in their functional status and demonstrates the ability to make significant improvements in function in a reasonable  and predictable amount of time.  Equipment Recommendations  Other (comment) (TBA)    Recommendations for Other Services       Precautions / Restrictions Precautions Precautions: Fall Restrictions Weight Bearing Restrictions: No      Mobility  Bed Mobility Overal bed mobility: Needs Assistance Bed Mobility: Supine to Sit;Sit to Supine     Supine to sit: Max assist;+2 for physical assistance Sit to supine: Max assist;+2 for physical assistance   General bed mobility comments: Decreased initiation by pt, maxA + 2 for supine <> sit from stretcher    Transfers Overall transfer level: Needs assistance Equipment used: None Transfers: Sit to/from Stand Sit to Stand: Max assist;+2 safety/equipment           General transfer comment: Face to face transfer with maxA, pt unable to accept significant weight through legs    Ambulation/Gait               General Gait Details: unable  Stairs            Wheelchair Mobility    Modified Rankin (Stroke Patients Only) Modified Rankin (Stroke Patients Only) Pre-Morbid Rankin Score: Moderately severe disability Modified Rankin: Severe disability     Balance Overall balance assessment: Needs assistance Sitting-balance support: Feet unsupported Sitting balance-Leahy Scale: Poor Sitting balance - Comments: right lateral lean with fatigue Postural control: Right lateral lean Standing balance support: Bilateral upper extremity supported;During functional activity Standing balance-Leahy Scale: Zero                               Pertinent Vitals/Pain Pain Assessment: Faces Faces Pain Scale: No hurt    Home Living Family/patient expects to be discharged to:: Private residence Living  Arrangements: Children Available Help at Discharge: Family Type of Home: House Home Access: Stairs to enter   Entrance Stairs-Number of Steps: 1 Alternate Level Stairs-Number of Steps:  (flight) Home Layout: Two  level Home Equipment: Rolling Walker (2 wheels);BSC/3in1;Grab bars - tub/shower;Rollator (4 wheels);Tub bench Additional Comments: has w/c, but not her own    Prior Function Prior Level of Function : Needs assist             Mobility Comments: uses RW vs Rollator (RW downstairs, Rollator upstairs) ADLs Comments: assist for bathing, lower body dressing     Hand Dominance   Dominant Hand: Right    Extremity/Trunk Assessment   Upper Extremity Assessment Upper Extremity Assessment: Defer to OT evaluation    Lower Extremity Assessment Lower Extremity Assessment: Generalized weakness;Difficult to assess due to impaired cognition (ROM WFL)    Cervical / Trunk Assessment Cervical / Trunk Assessment: Kyphotic  Communication   Communication: Expressive difficulties  Cognition Arousal/Alertness: Lethargic Behavior During Therapy: Flat affect Overall Cognitive Status: Impaired/Different from baseline Area of Impairment: Orientation;Attention;Memory;Following commands;Safety/judgement;Awareness;Problem solving                 Orientation Level: Disoriented to;Time;Situation Current Attention Level: Focused Memory: Decreased short-term memory Following Commands: Follows one step commands inconsistently Safety/Judgement: Decreased awareness of deficits;Decreased awareness of safety Awareness: Intellectual Problem Solving: Requires verbal cues;Decreased initiation General Comments: Pt oriented to self and place, not time (stating it was June 22, and unable to correctly state December with cue for "Christmas"). Pt able to correctly identify daughter in room. Often times slow to respond and follows 1 step commands inconsistently. Poor attention to tasks. Verbalizing she is cold.        General Comments      Exercises     Assessment/Plan    PT Assessment Patient needs continued PT services  PT Problem List Decreased strength;Decreased activity tolerance;Decreased  mobility;Decreased balance;Decreased cognition;Decreased safety awareness       PT Treatment Interventions DME instruction;Gait training;Functional mobility training;Therapeutic activities;Therapeutic exercise;Balance training;Patient/family education    PT Goals (Current goals can be found in the Care Plan section)  Acute Rehab PT Goals Patient Stated Goal: unable to state PT Goal Formulation: Patient unable to participate in goal setting Time For Goal Achievement: 03/02/21 Potential to Achieve Goals: Fair    Frequency Min 3X/week   Barriers to discharge        Co-evaluation PT/OT/SLP Co-Evaluation/Treatment: Yes Reason for Co-Treatment: Necessary to address cognition/behavior during functional activity;For patient/therapist safety;To address functional/ADL transfers PT goals addressed during session: Mobility/safety with mobility         AM-PAC PT "6 Clicks" Mobility  Outcome Measure Help needed turning from your back to your side while in a flat bed without using bedrails?: Total Help needed moving from lying on your back to sitting on the side of a flat bed without using bedrails?: Total Help needed moving to and from a bed to a chair (including a wheelchair)?: Total Help needed standing up from a chair using your arms (e.g., wheelchair or bedside chair)?: Total Help needed to walk in hospital room?: Total Help needed climbing 3-5 steps with a railing? : Total 6 Click Score: 6    End of Session Equipment Utilized During Treatment: Gait belt;Oxygen Activity Tolerance: Patient limited by fatigue Patient left: in bed;with call bell/phone within reach;with family/visitor present Nurse Communication: Mobility status PT Visit Diagnosis: Muscle weakness (generalized) (M62.81);History of falling (Z91.81)    Time: 0092-3300 PT Time Calculation (min) (ACUTE  ONLY): 24 min   Charges:   PT Evaluation $PT Eval Moderate Complexity: 1 Mod          Wyona Almas, [place],  DPT Acute Rehabilitation Services Pager [phone] Office [phone]   Deno Etienne 02/16/2021, 1:09 PM

## 2021-02-16 NOTE — ED Notes (Signed)
Pt agitation increasing rapidly, Dr. Benny Lennert notified, new orders received.  Pt pulled IV out, will not leaver O2 in place and pulling at linens and clothes.

## 2021-02-16 NOTE — Progress Notes (Addendum)
STROKE TEAM PROGRESS NOTE   INTERVAL HISTORY Patient seen in ER room. Appears hemodynamically stable/ She is confused, which did limit her neuro exam. Right side is stronger than left side and she appears to have a right gaze preference.  MRI scan of brain is suboptimal due to excessive motion artifact but appears to show small bilateral parietal, right frontal and internal capsule infarcts in multiple vascular territories.  CT angiogram shows complete right ICA occlusion along with severe focal stenosis of proximal left ICA and poor contrast visualization and distal anterior cerebral and middle cerebral's limiting evaluation. Vitals:   02/16/21 0715 02/16/21 0800 02/16/21 0804 02/16/21 0830  BP: (!) 164/86 127/70  (!) 130/59  Pulse: 96 88 88 84  Resp: (!) 27 17 (!) 30 (!) 21  Temp:      TempSrc:      SpO2: 94%  93% 97%  Weight:      Height:       CBC:  Recent Labs  Lab 02/15/21 1439  WBC 6.9  NEUTROABS 5.0  HGB 10.8*  HCT 34.1*  MCV 107.2*  PLT 097   Basic Metabolic Panel:  Recent Labs  Lab 02/15/21 1439  NA 133*  K 4.2  CL 99  CO2 26  GLUCOSE 136*  BUN 20  CREATININE 0.90  CALCIUM 9.2   Lipid Panel:  Recent Labs  Lab 02/16/21 0239  CHOL 87  TRIG 58  HDL 34*  CHOLHDL 2.6  VLDL 12  LDLCALC 41   HgbA1c:  Recent Labs  Lab 02/16/21 0239  HGBA1C 6.5*   Urine Drug Screen: No results for input(s): LABOPIA, COCAINSCRNUR, LABBENZ, AMPHETMU, THCU, LABBARB in the last 168 hours.  Alcohol Level No results for input(s): ETH in the last 168 hours.  IMAGING past 24 hours CT ANGIO HEAD NECK W WO CM  Result Date: 02/15/2021 CLINICAL DATA:  Stroke, follow-up EXAM: CT ANGIOGRAPHY HEAD AND NECK TECHNIQUE: Multidetector CT imaging of the head and neck was performed using the standard protocol during bolus administration of intravenous contrast. Multiplanar CT image reconstructions and MIPs were obtained to evaluate the vascular anatomy. Carotid stenosis measurements (when  applicable) are obtained utilizing NASCET criteria, using the distal internal carotid diameter as the denominator. CONTRAST:  60mL OMNIPAQUE IOHEXOL 350 MG/ML SOLN COMPARISON:  No prior CTA, correlation is made with CT head 02/15/2021 and MRI head 02/15/2021 FINDINGS: CT HEAD FINDINGS For noncontrast findings, please see same day CT head. CTA NECK FINDINGS Aortic arch: Two-vessel arch with a common origin of the brachiocephalic and left common carotid arteries. Imaged portion shows no evidence of aneurysm or dissection. Moderate to severe calcified and noncalcified aortic atherosclerosis, which extends into the branch vessels, without significant stenosis Right carotid system: The right common carotid artery demonstrates multifocal calcification, which is not hemodynamically significant, until the distal CCA/bifurcation, where it appears occlusive (series 8, images 190 3-196). Minimal flow is seen in the proximal right ICA (series 8, image 186), but no flow is seen in the mid or distal right ICA. The right ECA appears patent. Left carotid system: Calcifications in the left CCA, which are not hemodynamically significant. At the bifurcation, there is severe calcified narrowing, with near complete occlusion (series 8, image 188) in the proximal to mid left ICA, with distal reconstitution, although the vessel is diminutive and flow is minimal (series 8, image 144). Vertebral arteries: Mild calcifications at the origin of the left vertebral artery, which appears otherwise patent. The left vertebral artery is patent with  scattered calcifications in the V1 and V2 segments. No dissection, significant stenosis, or occlusion. Skeleton: Degenerative changes in the cervical spine. No acute osseous abnormality. Other neck: Negative. Upper chest: Emphysema. Interlobular septal thickening and ground-glass opacities, concerning for pulmonary edema. Review of the MIP images confirms the above findings CTA HEAD FINDINGS Anterior  circulation: No opacification of the right petrous and cavernous ICA, with distal reconstitution in the supraclinoid right ICA, likely retrograde. Moderate to severe calcifications are noted in the cavernous segments bilaterally. Minimal flow in the petrous and cavernous left ICA. Poor opacification of the left-greater-than-right A1 segment. Minimal contrast is seen in the distal ACAs, which limits evaluation. Mild narrowing in the right A1 segment. No focal stenosis in the left A1. Poor opacification of the right-greater-than-left distal MCA branches, which limits evaluation. Posterior circulation: The bilateral vertebral arteries are patent to the vertebrobasilar junction. Mild calcifications are noted in the left-greater-than-right V4 segment. The bilateral picas are patent. The basilar artery is patent. The superior cerebellar arteries are visualized. The bilateral PCAs are patent. The right posterior communicating artery is not visualized. A diminutive left posterior communicating artery is suspected. Venous sinuses: Unable to evaluate. Anatomic variants: None significant Review of the MIP images confirms the above findings IMPRESSION: 1. Complete occlusion of the right ICA, just distal to the bifurcation, which extends through the petrous and cavernous intracranial ICA segments, with reconstitution in the right supraclinoid ICA, likely retrograde. This is of indeterminate acuity, likely chronic. 2. Severe focal stenosis in the proximal left ICA, with minimal flow in the distal left extracranial ICA and petrous and cavernous intracranial left ICA, also likely chronic. 3. Minimal contrast is seen in the distal ACAs and MCAs, which limits evaluation. 4. Grossly patent posterior circulation. 5. Emphysema and interlobular septal thickening in the imaged lungs, with ground-glass opacities, concerning for pulmonary edema. Correlate with BNP. 6. Severe atherosclerotic disease in the imaged aorta, extending into the  branch vessels, without significant proximal stenosis. These results were called by telephone at the time of interpretation on 02/15/2021 at 11:53m to provider [employer] , who verbally acknowledged these results. Electronically Signed   By: Merilyn Baba M.D.   On: 02/15/2021 23:43   DG Chest 1 View  Result Date: 02/15/2021 CLINICAL DATA:  Fall. EXAM: CHEST  1 VIEW COMPARISON:  Chest x-ray 10/18/2020. FINDINGS: There are atherosclerotic calcifications of the aorta. The cardiac silhouette is mildly enlarged, unchanged. There are patchy airspace opacities in the left lower lung. There is atelectasis in the right mid lung. There is no pleural effusion or pneumothorax. No acute fractures are seen. IMPRESSION: 1. Patchy left lower lobe airspace disease worrisome for infection. 2. Stable mild cardiomegaly. Electronically Signed   By: Ronney Asters M.D.   On: 02/15/2021 15:35   CT Head Wo Contrast  Result Date: 02/15/2021 CLINICAL DATA:  Head trauma, minor (Age >= 65y) Neuro deficit, acute, stroke suspected EXAM: CT HEAD WITHOUT CONTRAST TECHNIQUE: Contiguous axial images were obtained from the base of the skull through the vertex without intravenous contrast. COMPARISON:  Head CT 10/17/2020 FINDINGS: Brain: No evidence of acute intracranial hemorrhage or extra-axial collection.No evidence of mass lesion/concern mass effect.The ventricles are normal in size.Scattered subcortical and periventricular white matter hypodensities, nonspecific but likely sequela of chronic small vessel ischemic disease. Vascular: No hyperdense vessel or unexpected calcification. Skull: Normal. Negative for fracture or focal lesion. Sinuses/Orbits: No acute finding. Other: None. IMPRESSION: No acute intracranial abnormality. Unchanged sequela of chronic small vessel ischemic disease. Electronically  Signed   By: Maurine Simmering M.D.   On: 02/15/2021 15:19   CT Chest Wo Contrast  Result Date: 02/15/2021 CLINICAL DATA:  Patchy  airspace disease in the left base on recent chest x-ray EXAM: CT CHEST WITHOUT CONTRAST TECHNIQUE: Multidetector CT imaging of the chest was performed following the standard protocol without IV contrast. COMPARISON:  Chest x-ray from earlier in the same day, CT from 12/23/2019 FINDINGS: Cardiovascular: Limited due to lack of IV contrast. Diffuse atherosclerotic calcifications of the thoracic aorta are noted. No aneurysmal dilatation is seen. No cardiac enlargement is noted. Coronary calcifications are seen. No pericardial effusion is noted. Mediastinum/Nodes: Thoracic inlet shows no acute abnormality. No sizable hilar or mediastinal adenopathy is noted. The esophagus as visualized is within normal limits. Lungs/Pleura: Emphysematous changes are seen bilaterally. Left basilar consolidation is noted consistent with early infiltrate. No associated effusion is noted. No sizable parenchymal nodules are seen. Mild atelectatic changes are noted in the right lower lobe. Upper Abdomen: Gallbladder has been surgically removed. The remainder of the upper abdomen appears within normal limits. Musculoskeletal: Degenerative changes of the thoracic spine are noted. No acute rib abnormality is seen. Chronic L1 compression deformity is noted. IMPRESSION: Patchy consolidation in the left lower lobe similar to that seen on recent chest x-ray consistent with acute infiltrate. Mild basilar atelectasis on the right. Aortic Atherosclerosis (ICD10-I70.0) and Emphysema (ICD10-J43.9). Electronically Signed   By: Inez Catalina M.D.   On: 02/15/2021 17:43   MR Brain Wo Contrast (neuro protocol)  Result Date: 02/15/2021 CLINICAL DATA:  Stroke suspected, neuro deficit EXAM: MRI HEAD WITHOUT CONTRAST TECHNIQUE: Multiplanar, multiecho pulse sequences of the brain and surrounding structures were obtained without intravenous contrast. COMPARISON:  No prior MRI, correlation is made with CT head 02/15/2021 FINDINGS: Evaluation is limited by motion  artifact, which renders several sequences nondiagnostic. In addition the patient could not complete the exam. Brain: Small foci of restricted diffusion in the right anterior frontal lobe (series 5, image 79), right anterior parietal lobe (series 5, image 82-83), left parietal lobe (series 5, image 75), and right internal capsule (series 5, image 72), with additional possible punctate foci of restricted diffusion in the anterior left parietal lobe (series 5, image 83). No definite acute hemorrhage, mass, mass effect, or midline shift, although remaining sequences are significantly limited by motion artifact. T2 hyperintense signal in the periventricular white matter, likely the sequela of chronic small vessel ischemic disease. Vascular: Normal flow voids. Skull and upper cervical spine: No definite marrow signal abnormality. Sinuses/Orbits: Negative.  Status post bilateral lens replacements. Other: The mastoids are well aerated. IMPRESSION: Evaluation is limited by motion artifact, which renders several sequences nondiagnostic. Within this limitation, scattered small foci of restricted diffusion in the bilateral parietal lobes, right frontal lobe, and right internal capsule, most likely acute and/or infarcts. Given multiple vascular territories, an embolic etiology is suspected. Electronically Signed   By: Merilyn Baba M.D.   On: 02/15/2021 20:30   DG HIP UNILAT WITH PELVIS 2-3 VIEWS LEFT  Result Date: 02/15/2021 CLINICAL DATA:  Four falls this week.  Bilateral hip pain. EXAM: DG HIP (WITH OR WITHOUT PELVIS) 2-3V LEFT COMPARISON:  None. FINDINGS: No fracture or bone lesion. Hip joint normally spaced and aligned. Minor marginal osteophytes from the base of the superior femoral head. No other degenerative/arthropathic change. Dense atherosclerotic calcification along the iliac and femoral arteries. IMPRESSION: 1. No fracture or dislocation.  No acute finding. Electronically Signed   By: Dedra Skeens.D.  On:  02/15/2021 16:13   DG HIP UNILAT WITH PELVIS 2-3 VIEWS RIGHT  Result Date: 02/15/2021 CLINICAL DATA:  Four falls this week.  Pain. EXAM: DG HIP (WITH OR WITHOUT PELVIS) 2-3V RIGHT COMPARISON:  None. FINDINGS: Degenerative changes in the right hip without significant loss of joint space. No right hip fracture identified. Evaluation of the left hip is limited due to internal rotation but no fracture is seen. No other acute abnormalities. IMPRESSION: No right hip fracture identified. Evaluation left hip is limited as above. Electronically Signed   By: Dorise Bullion III M.D.   On: 02/15/2021 15:39    PHYSICAL EXAM  Temp:  [98.8 F (37.1 C)-99.4 F (37.4 C)] 98.8 F (37.1 C) (12/18 0552) Pulse Rate:  [61-96] 84 (12/18 0830) Resp:  [15-30] 21 (12/18 0830) BP: (95-179)/(57-109) 130/59 (12/18 0830) SpO2:  [91 %-100 %] 97 % (12/18 0830) Weight:  [64.3 kg] 64.3 kg (12/17 1322)  General -frail elderly African-American female not in distress Ophthalmologic - fundi not visualized due to noncooperation. Cardiovascular - Regular rhythm and rate.  Mental Status -  Patient was lethargic, difficulty staying awake during the exam. Attention span limited. Patient was able to say that she was in the hospital.  Speech is fluent with mild dysarthria only.  Diminished attention, registration and recall.  Disoriented to place and person.  Cranial Nerves II - XII - II - Possible left field cut, blinks to threat right stronger than left III, IV, VI - Extraocular movements intact. Right gaze preference.  Can cross midline when looking to the left but not all the way V - Facial sensation intact bilaterally. VII - Facial movement intact bilaterally. VIII - Hearing & vestibular intact bilaterally. X - Palate elevates symmetrically. XI - Chin turning & shoulder shrug intact bilaterally. XII - Tongue protrusion intact.  Motor Strength - Trace muscle movement with left hand grasp. Left arm 2/5 with drift, Left  leg 2/5 but able to wiggle toes.  Withdraws left leg briskly to pain.  Right side seems to be at baseline strength with mild drift noted in right extremities. .  Bulk was normal and fasciculations were absent.   Motor Tone - Muscle tone was assessed at the neck and appendages and was normal. Reflexes - The patients reflexes were symmetrical in all extremities and she had no pathological reflexes. Sensory - Light touch, temperature/pinprick were assessed and were symmetrical.   Coordination - deferred due to weakness Gait and Station - deferred.   ASSESSMENT/PLAN Ms. CIMONE FAHEY is a 87 y.o. female with history of CAD s/p NSTEMI, chronic diastolic heart failure, COPD with chronic hypoxemia on 2 L, hypertension, PVD, GERD, anxiety and depression who presents with concerns of recurrent fall and left-sided weakness. Patient has fallen 4 times in the last week. She reported pain in her legs as the cause of her falls and she is seen by cardiology for PVD. On 12/17 her family found her slumped over in her chair, unable to move her left arms and legs. They also noted slurred speech. She is currently compliant with her ASA and Plavix for CAD.   Biberebral strokes:  scattered infarcts in bilateral parietal lobe, right frontal lobe, right internal capsule likely secondary to combination of large vessel disease and small vessel disease- severe stenosis of L ICA and complete occlusion of R ICA;     Code Stroke -CT head -No acute abnormality.  CTA head & neck - complete occlusion of the R ICA, likely  chronic. Stenosis of L ICA, severe atherosclerotic disease in the aorta MRI  scattered small foci of restricted diffusion in the bilateral parietal lobes, right frontal lobe, and right internal capsule, most likely acute and/or infarcts. 2D Echo- Pending LDL 41 HgbA1c 6.5 VTE prophylaxis - Lovenox aspirin 81 mg daily and clopidogrel 75 mg daily prior to admission, now on aspirin 81 mg daily and clopidogrel 75  mg daily.  Therapy recommendations:  Pending Disposition:  Pending  Hypertension Home meds:  Atenolol 25mg , Amlodipine 5mg  Stable Permissive hypertension (OK if < 220/120) but gradually normalize in 5-7 days Long-term BP goal normotensive  Hyperlipidemia Home meds:  Atorvastatin 80mg , resumed in hospital LDL 41, goal < 70 High intensity statin  Continue statin at discharge  Other Stroke Risk Factors Advanced Age >/= 15  Hx stroke/TIA Coronary artery disease On plavix and ASA Angina- Isosorbide mononitrate 120mg   Other Active Problems Acute on chronic hypoxemic respiratory failure secondary to community acquired pna Baseline 2LNC for COPD Currently on 3L Rocephin and Azithromycin- Managed by primary team Chronic diastolic HF Monitor intake and output Consider resuming spirolactone tomorrow Managed by primary team   Hospital day # 0  Patient seen and examined by NP/APP with MD. MD to update note as needed.   Janine Ores, DNP, FNP-BC [facility] Pager: [phone]   STROKE MD :  I have personally obtained history,examined this patient, reviewed notes, independently viewed imaging studies, participated in medical decision making and plan of care.ROS completed by me personally and pertinent positives fully documented  I have made any additions or clarifications directly to the above note. Agree with note above.  Patient presented with frequent falls and confusion and MRI scan is limited due to motion artifact but does show bilateral cortical and subcortical infarcts with CT angiogram showing chronic right carotid occlusion and high-grade stenosis at origin of left ICA.  Patient's neurological baseline appears to be "poor due to significant cardiac disease.  I had a long discussion with the patient daughter over the phone and family's not sure they would like to consider diagnostic cerebral catheter angiogram and cerebrovascular revascularization given her poor  baseline functioning and medical comorbidities.  They will discuss and let us know goals of care.  Recommend aspirin and Plavix and aggressive risk factor modification.  Discussed with Dr.Swayze.  Greater than 50% time during this 35-minute visit was spent on coordination of care and counseling about her multivessel occlusive cerebrovascular disease and bilateral strokes and answered questions  Antony Contras, MD Medical Director [facility] Pager: [phone] 02/16/2021 2:24 PM  To contact Stroke Continuity provider, please refer to [email or web address] After hours, contact General Neurology

## 2021-02-16 NOTE — ED Notes (Signed)
Patient too sleepy to do swallow screen at this time

## 2021-02-16 NOTE — Evaluation (Signed)
Occupational Therapy Evaluation Patient Details Name: Cindy Brandt MRN: [id number] DOB: 02-02-1932 Today's Date: 02/16/2021   History of Present Illness Pt is a 87 y.o. F who presents 02/15/2021 with concerns for recurrent falls and left sided weakness. 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 CTA head & neck - complete occlusion of the R ICA, likely chronic. Stenosis of L ICA, severe atherosclerotic disease in the aorta. Significant PMH: CAD s/p NSTEMI, chronic diastolic heart failure, COPD on 2L O2, HTN, PVD, anxiety and depression.   Clinical Impression   Pt presents with decreased balance, strength, and cognition. Currently requiring Total A for ADLs and functional transfers/mobility. Per pt's daughter, pt requiring some assistance with ADLs at baseline. During eval, pt with little volitional UE movement, however LUE appearing weaker than RUE. Pt also demonstrating R-sided gaze preference. Would likely benefit from SNF for further rehab prior to return home. Will follow acutely.      Recommendations for follow up therapy are one component of a multi-disciplinary discharge planning process, led by the attending physician.  Recommendations may be updated based on patient status, additional functional criteria and insurance authorization.   Follow Up Recommendations  Skilled nursing-short term rehab (<3 hours/day)    Assistance Recommended at Discharge Frequent or constant Supervision/Assistance  Functional Status Assessment  Patient has had a recent decline in their functional status and demonstrates the ability to make significant improvements in function in a reasonable and predictable amount of time.  Equipment Recommendations  Other (comment) (Defer)    Recommendations for Other Services       Precautions / Restrictions Precautions Precautions: Fall Restrictions Weight Bearing  Restrictions: No      Mobility Bed Mobility Overal bed mobility: Needs Assistance Bed Mobility: Supine to Sit;Sit to Supine     Supine to sit: Max assist;+2 for physical assistance Sit to supine: Max assist;+2 for physical assistance   General bed mobility comments: Decreased initiation by pt, maxA + 2 for supine <> sit from stretcher    Transfers Overall transfer level: Needs assistance Equipment used: None Transfers: Sit to/from Stand Sit to Stand: Max assist;+2 safety/equipment           General transfer comment: Face to face transfer with maxA, pt unable to accept significant weight through legs      Balance Overall balance assessment: Needs assistance Sitting-balance support: Feet unsupported Sitting balance-Leahy Scale: Poor Sitting balance - Comments: right lateral lean with fatigue Postural control: Right lateral lean Standing balance support: Bilateral upper extremity supported;During functional activity Standing balance-Leahy Scale: Zero                             ADL either performed or assessed with clinical judgement   ADL Overall ADL's : Needs assistance/impaired Eating/Feeding: Sitting;Total assistance   Grooming: Total assistance   Upper Body Bathing: Total assistance   Lower Body Bathing: Total assistance   Upper Body Dressing : Total assistance   Lower Body Dressing: Total assistance       Toileting- Clothing Manipulation and Hygiene: Total assistance               Vision         Perception     Praxis      Pertinent Vitals/Pain Pain Assessment: Faces Faces Pain Scale: No hurt     Hand Dominance Right   Extremity/Trunk Assessment Upper  Extremity Assessment Upper Extremity Assessment: Difficult to assess due to impaired cognition;Generalized weakness (ROM appears WFL, LUE appears weaker than RUE.)   Lower Extremity Assessment Lower Extremity Assessment: Defer to PT evaluation   Cervical / Trunk  Assessment Cervical / Trunk Assessment: Kyphotic   Communication Communication Communication: Expressive difficulties   Cognition Arousal/Alertness: Lethargic Behavior During Therapy: Flat affect Overall Cognitive Status: Impaired/Different from baseline Area of Impairment: Orientation;Attention;Memory;Following commands;Safety/judgement;Awareness;Problem solving                 Orientation Level: Disoriented to;Time;Situation Current Attention Level: Focused Memory: Decreased short-term memory Following Commands: Follows one step commands inconsistently Safety/Judgement: Decreased awareness of deficits;Decreased awareness of safety Awareness: Intellectual Problem Solving: Requires verbal cues;Decreased initiation General Comments: Pt oriented to self and place, not time (stating it was June 22, and unable to correctly state December with cue for "Christmas"). Pt able to correctly identify daughter in room. Often times slow to respond and follows 1 step commands inconsistently. Poor attention to tasks. Verbalizing she is cold.     General Comments       Exercises     Shoulder Instructions      Home Living Family/patient expects to be discharged to:: Private residence Living Arrangements: Children Available Help at Discharge: Family Type of Home: House Home Access: Stairs to enter Technical brewer of Steps: 1   Home Layout: Two level Alternate Level Stairs-Number of Steps:  (flight)   Bathroom Shower/Tub: Tub/shower unit         Home Equipment: Conservation officer, nature (2 wheels);BSC/3in1;Grab bars - tub/shower;Rollator (4 wheels);Tub bench   Additional Comments: has w/c, but not her own      Prior Functioning/Environment Prior Level of Function : Needs assist             Mobility Comments: uses RW vs Rollator (RW downstairs, Rollator upstairs) ADLs Comments: assist for bathing, lower body dressing        OT Problem List: Decreased strength;Impaired  balance (sitting and/or standing);Decreased cognition;Decreased safety awareness;Decreased knowledge of use of DME or AE;Decreased knowledge of precautions;Impaired UE functional use;Decreased coordination      OT Treatment/Interventions: Self-care/ADL training;Therapeutic exercise;Neuromuscular education;DME and/or AE instruction;Therapeutic activities;Cognitive remediation/compensation;Patient/family education;Balance training    OT Goals(Current goals can be found in the care plan section) Acute Rehab OT Goals Patient Stated Goal: unable to state OT Goal Formulation: Patient unable to participate in goal setting Time For Goal Achievement: 03/02/21 Potential to Achieve Goals: Fair  OT Frequency: Min 2X/week   Barriers to D/C:            Co-evaluation PT/OT/SLP Co-Evaluation/Treatment: Yes Reason for Co-Treatment: Necessary to address cognition/behavior during functional activity;For patient/therapist safety;To address functional/ADL transfers PT goals addressed during session: Mobility/safety with mobility OT goals addressed during session: ADL's and self-care;Strengthening/ROM      AM-PAC OT "6 Clicks" Daily Activity     Outcome Measure Help from another person eating meals?: Total Help from another person taking care of personal grooming?: Total Help from another person toileting, which includes using toliet, bedpan, or urinal?: Total Help from another person bathing (including washing, rinsing, drying)?: Total Help from another person to put on and taking off regular upper body clothing?: Total Help from another person to put on and taking off regular lower body clothing?: Total 6 Click Score: 6   End of Session Equipment Utilized During Treatment: Gait belt;Rolling walker (2 wheels);Oxygen Nurse Communication: Mobility status  Activity Tolerance: Patient limited by lethargy Patient left: in bed;with call bell/phone  within reach;with family/visitor present  OT Visit  Diagnosis: Unsteadiness on feet (R26.81);Repeated falls (R29.6);Muscle weakness (generalized) (M62.81);Other symptoms and signs involving cognitive function                Time: 4496-7591 OT Time Calculation (min): 25 min Charges:  OT General Charges $OT Visit: 1 Visit OT Evaluation $OT Eval Moderate Complexity: 1 Mod  Rivky Clendenning C, OT/L  Acute Re[facility] 02/16/2021, 2:02 PM

## 2021-02-16 NOTE — H&P (Signed)
History and Physical    Cindy Brandt:[id number] DOB: 1931/04/12 DOA: 02/15/2021  PCP: [name], NP  Patient coming from: Home  I have personally briefly reviewed patient's old medical records in [place]  Chief Complaint: Recurrent fall and left-sided weakness  HPI: Cindy Brandt is a 87 y.o. female with medical history significant for CAD s/p NSTEMI, chronic diastolic heart failure, COPD with chronic hypoxemia on 2 L, hypertension, GERD, anxiety and depression who presents with concerns of recurrent fall and left-sided weakness.  Daughter at bedside provides history as patient was confused.  She reports that in the past week patient has fallen 4 times.  Patient complains of pain to her legs causing a fall which has been evaluated by cardiology before in the likely due to peripheral vascular disease.  Earlier today, she was found by family to be slumped over beside her chair and likely had fallen out of it.  When they attempted to pick her up felt like she was not moving her left arms or legs.  Also noted slurred speech prompting them to bring her here to the ER. Patient does not have history of previous stroke.  She is compliant on aspirin and Plavix for her CAD.  No prior history of atrial fibrillation.  ED Course: She was afebrile, mildly hypertensive BP of 165/75 on 3 L via nasal cannula. No leukocytosis, hemoglobin 10.8 Sodium of 133, K4.2, creatinine of 0.9, BG of 136 BNP of 262 CT chest showing left lower lobe infiltrate concerning for pneumonia Right hip x-ray was negative  Her symptoms largely resolved by the time she arrived in the ED. Neurology was initially called for concerns of TIA and MRI was recommended before considering further stroke work-up.  MRI brain later resulted with scattered small foci of restricted diffusion in the bilateral parietal lobe, right frontal lobe and right internal capsule likely embolic source. Neurology then contacted for  full consultation.   Review of Systems: Unable to obtain due to patient's altered mental status  Past Medical History:  Diagnosis Date   Acute respiratory failure with hypoxia ([place]) 12/23/2019   Allergy    Anemia    Anxiety    Arthritis    Blood transfusion without reported diagnosis    Cataract    a. bilerteral cataracts removed   COPD (chronic obstructive pulmonary disease) ([place])    Coronary atherosclerosis of native coronary artery    a. 2003 Cath/unsuccessful PCI of occluded RCA;  b. 2005 NSTEMI/Cath: CTO RCA w/ L->R collats, LAD 20, LCX 19m, nl EF;  c. 11/2011 Myoview: no ischemia.   Diastolic dysfunction 93/81/0175   a. 11/2011 Echo: Ejection fraction 60-65%, gr1 DD, basal inferior AK;  b. 09/2013 Echo: EF 55-60%, mild LVH, no rwma, Gr1 DD, mildly dil LA.   Gastritis    a. 04/2016 EGD: Nl esophagus, gastritis, nl duodenal bulb & 2nd portion of the duodenum.   Headache(784.0)    Hyperlipidemia    Hypertension    Hypokalemia    Pneumonia of both lungs due to infectious organism 12/23/2019   Sepsis ([place]) 12/23/2019   Tubular adenoma of colon 2013    Past Surgical History:  Procedure Laterality Date   CARPAL TUNNEL RELEASE Bilateral    CHOLECYSTECTOMY  2004   COLONOSCOPY  08/06/2011   Procedure: COLONOSCOPY;  Surgeon: [name], MD;  Location: [facility];  Service: Endoscopy;  Laterality: N/A;  10:40 AM   CORONARY ANGIOGRAPHY N/A 08/02/2017   Procedure: CORONARY  ANGIOGRAPHY;  Surgeon: Lorretta Harp, MD;  Location: [place] [facility];  Service: Cardiovascular;  Laterality: N/A;   GALLBLADDER SURGERY     LEFT HEART CATH AND CORONARY ANGIOGRAPHY N/A 06/15/2016   Procedure: Left Heart Cath and Coronary Angiography;  Surgeon: Lorretta Harp, MD;  Location: [place] [facility];  Service: Cardiovascular;  Laterality: N/A;   PARTIAL HYSTERECTOMY     repair of belly button     UMBILICAL HERNIA REPAIR     Umbilical hernia repair as a child     reports that she quit smoking  about 24 years ago. Her smoking use included cigarettes. She has a 12.50 pack-year smoking history. She has never used smokeless tobacco. She reports that she does not drink alcohol and does not use drugs. Social History  Allergies  Allergen Reactions   Ranexa [Ranolazine] Other (See Comments)    Does NOT "agree" with the patient- does not feel like herself    Family History  Problem Relation Age of Onset   Coronary artery disease Father    Heart attack Father    Colon cancer Father    Coronary artery disease Mother    Stomach cancer Mother    Heart disease Brother    Diabetes Brother    Pancreatic cancer Neg Hx    Rectal cancer Neg Hx    Esophageal cancer Neg Hx      Prior to Admission medications   Medication Sig Start Date End Date Taking? Authorizing Provider  amLODipine (NORVASC) 5 MG tablet Take 1 tablet (5 mg total) by mouth daily. 12/10/20  Yes Lorretta Harp, MD  aspirin EC 81 MG tablet Take 81 mg by mouth every morning.   Yes [provider]  atenolol (TENORMIN) 25 MG tablet TAKE 1 TABLET BY MOUTH EVERY DAY Patient taking differently: Take 25 mg by mouth daily. 12/23/20  Yes Lorretta Harp, MD  atorvastatin (LIPITOR) 80 MG tablet TAKE 1 TABLET BY MOUTH EVERY DAY IN THE EVENING FOR CHOLESTEROL Patient taking differently: Take 80 mg by mouth daily. 08/29/20  Yes [name], NP  calcium carbonate (OS-CAL) 600 MG TABS tablet Take 1,200 mg by mouth daily.   Yes [provider]  cetirizine (ZYRTEC) 10 MG tablet Take 10 mg by mouth daily.   Yes [provider]  clopidogrel (PLAVIX) 75 MG tablet NEW PRESCRIPTION REQUEST: TAKE ONE TABLET BY MOUTH DAILY Patient taking differently: Take 75 mg by mouth daily. 12/03/20  Yes Lorretta Harp, MD  famotidine (PEPCID) 20 MG tablet Take 20 mg by mouth daily.    Yes [provider]  fluticasone (FLONASE) 50 MCG/ACT nasal spray PLACE 1 SPRAY INTO BOTH NOSTRILS 2 (TWO) TIMES DAILY Patient  taking differently: Place 1 spray into both nostrils 2 (two) times daily as needed for allergies. 03/27/20  Yes [name], NP  Fluticasone-Umeclidin-Vilant (TRELEGY ELLIPTA) 100-62.5-25 MCG/INH AEPB Inhale 1 puff into the lungs daily. 02/26/20  Yes Icard, Bradley L, DO  gabapentin (NEURONTIN) 100 MG capsule Take 1 capsule (100 mg total) by mouth at bedtime as needed. For pain. Patient taking differently: Take 100 mg by mouth at bedtime as needed (pain). 11/01/20  Yes [name], NP  isosorbide mononitrate (IMDUR) 120 MG 24 hr tablet Take 1 tablet (120 mg total) by mouth in the morning. 10/24/20  Yes Cleaver, Jossie Ng, NP  Melatonin 5 MG TABS Take 5 mg by mouth at bedtime as needed (sleep).    Yes [provider]  nitroGLYCERIN (NITROSTAT) 0.4 MG SL tablet PLACE 1 TABLET UNDER THE TONGUE EVERY 5 MIN AS NEEDED FOR CHEST PAIN Patient taking differently: Place 0.4 mg under the tongue every 5 (five) minutes as needed for chest pain. 10/03/20  Yes Lorretta Harp, MD  pantoprazole (PROTONIX) 40 MG tablet TAKE 1 TABLET BY MOUTH ONCE DAILY FOR HEARTBURN. Patient taking differently: Take 40 mg by mouth daily. 04/09/20  Yes [name], NP  PROAIR HFA 108 [phone] Base) MCG/ACT inhaler NEW PRESCRIPTION REQUEST: INHALE ONE PUFF BY MOUTH EVERY FOUR HOURS AS NEEDED Patient taking differently: Inhale 1 puff into the lungs every 4 (four) hours as needed for wheezing or shortness of breath. 01/19/20  Yes [name], NP  sertraline (ZOLOFT) 100 MG tablet TAKE 1 TABLET BY MOUTH ONCE DAILY FOR DEPRESSION/ANXIETY. Patient taking differently: Take 100 mg by mouth at bedtime. 09/04/20  Yes [name], NP  solifenacin (VESICARE) 5 MG tablet TAKE 1 TABLET (5 MG TOTAL) BY MOUTH DAILY. FOR OVERACTIVE BLADDER. Patient taking differently: Take 5 mg by mouth daily. 04/09/20  Yes [name], NP  spironolactone (ALDACTONE) 25 MG tablet TAKE 1/2 TABLET BY MOUTH EVERY DAY Patient taking  differently: Take 12.5 mg by mouth daily. 09/04/20  Yes Lorretta Harp, MD    Physical Exam: Vitals:   02/15/21 2019 02/15/21 2203 02/15/21 2245 02/16/21 0147  BP:  (!) 167/86 103/68 (!) 179/96  Pulse:  92 90 80  Resp:  (!) 22 20 (!) 22  Temp:   99.4 F (37.4 C)   TempSrc:   Rectal   SpO2: 100% 100% 94% 97%  Weight:      Height:        Constitutional: NAD, calm, comfortable, confused elderly female laying in bed Vitals:   02/15/21 2019 02/15/21 2203 02/15/21 2245 02/16/21 0147  BP:  (!) 167/86 103/68 (!) 179/96  Pulse:  92 90 80  Resp:  (!) 22 20 (!) 22  Temp:   99.4 F (37.4 C)   TempSrc:   Rectal   SpO2: 100% 100% 94% 97%  Weight:      Height:       Eyes: lids and conjunctivae normal ENMT: Mucous membranes are moist.  Neck: normal, supple Respiratory: clear to auscultation bilaterally, no wheezing, no crackles. Normal respiratory effort on 3L. No accessory muscle use.  Cardiovascular: Regular rate and rhythm, no murmurs / rubs / gallops. No extremity edema. Abdomen: no tenderness, no masses palpated.  Bowel sounds positive.  Musculoskeletal: no clubbing / cyanosis. No joint deformity upper and lower extremities. Good ROM, no contractures. Normal muscle tone.  Skin: no rashes, lesions, ulcers. No induration Neurologic: CN 2-12 grossly intact. Alert and awoke easily but had difficulty answer questions. She was able to follow commands. Had slight weaker left hand grip. Able to lift bilateral LE but not able to flex knee to do heel to shin.   Psychiatric: confusion.     Labs on Admission: I have personally reviewed following labs and imaging studies  CBC: Recent Labs  Lab 02/15/21 1439  WBC 6.9  NEUTROABS 5.0  HGB 10.8*  HCT 34.1*  MCV 107.2*  PLT 621   Basic Metabolic Panel: Recent Labs  Lab 02/15/21 1439  NA 133*  K 4.2  CL 99  CO2 26  GLUCOSE 136*  BUN 20  CREATININE 0.90  CALCIUM 9.2   GFR: Estimated Creatinine Clearance: 39.6 mL/min (by C-G  formula based on SCr of  0.9 mg/dL). Liver Function Tests: Recent Labs  Lab 02/15/21 1439  AST 29  ALT 17  ALKPHOS 43  BILITOT 0.7  PROT 6.8  ALBUMIN 3.0*   No results for input(s): LIPASE, AMYLASE in the last 168 hours. No results for input(s): AMMONIA in the last 168 hours. Coagulation Profile: No results for input(s): INR, PROTIME in the last 168 hours. Cardiac Enzymes: No results for input(s): CKTOTAL, CKMB, CKMBINDEX, TROPONINI in the last 168 hours. BNP (last 3 results) No results for input(s): PROBNP in the last 8760 hours. HbA1C: No results for input(s): HGBA1C in the last 72 hours. CBG: No results for input(s): GLUCAP in the last 168 hours. Lipid Profile: No results for input(s): CHOL, HDL, LDLCALC, TRIG, CHOLHDL, LDLDIRECT in the last 72 hours. Thyroid Function Tests: No results for input(s): TSH, T4TOTAL, FREET4, T3FREE, THYROIDAB in the last 72 hours. Anemia Panel: No results for input(s): VITAMINB12, FOLATE, FERRITIN, TIBC, IRON, RETICCTPCT in the last 72 hours. Urine analysis:    Component Value Date/Time   COLORURINE YELLOW 12/23/2019 2227   APPEARANCEUR CLEAR 12/23/2019 2227   LABSPEC >1.046 (H) 12/23/2019 2227   PHURINE 5.0 12/23/2019 2227   GLUCOSEU NEGATIVE 12/23/2019 2227   HGBUR MODERATE (A) 12/23/2019 2227   BILIRUBINUR NEGATIVE 12/23/2019 2227   BILIRUBINUR 1+ 04/11/2018 1436   KETONESUR 5 (A) 12/23/2019 2227   PROTEINUR 100 (A) 12/23/2019 2227   UROBILINOGEN 0.2 04/11/2018 1436   UROBILINOGEN 0.2 03/02/2008 1545   NITRITE NEGATIVE 12/23/2019 2227   LEUKOCYTESUR NEGATIVE 12/23/2019 2227    Radiological Exams on Admission: CT ANGIO HEAD NECK W WO CM  Result Date: 02/15/2021 CLINICAL DATA:  Stroke, follow-up EXAM: CT ANGIOGRAPHY HEAD AND NECK TECHNIQUE: Multidetector CT imaging of the head and neck was performed using the standard protocol during bolus administration of intravenous contrast. Multiplanar CT image reconstructions and MIPs were  obtained to evaluate the vascular anatomy. Carotid stenosis measurements (when applicable) are obtained utilizing NASCET criteria, using the distal internal carotid diameter as the denominator. CONTRAST:  55mL OMNIPAQUE IOHEXOL 350 MG/ML SOLN COMPARISON:  No prior CTA, correlation is made with CT head 02/15/2021 and MRI head 02/15/2021 FINDINGS: CT HEAD FINDINGS For noncontrast findings, please see same day CT head. CTA NECK FINDINGS Aortic arch: Two-vessel arch with a common origin of the brachiocephalic and left common carotid arteries. Imaged portion shows no evidence of aneurysm or dissection. Moderate to severe calcified and noncalcified aortic atherosclerosis, which extends into the branch vessels, without significant stenosis Right carotid system: The right common carotid artery demonstrates multifocal calcification, which is not hemodynamically significant, until the distal CCA/bifurcation, where it appears occlusive (series 8, images 190 3-196). Minimal flow is seen in the proximal right ICA (series 8, image 186), but no flow is seen in the mid or distal right ICA. The right ECA appears patent. Left carotid system: Calcifications in the left CCA, which are not hemodynamically significant. At the bifurcation, there is severe calcified narrowing, with near complete occlusion (series 8, image 188) in the proximal to mid left ICA, with distal reconstitution, although the vessel is diminutive and flow is minimal (series 8, image 144). Vertebral arteries: Mild calcifications at the origin of the left vertebral artery, which appears otherwise patent. The left vertebral artery is patent with scattered calcifications in the V1 and V2 segments. No dissection, significant stenosis, or occlusion. Skeleton: Degenerative changes in the cervical spine. No acute osseous abnormality. Other neck: Negative. Upper chest: Emphysema. Interlobular septal thickening and ground-glass opacities, concerning  for pulmonary edema.  Review of the MIP images confirms the above findings CTA HEAD FINDINGS Anterior circulation: No opacification of the right petrous and cavernous ICA, with distal reconstitution in the supraclinoid right ICA, likely retrograde. Moderate to severe calcifications are noted in the cavernous segments bilaterally. Minimal flow in the petrous and cavernous left ICA. Poor opacification of the left-greater-than-right A1 segment. Minimal contrast is seen in the distal ACAs, which limits evaluation. Mild narrowing in the right A1 segment. No focal stenosis in the left A1. Poor opacification of the right-greater-than-left distal MCA branches, which limits evaluation. Posterior circulation: The bilateral vertebral arteries are patent to the vertebrobasilar junction. Mild calcifications are noted in the left-greater-than-right V4 segment. The bilateral picas are patent. The basilar artery is patent. The superior cerebellar arteries are visualized. The bilateral PCAs are patent. The right posterior communicating artery is not visualized. A diminutive left posterior communicating artery is suspected. Venous sinuses: Unable to evaluate. Anatomic variants: None significant Review of the MIP images confirms the above findings IMPRESSION: 1. Complete occlusion of the right ICA, just distal to the bifurcation, which extends through the petrous and cavernous intracranial ICA segments, with reconstitution in the right supraclinoid ICA, likely retrograde. This is of indeterminate acuity, likely chronic. 2. Severe focal stenosis in the proximal left ICA, with minimal flow in the distal left extracranial ICA and petrous and cavernous intracranial left ICA, also likely chronic. 3. Minimal contrast is seen in the distal ACAs and MCAs, which limits evaluation. 4. Grossly patent posterior circulation. 5. Emphysema and interlobular septal thickening in the imaged lungs, with ground-glass opacities, concerning for pulmonary edema. Correlate with  BNP. 6. Severe atherosclerotic disease in the imaged aorta, extending into the branch vessels, without significant proximal stenosis. These results were called by telephone at the time of interpretation on 02/15/2021 at 11:41m to provider [facility] , who verbally acknowledged these results. Electronically Signed   By: Merilyn Baba M.D.   On: 02/15/2021 23:43   DG Chest 1 View  Result Date: 02/15/2021 CLINICAL DATA:  Fall. EXAM: CHEST  1 VIEW COMPARISON:  Chest x-ray 10/18/2020. FINDINGS: There are atherosclerotic calcifications of the aorta. The cardiac silhouette is mildly enlarged, unchanged. There are patchy airspace opacities in the left lower lung. There is atelectasis in the right mid lung. There is no pleural effusion or pneumothorax. No acute fractures are seen. IMPRESSION: 1. Patchy left lower lobe airspace disease worrisome for infection. 2. Stable mild cardiomegaly. Electronically Signed   By: Ronney Asters M.D.   On: 02/15/2021 15:35   CT Head Wo Contrast  Result Date: 02/15/2021 CLINICAL DATA:  Head trauma, minor (Age >= 65y) Neuro deficit, acute, stroke suspected EXAM: CT HEAD WITHOUT CONTRAST TECHNIQUE: Contiguous axial images were obtained from the base of the skull through the vertex without intravenous contrast. COMPARISON:  Head CT 10/17/2020 FINDINGS: Brain: No evidence of acute intracranial hemorrhage or extra-axial collection.No evidence of mass lesion/concern mass effect.The ventricles are normal in size.Scattered subcortical and periventricular white matter hypodensities, nonspecific but likely sequela of chronic small vessel ischemic disease. Vascular: No hyperdense vessel or unexpected calcification. Skull: Normal. Negative for fracture or focal lesion. Sinuses/Orbits: No acute finding. Other: None. IMPRESSION: No acute intracranial abnormality. Unchanged sequela of chronic small vessel ischemic disease. Electronically Signed   By: Maurine Simmering M.D.   On: 02/15/2021 15:19    CT Chest Wo Contrast  Result Date: 02/15/2021 CLINICAL DATA:  Patchy airspace disease in the left base on recent chest x-ray  EXAM: CT CHEST WITHOUT CONTRAST TECHNIQUE: Multidetector CT imaging of the chest was performed following the standard protocol without IV contrast. COMPARISON:  Chest x-ray from earlier in the same day, CT from 12/23/2019 FINDINGS: Cardiovascular: Limited due to lack of IV contrast. Diffuse atherosclerotic calcifications of the thoracic aorta are noted. No aneurysmal dilatation is seen. No cardiac enlargement is noted. Coronary calcifications are seen. No pericardial effusion is noted. Mediastinum/Nodes: Thoracic inlet shows no acute abnormality. No sizable hilar or mediastinal adenopathy is noted. The esophagus as visualized is within normal limits. Lungs/Pleura: Emphysematous changes are seen bilaterally. Left basilar consolidation is noted consistent with early infiltrate. No associated effusion is noted. No sizable parenchymal nodules are seen. Mild atelectatic changes are noted in the right lower lobe. Upper Abdomen: Gallbladder has been surgically removed. The remainder of the upper abdomen appears within normal limits. Musculoskeletal: Degenerative changes of the thoracic spine are noted. No acute rib abnormality is seen. Chronic L1 compression deformity is noted. IMPRESSION: Patchy consolidation in the left lower lobe similar to that seen on recent chest x-ray consistent with acute infiltrate. Mild basilar atelectasis on the right. Aortic Atherosclerosis (ICD10-I70.0) and Emphysema (ICD10-J43.9). Electronically Signed   By: Inez Catalina M.D.   On: 02/15/2021 17:43   MR Brain Wo Contrast (neuro protocol)  Result Date: 02/15/2021 CLINICAL DATA:  Stroke suspected, neuro deficit EXAM: MRI HEAD WITHOUT CONTRAST TECHNIQUE: Multiplanar, multiecho pulse sequences of the brain and surrounding structures were obtained without intravenous contrast. COMPARISON:  No prior MRI,  correlation is made with CT head 02/15/2021 FINDINGS: Evaluation is limited by motion artifact, which renders several sequences nondiagnostic. In addition the patient could not complete the exam. Brain: Small foci of restricted diffusion in the right anterior frontal lobe (series 5, image 79), right anterior parietal lobe (series 5, image 82-83), left parietal lobe (series 5, image 75), and right internal capsule (series 5, image 72), with additional possible punctate foci of restricted diffusion in the anterior left parietal lobe (series 5, image 83). No definite acute hemorrhage, mass, mass effect, or midline shift, although remaining sequences are significantly limited by motion artifact. T2 hyperintense signal in the periventricular white matter, likely the sequela of chronic small vessel ischemic disease. Vascular: Normal flow voids. Skull and upper cervical spine: No definite marrow signal abnormality. Sinuses/Orbits: Negative.  Status post bilateral lens replacements. Other: The mastoids are well aerated. IMPRESSION: Evaluation is limited by motion artifact, which renders several sequences nondiagnostic. Within this limitation, scattered small foci of restricted diffusion in the bilateral parietal lobes, right frontal lobe, and right internal capsule, most likely acute and/or infarcts. Given multiple vascular territories, an embolic etiology is suspected. Electronically Signed   By: Merilyn Baba M.D.   On: 02/15/2021 20:30   DG HIP UNILAT WITH PELVIS 2-3 VIEWS LEFT  Result Date: 02/15/2021 CLINICAL DATA:  Four falls this week.  Bilateral hip pain. EXAM: DG HIP (WITH OR WITHOUT PELVIS) 2-3V LEFT COMPARISON:  None. FINDINGS: No fracture or bone lesion. Hip joint normally spaced and aligned. Minor marginal osteophytes from the base of the superior femoral head. No other degenerative/arthropathic change. Dense atherosclerotic calcification along the iliac and femoral arteries. IMPRESSION: 1. No fracture or  dislocation.  No acute finding. Electronically Signed   By: Lajean Manes M.D.   On: 02/15/2021 16:13   DG HIP UNILAT WITH PELVIS 2-3 VIEWS RIGHT  Result Date: 02/15/2021 CLINICAL DATA:  Four falls this week.  Pain. EXAM: DG HIP (WITH OR WITHOUT PELVIS) 2-3V RIGHT  COMPARISON:  None. FINDINGS: Degenerative changes in the right hip without significant loss of joint space. No right hip fracture identified. Evaluation of the left hip is limited due to internal rotation but no fracture is seen. No other acute abnormalities. IMPRESSION: No right hip fracture identified. Evaluation left hip is limited as above. Electronically Signed   By: Dorise Bullion III M.D.   On: 02/15/2021 15:39      Assessment/Plan  CVA -no IV tPA since outside of window - MRI brain showing scattered small foci of restricted diffusion in the bilateral parietal lobe, right frontal lobe and right internal capsule likely embolic source - CTA head and neck showed complete occlusion of the right RCA and severe focal stenosis in the proximal left ICA that neurology feels is likely chronic -Obtain echocardiogram  -Obtain A1c and lipids -PT/OT/SLT -Frequent neuro checks and keep on telemetry -Allow for permissive hypertension with blood pressure treatment as needed only if systolic goes above 573 -Continue home antiplatelet therapy with aspirin and Plavix -Continue atorvastatin -Neurology will continue to follow  Acute on chronic hypoxemic respiratory failure secondary to community-acquired pneumonia - Patient at baseline on 2 L for her COPD.  Requiring 3 L on admission - CT chest showing patchy consolidation in the left lower lobe consistent with acute infiltrate -continue IV Rocephin and Azithromycin   Chronic diastolic HF -BNP is unequivocal at 262 and she appears to be euvolemic on exam.  CTA head and neck noted possible opacity concerning for pulmonary edema. Will monitor her intake and output. -hold spirolactone  overnight while allowing for permissive HTN  CAD Continue aspirin and Plavix Continue Imdur  Hypertension Hold amlodipine, atenolol to allow for permissive hypertension  DVT prophylaxis:.Lovenox Code Status:Limited- CPR only. Verified MOST form at bedside with daughter Family Communication: Plan discussed with daughter at bedside  disposition Plan: Home with observation Consults called:  Admission status: Observation   Level of care: Telemetry Medical  Status is: Observation  The patient remains OBS appropriate and will d/c before 2 midnights.        Orene Desanctis DO [facility]   If 7PM-7AM, please contact night-coverage [email or web address]   02/16/2021, 1:54 AM

## 2021-02-16 NOTE — ED Notes (Signed)
Pt more calm at this time.  Daughters at bedside, will continue to monitor.

## 2021-02-16 NOTE — ED Notes (Signed)
Spoke with respiratory inhalers are not available at this time

## 2021-02-16 NOTE — ED Notes (Signed)
Discussed pt with Dr. Sue Lush who believes pt is sundowning.  New orders received, will monitor.

## 2021-02-16 NOTE — ED Notes (Signed)
Pt currently urinating using female [place]. Sample collected and sent down with culture. RN notified.

## 2021-02-16 NOTE — ED Notes (Signed)
Attempted [place] and neuro check.  Pt unable to follow commands well or answer orientation questions at this time. Pt is sleepy at this time.

## 2021-02-16 NOTE — ED Notes (Signed)
New IV established, medications given.  Pt remains agitated but less restless.  Pt moaning and continues to pull at oxygen and monitors.  Dr. Benny Lennert notified via secure message of pt change, awaiting response.

## 2021-02-16 NOTE — Progress Notes (Signed)
OT Cancellation Note  Patient Details Name: Cindy Brandt MRN: [id number] DOB: 1931/10/02   Cancelled Treatment:    Reason Eval/Treat Not Completed: Fatigue/lethargy limiting ability to participate. Pt with excessive lethargy on arrival, opening eyes intermittently however unable to stay awake to answer questions or participate in eval at this time. Will follow up as able.   Helane Gunther, OT/L  [facility] [place] 02/16/2021, 8:34 AM

## 2021-02-16 NOTE — ED Notes (Signed)
PT found to be off of her oxygen and hypoxic around 80-85% saturation.  After a couple of minutes back on oxygen her agitation subsided.

## 2021-02-16 NOTE — ED Notes (Signed)
Called to room by family member.  Pt with mild agitation attempting to pull pulse ox off.  Able to comfort pt at this time, will continue to monitor

## 2021-02-17 ENCOUNTER — Other Ambulatory Visit: Payer: Self-pay

## 2021-02-17 ENCOUNTER — Inpatient Hospital Stay (HOSPITAL_COMMUNITY): Payer: Medicare Other

## 2021-02-17 DIAGNOSIS — R410 Disorientation, unspecified: Secondary | ICD-10-CM | POA: Diagnosis not present

## 2021-02-17 DIAGNOSIS — I6300 Cerebral infarction due to thrombosis of unspecified precerebral artery: Secondary | ICD-10-CM

## 2021-02-17 DIAGNOSIS — R4182 Altered mental status, unspecified: Secondary | ICD-10-CM

## 2021-02-17 LAB — CBC WITH DIFFERENTIAL/PLATELET
Abs Immature Granulocytes: 0.08 10*3/uL — ABNORMAL HIGH (ref 0.00–0.07)
Basophils Absolute: 0 10*3/uL (ref 0.0–0.1)
Basophils Relative: 0 %
Eosinophils Absolute: 0 10*3/uL (ref 0.0–0.5)
Eosinophils Relative: 0 %
HCT: 31.8 % — ABNORMAL LOW (ref 36.0–46.0)
Hemoglobin: 10.4 g/dL — ABNORMAL LOW (ref 12.0–15.0)
Immature Granulocytes: 1 %
Lymphocytes Relative: 10 %
Lymphs Abs: 1 10*3/uL (ref 0.7–4.0)
MCH: 34.3 pg — ABNORMAL HIGH (ref 26.0–34.0)
MCHC: 32.7 g/dL (ref 30.0–36.0)
MCV: 105 fL — ABNORMAL HIGH (ref 80.0–100.0)
Monocytes Absolute: 0.9 10*3/uL (ref 0.1–1.0)
Monocytes Relative: 9 %
Neutro Abs: 7.9 10*3/uL — ABNORMAL HIGH (ref 1.7–7.7)
Neutrophils Relative %: 80 %
Platelets: 284 10*3/uL (ref 150–400)
RBC: 3.03 MIL/uL — ABNORMAL LOW (ref 3.87–5.11)
RDW: 11.5 % (ref 11.5–15.5)
WBC: 9.9 10*3/uL (ref 4.0–10.5)
nRBC: 0 % (ref 0.0–0.2)

## 2021-02-17 LAB — BASIC METABOLIC PANEL
Anion gap: 11 (ref 5–15)
BUN: 13 mg/dL (ref 8–23)
CO2: 22 mmol/L (ref 22–32)
Calcium: 8.3 mg/dL — ABNORMAL LOW (ref 8.9–10.3)
Chloride: 101 mmol/L (ref 98–111)
Creatinine, Ser: 0.79 mg/dL (ref 0.44–1.00)
GFR, Estimated: >60 mL/min (ref >60)
Glucose, Bld: 161 mg/dL — ABNORMAL HIGH (ref 70–99)
Potassium: 3.9 mmol/L (ref 3.5–5.1)
Sodium: 134 mmol/L — ABNORMAL LOW (ref 135–145)

## 2021-02-17 MED ORDER — BISACODYL 10 MG RE SUPP: 10.0000 mg | Freq: Every day | RECTAL | Status: DC | PRN

## 2021-02-17 MED ORDER — LEVALBUTEROL HCL 1.25 MG/0.5ML IN NEBU
1.2500 mg | INHALATION_SOLUTION | RESPIRATORY_TRACT | Status: DC | PRN
  Filled 2021-02-17: qty 0.5

## 2021-02-17 MED ORDER — MORPHINE SULFATE (CONCENTRATE) 10 MG/0.5ML PO SOLN: 5.0000 mg | ORAL | Status: DC | PRN

## 2021-02-17 MED ORDER — HALOPERIDOL LACTATE 2 MG/ML PO CONC
2.0000 mg | ORAL | Status: DC | PRN
  Filled 2021-02-17: qty 1

## 2021-02-17 MED ORDER — ACETAMINOPHEN 650 MG RE SUPP
650.0000 mg | Freq: Four times a day (QID) | RECTAL | Status: DC | PRN
  Administered 2021-02-17: 21:00:00 650 mg via RECTAL

## 2021-02-17 MED ORDER — LORAZEPAM 2 MG/ML IJ SOLN
1.0000 mg | INTRAMUSCULAR | Status: DC | PRN
  Administered 2021-02-18: 13:00:00 1 mg via INTRAVENOUS
  Filled 2021-02-17: qty 1

## 2021-02-17 MED ORDER — GLYCOPYRROLATE 1 MG PO TABS
1.0000 mg | ORAL_TABLET | ORAL | Status: DC | PRN
  Filled 2021-02-17: qty 1

## 2021-02-17 MED ORDER — DIPHENHYDRAMINE HCL 50 MG/ML IJ SOLN: 12.5000 mg | INTRAMUSCULAR | Status: DC | PRN

## 2021-02-17 MED ORDER — HALOPERIDOL 0.5 MG PO TABS: 2.0000 mg | ORAL_TABLET | ORAL | Status: DC | PRN

## 2021-02-17 MED ORDER — LEVALBUTEROL HCL 1.25 MG/0.5ML IN NEBU
1.2500 mg | INHALATION_SOLUTION | Freq: Three times a day (TID) | RESPIRATORY_TRACT | Status: DC
  Administered 2021-02-17 (×2): 1.25 mg via RESPIRATORY_TRACT
  Filled 2021-02-17 (×3): qty 0.5

## 2021-02-17 MED ORDER — HALOPERIDOL LACTATE 5 MG/ML IJ SOLN
2.0000 mg | INTRAMUSCULAR | Status: DC | PRN
  Administered 2021-02-18: 01:00:00 2 mg via INTRAVENOUS
  Filled 2021-02-17: qty 1

## 2021-02-17 MED ORDER — ONDANSETRON 4 MG PO TBDP: 4.0000 mg | ORAL_TABLET | Freq: Four times a day (QID) | ORAL | Status: DC | PRN

## 2021-02-17 MED ORDER — ACETAMINOPHEN 325 MG PO TABS: 650.0000 mg | ORAL_TABLET | Freq: Four times a day (QID) | ORAL | Status: DC | PRN

## 2021-02-17 MED ORDER — FUROSEMIDE 10 MG/ML IJ SOLN
20.0000 mg | Freq: Once | INTRAMUSCULAR | Status: AC
  Administered 2021-02-17: 14:00:00 20 mg via INTRAVENOUS
  Filled 2021-02-17: qty 4

## 2021-02-17 MED ORDER — SODIUM CHLORIDE 0.9 % IV SOLN: INTRAVENOUS | Status: DC

## 2021-02-17 MED ORDER — GLYCOPYRROLATE 0.2 MG/ML IJ SOLN
0.2000 mg | INTRAMUSCULAR | Status: DC | PRN
  Administered 2021-02-17: 23:00:00 0.2 mg via INTRAVENOUS
  Filled 2021-02-17: qty 1

## 2021-02-17 MED ORDER — GLYCOPYRROLATE 0.2 MG/ML IJ SOLN: 0.2000 mg | INTRAMUSCULAR | Status: DC | PRN

## 2021-02-17 MED ORDER — POLYVINYL ALCOHOL 1.4 % OP SOLN
1.0000 [drp] | Freq: Four times a day (QID) | OPHTHALMIC | Status: DC | PRN
  Filled 2021-02-17: qty 15

## 2021-02-17 MED ORDER — ONDANSETRON HCL 4 MG/2ML IJ SOLN: 4.0000 mg | Freq: Four times a day (QID) | INTRAMUSCULAR | Status: DC | PRN

## 2021-02-17 MED ORDER — MORPHINE SULFATE (PF) 2 MG/ML IV SOLN
1.0000 mg | INTRAVENOUS | Status: DC | PRN
  Administered 2021-02-17 – 2021-02-18 (×2): 1 mg via INTRAVENOUS
  Filled 2021-02-17 (×2): qty 1

## 2021-02-17 MED ORDER — BIOTENE DRY MOUTH MT LIQD: 15.0000 mL | OROMUCOSAL | Status: DC | PRN

## 2021-02-17 NOTE — Progress Notes (Signed)
°  Transition of Care [facility]) Screening Note   Patient Details  Name: [name] Date of Birth: 06-02-1931   Transition of Care [facility]) CM/SW Contact:    Geralynn Ochs, LCSW Phone Number: 02/17/2021, 4:06 PM    Transition of Care Department [facility]) has reviewed patient, pending medical workup. We will continue to monitor patient advancement through interdisciplinary progression rounds. If new patient transition needs arise, please place a TOC consult.

## 2021-02-17 NOTE — Procedures (Signed)
Patient Name: Cindy Brandt  MRN: [id number]  Epilepsy Attending: Lora Havens  Referring Physician/Provider: Dr Fransisca Connors Date: 02/17/2021 Duration: 22.34 mins  Patient history: 87yo F with ams. EEG to evaluate for seizure.  Level of alertness: Awake  AEDs during EEG study: None  Technical aspects: This EEG study was done with scalp electrodes positioned according to the 10-20 International system of electrode placement. Electrical activity was acquired at a sampling rate of 500Hz  and reviewed with a high frequency filter of 70Hz  and a low frequency filter of 1Hz . EEG data were recorded continuously and digitally stored.   Description: The posterior dominant rhythm consists of 7.5 Hz activity of moderate voltage (25-35 uV) seen predominantly in posterior head regions, symmetric and reactive to eye opening and eye closing. EEG showed continuous 3 to 6 Hz theta-delta slowing in right hemisphere. Intermittent 3-5Hz  theta-delta slowing in left hemisphere. Sharp transients were seen in right temporal region. Hyperventilation and photic stimulation were not performed.     ABNORMALITY - Continuous slow, right hemisphere - Intermittent slow, left hemisphere  IMPRESSION: This study is suggestive of cortical dysfunction arising from right hemisphere,  nonspecific etiology but likely secondary to underlying stroke, post-ictal state. There is also non-specific cortical dysfunction arising from left hemisphere. No seizures or  definite epileptiform discharges were seen throughout the recording.   [name]

## 2021-02-17 NOTE — Progress Notes (Signed)
Patient unavailable for EEG.  Relocating to 1R17.

## 2021-02-17 NOTE — ED Notes (Signed)
Pt linens and brief was changed.

## 2021-02-17 NOTE — Progress Notes (Signed)
EEG complete - results pending 

## 2021-02-17 NOTE — Progress Notes (Signed)
Pt noted to be compacted w/ feces when suppository Tylenol placed. Pt de-compacted at this time. Dr. Koleen Nimrod

## 2021-02-17 NOTE — ED Notes (Signed)
Attempted report x1. 

## 2021-02-17 NOTE — Progress Notes (Signed)
STROKE TEAM PROGRESS NOTE   INTERVAL HISTORY Patient seen in her room. Her daughter is at bedside. The patient developed agitation, confusion, and decrease in interaction or level of alertness before 6:00pm last night. She was given IV haldol with only minimal improvement.  She is quite drowsy today and not arousable or following any commands.  She is moving the right side semipurposeful he but not moving left side as well.  EEG has been done results are pending. Vitals:   02/17/21 0900 02/17/21 1000 02/17/21 1209 02/17/21 1405  BP: (!) 142/74 (!) 167/91 (!) 152/76   Pulse: 91 (!) 101 94 (!) 102  Resp: (!) 22 (!) 26  20  Temp:      TempSrc:      SpO2: 99% 96% 95% 92%  Weight:      Height:       CBC:  Recent Labs  Lab 02/15/21 1439 02/17/21 0518  WBC 6.9 9.9  NEUTROABS 5.0 7.9*  HGB 10.8* 10.4*  HCT 34.1* 31.8*  MCV 107.2* 105.0*  PLT 249 659   Basic Metabolic Panel:  Recent Labs  Lab 02/15/21 1439 02/17/21 0518  NA 133* 134*  K 4.2 3.9  CL 99 101  CO2 26 22  GLUCOSE 136* 161*  BUN 20 13  CREATININE 0.90 0.79  CALCIUM 9.2 8.3*   Lipid Panel:  Recent Labs  Lab 02/16/21 0239  CHOL 87  TRIG 58  HDL 34*  CHOLHDL 2.6  VLDL 12  LDLCALC 41   HgbA1c:  Recent Labs  Lab 02/16/21 0239  HGBA1C 6.5*   Urine Drug Screen: No results for input(s): LABOPIA, COCAINSCRNUR, LABBENZ, AMPHETMU, THCU, LABBARB in the last 168 hours.  Alcohol Level No results for input(s): ETH in the last 168 hours.  IMAGING past 24 hours DG CHEST PORT 1 VIEW  Result Date: 02/17/2021 CLINICAL DATA:  Wheezing, altered mental status EXAM: PORTABLE CHEST 1 VIEW COMPARISON:  02/15/2021 FINDINGS: Cardiomegaly with mild, diffuse interstitial pulmonary opacity, unchanged. Elevation of the left hemidiaphragm unchanged. No new or focal airspace opacity. Osseous structures are unremarkable. IMPRESSION: Cardiomegaly with mild, diffuse interstitial pulmonary opacity, unchanged, likely mild edema. No new or  focal airspace opacity. Electronically Signed   By: Cindy Brandt M.D.   On: 02/17/2021 08:16    PHYSICAL EXAM  Temp:  [98.6 F (37 C)] 98.6 F (37 C) (12/19 0737) Pulse Rate:  [76-110] 102 (12/19 1405) Resp:  [16-27] 20 (12/19 1405) BP: (121-193)/(59-109) 152/76 (12/19 1209) SpO2:  [92 %-99 %] 92 % (12/19 1405) FiO2 (%):  [32 %] 32 % (12/19 1405)  General -frail elderly African-American female not in distress Ophthalmologic - fundi not visualized due to noncooperation. Cardiovascular - Regular rhythm and rate.  Mental Status -  Patient was lethargic, barely opens eyes on stimulation but will not follow commands.. Attention span limited.  She has some incoherent speech. Cranial Nerves II - XII - II - Possible left field cut, blinks to threat right stronger than left III, IV, VI - Extraocular movements intact. Right gaze preference.  Can cross midline when looking to the left but not all the way V - Facial sensation intact bilaterally. VII - Facial movement intact bilaterally. VIII - Hearing & vestibular intact bilaterally. X - Palate elevates symmetrically. XI - Chin turning & shoulder shrug intact bilaterally. XII - Tongue protrusion intact.  Motor Strength - Trace muscle movement with left hand grasp. Left arm 2/5 with drift, Left leg 2/5 but able to wiggle toes.  Withdraws left leg briskly to pain.  Right side seems to be at baseline strength with mild drift noted in right extremities. .  Bulk was normal and fasciculations were absent.   Motor Tone - Muscle tone was assessed at the neck and appendages and was normal. Reflexes - The patients reflexes were symmetrical in all extremities and she had no pathological reflexes. Sensory - Light touch, temperature/pinprick were assessed and were symmetrical.   Coordination - deferred due to weakness Gait and Station - deferred.   ASSESSMENT/PLAN Cindy Brandt is a 87 y.o. female with history of CAD s/p NSTEMI, chronic diastolic  heart failure, COPD with chronic hypoxemia on 2 L, hypertension, PVD, GERD, anxiety and depression who presents with concerns of recurrent fall and left-sided weakness. Patient has fallen 4 times in the last week. She reported pain in her legs as the cause of her falls and she is seen by cardiology for PVD. On 12/17 her family found her slumped over in her chair, unable to move her left arms and legs. They also noted slurred speech. She is currently compliant with her ASA and Plavix for CAD.   Biberebral strokes:  scattered infarcts in bilateral parietal lobe, right frontal lobe, right internal capsule likely secondary to combination of large vessel disease and small vessel disease- severe stenosis of L ICA and complete occlusion of R ICA;     Code Stroke -CT head -No acute abnormality.  CTA head & neck - complete occlusion of the R ICA, likely chronic. Stenosis of L ICA, severe atherosclerotic disease in the aorta MRI  scattered small foci of restricted diffusion in the bilateral parietal lobes, right frontal lobe, and right internal capsule, most likely acute and/or infarcts. 2D Echo- Pending LDL 41 HgbA1c 6.5 VTE prophylaxis - Lovenox aspirin 81 mg daily and clopidogrel 75 mg daily prior to admission, now on aspirin 81 mg daily and clopidogrel 75 mg daily.  Therapy recommendations:  Pending Disposition:  Pending  Hypertension Home meds:  Atenolol 25mg , Amlodipine 5mg  Stable Permissive hypertension (OK if < 220/120) but gradually normalize in 5-7 days Long-term BP goal normotensive  Hyperlipidemia Home meds:  Atorvastatin 80mg , resumed in hospital LDL 41, goal < 70 High intensity statin  Continue statin at discharge  Other Stroke Risk Factors Advanced Age >/= 63  Hx stroke/TIA Coronary artery disease On plavix and ASA Angina- Isosorbide mononitrate 120mg   Other Active Problems Acute on chronic hypoxemic respiratory failure secondary to community acquired pna Baseline 2LNC for  COPD Currently on 3L Rocephin and Azithromycin- Managed by primary team Chronic diastolic HF Monitor intake and output Consider resuming spirolactone tomorrow Managed by primary team   Hospital day # 1   Patient presented with frequent falls and confusion and MRI scan is limited due to motion artifact but does show bilateral cortical and subcortical infarcts with CT angiogram showing chronic right carotid occlusion and high-grade stenosis at origin of left ICA.  Patient's neurological baseline appears to be "poor due to significant cardiac disease and she has been told by cardiology that she is not a candidate for further cardiac revascularization..  I had a long discussion with the patient daughter at the bedside and family is not sure they would like to consider diagnostic cerebral catheter angiogram and cerebrovascular revascularization given her poor baseline functioning and medical comorbidities.  They will discuss and let us know goals of care.  They are likely leaning towards comfort care and hospice on aggressive evaluation for stroke.  Follow  EEG results recommend aspirin and Plavix and aggressive risk factor modification.     Greater than 50% time during this 25-minute visit was spent on coordination of care and counseling about her multivessel occlusive cerebrovascular disease and bilateral strokes and answered questions  Antony Contras, MD Medical Director [place] Pager: [phone] 02/17/2021 4:24 PM  To contact Stroke Continuity provider, please refer to [email or web address] After hours, contact General Neurology

## 2021-02-17 NOTE — ED Notes (Signed)
Notified MD about pt auditory wheezing and elevated BP.

## 2021-02-17 NOTE — Progress Notes (Signed)
Pt transported to room 11, tele, purewick and suction on, vitals WNL, patient responds to painful stimuli, including loud voice, not opening eyes or acknowledging family/visitors or RN. Now asleep, no s/s distress.

## 2021-02-17 NOTE — Progress Notes (Signed)
SLP Cancellation Note  Patient Details Name: [name] MRN: [id number] DOB: Jul 03, 1931   Cancelled treatment:       Reason Eval/Treat Not Completed: Fatigue/lethargy limiting ability to participate_ D/w family at bedside; poorly responsive, not alert enough to eat.  SLP will follow.  Coyt Govoni L. Tivis Ringer, [place] Office number [phone] Pager [phone]    Cindy Brandt 02/17/2021, 10:14 AM

## 2021-02-17 NOTE — Assessment & Plan Note (Addendum)
The patient developed agitation, confusion, and decrease in interaction or level of alertness before 6:00pm last night. She was given IV haldol with only minimal improvement. This morning the patient is lying with her eyes closed. She is not answering questions. She has non-purposeful movements of her head, right arm and right leg. DDx: Delirium, evolution of stroke, Seizure. Cannot re-image due to movement. Delirium precautions are in place. Avoid benzodiazepines. EEG ordered. Patient has been moved to a floor bed, which should help.

## 2021-02-17 NOTE — Progress Notes (Addendum)
PROGRESS NOTE  ADDENDUM: I attended a family meeting with Dr. Tilden Dome regarding this patients prognosis, status, and goals of care. There was consensus that the family wished only for comfort care. I told them that I would initiate this. Comfort care orders have been placed. Palliative care has been consulted.  Cindy Brandt MGQ:[id number] DOB: [birth year] DOA: 02/15/2021 PCP: [name], NP  Brief History    Cindy Brandt is a 87 y.o. female with medical history significant for CAD s/p NSTEMI, chronic diastolic heart failure, COPD with chronic hypoxemia on 2 L, hypertension, GERD, anxiety and depression who presents with concerns of recurrent fall and left-sided weakness.   Daughter at bedside provides history as patient was confused.  She reports that in the past week patient has fallen 4 times.  Patient complains of pain to her legs causing a fall which has been evaluated by cardiology before in the likely due to peripheral vascular disease.  Earlier today, she was found by family to be slumped over beside her chair and likely had fallen out of it.  When they attempted to pick her up felt like she was not moving her left arms or legs.  Also noted slurred speech prompting them to bring her here to the ER. Patient does not have history of previous stroke.  She is compliant on aspirin and Plavix for her CAD.  No prior history of atrial fibrillation.   ED Course: She was afebrile, mildly hypertensive BP of 165/75 on 3 L via nasal cannula. No leukocytosis, hemoglobin 10.8 Sodium of 133, K4.2, creatinine of 0.9, BG of 136 BNP of 262 CT chest showing left lower lobe infiltrate concerning for pneumonia Right hip x-ray was negative   Her symptoms largely resolved by the time she arrived in the ED. Neurology was initially called for concerns of TIA and MRI was recommended before considering further stroke work-up.  MRI has demonstrated scattered small foci of restricted diffusion in the  bilateral parietal lobes, right frontal lobe, and right internal capsule, most likely acute and/or infarcts. Given multiple vascular territories, an embolic etiology is suspected.  The patient developed agitation, confusion, and decrease in interaction or level of alertness before 6:00pm last night. She was given IV haldol with only minimal improvement. This morning the patient is lying with her eyes closed. She is not answering questions. She has non-purposeful movements of her head, right arm and right leg. DDx: Delirium, evolution of stroke, Seizure. Cannot re-image due to movement. Delirium precautions are in place. Avoid benzodiazepines. EEG ordered. Patient has been moved to a floor bed, which should help.  Consultants  Neurology  Procedures  None  Antibiotics   Anti-infectives (From admission, onward)    Start     Dose/Rate Route Frequency Ordered Stop   02/15/21 2230  azithromycin (ZITHROMAX) 500 mg in sodium chloride 0.9 % 250 mL IVPB        500 mg 250 mL/hr over 60 Minutes Intravenous Every 24 hours 02/15/21 2123     02/15/21 2200  cefTRIAXone (ROCEPHIN) 1 g in sodium chloride 0.9 % 100 mL IVPB        1 g 200 mL/hr over 30 Minutes Intravenous Every 24 hours 02/15/21 2123     02/15/21 1800  cefTRIAXone (ROCEPHIN) 1 g in sodium chloride 0.9 % 100 mL IVPB  Status:  Discontinued        1 g 200 mL/hr over 30 Minutes Intravenous  Once 02/15/21 1756 02/15/21 2125   02/15/21 1800  azithromycin (ZITHROMAX) 500 mg in sodium chloride 0.9 % 250 mL IVPB  Status:  Discontinued        500 mg 250 mL/hr over 60 Minutes Intravenous  Once 02/15/21 1756 02/15/21 2126      Subjective  The patient is resting comfortably. She can answer simple questions with 1-3 words. Daughter is at bedside. No new complaints.  Objective   Vitals:  Vitals:   02/17/21 1000 02/17/21 1209  BP: (!) 167/91 (!) 152/76  Pulse: (!) 101 94  Resp: (!) 26   Temp:    SpO2: 96% 95%    Exam:  Constitutional:   The patient is awake, alert, and oriented x 2. No acute distress. Eyes:  Appear deviated to the right Normal lids and conjunctivae Respiratory:  No increased work of breathing. No wheezes, rales, or rhonchi No tactile fremitus Cardiovascular:  Regular rate and rhythm No murmurs, ectopy, or gallups. No lateral PMI. No thrills. Abdomen:  Abdomen is soft, non-tender, non-distended No hernias, masses, or organomegaly Normoactive bowel sounds.  Musculoskeletal:  No cyanosis, clubbing, or edema Skin:  No rashes, lesions, ulcers palpation of skin: no induration or nodules Neurologic:  Pt is having word finding difficulty and remains confused.  Per PT no lateralizing signs Right word gaze preference Psychiatric:  Unable to evaluate as patient is unable to cooperate with exam.  I have personally reviewed the following:   Today's Data  Vitals  Lab Data  CBC BMP  Micro Data  Blood culture: No growth  Imaging  CT head MRI Brain CTA head and neck  Cardiology Data  EKG  Scheduled Meds:   stroke: mapping our early stages of recovery book   Does not apply Once   aspirin EC  81 mg Oral q morning   atorvastatin  80 mg Oral Daily   bisacodyl  10 mg Rectal Once   calcium carbonate  1,250 mg Oral Q breakfast   clopidogrel  75 mg Oral Daily   darifenacin  7.5 mg Oral Daily   docusate sodium  200 mg Oral Once   enoxaparin (LOVENOX) injection  40 mg Subcutaneous Q24H   famotidine  20 mg Oral Daily   fluticasone furoate-vilanterol  1 puff Inhalation Daily   And   umeclidinium bromide  1 puff Inhalation Daily   furosemide  20 mg Intravenous Once   isosorbide mononitrate  120 mg Oral Daily   levalbuterol  1.25 mg Nebulization Q8H   pantoprazole  40 mg Oral Daily   sertraline  100 mg Oral QHS   Continuous Infusions:  sodium chloride Stopped (02/17/21 0135)   azithromycin Stopped (02/17/21 0440)   cefTRIAXone (ROCEPHIN)  IV Stopped (02/17/21 0134)    Principal  Problem:   CVA (cerebral vascular accident) ([place]) Active Problems:   Essential hypertension   Coronary artery disease involving native coronary artery of native heart   Chronic diastolic CHF (congestive heart failure) (HCC)   AMS (altered mental status)   Acute on chronic respiratory failure with hypoxemia ([place])   Community acquired pneumonia   LOS: 1 day   A & P  CVA -no IV tPA since outside of window - MRI brain showing scattered small foci of restricted diffusion in the bilateral parietal lobe, right frontal lobe and right internal capsule likely embolic source - CTA head and neck showed complete occlusion of the right RCA and severe focal stenosis in the proximal left ICA that neurology feels is likely chronic -Obtain echocardiogram  -Obtain A1c and lipids -  PT/OT/SLT -Frequent neuro checks and keep on telemetry -Allow for permissive hypertension with blood pressure treatment as needed only if systolic goes above 211 -Continue home antiplatelet therapy with aspirin and Plavix -Continue atorvastatin -Neurology has been consulted.  She has been cleared for a diet by speech on 02/16/2021, although her mental status has changed. - I have discussed this patient with Dr. Tilden Dome. There is no benefit to the patient for cerebral arteriogram as she is not a good candidate for re-vascularization and it will not change her outcome. Will consult palliative care.  Delirium The patient developed agitation, confusion, and decrease in interaction or level of alertness before 6:00pm last night. She was given IV haldol with only minimal improvement. This morning the patient is lying with her eyes closed. She is not answering questions. She has non-purposeful movements of her head, right arm and right leg. DDx: Delirium, evolution of stroke, Seizure. Cannot re-image due to movement. Delirium precautions are in place. Avoid benzodiazepines. EEG ordered. Patient has been moved to a floor bed, which should  help.  Wheezing:  Diffuse wheezes through out the chest. Pt has maintained her saturations in the 90's. CXR demonstrated some mild pulmonary edema. The patient has been given a one time dose of lasix and albuterol.   Acute on chronic hypoxemic respiratory failure secondary to community-acquired pneumonia and COPD exacerbation - Patient at baseline on 2 L for her COPD.  Requiring 3 L on admission - CT chest showing patchy consolidation in the left lower lobe consistent with acute infiltrate -continue IV Rocephin and Azithromycin    Chronic diastolic HF -BNP is unequivocal at 262 and she appears to be euvolemic on exam.  CTA head and neck noted possible opacity concerning for pulmonary edema. Will monitor her intake and output. -hold spirolactone overnight while allowing for permissive HTN   CAD Continue aspirin and Plavix Continue Imdur   Hypertension Hold amlodipine, atenolol to allow for permissive hypertension  I have seen and examined this patient myself. I have spent 38 minutes in her evaluation and care.   DVT prophylaxis:.Lovenox Code Status:Limited- CPR only. Verified MOST form at bedside with daughter Family Communication: Plan discussed with daughter at bedside  disposition  Plan: tbd Consults called: neurology Admission status: Inpatient   Level of care: Telemetry Medical   Status is: Inpatient  Cristel Rail, DO [facility] Direct contact: see [email or web address]  7PM-7AM contact night coverage as above 02/17/2021, 1:26 PM  LOS: 0 days

## 2021-02-17 NOTE — ED Notes (Signed)
Breakfast orders placed 

## 2021-02-17 NOTE — ED Notes (Signed)
Attempted report x 2 

## 2021-02-18 MED ORDER — ACETAMINOPHEN 650 MG RE SUPP
650.0000 mg | Freq: Once | RECTAL | Status: AC
  Administered 2021-02-18: 18:00:00 650 mg via RECTAL
  Filled 2021-02-18 (×2): qty 1

## 2021-02-18 MED ORDER — LORAZEPAM 2 MG/ML IJ SOLN: 1.0000 mg | Freq: Once | INTRAMUSCULAR | Status: AC

## 2021-02-18 NOTE — Progress Notes (Signed)
STROKE TEAM PROGRESS NOTE   INTERVAL HISTORY Patient seen in her room. Her daughter is at bedside. The has been made palliative care and comfort care only.  She is resting comfortably.  Vital signs are stable.  EEG done yesterday showed nonspecific right hemispheric cortical dysfunction but no definite seizure activity. Vitals:   02/17/21 2006 02/17/21 2115 02/18/21 0818 02/18/21 1237  BP: (!) 153/94  (!) 173/101   Pulse: 92  93   Resp: (!) 28  20   Temp: 99.8 F (37.7 C) 99.5 F (37.5 C) (!) 100.8 F (38.2 C) (!) 103 F (39.4 C)  TempSrc: Axillary Axillary Axillary   SpO2:   96%   Weight:      Height:       CBC:  Recent Labs  Lab 02/15/21 1439 02/17/21 0518  WBC 6.9 9.9  NEUTROABS 5.0 7.9*  HGB 10.8* 10.4*  HCT 34.1* 31.8*  MCV 107.2* 105.0*  PLT 249 016   Basic Metabolic Panel:  Recent Labs  Lab 02/15/21 1439 02/17/21 0518  NA 133* 134*  K 4.2 3.9  CL 99 101  CO2 26 22  GLUCOSE 136* 161*  BUN 20 13  CREATININE 0.90 0.79  CALCIUM 9.2 8.3*   Lipid Panel:  Recent Labs  Lab 02/16/21 0239  CHOL 87  TRIG 58  HDL 34*  CHOLHDL 2.6  VLDL 12  LDLCALC 41   HgbA1c:  Recent Labs  Lab 02/16/21 0239  HGBA1C 6.5*   Urine Drug Screen: No results for input(s): LABOPIA, COCAINSCRNUR, LABBENZ, AMPHETMU, THCU, LABBARB in the last 168 hours.  Alcohol Level No results for input(s): ETH in the last 168 hours.  IMAGING past 24 hours EEG adult  Result Date: 02/17/2021 Lora Havens, MD     02/17/2021  4:25 PM Patient Name: Cindy Brandt MRN: [id number] Epilepsy Attending: Lora Havens Referring Physician/Provider: Dr Fransisca Connors Date: 02/17/2021 Duration: 22.34 mins Patient history: 87yo F with ams. EEG to evaluate for seizure. Level of alertness: Awake AEDs during EEG study: None Technical aspects: This EEG study was done with scalp electrodes positioned according to the 10-20 International system of electrode placement. Electrical activity was acquired at a  sampling rate of 500Hz  and reviewed with a high frequency filter of 70Hz  and a low frequency filter of 1Hz . EEG data were recorded continuously and digitally stored. Description: The posterior dominant rhythm consists of 7.5 Hz activity of moderate voltage (25-35 uV) seen predominantly in posterior head regions, symmetric and reactive to eye opening and eye closing. EEG showed continuous 3 to 6 Hz theta-delta slowing in right hemisphere. Intermittent 3-5Hz  theta-delta slowing in left hemisphere. Sharp transients were seen in right temporal region. Hyperventilation and photic stimulation were not performed.   ABNORMALITY - Continuous slow, right hemisphere - Intermittent slow, left hemisphere IMPRESSION: This study is suggestive of cortical dysfunction arising from right hemisphere,  nonspecific etiology but likely secondary to underlying stroke, post-ictal state. There is also non-specific cortical dysfunction arising from left hemisphere. No seizures or  definite epileptiform discharges were seen throughout the recording. Priyanka Barbra Sarks    PHYSICAL EXAM  Temp:  [99.5 F (37.5 C)-103 F (39.4 C)] 103 F (39.4 C) (12/20 1237) Pulse Rate:  [92-93] 93 (12/20 0818) Resp:  [20-28] 20 (12/20 0818) BP: (153-173)/(94-101) 173/101 (12/20 0818) SpO2:  [96 %] 96 % (12/20 0818)  General -frail elderly African-American female not in distress Ophthalmologic - fundi not visualized due to noncooperation. Cardiovascular - Regular rhythm and  rate.  Mental Status -  Patient was lethargic, barely opens eyes on stimulation but will not follow commands.. Attention span limited.  She has some incoherent speech. Cranial Nerves II - XII - II - Possible left field cut, blinks to threat right stronger than left III, IV, VI - Extraocular movements intact. Right gaze preference.  Can cross midline when looking to the left but not all the way V - Facial sensation intact bilaterally. VII - Facial movement intact  bilaterally. VIII - Hearing & vestibular intact bilaterally. X - Palate elevates symmetrically. XI - Chin turning & shoulder shrug intact bilaterally. XII - Tongue protrusion intact.  Motor Strength - Trace muscle movement with left hand grasp. Left arm 2/5 with drift, Left leg 2/5 but able to wiggle toes.  Withdraws left leg briskly to pain.  Right side seems to be at baseline strength with mild drift noted in right extremities. .  Bulk was normal and fasciculations were absent.   Motor Tone - Muscle tone was assessed at the neck and appendages and was normal. Reflexes - The patients reflexes were symmetrical in all extremities and she had no pathological reflexes. Sensory - Light touch, temperature/pinprick were assessed and were symmetrical.   Coordination - deferred due to weakness Gait and Station - deferred.   ASSESSMENT/PLAN Ms. MAYSON STERBENZ is a 87 y.o. female with history of CAD s/p NSTEMI, chronic diastolic heart failure, COPD with chronic hypoxemia on 2 L, hypertension, PVD, GERD, anxiety and depression who presents with concerns of recurrent fall and left-sided weakness. Patient has fallen 4 times in the last week. She reported pain in her legs as the cause of her falls and she is seen by cardiology for PVD. On 12/17 her family found her slumped over in her chair, unable to move her left arms and legs. They also noted slurred speech. She is currently compliant with her ASA and Plavix for CAD.   Biberebral strokes:  scattered infarcts in bilateral parietal lobe, right frontal lobe, right internal capsule likely secondary to combination of large vessel disease and small vessel disease- severe stenosis of L ICA and complete occlusion of R ICA;     Code Stroke -CT head -No acute abnormality.  CTA head & neck - complete occlusion of the R ICA, likely chronic. Stenosis of L ICA, severe atherosclerotic disease in the aorta MRI  scattered small foci of restricted diffusion in the bilateral  parietal lobes, right frontal lobe, and right internal capsule, most likely acute and/or infarcts. 2D Echo- Pending LDL 41 HgbA1c 6.5 VTE prophylaxis - Lovenox aspirin 81 mg daily and clopidogrel 75 mg daily prior to admission, now on aspirin 81 mg daily and clopidogrel 75 mg daily.  Therapy recommendations:  Pending Disposition:  Pending  Hypertension Home meds:  Atenolol 25mg , Amlodipine 5mg  Stable Permissive hypertension (OK if < 220/120) but gradually normalize in 5-7 days Long-term BP goal normotensive  Hyperlipidemia Home meds:  Atorvastatin 80mg , resumed in hospital LDL 41, goal < 70 High intensity statin  Continue statin at discharge  Other Stroke Risk Factors Advanced Age >/= 32  Hx stroke/TIA Coronary artery disease On plavix and ASA Angina- Isosorbide mononitrate 120mg   Other Active Problems Acute on chronic hypoxemic respiratory failure secondary to community acquired pna Baseline 2LNC for COPD Currently on 3L Rocephin and Azithromycin- Managed by primary team Chronic diastolic HF Monitor intake and output Consider resuming spirolactone tomorrow Managed by primary team   Hospital day # 2  Continue comfort care  measures only.  Long discussion at bedside with patient daughter and answered questions.  Stroke team will sign off.  Kindly call for questions.     Greater than 50% time during this 25-minute visit was spent on coordination of care and counseling about her multivessel occlusive cerebrovascular disease and bilateral strokes and answered questions  Antony Contras, MD Medical Director [place] Pager: [phone] 02/18/2021 2:53 PM  To contact Stroke Continuity provider, please refer to [email or web address] After hours, contact General Neurology

## 2021-02-18 NOTE — Progress Notes (Signed)
Progress Note    Cindy Brandt   YQM:[id number]  DOB: Nov 12, 1931  DOA: 02/15/2021     2 PCP: [name], NP  Initial CC: fall and left side weakness   Hospital Course:  Cindy Brandt is a 87 y.o. female with medical history significant for CAD s/p NSTEMI, chronic diastolic heart failure, COPD with chronic hypoxemia on 2 L, hypertension, GERD, anxiety and depression who presented with recurrent fall and left-sided weakness.  Patient was unable to provide collateral information on admission and HPI was provided by the patient's daughter. Patient was found slumped over beside her chair as if she had fallen out of it.  Upon attempts to mobilize patient she was not moving her left arm or leg.  She was also noted to have slurred speech and was brought to the ER for further evaluation. Neurology was consulted and she underwent further stroke work-up.   MRI demonstrated scattered small foci of restricted diffusion in the bilateral parietal lobes, right frontal lobe, and right internal capsule, most likely acute and/or infarcts. Given multiple vascular territories, an embolic etiology is suspected. An EEG was also obtained due to her encephalopathy.  This was negative for seizure activity but did show cortical dysfunction arising from the right hemisphere possibly due to her underlying stroke. A family meeting was held with neurology on 02/17/2021 and poor prognosis was discussed.  Ultimately decision was made by family for transitioning to hospice/comfort care.  Interval History:  No events overnight.  Daughter, [name], present this morning. Patient also developed fever this afternoon and remained noninteractive.  She also appeared restless and trial of Ativan to be given.  Assessment & Plan:  CVA -no IV tPA since outside of window - MRI brain showing scattered small foci of restricted diffusion in the bilateral parietal lobe, right frontal lobe and right internal capsule likely embolic  source - CTA head and neck showed complete occlusion of the right RCA and severe focal stenosis in the proximal left ICA that neurology feels is likely chronic - overall poor prognosis per neurology; s/p family meeting on 12/19; decision made for pursuing hospice/comfort care - d/c non-essential meds at this time   Acute encephalopathy  - continue comfort measures   Acute on chronic hypoxemic respiratory failure secondary to community-acquired pneumonia and COPD exacerbation - Patient at baseline on 2 L for her COPD.  Requiring 3 L on admission - CT chest showing patchy consolidation in the left lower lobe consistent with acute infiltrate - s/p azithro and CTX on admission - she may have also aspirated some  - d/c abx as focus now comfort - tylenol PRN for fever   Chronic diastolic CHF -BNP is unequivocal at 262 and she appears to be euvolemic on exam.   - comfort care   CAD HTN - d/c aspirin, plavix, imdur - d/c BP meds      Old records reviewed in assessment of this patient  Antimicrobials:   DVT prophylaxis:   Code Status:   Code Status: DNR  Disposition Plan:   Status is: Inpt  Objective: Blood pressure (!) 173/101, pulse 93, temperature (!) 103 F (39.4 C), resp. rate 20, height 5\' 5"  (1.651 m), weight 64.3 kg, SpO2 96 %.  Examination:  Physical Exam Constitutional:      Comments: Moaning in bed, noninteractive   HENT:     Head: Normocephalic and atraumatic.     Mouth/Throat:     Mouth: Mucous membranes are dry.  Eyes:  Extraocular Movements: Extraocular movements intact.  Cardiovascular:     Rate and Rhythm: Normal rate and regular rhythm.  Pulmonary:     Effort: Pulmonary effort is normal.     Breath sounds: Normal breath sounds.  Abdominal:     General: Bowel sounds are normal. There is no distension.     Palpations: Abdomen is soft.     Tenderness: There is no abdominal tenderness.  Musculoskeletal:        General: Normal range of motion.      Cervical back: Normal range of motion and neck supple.  Skin:    General: Skin is warm and dry.  Neurological:     Comments: Does not withdraw to pain in upper extremities.  Does withdrawal to pain in bilateral feet.  Unable to follow commands     Consultants:    Procedures:    Data Reviewed: I have personally reviewed labs and imaging studies    LOS: 2 days  Time spent: Greater than 50% of the 35 minute visit was spent in counseling/coordination of care for the patient as laid out in the A&P.   Dwyane Dee, MD [facility] 02/18/2021, 2:38 PM

## 2021-02-18 NOTE — Progress Notes (Signed)
Pt restless and pulling at nasal canula tubing. This type of activity was noted from start of this shift until pt administered her prn Haldol at 0119.

## 2021-02-18 NOTE — TOC Initial Note (Signed)
Transition of Care [facility]) - Initial/Assessment Note    Patient Details  Name: Cindy Brandt MRN: [id number] Date of Birth: [birth year]  Transition of Care [facility]) CM/SW Contact:    Geralynn Ochs, LCSW Phone Number: 02/18/2021, 11:11 AM  Clinical Narrative:       CSW met with daughter, [name], at bedside per request to discuss hospice placement. [name] indicated that they had some familiarity with [facility] and would like to transition there, if patient doesn't pass quickly in the hospital. [name] expressed concern about not having children be able to visit the patient at the hospital due to visitation policy, asking about visitation policy at [facility], as the children will want to be present. CSW to clarify visitation policy with both the hospital (if exceptions are made for comfort care patients) and at [facility]. CSW sent referral to [facility] for review. CSW to follow.            Expected Discharge Plan: [place] Barriers to Discharge: Continued Medical Work up   Patient Goals and CMS Choice Patient states their goals for this hospitalization and ongoing recovery are:: patient unable to participate in goal setting, not oriented CMS Medicare.gov Compare Post Acute Care list provided to:: Patient Represenative (must comment) Choice offered to / list presented to : Adult Children  Expected Discharge Plan and Services Expected Discharge Plan: [place]     Post Acute Care Choice: Hospice Living arrangements for the past 2 months: Single Family Home                                      Prior Living Arrangements/Services Living arrangements for the past 2 months: Single Family Home Lives with:: Adult Children Patient language and need for interpreter reviewed:: No Do you feel safe going back to the place where you live?: Yes      Need for Family Participation in Patient Care: Yes (Comment) Care giver support system in place?: No  (comment)   Criminal Activity/Legal Involvement Pertinent to Current Situation/Hospitalization: No - Comment as needed  Activities of Daily Living Home Assistive Devices/Equipment: Walker (specify type), Raised toilet seat with rails, Shower chair with back, Grab bars in shower, Hand-held shower hose, Eyeglasses, Dentures (specify type), Blood pressure cuff ADL Screening (condition at time of admission) Patient's cognitive ability adequate to safely complete daily activities?: No Is the patient deaf or have difficulty hearing?: Yes Does the patient have difficulty seeing, even when wearing glasses/contacts?: No Does the patient have difficulty concentrating, remembering, or making decisions?: Yes (forgetful atg times) Patient able to express need for assistance with ADLs?: No Does the patient have difficulty dressing or bathing?: Yes Independently performs ADLs?: No Communication: Dependent Is this a change from baseline?: Change from baseline, expected to last <3 days Dressing (OT): Dependent Is this a change from baseline?: Change from baseline, expected to last <3days Grooming: Dependent Is this a change from baseline?: Change from baseline, expected to last <3 days Feeding: Dependent Is this a change from baseline?: Change from baseline, expected to last <3 days Bathing: Dependent Is this a change from baseline?: Change from baseline, expected to last <3 days Toileting: Dependent Is this a change from baseline?: Change from baseline, expected to last <3 days In/Out Bed: Dependent Is this a change from baseline?: Change from baseline, expected to last <3 days Walks in Home: Independent with device (comment)  Does the patient have difficulty walking or climbing stairs?: Yes Weakness of Legs: Both Weakness of Arms/Hands: None  Permission Sought/Granted Permission sought to share information with : Facility Sport and exercise psychologist, Family Supports Permission granted to share  information with : Yes, Verbal Permission Granted  Share Information with NAME: Romilda Garret  Permission granted to share info w AGENCY: International aid/development worker granted to share info w Relationship: Daughters     Emotional Assessment Appearance:: Appears stated age Attitude/Demeanor/Rapport: Unable to Assess Affect (typically observed): Unable to Assess   Alcohol / Substance Use: Not Applicable Psych Involvement: No (comment)  Admission diagnosis:  Wheezing [R06.2] TIA (transient ischemic attack) [G45.9] Pain [R52] Fall [W19.XXXA] CVA (cerebral vascular accident) ([place]) [I63.9] Altered mental status, unspecified altered mental status type [R41.82] Community acquired pneumonia of left lower lobe of lung [J18.9] AMS (altered mental status) [R41.82] Patient Active Problem List   Diagnosis Date Noted   Delirium 02/17/2021   CVA (cerebral vascular accident) ([place]) 02/16/2021   Acute on chronic respiratory failure with hypoxemia ([place]) 02/16/2021   Community acquired pneumonia 02/16/2021   AMS (altered mental status) 02/15/2021   Recurrent falls 11/01/2020   Memory changes 05/10/2020   Chronic obstructive pulmonary disease ([place]) 02/05/2020   Sensation of fullness in left ear 02/05/2020   Pain in both feet 01/04/2020   Chronic diastolic CHF (congestive heart failure) ([place]) 12/23/2019   Left bundle branch block 12/23/2019   Cough 10/27/2019   Pain of right lower extremity 06/26/2019   Dizziness 06/26/2019   Herpes zoster without complication 11/28/5745   Angular cheilitis 05/04/2018   Hemorrhoids 04/11/2018   Lower abdominal pain 04/11/2018   Osteoporosis 11/15/2017   Prediabetes 11/15/2017   Presbycusis of both ears 11/16/2016   Anemia 06/20/2016   Dyspnea    NSTEMI (non-ST elevated myocardial infarction) ([place]) 06/14/2016   Unstable angina ([place]) 06/14/2016   Hx of adenomatous colonic polyps 04/06/2016   GERD without esophagitis 10/29/2015   IBS (irritable bowel syndrome)  10/29/2015   Urinary incontinence 10/29/2015   Osteoarthritis 34/05/7094   Diastolic dysfunction 43/83/8184   Chest pain 11/23/2011   Chronic pain of left knee 01/08/2011   HYPOKALEMIA 07/31/2009   Mixed hyperlipidemia 05/22/2009   Anxiety and depression 05/22/2009   Essential hypertension 05/22/2009   Coronary artery disease involving native coronary artery of native heart 05/22/2009   PCP:  [name], NP Pharmacy[facility]   [id number]Phone: [phone]: [phone]    Social Determinants of Health (SDOH) Interventions    Readmission Risk Interventions No flowsheet data found.

## 2021-02-18 NOTE — Progress Notes (Signed)
Manufacturing engineer [facility]) Hospital Liaison note.     Received request from [place] for family interest in [facility]. Patient information has been forwarded to [facility] for review.   A Please do not hesitate to call with questions.    Thank you,   Farrel Gordon, RN, [place] Hospital Liaison  [phone]

## 2021-02-18 NOTE — Plan of Care (Signed)
°  Problem: Clinical Measurements: Goal: Ability to maintain clinical measurements within normal limits will improve Outcome: Not Progressing Goal: Will remain free from infection Outcome: Not Progressing Goal: Diagnostic test results will improve Outcome: Not Progressing Goal: Respiratory complications will improve Outcome: Not Progressing Goal: Cardiovascular complication will be avoided Outcome: Not Progressing

## 2021-02-18 NOTE — Progress Notes (Signed)
[id number]   [facility]) Hospital Liaison Note  Hospice eligibility confirmed. [facility] is unable to offer a room today. Hospital Liaison will follow up tomorrow or sooner if a room becomes available. Please do not hesitate to call with questions.    Thank you for the opportunity to participate in this patient's care.  Buck Mam [facility] Hospital Liaison  [phone]

## 2021-02-18 NOTE — Plan of Care (Signed)

## 2021-02-19 DIAGNOSIS — Z515 Encounter for palliative care: Secondary | ICD-10-CM

## 2021-02-19 MED ORDER — HALOPERIDOL LACTATE 2 MG/ML PO CONC
2.0000 mg | ORAL | 0 refills | Status: AC | PRN
Start: 2021-02-19 — End: ?

## 2021-02-19 MED ORDER — MORPHINE SULFATE (CONCENTRATE) 10 MG/0.5ML PO SOLN: 5.0000 mg | ORAL | 0 refills | Status: AC | PRN

## 2021-02-19 MED ORDER — GLYCOPYRROLATE 1 MG PO TABS: 1.0000 mg | ORAL_TABLET | ORAL | Status: AC | PRN

## 2021-02-19 NOTE — Care Management Important Message (Signed)
Important Message  Patient Details  Name: Cindy Brandt MRN: [id number] Date of Birth: [birth year]   Medicare Important Message Given:  Yes     [name] [address] 02/19/2021, 1:59 PM

## 2021-02-19 NOTE — TOC Transition Note (Signed)
Transition of Care [facility]) - CM/SW Discharge Note   Patient Details  Name: BRENLYN BESHARA MRN: [id number] Date of Birth: 1931-04-24  Transition of Care [facility]) CM/SW Contact:  Geralynn Ochs, LCSW Phone Number: 02/19/2021, 10:48 AM   Clinical Narrative:   Nurse to call report to [phone].    Final next level of care: [place] Barriers to Discharge: Barriers Resolved   Patient Goals and CMS Choice Patient states their goals for this hospitalization and ongoing recovery are:: patient unable to participate in goal setting, not oriented CMS Medicare.gov Compare Post Acute Care list provided to:: Patient Represenative (must comment) Choice offered to / list presented to : Adult Children  Discharge Placement                Patient to be transferred to facility by: [facility] Name of family member notified: Daughters Patient and family notified of of transfer: 02/19/21  Discharge Plan and Services     Post Acute Care Choice: Hospice                               Social Determinants of Health (SDOH) Interventions     Readmission Risk Interventions No flowsheet data found.

## 2021-02-19 NOTE — Progress Notes (Addendum)
[id number]  [facility]) Hospital Liaison Note  Bed offered and accepted. Family aware and agreeable to transfer today. Please send signed DNR and paperwork with patient. Unit RN please call report to [phone] prior to patient leaving the unit.   TOC please arrange transportation when confirmation has been received that consents are complete.   Please call with any questions. Thank you.   Buck Mam [facility] Liaison  [phone]

## 2021-02-19 NOTE — Consult Note (Signed)
° °  [facility] CM Inpatient Consult   02/19/2021  Cindy Brandt 02/23/1932 [id number]  [facility] [ACO] Patient: [employer] Medicare  Primary Care Provider: [name], NP, [facility], [facility] is an Embedded provider  Chart reviewed and reveals the patient is currently transitioning to Hospice/Palliative Care residential..   Will notify patient [place] of disposition/transition of care.  Natividad Brood, RN BSN [facility] Liaison  [phone] business mobile phone Toll free office [phone]  Fax number: [phone] [email or web address]

## 2021-02-19 NOTE — Discharge Summary (Signed)
Physician Discharge Summary  Cindy Brandt ZLD:[id number] DOB: 1931-08-30 DOA: 02/15/2021  PCP: [name], NP  Admit date: 02/15/2021 Discharge date: 02/19/2021  Admitted From: home Disposition:  residential hospice  CODE STATUS: DNR Diet recommendation: comfort feeding  HPI: Per admitting MD, Cindy Brandt is a 87 y.o. female with medical history significant for CAD s/p NSTEMI, chronic diastolic heart failure, COPD with chronic hypoxemia on 2 L, hypertension, GERD, anxiety and depression who presents with concerns of recurrent fall and left-sided weakness. Daughter at bedside provides history as patient was confused.  She reports that in the past week patient has fallen 4 times.  Patient complains of pain to her legs causing a fall which has been evaluated by cardiology before in the likely due to peripheral vascular disease.  Earlier today, she was found by family to be slumped over beside her chair and likely had fallen out of it.  When they attempted to pick her up felt like she was not moving her left arms or legs.  Also noted slurred speech prompting them to bring her here to the ER. Patient does not have history of previous stroke.  She is compliant on aspirin and Plavix for her CAD.  No prior history of atrial fibrillation.  Hospital Course / Discharge diagnoses: Principal problem CVA -no IV tPA since outside of window. MRI brain showing scattered small foci of restricted diffusion in the bilateral parietal lobe, right frontal lobe and right internal capsule likely embolic source. CTA head and neck showed complete occlusion of the right RCA and severe focal stenosis in the proximal left ICA that neurology feels is likely chronic. Overall poor prognosis per neurology; s/p family meeting on 12/19; decision made for pursuing hospice/comfort care. D/c non-essential meds at this time.  She will be discharged to residential hospice  Active problems Acute metabolic  encephalopathy -  continue comfort measures Acute on chronic hypoxemic respiratory failure secondary to community-acquired pneumonia and COPD exacerbation-Patient at baseline on 2 L for her COPD.  Requiring 3 L on admission. CT chest showing patchy consolidation in the left lower lobe consistent with acute infiltrate. S/p azithro and CTX on admission. She may have also aspirated some.  Continue oxygen for comfort Chronic diastolic CHF  CAD HTN HLD Macrocytic anemia Hyponatremia  Sepsis ruled out   Discharge Instructions   Allergies as of 02/19/2021       Reactions   Ranexa [ranolazine] Other (See Comments)   Does NOT "agree" with the patient- does not feel like herself        Medication List     STOP taking these medications    amLODipine 5 MG tablet Commonly known as: NORVASC   aspirin EC 81 MG tablet   atenolol 25 MG tablet Commonly known as: TENORMIN   atorvastatin 80 MG tablet Commonly known as: LIPITOR   calcium carbonate 600 MG Tabs tablet Commonly known as: OS-CAL   cetirizine 10 MG tablet Commonly known as: ZYRTEC   clopidogrel 75 MG tablet Commonly known as: PLAVIX   famotidine 20 MG tablet Commonly known as: PEPCID   fluticasone 50 MCG/ACT nasal spray Commonly known as: FLONASE   gabapentin 100 MG capsule Commonly known as: NEURONTIN   isosorbide mononitrate 120 MG 24 hr tablet Commonly known as: IMDUR   melatonin 5 MG Tabs   nitroGLYCERIN 0.4 MG SL tablet Commonly known as: NITROSTAT   pantoprazole 40 MG tablet Commonly known as: PROTONIX   ProAir HFA 108 (90 Base)  MCG/ACT inhaler Generic drug: albuterol   sertraline 100 MG tablet Commonly known as: ZOLOFT   solifenacin 5 MG tablet Commonly known as: VESICARE   spironolactone 25 MG tablet Commonly known as: ALDACTONE   Trelegy Ellipta 100-62.5-25 MCG/ACT Aepb Generic drug: Fluticasone-Umeclidin-Vilant       TAKE these medications    glycopyrrolate 1 MG tablet Commonly known as:  ROBINUL Take 1 tablet (1 mg total) by mouth every 4 (four) hours as needed (excessive secretions).   haloperidol 2 MG/ML solution Commonly known as: HALDOL Place 1 mL (2 mg total) under the tongue every 4 (four) hours as needed for agitation (or delirium).   morphine CONCENTRATE 10 MG/0.5ML Soln concentrated solution Take 0.25 mLs (5 mg total) by mouth every 2 (two) hours as needed for moderate pain (or dyspnea).         Consultations: Neurology  Palliative care  Procedures/Studies:  CT ANGIO HEAD NECK W WO CM  Result Date: 02/15/2021 CLINICAL DATA:  Stroke, follow-up EXAM: CT ANGIOGRAPHY HEAD AND NECK TECHNIQUE: Multidetector CT imaging of the head and neck was performed using the standard protocol during bolus administration of intravenous contrast. Multiplanar CT image reconstructions and MIPs were obtained to evaluate the vascular anatomy. Carotid stenosis measurements (when applicable) are obtained utilizing NASCET criteria, using the distal internal carotid diameter as the denominator. CONTRAST:  37mL OMNIPAQUE IOHEXOL 350 MG/ML SOLN COMPARISON:  No prior CTA, correlation is made with CT head 02/15/2021 and MRI head 02/15/2021 FINDINGS: CT HEAD FINDINGS For noncontrast findings, please see same day CT head. CTA NECK FINDINGS Aortic arch: Two-vessel arch with a common origin of the brachiocephalic and left common carotid arteries. Imaged portion shows no evidence of aneurysm or dissection. Moderate to severe calcified and noncalcified aortic atherosclerosis, which extends into the branch vessels, without significant stenosis Right carotid system: The right common carotid artery demonstrates multifocal calcification, which is not hemodynamically significant, until the distal CCA/bifurcation, where it appears occlusive (series 8, images 190 3-196). Minimal flow is seen in the proximal right ICA (series 8, image 186), but no flow is seen in the mid or distal right ICA. The right ECA appears  patent. Left carotid system: Calcifications in the left CCA, which are not hemodynamically significant. At the bifurcation, there is severe calcified narrowing, with near complete occlusion (series 8, image 188) in the proximal to mid left ICA, with distal reconstitution, although the vessel is diminutive and flow is minimal (series 8, image 144). Vertebral arteries: Mild calcifications at the origin of the left vertebral artery, which appears otherwise patent. The left vertebral artery is patent with scattered calcifications in the V1 and V2 segments. No dissection, significant stenosis, or occlusion. Skeleton: Degenerative changes in the cervical spine. No acute osseous abnormality. Other neck: Negative. Upper chest: Emphysema. Interlobular septal thickening and ground-glass opacities, concerning for pulmonary edema. Review of the MIP images confirms the above findings CTA HEAD FINDINGS Anterior circulation: No opacification of the right petrous and cavernous ICA, with distal reconstitution in the supraclinoid right ICA, likely retrograde. Moderate to severe calcifications are noted in the cavernous segments bilaterally. Minimal flow in the petrous and cavernous left ICA. Poor opacification of the left-greater-than-right A1 segment. Minimal contrast is seen in the distal ACAs, which limits evaluation. Mild narrowing in the right A1 segment. No focal stenosis in the left A1. Poor opacification of the right-greater-than-left distal MCA branches, which limits evaluation. Posterior circulation: The bilateral vertebral arteries are patent to the vertebrobasilar junction. Mild calcifications are noted  in the left-greater-than-right V4 segment. The bilateral picas are patent. The basilar artery is patent. The superior cerebellar arteries are visualized. The bilateral PCAs are patent. The right posterior communicating artery is not visualized. A diminutive left posterior communicating artery is suspected. Venous sinuses:  Unable to evaluate. Anatomic variants: None significant Review of the MIP images confirms the above findings IMPRESSION: 1. Complete occlusion of the right ICA, just distal to the bifurcation, which extends through the petrous and cavernous intracranial ICA segments, with reconstitution in the right supraclinoid ICA, likely retrograde. This is of indeterminate acuity, likely chronic. 2. Severe focal stenosis in the proximal left ICA, with minimal flow in the distal left extracranial ICA and petrous and cavernous intracranial left ICA, also likely chronic. 3. Minimal contrast is seen in the distal ACAs and MCAs, which limits evaluation. 4. Grossly patent posterior circulation. 5. Emphysema and interlobular septal thickening in the imaged lungs, with ground-glass opacities, concerning for pulmonary edema. Correlate with BNP. 6. Severe atherosclerotic disease in the imaged aorta, extending into the branch vessels, without significant proximal stenosis. These results were called by telephone at the time of interpretation on 02/15/2021 at 11:14m to provider [facility] , who verbally acknowledged these results. Electronically Signed   By: Merilyn Baba M.D.   On: 02/15/2021 23:43   DG Chest 1 View  Result Date: 02/15/2021 CLINICAL DATA:  Fall. EXAM: CHEST  1 VIEW COMPARISON:  Chest x-ray 10/18/2020. FINDINGS: There are atherosclerotic calcifications of the aorta. The cardiac silhouette is mildly enlarged, unchanged. There are patchy airspace opacities in the left lower lung. There is atelectasis in the right mid lung. There is no pleural effusion or pneumothorax. No acute fractures are seen. IMPRESSION: 1. Patchy left lower lobe airspace disease worrisome for infection. 2. Stable mild cardiomegaly. Electronically Signed   By: Ronney Asters M.D.   On: 02/15/2021 15:35   DG Abd 1 View  Result Date: 02/16/2021 CLINICAL DATA:  Abdominal pain EXAM: ABDOMEN - 1 VIEW COMPARISON:  None. FINDINGS: The bowel gas  pattern is normal. Moderate amount of retained colonic stool suggesting constipation. No radio-opaque calculi or other significant radiographic abnormality are seen. IMPRESSION: Moderate amount of retained colonic stool suggesting constipation. Nonobstructive bowel gas pattern. Electronically Signed   By: Keane Police D.O.   On: 02/16/2021 13:25   CT Head Wo Contrast  Result Date: 02/15/2021 CLINICAL DATA:  Head trauma, minor (Age >= 65y) Neuro deficit, acute, stroke suspected EXAM: CT HEAD WITHOUT CONTRAST TECHNIQUE: Contiguous axial images were obtained from the base of the skull through the vertex without intravenous contrast. COMPARISON:  Head CT 10/17/2020 FINDINGS: Brain: No evidence of acute intracranial hemorrhage or extra-axial collection.No evidence of mass lesion/concern mass effect.The ventricles are normal in size.Scattered subcortical and periventricular white matter hypodensities, nonspecific but likely sequela of chronic small vessel ischemic disease. Vascular: No hyperdense vessel or unexpected calcification. Skull: Normal. Negative for fracture or focal lesion. Sinuses/Orbits: No acute finding. Other: None. IMPRESSION: No acute intracranial abnormality. Unchanged sequela of chronic small vessel ischemic disease. Electronically Signed   By: Maurine Simmering M.D.   On: 02/15/2021 15:19   CT Chest Wo Contrast  Result Date: 02/15/2021 CLINICAL DATA:  Patchy airspace disease in the left base on recent chest x-ray EXAM: CT CHEST WITHOUT CONTRAST TECHNIQUE: Multidetector CT imaging of the chest was performed following the standard protocol without IV contrast. COMPARISON:  Chest x-ray from earlier in the same day, CT from 12/23/2019 FINDINGS: Cardiovascular: Limited due to lack of  IV contrast. Diffuse atherosclerotic calcifications of the thoracic aorta are noted. No aneurysmal dilatation is seen. No cardiac enlargement is noted. Coronary calcifications are seen. No pericardial effusion is noted.  Mediastinum/Nodes: Thoracic inlet shows no acute abnormality. No sizable hilar or mediastinal adenopathy is noted. The esophagus as visualized is within normal limits. Lungs/Pleura: Emphysematous changes are seen bilaterally. Left basilar consolidation is noted consistent with early infiltrate. No associated effusion is noted. No sizable parenchymal nodules are seen. Mild atelectatic changes are noted in the right lower lobe. Upper Abdomen: Gallbladder has been surgically removed. The remainder of the upper abdomen appears within normal limits. Musculoskeletal: Degenerative changes of the thoracic spine are noted. No acute rib abnormality is seen. Chronic L1 compression deformity is noted. IMPRESSION: Patchy consolidation in the left lower lobe similar to that seen on recent chest x-ray consistent with acute infiltrate. Mild basilar atelectasis on the right. Aortic Atherosclerosis (ICD10-I70.0) and Emphysema (ICD10-J43.9). Electronically Signed   By: Inez Catalina M.D.   On: 02/15/2021 17:43   MR Brain Wo Contrast (neuro protocol)  Result Date: 02/15/2021 CLINICAL DATA:  Stroke suspected, neuro deficit EXAM: MRI HEAD WITHOUT CONTRAST TECHNIQUE: Multiplanar, multiecho pulse sequences of the brain and surrounding structures were obtained without intravenous contrast. COMPARISON:  No prior MRI, correlation is made with CT head 02/15/2021 FINDINGS: Evaluation is limited by motion artifact, which renders several sequences nondiagnostic. In addition the patient could not complete the exam. Brain: Small foci of restricted diffusion in the right anterior frontal lobe (series 5, image 79), right anterior parietal lobe (series 5, image 82-83), left parietal lobe (series 5, image 75), and right internal capsule (series 5, image 72), with additional possible punctate foci of restricted diffusion in the anterior left parietal lobe (series 5, image 83). No definite acute hemorrhage, mass, mass effect, or midline shift,  although remaining sequences are significantly limited by motion artifact. T2 hyperintense signal in the periventricular white matter, likely the sequela of chronic small vessel ischemic disease. Vascular: Normal flow voids. Skull and upper cervical spine: No definite marrow signal abnormality. Sinuses/Orbits: Negative.  Status post bilateral lens replacements. Other: The mastoids are well aerated. IMPRESSION: Evaluation is limited by motion artifact, which renders several sequences nondiagnostic. Within this limitation, scattered small foci of restricted diffusion in the bilateral parietal lobes, right frontal lobe, and right internal capsule, most likely acute and/or infarcts. Given multiple vascular territories, an embolic etiology is suspected. Electronically Signed   By: Merilyn Baba M.D.   On: 02/15/2021 20:30   DG CHEST PORT 1 VIEW  Result Date: 02/17/2021 CLINICAL DATA:  Wheezing, altered mental status EXAM: PORTABLE CHEST 1 VIEW COMPARISON:  02/15/2021 FINDINGS: Cardiomegaly with mild, diffuse interstitial pulmonary opacity, unchanged. Elevation of the left hemidiaphragm unchanged. No new or focal airspace opacity. Osseous structures are unremarkable. IMPRESSION: Cardiomegaly with mild, diffuse interstitial pulmonary opacity, unchanged, likely mild edema. No new or focal airspace opacity. Electronically Signed   By: Delanna Ahmadi M.D.   On: 02/17/2021 08:16   EEG adult  Result Date: 02/17/2021 Lora Havens, MD     02/17/2021  4:25 PM Patient Name: VALERIE CONES MRN: [id number] Epilepsy Attending: Lora Havens Referring Physician/Provider: Dr Fransisca Connors Date: 02/17/2021 Duration: 22.34 mins Patient history: 87yo F with ams. EEG to evaluate for seizure. Level of alertness: Awake AEDs during EEG study: None Technical aspects: This EEG study was done with scalp electrodes positioned according to the 10-20 International system of electrode placement. Electrical activity was acquired at  a  sampling rate of 500Hz  and reviewed with a high frequency filter of 70Hz  and a low frequency filter of 1Hz . EEG data were recorded continuously and digitally stored. Description: The posterior dominant rhythm consists of 7.5 Hz activity of moderate voltage (25-35 uV) seen predominantly in posterior head regions, symmetric and reactive to eye opening and eye closing. EEG showed continuous 3 to 6 Hz theta-delta slowing in right hemisphere. Intermittent 3-5Hz  theta-delta slowing in left hemisphere. Sharp transients were seen in right temporal region. Hyperventilation and photic stimulation were not performed.   ABNORMALITY - Continuous slow, right hemisphere - Intermittent slow, left hemisphere IMPRESSION: This study is suggestive of cortical dysfunction arising from right hemisphere,  nonspecific etiology but likely secondary to underlying stroke, post-ictal state. There is also non-specific cortical dysfunction arising from left hemisphere. No seizures or  definite epileptiform discharges were seen throughout the recording. Lora Havens   ECHOCARDIOGRAM COMPLETE  Result Date: 02/16/2021    ECHOCARDIOGRAM REPORT   Patient Name:   EVELEAN BIGLER Date of Exam: 02/16/2021 Medical Rec #:  [id number]    Height:       65.0 in Accession #:    [id number]   Weight:       141.8 lb Date of Birth:  03/03/1931    BSA:          1.709 m Patient Age:    49 years     BP:           133/72 mmHg Patient Gender: F            HR:           76 bpm. Exam Location:  Inpatient Procedure: 2D Echo, Cardiac Doppler and Color Doppler Indications:    Stroke  History:        Patient has no prior history of Echocardiogram examinations.                 CHF, CAD; Risk Factors:Hypertension.  Sonographer:    Jyl Heinz Referring Phys: [id number] Campbell T TU IMPRESSIONS  1. Left ventricular ejection fraction, by estimation, is 60 to 65%. The left ventricle has normal function. The left ventricle has no regional wall motion abnormalities. There is mild  asymmetric left ventricular hypertrophy. Left ventricular diastolic parameters are consistent with Grade I diastolic dysfunction (impaired relaxation).  2. Right ventricular systolic function is normal. The right ventricular size is normal.  3. The mitral valve is grossly normal. No evidence of mitral valve regurgitation. No evidence of mitral stenosis.  4. The aortic valve is tricuspid. Aortic valve regurgitation is not visualized. Comparison(s): No prior Echocardiogram. FINDINGS  Left Ventricle: Left ventricular ejection fraction, by estimation, is 60 to 65%. The left ventricle has normal function. The left ventricle has no regional wall motion abnormalities. The left ventricular internal cavity size was normal in size. There is  mild asymmetric left ventricular hypertrophy. Left ventricular diastolic parameters are consistent with Grade I diastolic dysfunction (impaired relaxation). Right Ventricle: The right ventricular size is normal. No increase in right ventricular wall thickness. Right ventricular systolic function is normal. Left Atrium: Left atrial size was normal in size. Right Atrium: Right atrial size was normal in size. Pericardium: There is no evidence of pericardial effusion. Mitral Valve: The mitral valve is grossly normal. There is mild thickening of the mitral valve leaflet(s). There is mild calcification of the mitral valve leaflet(s). Mild mitral annular calcification. No evidence of mitral valve regurgitation. No evidence of mitral valve  stenosis. Tricuspid Valve: The tricuspid valve is normal in structure. Tricuspid valve regurgitation is trivial. No evidence of tricuspid stenosis. Aortic Valve: The aortic valve is tricuspid. Aortic valve regurgitation is not visualized. Aortic regurgitation PHT measures 457 msec. Aortic valve peak gradient measures 9.1 mmHg. Pulmonic Valve: The pulmonic valve was not well visualized. Pulmonic valve regurgitation is not visualized. No evidence of pulmonic  stenosis. Aorta: The aortic root and ascending aorta are structurally normal, with no evidence of dilitation. IAS/Shunts: The atrial septum is grossly normal.  LEFT VENTRICLE PLAX 2D LVIDd:         3.80 cm     Diastology LVIDs:         2.40 cm     LV e' medial:    3.70 cm/s LV PW:         1.00 cm     LV E/e' medial:  21.1 LV IVS:        1.20 cm     LV e' lateral:   8.59 cm/s LVOT diam:     2.00 cm     LV E/e' lateral: 9.1 LV SV:         65 LV SV Index:   38 LVOT Area:     3.14 cm  LV Volumes (MOD) LV vol d, MOD A2C: 91.0 ml LV vol d, MOD A4C: 76.1 ml LV vol s, MOD A2C: 37.3 ml LV vol s, MOD A4C: 28.3 ml LV SV MOD A2C:     53.7 ml LV SV MOD A4C:     76.1 ml LV SV MOD BP:      53.1 ml RIGHT VENTRICLE            IVC RV Basal diam:  3.20 cm    IVC diam: 1.70 cm RV Mid diam:    2.70 cm RV S prime:     7.40 cm/s TAPSE (M-mode): 1.6 cm LEFT ATRIUM           Index        RIGHT ATRIUM           Index LA diam:      3.70 cm 2.17 cm/m   RA Area:     12.70 cm LA Vol (A2C): 46.2 ml 27.03 ml/m  RA Volume:   28.60 ml  16.74 ml/m LA Vol (A4C): 32.2 ml 18.84 ml/m  AORTIC VALVE AV Area (Vmax): 2.12 cm AV Vmax:        151.00 cm/s AV Peak Grad:   9.1 mmHg LVOT Vmax:      102.00 cm/s LVOT Vmean:     68.300 cm/s LVOT VTI:       0.208 m AI PHT:         457 msec  AORTA Ao Root diam: 3.20 cm Ao Asc diam:  2.90 cm MITRAL VALVE MV Area (PHT): 3.46 cm     SHUNTS MV Decel Time: 219 msec     Systemic VTI:  0.21 m MV E velocity: 78.20 cm/s   Systemic Diam: 2.00 cm MV A velocity: 116.00 cm/s MV E/A ratio:  0.67 Rudean Haskell MD Electronically signed by Rudean Haskell MD Signature Date/Time: 02/16/2021/2:46:14 PM    Final    DG HIP UNILAT WITH PELVIS 2-3 VIEWS LEFT  Result Date: 02/15/2021 CLINICAL DATA:  Four falls this week.  Bilateral hip pain. EXAM: DG HIP (WITH OR WITHOUT PELVIS) 2-3V LEFT COMPARISON:  None. FINDINGS: No fracture or bone lesion. Hip joint normally spaced and aligned. Minor marginal  osteophytes from the  base of the superior femoral head. No other degenerative/arthropathic change. Dense atherosclerotic calcification along the iliac and femoral arteries. IMPRESSION: 1. No fracture or dislocation.  No acute finding. Electronically Signed   By: Lajean Manes M.D.   On: 02/15/2021 16:13   DG HIP UNILAT WITH PELVIS 2-3 VIEWS RIGHT  Result Date: 02/15/2021 CLINICAL DATA:  Four falls this week.  Pain. EXAM: DG HIP (WITH OR WITHOUT PELVIS) 2-3V RIGHT COMPARISON:  None. FINDINGS: Degenerative changes in the right hip without significant loss of joint space. No right hip fracture identified. Evaluation of the left hip is limited due to internal rotation but no fracture is seen. No other acute abnormalities. IMPRESSION: No right hip fracture identified. Evaluation left hip is limited as above. Electronically Signed   By: Dorise Bullion III M.D.   On: 02/15/2021 15:39     Subjective: - no chest pain, shortness of breath, no abdominal pain, nausea or vomiting.   Discharge Exam: BP (!) 163/82 (BP Location: Right Arm)    Pulse 95    Temp (!) 100.8 F (38.2 C)    Resp 20    Ht 5\' 5"  (1.651 m)    Wt 64.3 kg    SpO2 95%    BMI 23.59 kg/m   General: Not responsive to me   The results of significant diagnostics from this hospitalization (including imaging, microbiology, ancillary and laboratory) are listed below for reference.     Microbiology: Recent Results (from the past 240 hour(s))  Resp Panel by RT-PCR (Flu A&B, Covid) Nasopharyngeal Swab     Status: None   Collection Time: 02/15/21  2:41 PM   Specimen: Nasopharyngeal Swab; Nasopharyngeal(NP) swabs in vial transport medium  Result Value Ref Range Status   SARS Coronavirus 2 by RT PCR NEGATIVE NEGATIVE Final    Comment: (NOTE) SARS-CoV-2 target nucleic acids are NOT DETECTED.  The SARS-CoV-2 RNA is generally detectable in upper respiratory specimens during the acute phase of infection. The lowest concentration of SARS-CoV-2 viral copies this  assay can detect is 138 copies/mL. A negative result does not preclude SARS-Cov-2 infection and should not be used as the sole basis for treatment or other patient management decisions. A negative result may occur with  improper specimen collection/handling, submission of specimen other than nasopharyngeal swab, presence of viral mutation(s) within the areas targeted by this assay, and inadequate number of viral copies(<138 copies/mL). A negative result must be combined with clinical observations, patient history, and epidemiological information. The expected result is Negative.  Fact Sheet for Patients:  EntrepreneurPulse.com.au  Fact Sheet for Healthcare Providers:  IncredibleEmployment.be  This test is no t yet approved or cleared by the [place] FDA and  has been authorized for detection and/or diagnosis of SARS-CoV-2 by FDA under an Emergency Use Authorization (EUA). This EUA will remain  in effect (meaning this test can be used) for the duration of the COVID-19 declaration under Section 564(b)(1) of the Act, 21 U.S.C.section 360bbb-3(b)(1), unless the authorization is terminated  or revoked sooner.       Influenza A by PCR NEGATIVE NEGATIVE Final   Influenza B by PCR NEGATIVE NEGATIVE Final    Comment: (NOTE) The Xpert Xpress SARS-CoV-2/FLU/RSV plus assay is intended as an aid in the diagnosis of influenza from Nasopharyngeal swab specimens and should not be used as a sole basis for treatment. Nasal washings and aspirates are unacceptable for Xpert Xpress SARS-CoV-2/FLU/RSV testing.  Fact Sheet for Patients: EntrepreneurPulse.com.au  Fact Sheet  for Healthcare Providers: IncredibleEmployment.be  This test is not yet approved or cleared by the [place] and has been authorized for detection and/or diagnosis of SARS-CoV-2 by FDA under an Emergency Use Authorization (EUA). This EUA will  remain in effect (meaning this test can be used) for the duration of the COVID-19 declaration under Section 564(b)(1) of the Act, 21 U.S.C. section 360bbb-3(b)(1), unless the authorization is terminated or revoked.  Performed at [facility], [place] [address] [place], [place] [address]   Culture, blood (Routine X 2) w Reflex to ID Panel     Status: None (Preliminary result)   Collection Time: 02/15/21  6:20 PM   Specimen: BLOOD LEFT ARM  Result Value Ref Range Status   Specimen Description BLOOD LEFT ARM  Final   Special Requests   Final    BOTTLES DRAWN AEROBIC AND ANAEROBIC Blood Culture results may not be optimal due to an inadequate volume of blood received in culture bottles   Culture   Final    NO GROWTH 3 DAYS Performed at [facility], [place] [address] [place],  [address]    Report Status PENDING  Incomplete     Labs: Basic Metabolic Panel: Recent Labs  Lab 02/15/21 1439 02/17/21 0518  NA 133* 134*  K 4.2 3.9  CL 99 101  CO2 26 22  GLUCOSE 136* 161*  BUN 20 13  CREATININE 0.90 0.79  CALCIUM 9.2 8.3*   Liver Function Tests: Recent Labs  Lab 02/15/21 1439  AST 29  ALT 17  ALKPHOS 43  BILITOT 0.7  PROT 6.8  ALBUMIN 3.0*   CBC: Recent Labs  Lab 02/15/21 1439 02/17/21 0518  WBC 6.9 9.9  NEUTROABS 5.0 7.9*  HGB 10.8* 10.4*  HCT 34.1* 31.8*  MCV 107.2* 105.0*  PLT 249 284   CBG: No results for input(s): GLUCAP in the last 168 hours. Hgb A1c No results for input(s): HGBA1C in the last 72 hours. Lipid Profile No results for input(s): CHOL, HDL, LDLCALC, TRIG, CHOLHDL, LDLDIRECT in the last 72 hours. Thyroid function studies No results for input(s): TSH, T4TOTAL, T3FREE, THYROIDAB in the last 72 hours.  Invalid input(s): FREET3 Urinalysis    Component Value Date/Time   COLORURINE YELLOW 02/15/2021 [place] 02/15/2021 1439   LABSPEC 1.025 02/15/2021 1439   PHURINE 5.5 02/15/2021 1439   GLUCOSEU NEGATIVE  02/15/2021 1439   HGBUR MODERATE (A) 02/15/2021 1439   BILIRUBINUR NEGATIVE 02/15/2021 1439   BILIRUBINUR 1+ 04/11/2018 1436   KETONESUR 15 (A) 02/15/2021 1439   PROTEINUR 100 (A) 02/15/2021 1439   UROBILINOGEN 0.2 04/11/2018 1436   UROBILINOGEN 0.2 03/02/2008 1545   NITRITE NEGATIVE 02/15/2021 1439   LEUKOCYTESUR NEGATIVE 02/15/2021 1439    FURTHER DISCHARGE INSTRUCTIONS:   Get Medicines reviewed and adjusted: Please take all your medications with you for your next visit with your Primary MD   Laboratory/radiological data: Please request your Primary MD to go over all hospital tests and procedure/radiological results at the follow up, please ask your Primary MD to get all Hospital records sent to his/her office.   In some cases, they will be blood work, cultures and biopsy results pending at the time of your discharge. Please request that your primary care M.D. goes through all the records of your hospital data and follows up on these results.   Also Note the following: If you experience worsening of your admission symptoms, develop shortness of breath, life threatening emergency,  suicidal or homicidal thoughts you must seek medical attention immediately by calling 911 or calling your MD immediately  if symptoms less severe.   You must read complete instructions/literature along with all the possible adverse reactions/side effects for all the Medicines you take and that have been prescribed to you. Take any new Medicines after you have completely understood and accpet all the possible adverse reactions/side effects.    Do not drive when taking Pain medications or sleeping medications (Benzodaizepines)   Do not take more than prescribed Pain, Sleep and Anxiety Medications. It is not advisable to combine anxiety,sleep and pain medications without talking with your primary care practitioner   Special Instructions: If you have smoked or chewed Tobacco  in the last 2 yrs please stop  smoking, stop any regular Alcohol  and or any Recreational drug use.   Wear Seat belts while driving.   Please note: You were cared for by a hospitalist during your hospital stay. Once you are discharged, your primary care physician will handle any further medical issues. Please note that NO REFILLS for any discharge medications will be authorized once you are discharged, as it is imperative that you return to your primary care physician (or establish a relationship with a primary care physician if you do not have one) for your post hospital discharge needs so that they can reassess your need for medications and monitor your lab values.  Time coordinating discharge: 25 minutes  SIGNED:  Marzetta Board, MD, PhD 02/19/2021, 10:35 AM

## 2021-02-19 NOTE — Plan of Care (Signed)
°  Problem: Education: Goal: Knowledge of General Education information will improve Description: Including pain rating scale, medication(s)/side effects and non-pharmacologic comfort measures Outcome: Progressing   Problem: Health Behavior/Discharge Planning: Goal: Ability to manage health-related needs will improve Outcome: Progressing   Problem: Clinical Measurements: Goal: Ability to maintain clinical measurements within normal limits will improve Outcome: Progressing Goal: Will remain free from infection Outcome: Progressing Goal: Diagnostic test results will improve Outcome: Progressing Goal: Cardiovascular complication will be avoided Outcome: Progressing   Problem: Nutrition: Goal: Adequate nutrition will be maintained Outcome: Not Progressing   Problem: Coping: Goal: Level of anxiety will decrease Outcome: Progressing   Problem: Elimination: Goal: Will not experience complications related to bowel motility Outcome: Progressing Goal: Will not experience complications related to urinary retention Outcome: Progressing   Problem: Pain Managment: Goal: General experience of comfort will improve Outcome: Progressing   Problem: Safety: Goal: Ability to remain free from injury will improve Outcome: Progressing   Problem: Skin Integrity: Goal: Risk for impaired skin integrity will decrease Outcome: Progressing

## 2021-02-19 NOTE — Care Management Important Message (Signed)
Important Message  Patient Details  Name: Cindy Brandt MRN: [id number] Date of Birth: 1931-08-01   Medicare Important Message Given:  Yes  CORRECTION  patient discharged prior to IM delivery will mail to the patient home address.   [name] 02/19/2021, 3:55 PM

## 2021-02-20 ENCOUNTER — Other Ambulatory Visit: Payer: Self-pay | Admitting: Primary Care

## 2021-02-20 DIAGNOSIS — E785 Hyperlipidemia, unspecified: Secondary | ICD-10-CM

## 2021-02-20 LAB — CULTURE, BLOOD (ROUTINE X 2): Culture: NO GROWTH

## 2021-02-25 NOTE — Telephone Encounter (Signed)
I just sent a message to her daughter. I don't think anything further is needed, thanks for the follow up!

## 2021-02-25 NOTE — Telephone Encounter (Signed)
Was following up on this looks like patient is transitioning into hospice do we need to do anything further?

## 2021-03-02 ENCOUNTER — Other Ambulatory Visit: Payer: Self-pay | Admitting: Primary Care

## 2021-03-02 DIAGNOSIS — F411 Generalized anxiety disorder: Secondary | ICD-10-CM

## 2021-03-02 NOTE — Telephone Encounter (Signed)
Will you find out if she is still taking sertraline (Zoloft) for depression? It looks like she's on hospice care now.   This was discontinued by a hospitalist.

## 2021-03-02 DEATH — deceased

## 2021-03-24 ENCOUNTER — Telehealth: Payer: Self-pay

## 2021-03-24 NOTE — Telephone Encounter (Signed)
Erroneous encounter

## 2021-04-04 ENCOUNTER — Telehealth: Payer: Medicare Other

## 2021-04-10 ENCOUNTER — Ambulatory Visit: Payer: Self-pay

## 2021-04-10 DIAGNOSIS — J449 Chronic obstructive pulmonary disease, unspecified: Secondary | ICD-10-CM

## 2021-04-10 DIAGNOSIS — R296 Repeated falls: Secondary | ICD-10-CM

## 2021-04-10 NOTE — Chronic Care Management (AMB) (Signed)
°  Care Management   Follow Up Note   04/10/2021 Name: Cindy Brandt MRN: [id number] DOB: 04/04/1931   Referred by: [name], NP Reason for referral : No chief complaint on file.   Patient deceased.   Quinn Plowman RN,BSN,CCM RN Case Manager [place]  [phone]
